# Patient Record
Sex: Female | Born: 1965 | Race: White | Hispanic: No | Marital: Married | Smoking: Never smoker
Health system: Southern US, Community
[De-identification: ages and names within clinical notes are randomized; demographics above are authoritative.]

## PROBLEM LIST (undated history)

## (undated) DIAGNOSIS — E669 Obesity, unspecified: Secondary | ICD-10-CM

## (undated) DIAGNOSIS — F419 Anxiety disorder, unspecified: Secondary | ICD-10-CM

## (undated) DIAGNOSIS — E538 Deficiency of other specified B group vitamins: Secondary | ICD-10-CM

## (undated) DIAGNOSIS — R112 Nausea with vomiting, unspecified: Secondary | ICD-10-CM

## (undated) DIAGNOSIS — K589 Irritable bowel syndrome without diarrhea: Secondary | ICD-10-CM

## (undated) DIAGNOSIS — F32A Depression, unspecified: Secondary | ICD-10-CM

## (undated) DIAGNOSIS — E119 Type 2 diabetes mellitus without complications: Secondary | ICD-10-CM

## (undated) DIAGNOSIS — F329 Major depressive disorder, single episode, unspecified: Secondary | ICD-10-CM

## (undated) DIAGNOSIS — E785 Hyperlipidemia, unspecified: Secondary | ICD-10-CM

## (undated) DIAGNOSIS — G43909 Migraine, unspecified, not intractable, without status migrainosus: Secondary | ICD-10-CM

## (undated) DIAGNOSIS — C52 Malignant neoplasm of vagina: Secondary | ICD-10-CM

## (undated) DIAGNOSIS — I10 Essential (primary) hypertension: Secondary | ICD-10-CM

## (undated) DIAGNOSIS — T7840XA Allergy, unspecified, initial encounter: Secondary | ICD-10-CM

## (undated) DIAGNOSIS — Z923 Personal history of irradiation: Secondary | ICD-10-CM

## (undated) DIAGNOSIS — E559 Vitamin D deficiency, unspecified: Secondary | ICD-10-CM

## (undated) DIAGNOSIS — E739 Lactose intolerance, unspecified: Secondary | ICD-10-CM

## (undated) DIAGNOSIS — R0602 Shortness of breath: Secondary | ICD-10-CM

## (undated) DIAGNOSIS — Z9889 Other specified postprocedural states: Secondary | ICD-10-CM

## (undated) HISTORY — DX: Hyperlipidemia, unspecified: E78.5

## (undated) HISTORY — DX: Type 2 diabetes mellitus without complications: E11.9

## (undated) HISTORY — DX: Irritable bowel syndrome, unspecified: K58.9

## (undated) HISTORY — DX: Depression, unspecified: F32.A

## (undated) HISTORY — DX: Essential (primary) hypertension: I10

## (undated) HISTORY — DX: Deficiency of other specified B group vitamins: E53.8

## (undated) HISTORY — DX: Allergy, unspecified, initial encounter: T78.40XA

## (undated) HISTORY — PX: ABDOMINAL HYSTERECTOMY: SHX81

## (undated) HISTORY — DX: Lactose intolerance, unspecified: E73.9

## (undated) HISTORY — DX: Major depressive disorder, single episode, unspecified: F32.9

## (undated) HISTORY — DX: Vitamin D deficiency, unspecified: E55.9

## (undated) HISTORY — DX: Obesity, unspecified: E66.9

## (undated) HISTORY — DX: Anxiety disorder, unspecified: F41.9

## (undated) HISTORY — PX: KNEE SURGERY: SHX244

## (undated) HISTORY — PX: ESOPHAGOGASTRODUODENOSCOPY: SHX1529

---

## 1997-10-14 ENCOUNTER — Emergency Department (HOSPITAL_COMMUNITY): Admission: EM | Admit: 1997-10-14 | Discharge: 1997-10-14 | Payer: Self-pay | Admitting: Emergency Medicine

## 1997-12-18 ENCOUNTER — Ambulatory Visit (HOSPITAL_COMMUNITY): Admission: RE | Admit: 1997-12-18 | Discharge: 1997-12-18 | Payer: Self-pay | Admitting: Obstetrics & Gynecology

## 2000-02-10 ENCOUNTER — Encounter: Admission: RE | Admit: 2000-02-10 | Discharge: 2000-02-10 | Payer: Self-pay | Admitting: Obstetrics & Gynecology

## 2000-02-10 ENCOUNTER — Encounter: Payer: Self-pay | Admitting: Obstetrics & Gynecology

## 2000-03-02 ENCOUNTER — Observation Stay (HOSPITAL_COMMUNITY): Admission: AD | Admit: 2000-03-02 | Discharge: 2000-03-03 | Payer: Self-pay | Admitting: Obstetrics & Gynecology

## 2000-03-02 ENCOUNTER — Encounter (INDEPENDENT_AMBULATORY_CARE_PROVIDER_SITE_OTHER): Payer: Self-pay

## 2000-06-11 ENCOUNTER — Other Ambulatory Visit: Admission: RE | Admit: 2000-06-11 | Discharge: 2000-06-11 | Payer: Self-pay | Admitting: Family Medicine

## 2001-06-25 ENCOUNTER — Encounter (INDEPENDENT_AMBULATORY_CARE_PROVIDER_SITE_OTHER): Payer: Self-pay | Admitting: Specialist

## 2001-06-25 ENCOUNTER — Observation Stay (HOSPITAL_COMMUNITY): Admission: RE | Admit: 2001-06-25 | Discharge: 2001-06-26 | Payer: Self-pay | Admitting: Obstetrics & Gynecology

## 2001-12-25 ENCOUNTER — Encounter: Admission: RE | Admit: 2001-12-25 | Discharge: 2001-12-25 | Payer: Self-pay | Admitting: *Deleted

## 2001-12-25 ENCOUNTER — Encounter: Payer: Self-pay | Admitting: *Deleted

## 2002-08-12 ENCOUNTER — Encounter: Admission: RE | Admit: 2002-08-12 | Discharge: 2002-08-12 | Payer: Self-pay

## 2002-09-15 ENCOUNTER — Other Ambulatory Visit: Admission: RE | Admit: 2002-09-15 | Discharge: 2002-09-15 | Payer: Self-pay | Admitting: Obstetrics & Gynecology

## 2002-12-08 ENCOUNTER — Encounter: Admission: RE | Admit: 2002-12-08 | Discharge: 2002-12-08 | Payer: Self-pay | Admitting: Family Medicine

## 2002-12-08 ENCOUNTER — Encounter: Payer: Self-pay | Admitting: Family Medicine

## 2003-10-19 ENCOUNTER — Other Ambulatory Visit: Admission: RE | Admit: 2003-10-19 | Discharge: 2003-10-19 | Payer: Self-pay | Admitting: Obstetrics & Gynecology

## 2004-07-18 ENCOUNTER — Ambulatory Visit: Payer: Self-pay | Admitting: Internal Medicine

## 2004-07-25 ENCOUNTER — Ambulatory Visit: Payer: Self-pay | Admitting: Internal Medicine

## 2004-08-08 ENCOUNTER — Ambulatory Visit: Payer: Self-pay

## 2004-10-17 ENCOUNTER — Ambulatory Visit: Payer: Self-pay | Admitting: Internal Medicine

## 2004-11-02 ENCOUNTER — Ambulatory Visit: Payer: Self-pay | Admitting: Cardiology

## 2004-11-02 ENCOUNTER — Ambulatory Visit: Payer: Self-pay | Admitting: Internal Medicine

## 2004-11-14 ENCOUNTER — Other Ambulatory Visit: Admission: RE | Admit: 2004-11-14 | Discharge: 2004-11-14 | Payer: Self-pay | Admitting: Obstetrics & Gynecology

## 2004-11-21 ENCOUNTER — Encounter: Admission: RE | Admit: 2004-11-21 | Discharge: 2004-11-21 | Payer: Self-pay | Admitting: Obstetrics & Gynecology

## 2005-03-13 ENCOUNTER — Ambulatory Visit: Payer: Self-pay | Admitting: Internal Medicine

## 2005-04-06 ENCOUNTER — Ambulatory Visit: Payer: Self-pay | Admitting: Internal Medicine

## 2005-04-17 ENCOUNTER — Ambulatory Visit: Payer: Self-pay | Admitting: Cardiology

## 2005-08-14 ENCOUNTER — Emergency Department (HOSPITAL_COMMUNITY): Admission: EM | Admit: 2005-08-14 | Discharge: 2005-08-14 | Payer: Self-pay | Admitting: Emergency Medicine

## 2005-08-22 ENCOUNTER — Ambulatory Visit: Payer: Self-pay | Admitting: Internal Medicine

## 2005-12-18 ENCOUNTER — Ambulatory Visit: Payer: Self-pay | Admitting: Internal Medicine

## 2006-03-19 ENCOUNTER — Ambulatory Visit: Payer: Self-pay | Admitting: Internal Medicine

## 2006-08-29 ENCOUNTER — Encounter: Admission: RE | Admit: 2006-08-29 | Discharge: 2006-08-29 | Payer: Self-pay | Admitting: Gastroenterology

## 2006-10-09 ENCOUNTER — Ambulatory Visit (HOSPITAL_COMMUNITY): Admission: RE | Admit: 2006-10-09 | Discharge: 2006-10-09 | Payer: Self-pay | Admitting: Obstetrics & Gynecology

## 2007-08-16 ENCOUNTER — Encounter: Payer: Self-pay | Admitting: Internal Medicine

## 2007-11-11 ENCOUNTER — Encounter: Payer: Self-pay | Admitting: Internal Medicine

## 2008-11-02 ENCOUNTER — Encounter: Admission: RE | Admit: 2008-11-02 | Discharge: 2008-11-02 | Payer: Self-pay | Admitting: Obstetrics & Gynecology

## 2008-11-04 ENCOUNTER — Encounter: Payer: Self-pay | Admitting: Internal Medicine

## 2009-03-19 ENCOUNTER — Telehealth (INDEPENDENT_AMBULATORY_CARE_PROVIDER_SITE_OTHER): Payer: Self-pay | Admitting: *Deleted

## 2009-06-11 ENCOUNTER — Telehealth: Payer: Self-pay | Admitting: Internal Medicine

## 2009-06-29 ENCOUNTER — Encounter: Payer: Self-pay | Admitting: Internal Medicine

## 2009-06-29 ENCOUNTER — Ambulatory Visit: Payer: Self-pay | Admitting: Internal Medicine

## 2009-06-29 DIAGNOSIS — E669 Obesity, unspecified: Secondary | ICD-10-CM

## 2009-06-29 DIAGNOSIS — R0602 Shortness of breath: Secondary | ICD-10-CM

## 2009-06-29 DIAGNOSIS — D179 Benign lipomatous neoplasm, unspecified: Secondary | ICD-10-CM | POA: Insufficient documentation

## 2009-06-29 DIAGNOSIS — F4321 Adjustment disorder with depressed mood: Secondary | ICD-10-CM

## 2009-06-29 DIAGNOSIS — G43909 Migraine, unspecified, not intractable, without status migrainosus: Secondary | ICD-10-CM

## 2009-06-30 DIAGNOSIS — F411 Generalized anxiety disorder: Secondary | ICD-10-CM

## 2009-06-30 DIAGNOSIS — F419 Anxiety disorder, unspecified: Secondary | ICD-10-CM | POA: Insufficient documentation

## 2009-06-30 DIAGNOSIS — F329 Major depressive disorder, single episode, unspecified: Secondary | ICD-10-CM

## 2009-07-09 ENCOUNTER — Ambulatory Visit: Payer: Self-pay | Admitting: Internal Medicine

## 2009-07-09 LAB — CONVERTED CEMR LAB
ALT: 33 units/L (ref 0–35)
Alkaline Phosphatase: 58 units/L (ref 39–117)
Basophils Relative: 0.3 % (ref 0.0–3.0)
Bilirubin, Direct: 0.1 mg/dL (ref 0.0–0.3)
Chloride: 107 meq/L (ref 96–112)
Eosinophils Absolute: 0 10*3/uL (ref 0.0–0.7)
GFR calc non Af Amer: 95.87 mL/min (ref >60)
HCT: 40.6 % (ref 36.0–46.0)
Hemoglobin: 13.4 g/dL (ref 12.0–15.0)
Leukocytes, UA: NEGATIVE
Lymphocytes Relative: 26.8 % (ref 12.0–46.0)
MCHC: 33 g/dL (ref 30.0–36.0)
MCV: 93 fL (ref 78.0–100.0)
Monocytes Absolute: 0.4 10*3/uL (ref 0.1–1.0)
Neutrophils Relative %: 65.8 % (ref 43.0–77.0)
Nitrite: NEGATIVE
Platelets: 197 10*3/uL (ref 150.0–400.0)
RBC: 4.36 M/uL (ref 3.87–5.11)
RDW: 12 % (ref 11.5–14.6)
Sodium: 137 meq/L (ref 135–145)
TSH: 1.18 microintl units/mL (ref 0.35–5.50)
Total Bilirubin: 0.5 mg/dL (ref 0.3–1.2)
Total CHOL/HDL Ratio: 4
Total Protein: 7.3 g/dL (ref 6.0–8.3)
Urine Glucose: NEGATIVE mg/dL
WBC: 5.6 10*3/uL (ref 4.5–10.5)

## 2009-08-12 ENCOUNTER — Telehealth: Payer: Self-pay | Admitting: Internal Medicine

## 2009-08-25 ENCOUNTER — Ambulatory Visit: Payer: Self-pay | Admitting: Internal Medicine

## 2009-09-06 ENCOUNTER — Encounter: Payer: Self-pay | Admitting: Internal Medicine

## 2009-09-06 HISTORY — PX: NM MYOCAR PERF WALL MOTION: HXRAD629

## 2009-12-02 ENCOUNTER — Ambulatory Visit: Payer: Self-pay | Admitting: Internal Medicine

## 2009-12-02 DIAGNOSIS — J45909 Unspecified asthma, uncomplicated: Secondary | ICD-10-CM

## 2009-12-02 DIAGNOSIS — J309 Allergic rhinitis, unspecified: Secondary | ICD-10-CM | POA: Insufficient documentation

## 2010-01-27 ENCOUNTER — Telehealth: Payer: Self-pay | Admitting: Internal Medicine

## 2010-02-11 ENCOUNTER — Encounter: Admission: RE | Admit: 2010-02-11 | Discharge: 2010-02-11 | Payer: Self-pay | Admitting: Obstetrics & Gynecology

## 2010-03-25 ENCOUNTER — Telehealth: Payer: Self-pay | Admitting: Internal Medicine

## 2010-03-29 ENCOUNTER — Telehealth: Payer: Self-pay | Admitting: Internal Medicine

## 2010-04-18 ENCOUNTER — Encounter: Payer: Self-pay | Admitting: Internal Medicine

## 2010-05-16 ENCOUNTER — Ambulatory Visit: Payer: Self-pay | Admitting: Internal Medicine

## 2010-05-20 ENCOUNTER — Telehealth: Payer: Self-pay | Admitting: Internal Medicine

## 2010-05-31 ENCOUNTER — Telehealth: Payer: Self-pay | Admitting: Internal Medicine

## 2010-06-19 ENCOUNTER — Encounter: Payer: Self-pay | Admitting: Orthopedic Surgery

## 2010-06-30 NOTE — Progress Notes (Signed)
Summary: REQ FOR ABX  Phone Note Call from Patient Call back at Home Phone [phone]   Summary of Call: Pt "always gets a sinus infection" this time of year. She has no tranportation to the office. Patient is requesting rx for levaquin.  Initial call taken by: Lamar Sprinkles, CMA,  August 12, 2009 9:14 AM  Follow-up for Phone Call        ok OV if too sick Follow-up by: Tresa Garter MD,  August 12, 2009 1:09 PM  Additional Follow-up for Phone Call Additional follow up Details #1::        pt callled and wants to know status - if prescription was sent to her pharmacy - pls let her know. Additional Follow-up by: Verdell Face,  August 12, 2009 3:45 PM    Additional Follow-up for Phone Call Additional follow up Details #2::    Pt informed  Follow-up by: Lamar Sprinkles, CMA,  August 12, 2009 4:48 PM  New/Updated Medications: LEVAQUIN 500 MG TABS (LEVOFLOXACIN) 1 by mouth qd Prescriptions: LEVAQUIN 500 MG TABS (LEVOFLOXACIN) 1 by mouth qd  #10 x 0   Entered and Authorized by:   Tresa Garter MD   Signed by:   Lamar Sprinkles, CMA on 08/12/2009   Method used:   [facility]        [facility] [address] [phone]* (retail)       [address]       [place], [place]  [address]       Ph: [phone] or [phone]       Fax: [phone]   RxID:   [id number]

## 2010-06-30 NOTE — Assessment & Plan Note (Signed)
Summary: sinus inf?/plot pt/cd   Vital Signs:  Patient profile:   45 year old female Height:      60.5 inches Weight:      185.38 pounds BMI:     35.74 O2 Sat:      97 % on Room air Temp:     98.4 degrees F oral Pulse rate:   72 / minute BP sitting:   120 / 70  (left arm) Cuff size:   regular  Vitals Entered By: Zella Ball Ewing CMA Duncan Dull) (May 16, 2010 4:07 PM)  O2 Flow:  Room air CC: Congestion, ear and throat pain/RE   CC:  Congestion and ear and throat pain/RE.  History of Present Illness: here with actue onset 3 days mild to mod facial pain, pressure, fever and greenish d/c ; with mild ST and non prod cough and right earache without hearing loss, vertigo;  Pt denies CP, worsening sob, doe, wheezing, orthopnea, pnd, worsening LE edema, palps, dizziness or syncope  Pt denies new neuro symptoms such as headache, facial or extremity weakness  Pt denies polydipsia, polyuria   Overall good compliance with meds, trying to follow low chol diet, wt stable, little excercise however    Problems Prior to Update: 1)  Sinusitis- Acute-nos  (ICD-461.9) 2)  Wheezing  (ICD-786.07) 3)  Asthma  (ICD-493.90) 4)  Allergic Rhinitis  (ICD-477.9) 5)  Sinusitis- Acute-nos  (ICD-461.9) 6)  Depression  (ICD-311) 7)  Anxiety  (ICD-300.00) 8)  Lipomas, 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 1 Three Times A Day As Needed 2)  Wellbutrin 100 Mg Tabs (Bupropion Hcl) .... Once Daily 3)  Effexor Xr 150 Mg Xr24h-Cap (Venlafaxine Hcl) .... Two Times A Day 4)  Topamax 200 Mg Tabs (Topiramate) .... Take 1 Tab By Mouth At Bedtime 5)  Avelox 400 Mg Tabs (Moxifloxacin Hcl) .[name].. 1 By Mouth Once Daily 6)  Hydrocodone-Homatropine 5-1.5 Mg/7ml Syrp (Hydrocodone-Homatropine) .[name].. 1 Tsp By Mouth Q 6 Hrs As Needed 7)  Prednisone 10 Mg  Tabs (Prednisone) .... 3po Qd For 3days, Then 2po Qd For 3days, Then 1po Qd For 3days, Then Stop 8)  Proair Hfa 108 (90 Base) Mcg/act Aers (Albuterol Sulfate) .... 2 Puffs Four Times Per Day As Needed  Current Medications (verified): 1)  Alprazolam 0.5 Mg Tabs (Alprazolam) .[name].. 1 Three Times A Day As Needed 2)  Wellbutrin 100 Mg Tabs (Bupropion Hcl) .... Once Daily 3)  Effexor Xr 150 Mg Xr24h-Cap (Venlafaxine Hcl) .... Two Times A Day 4)  Topamax 200 Mg Tabs (Topiramate) .... Take 1 Tab By Mouth At Bedtime 5)  Hydrocodone-Homatropine 5-1.5 Mg/28ml Syrp (Hydrocodone-Homatropine) .[name].. 1 Tsp By Mouth Q 6 Hrs As Needed 6)  Proair Hfa 108 (90 Base) Mcg/act Aers (Albuterol Sulfate) .... 2 Puffs Four Times Per Day As Needed 7)  Levofloxacin 500 Mg Tabs (Levofloxacin) .[name].. 1po Once Daily  Allergies (verified): 1)  ! Penicillin V Potassium (Penicillin V Potassium) 2)  ! Sulfadiazine (Sulfadiazine) 3)  ! Codeine Sulfate (Codeine Sulfate)  Past History:  Past Medical History: Last updated: 12/02/2009 Migraines Obesity Anxiety Depression Allergic rhinitis Asthma  Social History: Last updated: 12/02/2009 Occupation:hair dresser Married Never Smoked Regular exercise-no Drug use-no  Risk Factors: Exercise: no (06/29/2009)  Risk Factors: Smoking Status: never (06/29/2009)  Review of Systems  all otherwise negative per pt -    Physical Exam  General:  alert and overweight-appearing., mild ill    Head:  normocephalic and atraumatic.   Eyes:  vision grossly intact, pupils equal, and pupils round.   Ears:  left tm mild erythema, but right tm normal TM appearance, sinus tender bilat Nose:  nasal dischargemucosal pallor and mucosal edema.   Mouth:  pharyngeal erythema and fair dentition.   Neck:  no masses.  supple.   Lungs:  normal respiratory effort and normal breath sounds.   Heart:  normal rate and regular rhythm.   Extremities:  no edema, no erythema    Impression &  Recommendations:  Problem # 1:  SINUSITIS- ACUTE-NOS (ICD-461.9)  The following medications were removed from the medication list:    Avelox 400 Mg Tabs (Moxifloxacin hcl) .[name]... 1 by mouth once daily Her updated medication list for this problem includes:    Hydrocodone-homatropine 5-1.5 Mg/44ml Syrp (Hydrocodone-homatropine) .[name]... 1 tsp by mouth q 6 hrs as needed    Levofloxacin 500 Mg Tabs (Levofloxacin) .[name]... 1po once daily no wheezing today; treat as above, f/u any worsening signs or symptoms   Complete Medication List: 1)  Alprazolam 0.5 Mg Tabs (Alprazolam) .[name].. 1 three times a day as needed 2)  Wellbutrin 100 Mg Tabs (Bupropion hcl) .... Once daily 3)  Effexor Xr 150 Mg Xr24h-cap (Venlafaxine hcl) .... Two times a day 4)  Topamax 200 Mg Tabs (Topiramate) .... Take 1 tab by mouth at bedtime 5)  Hydrocodone-homatropine 5-1.5 Mg/44ml Syrp (Hydrocodone-homatropine) .[name].. 1 tsp by mouth q 6 hrs as needed 6)  Proair Hfa 108 (90 Base) Mcg/act Aers (Albuterol sulfate) .... 2 puffs four times per day as needed 7)  Levofloxacin 500 Mg Tabs (Levofloxacin) .[name].. 1po once daily  Patient Instructions: 1)  Please take all new medications as prescribed  2)  Continue all previous medications as before this visit  3)  Please schedule an appointment with your primary doctor as needed Prescriptions: LEVOFLOXACIN 500 MG TABS (LEVOFLOXACIN) 1po once daily  #10 x 0   Entered and Authorized by:   Corwin Levins MD   Signed by:   Corwin Levins MD on 05/16/2010   Method used:   Print then Give to Patient   RxID:   [id number] HYDROCODONE-HOMATROPINE 5-1.5 MG/5ML SYRP (HYDROCODONE-HOMATROPINE) 1 tsp by mouth q 6 hrs as needed  #6oz x 1   Entered and Authorized by:   Corwin Levins MD   Signed by:   Corwin Levins MD on 05/16/2010   Method used:   Print then Give to Patient   RxID:   [id number]    Orders Added: 1)  Est. Patient Level III [[address]073]

## 2010-06-30 NOTE — Progress Notes (Signed)
Summary: REQ FOR RX  Phone Note Call from Patient Call back at Work Phone [phone]   Summary of Call: Pt continues to have nasal drainage, sinus congestion, right ear pain and feels she is not over what she was treated for by Dr Jonny Ruiz. Pt was given levaquin previously.  Initial call taken by: Lamar Sprinkles, CMA,  May 31, 2010 3:20 PM  Follow-up for Phone Call        OK to refill Levaquin. OV if not better Follow-up by: Tresa Garter MD,  May 31, 2010 9:24 PM  Additional Follow-up for Phone Call Additional follow up Details #1::        Returned call to pt/ lmovm to check pharmacy and call for office visit if not better.Alvy Beal Archie CMA  June 01, 2010 9:02 AM     Prescriptions: LEVOFLOXACIN 500 MG TABS (LEVOFLOXACIN) 1po once daily  #10 x 0   Entered by:   Rock Nephew CMA   Authorized by:   Tresa Garter MD   Signed by:   Rock Nephew CMA on 06/01/2010   Method used:   Electronically to        [facility] [address] [phone]* (retail)       [address]       [place], [place]  [address]       Ph: [phone] or [phone]       Fax: [phone]   RxID:   [id number]

## 2010-06-30 NOTE — Progress Notes (Signed)
Summary: REQ FOR RX   Phone Note Call from Patient Call back at Work Phone [phone]   Summary of Call: Pt is going out of town tomorrow. She is c/o runny nose, sinus congestion and ear ache. Patient is requesting rx for what she thinks is a sinus infection. Patient is requesting antibiotic.  Initial call taken by: Lamar Sprinkles, CMA,  March 25, 2010 9:51 AM  Follow-up for Phone Call        ok zpac Follow-up by: Tresa Garter MD,  March 25, 2010 1:04 PM  Additional Follow-up for Phone Call Additional follow up Details #1::        Left detailed vm on pt's home # Additional Follow-up by: Lamar Sprinkles, CMA,  March 25, 2010 6:13 PM    New/Updated Medications: ZITHROMAX Z-PAK 250 MG TABS (AZITHROMYCIN) as dirrected Prescriptions: ZITHROMAX Z-PAK 250 MG TABS (AZITHROMYCIN) as dirrected  #1 x 0   Entered and Authorized by:   Tresa Garter MD   Signed by:   Lamar Sprinkles, CMA on 03/25/2010   Method used:   Electronically to        [facility] [address] [phone]* (retail)       [address]       [place], [place]  [address]       Ph: [phone] or [phone]       Fax: [phone]   RxID:   [id number]

## 2010-06-30 NOTE — Progress Notes (Signed)
Summary: sinus infection  Phone Note Call from Patient   Caller: Patient Summary of Call: Pt was given Rx for Zpak for her sinus infection. Sinus infection has not gotten any better, has gotten worse. Wants something stronger. Stated she has been on Levoquin before for sinus infection. Please advise. Callback on [phone]. Initial call taken by: Alysia Penna,  March 29, 2010 3:04 PM  Follow-up for Phone Call        needs ov with any MD Follow-up by: Tresa Garter MD,  March 29, 2010 5:13 PM  Additional Follow-up for Phone Call Additional follow up Details #1::        Pt advised via VM that re-eval is needed if sxs have gotten worse and to call back and sch appt with AVP or any available MD if okay. Additional Follow-up by: Margaret Pyle, CMA,  March 30, 2010 8:27 AM

## 2010-06-30 NOTE — Letter (Signed)
Summary: [facility]   Imported By: Sherian Rein 05/27/2010 08:15:16  _____________________________________________________________________  External Attachment:    Type:   Image     Comment:   External Document

## 2010-06-30 NOTE — Progress Notes (Signed)
Summary: Rx req  Phone Note Call from Patient Call back at [facility] Phone [phone] Call back at Work Phone [phone]   Caller: Patient Summary of Call: Pt called stating that she does not feel completely better, still with sinus pain and cough. Pt is requesting Rx for Pred Pak MD suggested at OV. [facility] Initial call taken by: Margaret Pyle, CMA,  May 20, 2010 10:21 AM  Follow-up for Phone Call        ok - done per emr Follow-up by: Corwin Levins MD,  May 20, 2010 10:50 AM  Additional Follow-up for Phone Call Additional follow up Details #1::        Pt advised Additional Follow-up by: Margaret Pyle, CMA,  May 20, 2010 10:56 AM    New/Updated Medications: PREDNISONE 10 MG TABS (PREDNISONE) 3po qd for 3days, then 2po qd for 3days, then 1po qd for 3days, then stop Prescriptions: PREDNISONE 10 MG TABS (PREDNISONE) 3po qd for 3days, then 2po qd for 3days, then 1po qd for 3days, then stop  #18 x 0   Entered and Authorized by:   Corwin Levins MD   Signed by:   Corwin Levins MD on 05/20/2010   Method used:   Electronically to        [facility] [address] [phone]* (retail)       [address]       [place], [place]  [address]       Ph: [phone] or [phone]       Fax: [phone]   RxID:   [id number]

## 2010-06-30 NOTE — Assessment & Plan Note (Signed)
Summary: congestion,head pressure,sore throat/plot/cd   Vital Signs:  Patient profile:   45 year old female Height:      61 inches Weight:      178 pounds BMI:     33.75 O2 Sat:      98 % on Room air Temp:     97.8 degrees F oral Pulse rate:   72 / minute BP sitting:   110 / 66  (left arm) Cuff size:   regular  Vitals Entered ByZella Ball Ewing (August 25, 2009 3:55 PM)  O2 Flow:  Room air CC: sore throat, ear pain, congestion, cough/RE   CC:  sore throat, ear pain, congestion, and cough/RE.  History of Present Illness: here with acute onset x 2 -3 days, with fever, facial pain, pressure and greenish d/c expecially left maxi area, with headache, myalgias, ear pressure bilat and mild ST but Pt denies CP, sob, doe, wheezing, orthopnea, pnd, worsening LE edema, palps, dizziness or syncope   All despite 10 days levaquin recently - seemed better for a while, then quite worse again in the past few days.  Has exposure to 2 relatives sick with strep recently as well.    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 1 Three Times A Day As Needed 2)  Wellbutrin 100 Mg Tabs (Bupropion Hcl) .... Once Daily 3)  Effexor Xr 150 Mg Xr24h-Cap (Venlafaxine Hcl) .... Two Times A Day 4)  Topamax 200 Mg Tabs (Topiramate) 5)  Levaquin 500 Mg Tabs (Levofloxacin) .[name].. 1 By Mouth Qd  Current Medications (verified): 1)  Alprazolam 0.5 Mg Tabs (Alprazolam) .[name].. 1 Three Times A Day As Needed 2)  Wellbutrin 100 Mg Tabs (Bupropion Hcl) .... Once Daily 3)  Effexor Xr 150 Mg Xr24h-Cap (Venlafaxine Hcl) .... Two Times A Day 4)  Topamax 200 Mg Tabs (Topiramate) 5)  Doxycycline Hyclate 100 Mg Caps (Doxycycline Hyclate) .[name].. 1po Two Times A  Day  Allergies (verified): 1)  ! Penicillin V Potassium (Penicillin V Potassium) 2)  ! Sulfadiazine (Sulfadiazine) 3)  ! 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  , mild ill  Head:  normocephalic and atraumatic.   Eyes:  vision grossly intact, pupils equal, and pupils round.   Ears:  right tm mild red, left tm mod erythema, sinus tender bilat with severe tedner left max area Nose:  nasal dischargemucosal pallor and mucosal edema.   Mouth:  pharyngeal erythema and fair dentition.   Neck:  supple and cervical lymphadenopathy.   Lungs:  normal respiratory effort and normal breath sounds.   Heart:  normal rate and regular rhythm.   Extremities:  no edema, no erythema    Impression & Recommendations:  Problem # 1:  SINUSITIS- ACUTE-NOS (ICD-461.9)  Her updated medication list for this problem includes:    Doxycycline Hyclate 100 Mg Caps (Doxycycline hyclate) .[name]... 1po  two times a day treat as above, f/u any worsening signs or symptoms   Complete Medication List: 1)  Alprazolam 0.5 Mg Tabs (Alprazolam) .[name].. 1 three times a day as needed 2)  Wellbutrin 100 Mg Tabs (Bupropion hcl) .... Once daily 3)  Effexor Xr 150 Mg Xr24h-cap (Venlafaxine hcl) .... Two times a day 4)  Topamax 200 Mg Tabs (Topiramate) 5)  Doxycycline Hyclate 100 Mg Caps (Doxycycline hyclate) .[name].. 1po two times a day  Patient Instructions: 1)  Please take all new medications as prescribed 2)  Continue all previous medications as before this visit  3)  You can also use Mucinex OTC or it's  generic for congestion  4)  Please schedule an appointment with your primary doctor as needed Prescriptions: DOXYCYCLINE HYCLATE 100 MG CAPS (DOXYCYCLINE HYCLATE) 1po two times a day  #20 x 0   Entered and Authorized by:   Corwin Levins MD   Signed by:   Corwin Levins MD on 08/25/2009   Method used:   Print then Give to Patient   RxID:   [id number]

## 2010-06-30 NOTE — Progress Notes (Signed)
Summary: SOMETHING FOR ANXIETY  Phone Note Call from Patient Call back at [facility] Phone [phone]   Caller: Patient Summary of Call: Tashema IS JACKIE BREWER'S DAUGHTER.  LOV 02-2006.  SHE IS VERY UPSET ABOUT HER MOTHERS PASSING AND IS REQUESTING SOMETHING FOR ANXIETY.  CALL HER ON HER HUSBAND'S PHONE: [phone] Initial call taken by: Hilarie Fredrickson,  June 11, 2009 3:09 PM  Follow-up for Phone Call        ok xanax per dr, Pt informed  Follow-up by: Lamar Sprinkles, CMA,  June 11, 2009 5:18 PM  Additional Follow-up for Phone Call Additional follow up Details #1::        I called her last Fri Additional Follow-up by: Tresa Garter MD,  June 14, 2009 5:31 PM    New/Updated Medications: ALPRAZOLAM 0.5 MG TABS (ALPRAZOLAM) 1 three times a day as needed Prescriptions: ALPRAZOLAM 0.5 MG TABS (ALPRAZOLAM) 1 three times a day as needed  #60 x 1   Entered by:   Lamar Sprinkles, CMA   Authorized by:   Tresa Garter MD   Signed by:   Lamar Sprinkles, CMA on 06/11/2009   Method used:   Telephoned to ...       [facility] [address] [phone]* (retail)       [address]       [place], [place]  [address]       Ph: [phone] or [phone]       Fax: [phone]   RxID:   [id number]

## 2010-06-30 NOTE — Progress Notes (Signed)
Summary: [facility]  Phone Note Call from Patient Call back at Home Phone [phone]   Summary of Call: Patient is requesting rx's for topamax 200mg  1 at bedtime and effexor. [facility]. Would Dr Posey Rea manage this and fill rx's for pt?  Initial call taken by: Lamar Sprinkles, CMA,  January 27, 2010 11:51 AM  Follow-up for Phone Call        ok x 6 on both Follow-up by: Tresa Garter MD,  January 27, 2010 12:53 PM  Additional Follow-up for Phone Call Additional follow up Details #1::        LMVM to notify pt to check pharmacy.Marland KitchenMarland KitchenAlvy Beal Archie CMA  January 27, 2010 2:15 PM     New/Updated Medications: TOPAMAX 200 MG TABS (TOPIRAMATE) Take 1 tab by mouth at bedtime Prescriptions: TOPAMAX 200 MG TABS (TOPIRAMATE) Take 1 tab by mouth at bedtime  #30 x 5   Entered by:   Rock Nephew CMA   Authorized by:   Tresa Garter MD   Signed by:   Rock Nephew CMA on 01/27/2010   Method used:   Electronically to        [facility] [address] [phone]* (retail)       [address]       [place], [place]  [address]       Ph: [phone] or [phone]       Fax: [phone]   RxID:   [id number] EFFEXOR XR 150 MG XR24H-CAP (VENLAFAXINE HCL) two times a day  #60 x 5   Entered by:   Rock Nephew CMA   Authorized by:   Tresa Garter MD   Signed by:   Rock Nephew CMA on 01/27/2010   Method used:   Electronically to        [facility] [address] [phone]* (retail)       [address]       [place], [place]  [address]       Ph: [phone] or [phone]       Fax: [phone]   RxID:   [id number]

## 2010-06-30 NOTE — Assessment & Plan Note (Signed)
Summary: SINUS/NWS   Vital Signs:  Patient profile:   45 year old female Height:      61 inches Weight:      182.50 pounds BMI:     34.61 O2 Sat:      98 % on Room air Temp:     98.4 degrees F oral Pulse rate:   75 / minute BP sitting:   110 / 78  (left arm) Cuff size:   regular  Vitals Entered By: Zella Ball Ewing CMA (AAMA) (December 02, 2009 12:01 PM)  O2 Flow:  Room air CC: Chest and head congestion, ear pain, chills, fever/RE   CC:  Chest and head congestion, ear pain, chills, and fever/RE.  History of Present Illness: here with acute onset  x 3 days facial pain, pressure, fever and greenish d/c;  and this am with onset mild non prod cough with wheezing and mild sob;  Pt denies CP, doe, orthopnea, pnd, worsening LE edema, palps, dizziness or syncope  Pt denies new neuro symptoms such as headache, facial or extremity weakness No  wt loss, night sweats, loss of appetite or other constitutional symptoms . Has also had several weeeks of nasal mild allergy symtpoms without fever, pain, with mostly clearish d/c.  Not improving with OTC mucinx and advil.  Preventive Screening-Counseling & Management      Drug Use:  no.    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 1 Three Times A Day As Needed 2)  Wellbutrin 100 Mg Tabs (Bupropion Hcl) .... Once Daily 3)  Effexor Xr 150 Mg Xr24h-Cap (Venlafaxine Hcl) .... Two Times A Day 4)  Topamax 200 Mg Tabs (Topiramate) 5)  Doxycycline Hyclate 100 Mg Caps (Doxycycline Hyclate) .[name].. 1po Two Times A Day  Current Medications (verified): 1)  Alprazolam 0.5 Mg  Tabs (Alprazolam) .[name].. 1 Three Times A Day As Needed 2)  Wellbutrin 100 Mg Tabs (Bupropion Hcl) .... Once Daily 3)  Effexor Xr 150 Mg Xr24h-Cap (Venlafaxine Hcl) .... Two Times A Day 4)  Topamax 200 Mg Tabs (Topiramate) 5)  Avelox 400 Mg Tabs (Moxifloxacin Hcl) .[name].. 1 By Mouth Once Daily 6)  Hydrocodone-Homatropine 5-1.5 Mg/49ml Syrp (Hydrocodone-Homatropine) .[name].. 1 Tsp By Mouth Q 6 Hrs As Needed 7)  Prednisone 10 Mg Tabs (Prednisone) .... 3po Qd For 3days, Then 2po Qd For 3days, Then 1po Qd For 3days, Then Stop 8)  Proair Hfa 108 (90 Base) Mcg/act Aers (Albuterol Sulfate) .... 2 Puffs Four Times Per Day As Needed  Allergies (verified): 1)  ! Penicillin V Potassium (Penicillin V Potassium) 2)  ! Sulfadiazine (Sulfadiazine) 3)  ! Codeine Sulfate (Codeine Sulfate)  Past History:  Social History: Last updated: 12/02/2009 Occupation:hair dresser Married Never Smoked Regular exercise-no Drug use-no  Risk Factors: Exercise: no (06/29/2009)  Risk Factors: Smoking Status: never (06/29/2009)  Past Medical History: Migraines Obesity Anxiety Depression Allergic rhinitis Asthma  Social History: Reviewed history from 06/29/2009 and no changes required. Occupation:hair dresser Married Never Smoked Regular exercise-no Drug use-no Drug Use:  no  Review of Systems       all otherwise  negative per pt -    Physical Exam  General:  alert and overweight-appearing.  ,mild ill  Head:  normocephalic and atraumatic.   Eyes:  vision grossly intact, pupils equal, and pupils round.   Ears:  bilat tm's midl red, sinus tender bilat Nose:  nasal dischargemucosal pallor and mucosal edema.   Mouth:  pharyngeal erythema and fair dentition.   Neck:  no masses.   Lungs:  normal respiratory effort, R decreased breath sounds, R wheezes, L decreased breath sounds, and L wheezes.   Heart:  normal rate and regular rhythm.   Extremities:  no edema, no erythema    Impression &  Recommendations:  Problem # 1:  SINUSITIS- ACUTE-NOS (ICD-461.9)  Her updated medication list for this problem includes:    Avelox 400 Mg Tabs (Moxifloxacin hcl) .[name]... 1 by mouth once daily    Hydrocodone-homatropine 5-1.5 Mg/8ml Syrp (Hydrocodone-homatropine) .[name]... 1 tsp by mouth q 6 hrs as needed treat as above, f/u any worsening signs or symptoms   Orders: Depo- Medrol 40mg  (J1030) Depo- Medrol 80mg  (J1040) Admin of Therapeutic Inj  intramuscular or subcutaneous (16109)  Problem # 2:  WHEEZING (ICD-786.07) for depo IM today, pred pack, and inhaler, cough med  Problem # 3:  ALLERGIC RHINITIS (ICD-477.9) for otc allegra as needed   Complete Medication List: 1)  Alprazolam 0.5 Mg Tabs (Alprazolam) .[name].. 1 three times a day as needed 2)  Wellbutrin 100 Mg Tabs (Bupropion hcl) .... Once daily 3)  Effexor Xr 150 Mg Xr24h-cap (Venlafaxine hcl) .... Two times a day 4)  Topamax 200 Mg Tabs (Topiramate) 5)  Avelox 400 Mg Tabs (Moxifloxacin hcl) .[name].. 1 by mouth once daily 6)  Hydrocodone-homatropine 5-1.5 Mg/24ml Syrp (Hydrocodone-homatropine) .[name].. 1 tsp by mouth q 6 hrs as needed 7)  Prednisone 10 Mg Tabs (Prednisone) .... 3po qd for 3days, then 2po qd for 3days, then 1po qd for 3days, then stop 8)  Proair Hfa 108 (90 Base) Mcg/act Aers (Albuterol sulfate) .... 2 puffs four times per day as needed   Patient Instructions: 1)  you had the steroid shot today 2)  Please take all new medications as prescribed - the antibiotic, cough med, inhaler and prednisone 3)  Continue all previous medications as before this visit  4)  You can also use OTC allegra as needed for the allergies 5)  Please schedule an appointment with your primary doctor as needed Prescriptions: PROAIR HFA 108 (90 BASE) MCG/ACT AERS (ALBUTEROL SULFATE) 2 puffs four times per day as needed  #1 x 1   Entered and Authorized by:   Corwin Levins MD   Signed by:   Corwin Levins MD on 12/02/2009   Method used:   Print then Give to  Patient   RxID:   [id number] PREDNISONE 10 MG TABS (PREDNISONE) 3po qd for 3days, then 2po qd for 3days, then 1po qd for 3days, then stop  #18 x 0   Entered and Authorized by:   Corwin Levins MD   Signed by:   Corwin Levins MD on 12/02/2009   Method used:   Print then Give to Patient   RxID:   [id number] HYDROCODONE-HOMATROPINE 5-1.5 MG/5ML SYRP (HYDROCODONE-HOMATROPINE) 1 tsp by mouth q 6 hrs as needed  #6 oz x 1   Entered and Authorized by:   Corwin Levins MD   Signed by:   Corwin Levins MD on 12/02/2009   Method used:   Print then Give to Patient  RxID:   [id number] AVELOX 400 MG TABS (MOXIFLOXACIN HCL) 1 by mouth once daily  #10 x 0   Entered and Authorized by:   Corwin Levins MD   Signed by:   Corwin Levins MD on 12/02/2009   Method used:   Print then Give to Patient   RxID:   [id number]    Medication Administration  Injection # 1:    Medication: Depo- Medrol 40mg     Diagnosis: SINUSITIS- ACUTE-NOS (ICD-461.9)    Route: IM    Site: LUOQ gluteus    Exp Date: 08/27/2012    Lot #: [id number]    Mfr: [employer]    Comments: pt rec 120mg      Patient tolerated injection without complications    Given by: Lanier Prude, CMA(AAMA) (December 02, 2009 12:53 PM)  Injection # 2:    Medication: Depo- Medrol 80mg     Diagnosis: SINUSITIS- ACUTE-NOS (ICD-461.9)    Route: IM    Site: LUOQ gluteus    Exp Date: 08/27/2012    Comments: same as above    Patient tolerated injection without complications    Given by: Lanier Prude, [facility]) (December 02, 2009 12:54 PM)  Orders Added: 1)  Depo- Medrol 40mg  [J1030] 2)  Depo- Medrol 80mg  [J1040] 3)  Admin of Therapeutic Inj  intramuscular or subcutaneous [96372] 4)  Est. Patient Level IV [32440]

## 2010-06-30 NOTE — Assessment & Plan Note (Signed)
Summary: KNOTS IN STOMACH/ [employer] /CD   Vital Signs:  Patient profile:   45 year old female Height:      61 inches Weight:      179 pounds BMI:     33.94 Temp:     98.5 degrees F oral Pulse rate:   84 / minute BP sitting:   130 / 74  (left arm)  Vitals Entered By: Tora Perches (June 29, 2009 5:03 PM) CC: knots in stomach Is Patient Diabetic? No   CC:  knots in stomach.  History of Present Illness: C/o lumps on B sides of her chest. F/u on grief - mother just died. She is worried abot the possibility of heart disease. SOB w/exertion.  Preventive Screening-Counseling & Management  Alcohol-Tobacco     Smoking Status: never  Caffeine-Diet-Exercise     Does Patient Exercise: no  Current Medications (verified): 1)  Alprazolam 0.5 Mg Tabs (Alprazolam) .[name].. 1 Three Times A Day As Needed 2)  Wellbutrin 100 Mg Tabs (Bupropion Hcl) .... Once Daily 3)  Effexor Xr 150 Mg Xr24h-Cap (Venlafaxine Hcl) .... Two Times A Day 4)  Topamax 200 Mg Tabs (Topiramate)  Allergies (verified): 1)  ! Penicillin V Potassium (Penicillin V Potassium) 2)  ! Sulfadiazine (Sulfadiazine) 3)  ! 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  The patient denies fever, chest pain, peripheral edema, abdominal pain, and melena.    Physical Exam  General:  overweight-appearing.   Head:  normocephalic.   Eyes:  No corneal or conjunctival inflammation noted. EOMI. Perrla. Ears:  External ear exam shows no significant lesions or deformities.  Otoscopic examination reveals clear canals, tympanic membranes are intact bilaterally without  bulging, retraction, inflammation or discharge. Hearing is grossly normal bilaterally. Nose:  External nasal examination shows no deformity or inflammation. Nasal mucosa are pink and moist without lesions or exudates. Mouth:  Oral mucosa and oropharynx without lesions or exudates.  Teeth in good repair. Neck:  No deformities, masses, or tenderness noted. Lungs:  Normal respiratory effort, chest expands symmetrically. Lungs are clear to auscultation, no crackles or wheezes. Heart:  Normal rate and regular rhythm. S1 and S2 normal without gallop, murmur, click, rub or other extra sounds. Abdomen:  Bowel sounds positive,abdomen soft and non-tender without masses, organomegaly or hernias noted. Msk:  No deformity or scoliosis noted of thoracic or lumbar spine.   Neurologic:  No cranial nerve deficits noted. Station and gait are normal. Plantar reflexes are down-going bilaterally. DTRs are symmetrical throughout. Sensory, motor and coordinative functions appear intact. Skin:  cutaneous lipomas 3 on L and 2-3 on R chest, the largest 1.5x1.0 cm, NT Cervical Nodes:  No lymphadenopathy noted Inguinal Nodes:  No significant adenopathy Psych:  Oriented X3, good eye contact, not agitated, and not suicidal.  Sad   Impression & Recommendations:  Problem # 1:  LIPOMAS, MULTIPLE (ICD-214.9) on chest Assessment New Reassured  Problem # 2:  DYSPNEA (ICD-786.05) Assessment: New  In the view of her fam. h/o CAD will obtain a card consult. Get labs.  Orders: Cardiology Referral (Cardiology)  Problem # 3:  OBESITY (ICD-278.00) Assessment: Comment Only  Problem #  4:  MIGRAINE HEADACHE (ICD-346.90) Assessment: Unchanged  Problem # 5:  GRIEF REACTION (ICD-309.0) Assessment: Unchanged Discussed. Coping OK  Problem # 6:  ANXIETY (ICD-300.00) due to #5 Assessment: Comment Only  Her updated medication list for this problem includes:    Alprazolam 0.5 Mg Tabs (Alprazolam) .[name]... 1 three times a day as  needed    Wellbutrin 100 Mg Tabs (Bupropion hcl) ..... Once daily    Effexor Xr 150 Mg Xr24h-cap (Venlafaxine hcl) .[name]..[name] Two times a day  Complete Medication List: 1)  Alprazolam 0.5 Mg Tabs (Alprazolam) .[name].. 1 three times a day as needed 2)  Wellbutrin 100 Mg Tabs (Bupropion hcl) .... Once daily 3)  Effexor Xr 150 Mg Xr24h-cap (Venlafaxine hcl) .... Two times a day 4)  Topamax 200 Mg Tabs (Topiramate)  Patient Instructions: 1)  Please schedule a follow-up appointment in 4 months. 2)  Will order CBC, TSH, BMET, Hepatic panel, UA, Lipids, 3)  Dx: V70.0 for this or next wk ;

## 2010-08-09 ENCOUNTER — Ambulatory Visit (INDEPENDENT_AMBULATORY_CARE_PROVIDER_SITE_OTHER): Payer: BC Managed Care – PPO | Admitting: Internal Medicine

## 2010-08-09 ENCOUNTER — Encounter: Payer: Self-pay | Admitting: Internal Medicine

## 2010-08-09 DIAGNOSIS — F3289 Other specified depressive episodes: Secondary | ICD-10-CM

## 2010-08-09 DIAGNOSIS — J45909 Unspecified asthma, uncomplicated: Secondary | ICD-10-CM

## 2010-08-09 DIAGNOSIS — F411 Generalized anxiety disorder: Secondary | ICD-10-CM

## 2010-08-09 DIAGNOSIS — J019 Acute sinusitis, unspecified: Secondary | ICD-10-CM

## 2010-08-09 DIAGNOSIS — J309 Allergic rhinitis, unspecified: Secondary | ICD-10-CM

## 2010-08-09 DIAGNOSIS — F329 Major depressive disorder, single episode, unspecified: Secondary | ICD-10-CM

## 2010-08-15 ENCOUNTER — Telehealth: Payer: Self-pay | Admitting: Internal Medicine

## 2010-08-16 NOTE — Assessment & Plan Note (Signed)
Summary: ?sinus inf/cd   Vital Signs:  Patient profile:   45 year old female Height:      60.5 inches Weight:      179 pounds BMI:     34.51 Temp:     99.5 degrees F oral Pulse rate:   84 / minute Pulse rhythm:   regular Resp:     16 per minute BP sitting:   108 / 70  (left arm) Cuff size:   regular  Vitals Entered By: Lanier Prude, Beverly Gust) (August 09, 2010 3:09 PM) CC: sinus, ear, teeth pain X 3 days Is Patient Diabetic? No   CC:  sinus, ear, and teeth pain X 3 days.  History of Present Illness: C/o sinus pain and B upper teeth pain. C/o yellow drainage x 1 wk C/o allerrgies The patient presents for a follow up of back pain, anxiety, depression and headaches.   Current Medications (verified): 1)  Alprazolam 0.5 Mg Tabs (Alprazolam) .[name].. 1 Three Times A Day As Needed 2)  Wellbutrin 100 Mg Tabs (Bupropion Hcl) .... Once Daily 3)  Effexor Xr 150 Mg Xr24h-Cap (Venlafaxine Hcl) .... Two Times A Day 4)  Topamax 200 Mg Tabs (Topiramate) .... Take 1 Tab By Mouth At Bedtime 5)  Hydrocodone-Homatropine 5-1.5 Mg/65ml Syrp (Hydrocodone-Homatropine) .[name].. 1 Tsp By Mouth Q 6 Hrs As Needed 6)  Proair Hfa 108 (90 Base) Mcg/act Aers (Albuterol Sulfate) .... 2 Puffs Four Times Per Day As Needed  Allergies (verified): 1)  ! Penicillin V Potassium (Penicillin V Potassium) 2)  ! Sulfadiazine (Sulfadiazine) 3)  ! Codeine Sulfate (Codeine Sulfate) 4)  ! 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  The patient denies fever, chest pain, prolonged cough, abdominal pain, muscle weakness, and difficulty walking.    Physical Exam  General:  alert and  overweight-appearing., mild ill    Head:  B max sinuses are tender Mouth:  Erythematous throat and intranasal mucosa c/w URI  Neck:  no masses.  supple.   Lungs:  normal respiratory effort and normal breath sounds.   Heart:  normal rate and regular rhythm.   Abdomen:  Bowel sounds positive,abdomen soft and non-tender without masses, organomegaly or hernias noted. Msk:  No deformity or scoliosis noted of thoracic or lumbar spine.   Extremities:  no edema, no erythema  Neurologic:  No cranial nerve deficits noted. Station and gait are normal. Plantar reflexes are down-going bilaterally. DTRs are symmetrical throughout. Sensory, motor and coordinative functions appear intact. Skin:  cutaneous lipomas 3 on L and 2-3 on R chest, the largest 1.5x1.0 cm, NT Psych:  Oriented X3, good eye contact, not agitated, and not suicidal.  Sad   Impression & Recommendations:  Problem # 1:  SINUSITIS- ACUTE-NOS (ICD-461.9) Assessment New  The following medications were removed from the medication list:    Hydrocodone-homatropine 5-1.5 Mg/15ml Syrp (Hydrocodone-homatropine) .[name]... 1 tsp by mouth q 6 hrs as needed    Levofloxacin 500 Mg Tabs (Levofloxacin) .[name]... 1po once daily Her updated medication list for this problem includes:    Flonase 50 Mcg/act Susp (Fluticasone propionate) .[name]... 1 spr each nostr qd as needed    Sudafed 12 Hour 120 Mg Xr12h-tab (Pseudoephedrine hcl) .[name]... 1 by  mouth two times a day as needed allergies    Levaquin 500 Mg Tabs (Levofloxacin) .[name]... 1 by mouth qd  Problem # 2:  ALLERGIC RHINITIS (ICD-477.9) Assessment: Deteriorated  Her updated medication list for this problem includes:    Zyrtec Allergy 10 Mg Tabs (Cetirizine hcl) .[name]... 1 by mouth qd    Flonase 50 Mcg/act Susp (Fluticasone propionate) .[name]... 1 spr each nostr qd as needed    Sudafed 12 Hour 120 Mg Xr12h-tab (Pseudoephedrine hcl) .[name]... 1 by mouth two times a day as needed allergies  Problem # 3:  ASTHMA  (ICD-493.90) Assessment: Deteriorated  The following medications were removed from the medication list:    Prednisone 10 Mg Tabs (Prednisone) .[name]... 3po qd for 3days, then 2po qd for 3days, then 1po qd for 3days, then stop Her updated medication list for this problem includes:    Proair Hfa 108 (90 Base) Mcg/act Aers (Albuterol sulfate) .[name]... 2 puffs four times per day as needed  Problem # 4:  DEPRESSION (ICD-311) Assessment: Improved  Her updated medication list for this problem includes:    Alprazolam 0.5 Mg Tabs (Alprazolam) .[name]... 1 three times a day as needed    Wellbutrin 100 Mg Tabs (Bupropion hcl) ..... Once daily    Effexor Xr 150 Mg Xr24h-cap (Venlafaxine hcl) .[name]..[name] Two times a day  Problem # 5:  ANXIETY (ICD-300.00) Assessment: Unchanged  Her updated medication list for this problem includes:    Alprazolam 0.5 Mg Tabs (Alprazolam) .[name]... 1 three times a day as needed    Wellbutrin 100 Mg Tabs (Bupropion hcl) ..... Once daily    Effexor Xr 150 Mg Xr24h-cap (Venlafaxine hcl) .[name]..[name] Two times a day  Problem # 6:  MIGRAINE HEADACHE (ICD-346.90) Assessment: Unchanged  Her updated medication list for this problem includes:    Relpax 40 Mg Tabs (Eletriptan hydrobromide) .[name]... 1 by mouth once daily as needed migraine  Complete Medication List: 1)  Alprazolam 0.5 Mg Tabs (Alprazolam) .[name].. 1 three times a day as needed 2)  Wellbutrin 100 Mg Tabs (Bupropion hcl) .... Once daily 3)  Effexor Xr 150 Mg Xr24h-cap (Venlafaxine hcl) .... Two times a day 4)  Topamax 200 Mg Tabs (Topiramate) .... Take 1 tab by mouth at bedtime 5)  Proair Hfa 108 (90 Base) Mcg/act Aers (Albuterol sulfate) .... 2 puffs four times per day as needed 6)  Zyrtec Allergy 10 Mg Tabs (Cetirizine hcl) .[name].. 1 by mouth qd 7)  Vitamin D 1000 Unit Tabs (Cholecalciferol) .[name].. 1 by mouth qd 8)  Flonase 50 Mcg/act Susp (Fluticasone propionate) .[name].. 1 spr each nostr qd as needed 9)  Relpax 40 Mg Tabs (Eletriptan  hydrobromide) .[name].. 1 by mouth once daily as needed migraine 10)  Sudafed 12 Hour 120 Mg Xr12h-tab (Pseudoephedrine hcl) .[name].. 1 by mouth two times a day as needed allergies 11)  Levaquin 500 Mg Tabs (Levofloxacin) .[name].. 1 by mouth qd  Patient Instructions: 1)  Use the Sinus rinse as needed 2)  Please schedule a follow-up appointment in 6 months well w/labs. 3)  Call if you are not better in a reasonable amount of time or if worse.  Prescriptions: LEVAQUIN 500 MG TABS (LEVOFLOXACIN) 1 by mouth qd  #10 x 0   Entered and Authorized by:   Tresa Garter MD   Signed by:   Tresa Garter MD on 08/09/2010   Method used:   Print then Give to Patient   RxID:   [id number] PROAIR HFA 108 (90  BASE) MCG/ACT AERS (ALBUTEROL SULFATE) 2 puffs four times per day as needed  #1 x 3   Entered and Authorized by:   Tresa Garter MD   Signed by:   Tresa Garter MD on 08/09/2010   Method used:   Print then Give to Patient   RxID:   [id number] TOPAMAX 200 MG TABS (TOPIRAMATE) Take 1 tab by mouth at bedtime  #30 x 5   Entered and Authorized by:   Tresa Garter MD   Signed by:   Tresa Garter MD on 08/09/2010   Method used:   Print then Give to Patient   RxID:   [id number] EFFEXOR XR 150 MG XR24H-CAP (VENLAFAXINE HCL) two times a day  #60 x 5   Entered and Authorized by:   Tresa Garter MD   Signed by:   Tresa Garter MD on 08/09/2010   Method used:   Print then Give to Patient   RxID:   [id number] WELLBUTRIN 100 MG TABS (BUPROPION HCL) once daily  #30 x 5   Entered and Authorized by:   Tresa Garter MD   Signed by:   Tresa Garter MD on 08/09/2010   Method used:   Print then Give to Patient   RxID:   [id number] SUDAFED 12 HOUR 120 MG XR12H-TAB (PSEUDOEPHEDRINE HCL) 1 by mouth two times a day as needed allergies  #60 x 1   Entered and Authorized by:   Tresa Garter MD   Signed by:   Tresa Garter MD on  08/09/2010   Method used:   Print then Give to Patient   RxID:   [id number] RELPAX 40 MG TABS (ELETRIPTAN HYDROBROMIDE) 1 by mouth once daily as needed migraine  #12 x 11   Entered and Authorized by:   Tresa Garter MD   Signed by:   Tresa Garter MD on 08/09/2010   Method used:   Print then Give to Patient   RxID:   [id number] FLONASE 50 MCG/ACT SUSP (FLUTICASONE PROPIONATE) 1 spr each nostr qd as needed  #1 x 3   Entered and Authorized by:   Tresa Garter MD   Signed by:   Tresa Garter MD on 08/09/2010   Method used:   Print then Give to Patient   RxID:   [id number]    Orders Added: 1)  Est. Patient Level IV [70623]

## 2010-08-25 NOTE — Progress Notes (Signed)
Summary: NEW RX?   Phone Note Call from Patient Call back at Work Phone [phone] [facility] back at [facility] till 6:30   Summary of Call: Pt continues to have ear pain, especially at night. ALso has low grade fever.  She has only 2 days left of antibiotic. Please adivse.  Initial call taken by: Lamar Sprinkles, CMA,  August 15, 2010 2:13 PM  Follow-up for Phone Call        ok to ref Levaquin Follow-up by: Tresa Garter MD,  August 15, 2010 6:46 PM  Additional Follow-up for Phone Call Additional follow up Details #1::        informed pt Additional Follow-up by: Ami Bullins CMA,  August 15, 2010 6:51 PM    Prescriptions: LEVAQUIN 500 MG TABS (LEVOFLOXACIN) 1 by mouth qd  #10 x 0   Entered by:   Ami Bullins CMA   Authorized by:   Tresa Garter MD   Signed by:   Bill Salinas CMA on 08/15/2010   Method used:   Electronically to        [facility] [address] [phone]* (retail)       [address]       [place], [place]  [address]       Ph: [phone] or [phone]       Fax: [phone]   RxID:   [id number]

## 2010-10-14 NOTE — Discharge Summary (Signed)
[facility]  Patient:    Amy Lang, Amy Lang                    MRN: [id number] Adm. Date:  [id number] Disc. Date: [id number] Attending:  Minette Headland                           Discharge Summary  NO DICTATION DD:  03/02/00 TD:  03/02/00 Job: [phone] [id number]

## 2010-10-14 NOTE — Discharge Summary (Signed)
[facility]  Patient:    Amy Lang, Amy Lang Visit Number: [id number] MRN: [id number]          Service Type: DSU Location: [id number] Attending Physician:  Minette Headland Dictated by:   Freddy Finner, M.D. Admit Date:  06/25/2001 Discharge Date: 06/26/2001                             Discharge Summary  DISCHARGE DIAGNOSES:          1. Pelvic endometriosis.                               2. Uterine adenomyosis.                               3. Acrochordon of left thigh (skin tag).  OPERATIVE PROCEDURES:         1. Laparoscopic-assisted vaginal hysterectomy.                               2. Fulguration of pelvic endometriosis.                               3. Excision of skin tag from left thigh.  POSTOPERATIVE COMPLICATIONS:  None.  DISPOSITION:                  The patient is in satisfactory improved condition at the time of her discharge.  DISCHARGE INSTRUCTIONS:       She was told to progressively increase physical activity.  She is to avoid vaginal entry.  She is to avoid heavy lifting.  She is to call for fever or heavy bleeding.  She is to return to the office in approximately two weeks for postoperative followup.  DISCHARGE MEDICATIONS:        Motrin and Percocet as needed for postoperative pain.  HISTORY:                      For details of History of Present Illness, Past Medical History, Family History, Review of Systems, and Physical Examination are recorded in the admission note.  Briefly, physical findings were remarkable for previous laparoscopy findings with pelvic endometriosis and for suspicion of uterine adenomyosis by clinical presentation.  LABORATORY DATA:              During this admission includes histologic exam of the surgically removed tissue, all of which was benign but which did confirm uterine adenomyosis and acrochordon of skin of left thigh.  Other laboratory data included a CBC on admission with  hemoglobin 13.8. Postoperative hemoglobin was 11.7.  Normal urinalysis on admission.  Normal prothrombin time of 52.  HOSPITAL COURSE:              The patient was admitted on the morning of surgery.  She was treated perioperatively with IV Cefotan and with PAD hose. The above-described surgical procedure was accomplished.  There were no intraoperative or postoperative complications.  Approximately 36 hours after the surgery, she was ready for discharge and was discharged home with Disposition as noted above. Dictated by:   Freddy Finner, M.D. Attending Physician:  Minette Headland DD:  07/05/01 TD:  07/05/01 Job: [id number]

## 2010-10-14 NOTE — Discharge Summary (Signed)
[facility]  Patient:    Amy Lang, Amy Lang                    MRN: [id number] Adm. Date:  [id number] Disc. Date: [id number] Attending:  Minette Headland                           Discharge Summary  DISCHARGE DIAGNOSES:          Probable early spontaneous abortion.  OPERATIVE PROCEDURE:          Laparoscopy, D&C.  POSTOPERATIVE COMPLICATIONS:  None.  DISPOSITION:                  The patient was in satisfactory improved condition at the time of her discharge. She is to have progressively increasing physical activity. She is to return to the office in 7-10 days for postoperative follow-up. She is to take a regular diet. She is given Mepergan Fortis to be taken 1 or 2 every 4-6 hours as needed for postoperative pain.  Details of the present illness and physical exam are recorded in the admission note and are in the operative summary.  HOSPITAL COURSE:              The patient was admitted for a surgical procedure which was accomplished as noted above. The surgically removed tissue included endometrium which was benign secretory phase and ______ from the pelvic cavity, abdominal cavity which was fibrin and benign columnar cells with no evidence of villi. No villi were seen with either tissue specimen.  Other laboratory data during this admission includes admission hemoglobin of 13, blood type is A+ with a negative antibody screen. Postoperative hemoglobin was 12.  Following the surgical procedure noted above, the patient was admitted as an observation status patient. She remained afebrile and her vital signs were stable through the night. She was adequately medicated for pain and had adequate control of nausea. The morning of the first postoperative day, she was considered to be in satisfactory condition for discharge and was discharged with disposition as noted above. DD:  04/17/00 TD:  04/18/00 Job: [id number]

## 2010-10-14 NOTE — Op Note (Signed)
[facility]  Patient:    Amy Lang, Amy Lang Visit Number: [id number] MRN: [id number]          Service Type: DSU Location: [phone] Attending Physician:  Minette Headland Dictated by:   Freddy Finner, M.D. Proc. Date: 06/25/01 Admit Date:  06/25/2001                             Operative Report  PREOPERATIVE DIAGNOSIS:       Probable uterine adenomyosis, previous history of pelvic endometriosis.  POSTOPERATIVE DIAGNOSIS:      Probable uterine adenomyosis, previous history of pelvic endometriosis, recurrence of endometriosis along left uterosacral ligament.  OPERATION:                    Laparoscopically assisted vaginal hysterectomy, fulguration of pelvic endometriosis.  SECONDARY DIAGNOSES:          Skin tag of left thigh with excisional biopsy of left skin tag.  SURGEON:                      Freddy Finner, M.D.  ASSISTANT:                    Trevor Iha, M.D.  ANESTHESIA:                   General endotracheal.  ESTIMATED BLOOD LOSS:         100 cc.  COMPLICATIONS:                None.  HISTORY:                      Patient is a 45 year old with a long history of pelvic problems including dysmenorrhea and menorrhagia.  She has requested definitive surgical intervention.  She is admitted now for laparoscopically assisted vaginal hysterectomy.  Further details of the present illness recorded in admission note.  Patient was admitted on the morning of surgery.  She was given Cipro 500 mg IV as antibiotic prophylaxis.  This was chosen due to an allergy to penicillin and to cephalosporins.  She was placed on PAS hose.  She was brought to the operating room, placed under adequate general endotracheal anesthesia, placed in the dorsal lithotomy position using the Craig stirrups system.  Betadine prep of abdomen, perineum, upper thighs, and vagina was carried out with Betadine scrub followed by Betadine solution.  Bladder was  evacuated with a Robinson catheter.  Hulka tenaculum was attached to the cervix under direct visualization.  Sterile drapes were applied.  Two incisions were initially made in the abdomen, one at the umbilicus and one just above the symphysis. An 11 mm disposable trocar was introduced at the umbilicus and direct inspection revealed adequate placement with no evidence of injury on entry. Pneumoperitoneum was allowed to accumulate with carbon dioxide gas.  A 5 mm trocar was placed through the lower incision and through it ______ placed for manipulation of pelvic structures during procedure.  Careful systematic examination of the pelvic and abdominal contents was carried out.  The appendix was normal.  There were no apparent abnormalities in the upper abdomen including the liver.  The uterus itself was slightly enlarged.  The tubes and ovaries were normal.  There were flame like hemorrhages along the left uterosacral consistent with recurrent pelvic endometriosis.  The bipolar coagulation forceps were used  through the operating channel of the laparoscope and the endometriotic lesions fulgurated.  A third incision was then made in the right lower quadrant.  Through it a 5 mm trocar was placed under direct visualization and transillumination of the abdominal wall.  A 5 mm laparoscope was placed through this port and the camera attached.  The LigaSure system was then passed through the 11 mm trocar and with progressive sealing and division of pedicles the broad ligaments on each side were developed.  This was carried down to the level approximately at the uterine arteries.  The bladder peritoneum was also sealed and divided with the system.  Complete hemostasis was noted at this point.  Attention was then turned vaginally.  Posterior weighted vaginal retractor is placed.  Hulka tenaculum was removed.  Cervix was grasped with a Christella Hartigan tenaculum.  Colpotomy incision was made while tenting the  mucosa posterior to the cervix.  Cervix was circumscribed with the scalpel.  Using the Heaney style LigaSure clamp the uterosacral pedicles were developed, bladder pillars were developed and divided.  Bladder was advanced off the cervix.  Progressive bites were continued to control the cardinal ligaments and probably a segment of the cervical branch of the uterine artery. The uterus was then delivered through the introitus and a small remaining pedicle on the right was coagulated with the LigaSure system and divided.  The angles of the vagina were anchored to the uterosacral pedicles.  Uterosacrals were plicated in the midline with an interrupted 0 Monocryl suture.  Cuff was closed vertically with figure-of-eight of 0 Monocryl.  Foley catheter was placed.  Reinspection laparoscopically reveals a small amount of bleeding along the posterior cuff near the uterosacrals.  These were easily controlled with the bipolar coagulation forceps for complete hemostasis.  This was confirmed under reduced intra-abdominal pressure.  At this point the procedure was terminated.  Gas was allowed to escape from the abdomen.  All instruments removed.  Skin incisions were closed with interrupted subcuticular sutures of 3-0 Dexon.  Plain Marcaine 0.25% was injected into the incision sites for postoperative analgesia.  Hemostasis was complete.  No Steri-Strips were applied due to a history of tape allergy.  The patient was awakened and taken to recovery in good condition after a small skin tag on the medial left thigh was sharply excised.  It was submitted for histologic examination.  No suturing was required. Dictated by:   Freddy Finner, M.D. Attending Physician:  Minette Headland DD:  06/25/01 TD:  06/25/01 Job: [id number]

## 2010-10-14 NOTE — Op Note (Signed)
[facility]  Patient:    Amy Lang, Amy Lang                    MRN: [id number] Proc. Date: 04/17/00 Adm. Date:  [id number] Disc. Date: [id number] Attending:  Minette Headland                           Operative Report  This is a redictation.  PREOPERATIVE DIAGNOSES:       Suspected ectopic pregnancy.  POSTOPERATIVE DIAGNOSES:      Possible ectopic pregnancy with spontaneous abortion of ectopic pregnancy out the fallopian tube.  OPERATION PERFORMED:          D&C, laparoscopy, retrieval of fibrous material possibly consistent with an early gestational sac from the abdominal cavity. No other significant abnormality noted including no abnormality of tubes, ovaries or uterus.  DESCRIPTION OF PROCEDURE:  The patient is a 45 year old who was found to have a slowly rising quantitative HCG and was having some left sided pain. She had an empty uterus by ultrasound in the office and it was suspected that she had an ectopic pregnancy particularly given her previous history of endometriosis. She was admitted at this time for [facility] laparoscopy. She was admitted on the morning of surgery. She was given 1 gm bolus of Cefotan and she was taken to the operating room. The abdomen, perineum and vagina were prepped in the usual fashion. The bladder was evacuated with a Robinson catheter. Search was visualized using a bivalve speculum. The cervix was grasped with a single tooth tenaculum and progressively dilated to 23 with Pratts. Gentle curettage was performed and exploration with Randall stone forceps with no obvious tissue consistent with parts of conception. A Hulka tenaculum was then attached to the cervix. Sterile drapes were applied to the abdomen. Two small incisions were made, one at the umbilicus and one just below the symphysis. An 11 mm trocar was introduced to the umbilicus. Inspection in the peritoneal cavity revealed adequate placement with no evidence  of injury on entry. There was no evidence of bloody staining of the omentum or peritoneal cavity. Pneumoperitoneum was allowed to accumulate with carbon dioxide gas. A second trocar was placed through the lower incision. Systematic ______ of pelvic and abdominal contents was carried out and there was no obvious pelvic pathology. There was a small amount of serosanguineous fluid in the cul-de-sac measuring less than 5 mm. There was filmy fibrinous or adhesion-like free floating body in the fluid which was retrieved and some ______ examination. At this point, the procedure was terminated, the gas was allowed to escape. The incisions were closed after removal of the instruments with interrupted subcuticular sutures of 3-0 Dexon. Dermabond was applied.  The patient tolerated the operative procedure well and she was admitted overnight as an observation patient for analgesia and further observation. In the absence of complications, she will be discharged in the morning with routine outpatient surgical instructions. DD:  04/17/00 TD:  04/18/00 Job: [id number]

## 2010-10-14 NOTE — Op Note (Signed)
NAMEKENDA, Amy Lang                ACCOUNT NO.:  [id number]   MEDICAL RECORD NO.:  [id number]          PATIENT TYPE:  AMB   LOCATION:  SDC                           FACILITY:  [facility]   PHYSICIAN:  Freddy Finner, M.D.   DATE OF BIRTH:  January 03, 1966   DATE OF PROCEDURE:  10/09/2006  DATE OF DISCHARGE:                               OPERATIVE REPORT   PREOPERATIVE DIAGNOSES:  1. Chronic pelvic pain, particularly left pelvic pain.  2. Known pelvic endometriosis.  3. Extensive workup to date including a GI workup which has not shown      an obvious other etiology for symptoms.   POSTOPERATIVE DIAGNOSES:  1. Extensive pelvic adhesions.  2. Probable recurrent minimal endometriosis.   OPERATIVE PROCEDURES:  1. Laparoscopy.  2. Extensive adhesiolysis.   ANESTHESIA:  General endotracheal.   ESTIMATED INTRAOPERATIVE BLOOD LOSS:  10 cc.   INTRAOPERATIVE COMPLICATIONS:  None.   HISTORY OF PRESENT ILLNESS:  The patient is a 45 year old who has  previously had hysterectomy for adenomyosis and has had previous  surgical procedures, particularly laparoscopy for pelvic endometriosis.  In the recent past, she has had progression of pelvic pain with rather  persistent left pelvic and left lower abdominal pain.  She has been  evaluated carefully with ultrasound and with GI evaluation including  colonoscopy with no abnormal findings.  She is now admitted for  laparoscopy.  She was admitted on the morning of surgery.  She was  brought to the operating room, placed under adequate general anesthesia,  placed in the dorsal lithotomy position using the [place] stirrup system.  Betadine prep of the abdomen was carried out in the usual fashion.  Bladder was evacuated with sterile catheter and sterile technique.  Sponge forceps were placed in the vagina with sponge at the tip for use  for identifying the vagina during the operative procedure.  Two small  incisions were made through old scars after  application of sterile  drapes.  One was at the umbilicus, one just above the symphysis at the  midline.  An 11 mm blade and disposable trocar was introduced in the  umbilicus.  Direct inspection revealed adequate placement with no  evidence of injury on entry.  Pneumoperitoneum was allowed to accumulate  with CO2 gas.  A second 5 mm trocar was placed through the lower  incision under direct visualization.  Systematic examination of pelvic  and abdominal contents was carried out.  The liver and gallbladder were  visible and appeared to be normal.  The appendix was visible and normal.  There were no adhesions or other apparent abnormalities in the upper  abdominal.  The right tube and ovary was completely free of disease and  free of adhesions. There were minimal adhesions of the left ovary to the  left pelvic sidewall which actually were lysed by simply elevating the  ovary with a blunt probe which was used through the 5 mm trocar slope.  There were extensive adhesions of bowel and filmy broad adhesions in the  cul-de-sac and along the vaginal cuff.  Using the Unipolar device,  with  reticulated endoshears, these adhesions were carefully freed and  excised.  Areas where the adhesions were attached raised the question of  endometriosis, and these were fulgurated with the UNICORE cautery.  Given the good condition of the ovaries, it was elected not to remove  either.  The rational is that if one was removed, both should be if we  are trying to adequately treat endometriosis.  Preoperatively, it was  suspected that she might have left ovarian entrapment, and this, in  fact, was not so.  Please note, the photographs were made of the  abdominal and pelvic findings, and they are retained in the office  record.  After completing the dissection and freeing all of the  adhesions, the procedure was ended.  Gas was allowed to escape from the  abdomen.  The skin incisions were anesthetized with 0.25%  plain  Marcaine.  The skin incisions were closed with interrupted subcuticular  sutures of 3-0 Dexon.  Steri-Strips were applied to the lower incision  and a small compression bandage in the umbilicus.  The patient was awake  and taken to the recovery room in good condition.  She will be  discharged in the immediate postop period for follow up in the office in  approximately two weeks.  She is to call for fever, severe pain or heavy  bleeding.  She has Mepergan Fortis to be taken as needed for  postoperative pain.  She will be given routine outpatient surgical  instructions.      Freddy Finner, M.D.  Electronically Signed     WRN/MEDQ  D:  10/09/2006  T:  10/09/2006  Job:  [id number]

## 2010-10-14 NOTE — H&P (Signed)
[facility]  Patient:    Amy Lang, DANDY Visit Number: [id number] MRN: [id number]          Service Type: Attending:  Freddy Finner, M.D. Dictated by:   Freddy Finner, M.D. Adm. Date:  06/25/01                           History and Physical  ADMITTING DIAGNOSES:          1. Uterine enlargement, probable adenomyosis.                               2. Clinical symptoms of chronic severe                                  dysmenorrhea, menorrhagia, unresponsive                                  to more conservative surgical procedures.                               3. Intolerance of oral contraceptives due                                  to chronic migraine.  HISTORY OF PRESENT ILLNESS:   The patient is a 45 year old gravida 1, para 0, who had a laparoscopy in 2001, for what was thought to be a probable spontaneous abortion of an ectopic pregnancy.  She has continued to have severe monthly menstrual pain and menorrhagia.  On bimanual examination the uterus is very tender and slightly enlarged.  The patient is unable to tolerate oral contraceptives, and for that reason does not take them, because of migraine headaches.  She is absolutely certain that she does not wish to conceive.  She has requested definitive surgical intervention.  She is now admitted for a laparoscopically-assisted vaginal hysterectomy and possible bilateral salpingo-oophorectomy, only if the ovaries are abnormal.  She has reviewed a video in the office, describing the operative procedure, including the potential complications and risks of the procedure.  REVIEW OF SYSTEMS:            Otherwise negative.  There are no cardiopulmonary, GI, or GU complaints.  She does complain in addition to pain of severe rectal pressure, particularly with menses.  PAST MEDICAL HISTORY:         1. The patient does have chronic migraine                                  headaches.           2. She has had a history of cervical dysplasia                                  many years ago, but had cryo following  that diagnosis, and has had no abnormal Pap                                  smears since that time.  She has no other                                  known significant medical illnesses. PAST SURGICAL HISTORY:        1. A laparoscopy in 2001, as noted above.                               2. She had knee surgery in 1999.  ALLERGIES:                    PENICILLIN, CODEINE, DARVOCET, STADOL, KEFLEX, AND CECLOR.  CURRENT MEDICATIONS:          1. Effexor 150 mg q.d.                               2. Zyrtec 10 mg q.d.                               3. Topamax 100 mg q.d.  SOCIAL HISTORY:               She has never had a blood transfusion.   She is not a cigarette smoker.  She does not drink alcohol.  FAMILY HISTORY:               Noncontributory.  Is significant for breast cancer and cervical cancer in grandmothers, and for heart disease in both the maternal and paternal grandmothers.  PHYSICAL EXAMINATION  HEENT:                        Grossly within normal limits.  NECK:                         Thyroid gland is not palpably enlarged.  BREASTS:                      Normal.  VITAL SIGNS:                  Blood pressure in the office 104/76.  CHEST:                        Clear to auscultation.  HEART:                        Normal sinus rhythm without murmurs, rubs, or gallops.  ABDOMEN:                      Soft, nontender, without appreciable organomegaly or palpable masses.  PELVIC:                       Clinically normal except for tenderness of the uterus.  The uterus is not palpably enlarged.  There are no palpable adnexal masses.  The cervix, vagina, and external genitalia are considered to be normal.  BIMANUAL:                     The rectum was palpably normal on rectal vaginal exam, and confirms the above  findings.  ASSESSMENT:                   Uterine adenomyosis with clinical symptoms of                               chronic pelvic pain, rectal pressure,                               dysmenorrhea, and menorrhagia.  PLAN:                         A laparoscopically-assisted vaginal hysterectomy. Dictated by:   Freddy Finner, M.D. Attending:  Freddy Finner, M.D. DD:  06/24/01 TD:  06/24/01 Job: [id number]

## 2010-10-28 ENCOUNTER — Other Ambulatory Visit: Payer: Self-pay | Admitting: Dermatology

## 2010-11-02 ENCOUNTER — Telehealth: Payer: Self-pay | Admitting: *Deleted

## 2010-11-02 MED ORDER — PROMETHAZINE HCL 25 MG PO TABS: ORAL_TABLET | ORAL | Status: DC

## 2010-11-02 MED ORDER — PROMETHAZINE HCL 50 MG RE SUPP: 50.0000 mg | Freq: Four times a day (QID) | RECTAL | Status: DC | PRN

## 2010-11-02 NOTE — Telephone Encounter (Signed)
ok 

## 2010-11-02 NOTE — Telephone Encounter (Signed)
Pt called and wants a prescription for Phenergan 50 mg suppository and pill. She states you have never filled this medication. [facility]. Please Advise on refills

## 2010-11-03 NOTE — Telephone Encounter (Signed)
Left mess for patient to call back with pharmacy info

## 2010-11-04 NOTE — Telephone Encounter (Signed)
done

## 2010-11-16 ENCOUNTER — Ambulatory Visit: Payer: BC Managed Care – PPO | Attending: Gynecologic Oncology | Admitting: Gynecologic Oncology

## 2010-11-16 DIAGNOSIS — Z87411 Personal history of vaginal dysplasia: Secondary | ICD-10-CM | POA: Insufficient documentation

## 2010-11-16 DIAGNOSIS — Z79899 Other long term (current) drug therapy: Secondary | ICD-10-CM | POA: Insufficient documentation

## 2010-11-16 DIAGNOSIS — N949 Unspecified condition associated with female genital organs and menstrual cycle: Secondary | ICD-10-CM | POA: Insufficient documentation

## 2010-11-16 DIAGNOSIS — Z90710 Acquired absence of both cervix and uterus: Secondary | ICD-10-CM | POA: Insufficient documentation

## 2010-11-16 DIAGNOSIS — Z801 Family history of malignant neoplasm of trachea, bronchus and lung: Secondary | ICD-10-CM | POA: Insufficient documentation

## 2010-11-17 ENCOUNTER — Other Ambulatory Visit: Payer: Self-pay | Admitting: Internal Medicine

## 2010-11-17 NOTE — Consult Note (Signed)
NAMESALLEY, Amy Lang                ACCOUNT NO.:  [id number]  MEDICAL RECORD NO.:  [id number]  LOCATION:  GYN                          FACILITY:  [facility]  PHYSICIAN:  Ellinore Merced A. Duard Brady, MD    DATE OF BIRTH:  [birth year]  DATE OF CONSULTATION:  11/16/2010 DATE OF DISCHARGE:                                CONSULTATION   REFERRING PHYSICIAN:  Freddy Finner, MD  HISTORY OF PRESENT ILLNESS:  The patient is seen today in consultation requested by Dr. Jennette Kettle.  Amy Lang is a very pleasant 45 year old gravida 1, para 0 who has had long history of abnormal Pap smears dating back into her 48s.  At that time, she had several episodes with mild dysplasia and she has undergone cryotherapy both in her 70s as well as her early 30s.  She underwent a hysterectomy at the age of 41 secondary to endometriosis.  Pathology at that time revealed slight cervicitis with no dysplasia identified in the cervix.  She had a secretory endometrium, adenomyosis and benign uterine serosa.  She continues to have intermittently abnormal Pap smears and in 2010 had an abnormal Pap smear that prompted a colposcopy.  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  A separate central apex biopsy was negative.  She was treated with Efudex x3 and her Pap smears normalized with the most recent one being in September of 2011 which was negative.  Repeat Pap smear in April of 2012 came back showing low-grade dysplasia and she was referred to [name] for evaluation.  She is otherwise doing quite well.  She does complaint of vaginal dryness and some irritation.  She does have vasomotor symptoms.  She states she has had gonadotropins drawn several times and they showed that she is almost menopausal but not quite there. She was going to have been started on Estrace cream by Dr. Jennette Kettle for vaginal dryness type symptoms; however, when the Pap smear returned as abnormal that was not done and she was referred to [name].  She otherwise denies any  complaints.  She denies any vaginal bleeding, change in bowel or bladder habits.  MEDICATIONS: 1. Effexor 300 mg daily. 2. Topamax 200 mg daily. 3. Wellbutrin 100 mg daily. 4. Baby aspirin daily. 5. Multivitamin. 6. Vitamin C. 7. Vitamin D.  ALLERGIES:  Include SULFA, PENICILLIN, CODEINE, DARVOCET and STADOL.  PAST SURGICAL HISTORY:  Four laparoscopies for endometriosis, 2cryosurgeries, hysterectomy, left knee arthroscopy.  SOCIAL HISTORY:  She denies use of tobacco or alcohol.  She is married. She works as a Interior and spatial designer.  Her husband works as a Astronomer.  FAMILY HISTORY:  Her father died of lung cancer 5 years ago.  Her mother died 4 years ago of coronary artery disease, diabetes and complications of renal insufficiency.  Her brother died at age of 80 four years ago.  HEALTH MAINTENANCE:  She is up-to-date on her mammograms with her last one being in August.  PHYSICAL EXAMINATION:  VITAL SIGNS:  Weight 181 pounds, height 5 feet 4, BMI of 34.  Temperature 98.2, respirations 16, pulse 72, blood pressure 126/80. GENERAL:  Well-nourished, well-developed female, in no acute distress. NECK:  Supple.  There is no  lymphadenopathy, no thyromegaly. LUNGS:  Clear to auscultation bilaterally. CARDIOVASCULAR EXAM:  Regular rate and rhythm. ABDOMEN:  Obese.  She has a well-healed laparoscopic incision.  Abdomen is soft, nontender, nondistended.  No palpable masses or hepatosplenomegaly.  Groins are negative for adenopathy. EXTREMITIES:  There is no edema. PELVIC:  External genitalia is within normal limits.  The vagina is somewhat atrophic with loss of rugation.  The vaginal cuff is visualized.  She no gross visible lesions.  Colposcopic evaluation performed after the application of acetic acid.  There is no acetowhite epithelial changes or lesions noted.  Bimanual examination reveals no masses or nodularity.  ASSESSMENT:  The patient is a 45 year old status post  hysterectomy for endometriosis who has a long 20 plus year history of abnormal Pap smears who did have a biopsy showing VAIN2 in 2010.  She was successfully treated with Efudex, most recently had a low-grade Pap smear.  PLAN:  I believe that we could try and improve her vaginal health. After talking about Estrace versus Vagifem tablets, she would like to try the Vagifem tablets.  She was given a prescription for that.  At this point, I would recommend repeating a Pap smear in 6 months.  She is not sure if she would like to do that with Dr. Jennette Kettle or with [name].  It will be left to her to make the appointment.  She was very pleased that we did not recommend any additional treatment today.     Delaynie Stetzer A. Duard Brady, MD     PAG/MEDQ  D:  11/16/2010  T:  11/16/2010  Job:  [id number]  cc:   Telford Nab, R.N. [address] [place], [place] [address]  W. Varney Baas, M.D. Fax: [phone]  Georgina Quint. Plotnikov, MD [address] [place] [address]  Electronically Signed by Cleda Mccreedy MD on 11/17/2010 02:47:20 PM

## 2011-02-18 ENCOUNTER — Other Ambulatory Visit: Payer: Self-pay | Admitting: Internal Medicine

## 2011-04-03 ENCOUNTER — Observation Stay (HOSPITAL_COMMUNITY)
Admission: EM | Admit: 2011-04-03 | Discharge: 2011-04-05 | DRG: 494 | Disposition: A | Discharge status: Home / Self Care | Payer: BC Managed Care – PPO | Attending: Surgery | Admitting: Surgery

## 2011-04-03 ENCOUNTER — Encounter: Payer: Self-pay | Admitting: *Deleted

## 2011-04-03 ENCOUNTER — Emergency Department (HOSPITAL_COMMUNITY): Payer: BC Managed Care – PPO

## 2011-04-03 ENCOUNTER — Other Ambulatory Visit (INDEPENDENT_AMBULATORY_CARE_PROVIDER_SITE_OTHER): Payer: Self-pay | Admitting: Surgery

## 2011-04-03 DIAGNOSIS — K8066 Calculus of gallbladder and bile duct with acute and chronic cholecystitis without obstruction: Principal | ICD-10-CM | POA: Insufficient documentation

## 2011-04-03 DIAGNOSIS — J45909 Unspecified asthma, uncomplicated: Secondary | ICD-10-CM | POA: Insufficient documentation

## 2011-04-03 DIAGNOSIS — R1011 Right upper quadrant pain: Secondary | ICD-10-CM

## 2011-04-03 DIAGNOSIS — F329 Major depressive disorder, single episode, unspecified: Secondary | ICD-10-CM

## 2011-04-03 DIAGNOSIS — Z8544 Personal history of malignant neoplasm of other female genital organs: Secondary | ICD-10-CM | POA: Insufficient documentation

## 2011-04-03 DIAGNOSIS — K8000 Calculus of gallbladder with acute cholecystitis without obstruction: Secondary | ICD-10-CM | POA: Diagnosis present

## 2011-04-03 DIAGNOSIS — G43909 Migraine, unspecified, not intractable, without status migrainosus: Secondary | ICD-10-CM | POA: Insufficient documentation

## 2011-04-03 DIAGNOSIS — R112 Nausea with vomiting, unspecified: Secondary | ICD-10-CM

## 2011-04-03 HISTORY — DX: Other specified postprocedural states: Z98.890

## 2011-04-03 HISTORY — DX: Other specified postprocedural states: R11.2

## 2011-04-03 HISTORY — DX: Malignant neoplasm of vagina: C52

## 2011-04-03 HISTORY — DX: Migraine, unspecified, not intractable, without status migrainosus: G43.909

## 2011-04-03 HISTORY — DX: Shortness of breath: R06.02

## 2011-04-03 LAB — POCT I-STAT, CHEM 8
BUN: 7 mg/dL (ref 6–23)
Calcium, Ion: 1.16 mmol/L (ref 1.12–1.32)
Chloride: 107 mEq/L (ref 96–112)
Glucose, Bld: 158 mg/dL — ABNORMAL HIGH (ref 70–99)
Potassium: 4.3 mEq/L (ref 3.5–5.1)

## 2011-04-03 LAB — SURGICAL PCR SCREEN: MRSA, PCR: NEGATIVE

## 2011-04-03 LAB — CBC
Hemoglobin: 13.7 g/dL (ref 12.0–15.0)
MCHC: 34.2 g/dL (ref 30.0–36.0)
RDW: 12.6 % (ref 11.5–15.5)
WBC: 12.2 10*3/uL — ABNORMAL HIGH (ref 4.0–10.5)

## 2011-04-03 LAB — DIFFERENTIAL
Eosinophils Absolute: 0.1 10*3/uL (ref 0.0–0.7)
Lymphocytes Relative: 14 % (ref 12–46)
Lymphs Abs: 1.7 10*3/uL (ref 0.7–4.0)
Monocytes Absolute: 0.6 10*3/uL (ref 0.1–1.0)
Neutro Abs: 9.9 10*3/uL — ABNORMAL HIGH (ref 1.7–7.7)
Neutrophils Relative %: 81 % — ABNORMAL HIGH (ref 43–77)

## 2011-04-03 LAB — HEPATIC FUNCTION PANEL
ALT: 25 U/L (ref 0–35)
AST: 18 U/L (ref 0–37)
Alkaline Phosphatase: 72 U/L (ref 39–117)
Bilirubin, Direct: <0.1 mg/dL (ref 0.0–0.3)

## 2011-04-03 LAB — URINALYSIS, ROUTINE W REFLEX MICROSCOPIC
Bilirubin Urine: NEGATIVE
Glucose, UA: NEGATIVE mg/dL
Hgb urine dipstick: NEGATIVE
Protein, ur: NEGATIVE mg/dL
Specific Gravity, Urine: 1.015 (ref 1.005–1.030)
pH: 8.5 — ABNORMAL HIGH (ref 5.0–8.0)

## 2011-04-03 MED ORDER — MORPHINE SULFATE 4 MG/ML IJ SOLN
1.0000 mg | INTRAMUSCULAR | Status: DC | PRN
  Administered 2011-04-05: 2 mg via INTRAVENOUS
  Administered 2011-04-05: 4 mg via INTRAVENOUS
  Filled 2011-04-03 (×2): qty 1

## 2011-04-03 MED ORDER — CHLORHEXIDINE GLUCONATE CLOTH 2 % EX PADS: 6.0000 | MEDICATED_PAD | Freq: Every day | CUTANEOUS | Status: DC

## 2011-04-03 MED ORDER — CIPROFLOXACIN IN D5W 400 MG/200ML IV SOLN
400.0000 mg | Freq: Two times a day (BID) | INTRAVENOUS | Status: DC
  Administered 2011-04-03 – 2011-04-05 (×4): 400 mg via INTRAVENOUS
  Filled 2011-04-03 (×5): qty 200

## 2011-04-03 MED ORDER — MORPHINE SULFATE 4 MG/ML IJ SOLN
INTRAMUSCULAR | Status: AC
  Filled 2011-04-03: qty 1

## 2011-04-03 MED ORDER — ONDANSETRON HCL 4 MG/2ML IJ SOLN
4.0000 mg | INTRAMUSCULAR | Status: DC | PRN
  Administered 2011-04-03: 4 mg via INTRAVENOUS
  Filled 2011-04-03: qty 2

## 2011-04-03 MED ORDER — TOPIRAMATE 100 MG PO TABS
200.0000 mg | ORAL_TABLET | Freq: Every day | ORAL | Status: DC
  Administered 2011-04-03 – 2011-04-05 (×2): 200 mg via ORAL
  Filled 2011-04-03 (×5): qty 2

## 2011-04-03 MED ORDER — PROMETHAZINE HCL 25 MG/ML IJ SOLN
12.5000 mg | Freq: Four times a day (QID) | INTRAMUSCULAR | Status: DC | PRN
  Administered 2011-04-03: 12.5 mg via INTRAVENOUS
  Filled 2011-04-03: qty 1

## 2011-04-03 MED ORDER — MUPIROCIN 2 % EX OINT
1.0000 "application " | TOPICAL_OINTMENT | Freq: Two times a day (BID) | CUTANEOUS | Status: DC
  Administered 2011-04-03 – 2011-04-04 (×3): 1 via NASAL
  Filled 2011-04-03 (×2): qty 22

## 2011-04-03 MED ORDER — ACETAMINOPHEN 325 MG PO TABS: 650.0000 mg | ORAL_TABLET | Freq: Four times a day (QID) | ORAL | Status: DC | PRN

## 2011-04-03 MED ORDER — TOPIRAMATE 100 MG PO TABS: 200.0000 mg | ORAL_TABLET | Freq: Every day | ORAL | Status: DC

## 2011-04-03 MED ORDER — HYDROMORPHONE HCL PF 1 MG/ML IJ SOLN
0.5000 mg | INTRAMUSCULAR | Status: DC | PRN
  Administered 2011-04-03 (×2): 0.5 mg via INTRAVENOUS
  Filled 2011-04-03 (×2): qty 1

## 2011-04-03 MED ORDER — MORPHINE SULFATE 4 MG/ML IJ SOLN
4.0000 mg | Freq: Once | INTRAMUSCULAR | Status: AC
  Administered 2011-04-03: 4 mg via INTRAVENOUS

## 2011-04-03 MED ORDER — ACETAMINOPHEN 650 MG RE SUPP: 650.0000 mg | Freq: Four times a day (QID) | RECTAL | Status: DC | PRN

## 2011-04-03 MED ORDER — SUMATRIPTAN SUCCINATE 25 MG PO TABS
25.0000 mg | ORAL_TABLET | ORAL | Status: DC | PRN
  Administered 2011-04-04: 25 mg via ORAL
  Filled 2011-04-03: qty 1

## 2011-04-03 MED ORDER — ONDANSETRON HCL 4 MG/2ML IJ SOLN
4.0000 mg | Freq: Once | INTRAMUSCULAR | Status: AC
  Administered 2011-04-03 – 2011-04-04 (×2): 4 mg via INTRAVENOUS

## 2011-04-03 MED ORDER — KCL IN DEXTROSE-NACL 20-5-0.45 MEQ/L-%-% IV SOLN
INTRAVENOUS | Status: DC
  Administered 2011-04-03 – 2011-04-04 (×3): via INTRAVENOUS
  Filled 2011-04-03 (×9): qty 1000

## 2011-04-03 MED ORDER — ELETRIPTAN HYDROBROMIDE 40 MG PO TABS
40.0000 mg | ORAL_TABLET | Freq: Every day | ORAL | Status: DC | PRN
  Administered 2011-04-04: 40 mg via ORAL
  Filled 2011-04-03 (×2): qty 1

## 2011-04-03 MED ORDER — DIPHENHYDRAMINE HCL 12.5 MG/5ML PO ELIX
12.5000 mg | ORAL_SOLUTION | Freq: Four times a day (QID) | ORAL | Status: DC | PRN
  Filled 2011-04-03: qty 10

## 2011-04-03 MED ORDER — DIPHENHYDRAMINE HCL 50 MG/ML IJ SOLN: 12.5000 mg | Freq: Four times a day (QID) | INTRAMUSCULAR | Status: DC | PRN

## 2011-04-03 MED ORDER — ONDANSETRON HCL 4 MG/2ML IJ SOLN
INTRAMUSCULAR | Status: AC
  Filled 2011-04-03: qty 2

## 2011-04-03 MED ORDER — PANTOPRAZOLE SODIUM 40 MG IV SOLR
40.0000 mg | Freq: Every day | INTRAVENOUS | Status: DC
  Administered 2011-04-03 – 2011-04-04 (×2): 40 mg via INTRAVENOUS
  Filled 2011-04-03 (×3): qty 40

## 2011-04-03 MED ORDER — VENLAFAXINE HCL ER 150 MG PO CP24
150.0000 mg | ORAL_CAPSULE | Freq: Every day | ORAL | Status: DC
  Filled 2011-04-03 (×2): qty 1

## 2011-04-03 NOTE — H&P (Signed)
Amy Lang 25-May-1966  [id number].   Primary Care MD: Amy Lang Requesting MD: Dr. Effie Lang Admitting MD: Dr. Michaell Lang Chief Complaint/Reason for Consult: abdominal pain HPI: This is a 45 year old white female who has a history of exercise-induced asthma, vaginal cancer, and migraines.   She presented to the emergency department today after 4 days of intermittent right upper quadrant, epigastric, back pain. She admits to some nausea but no emesis. Her pain waxed and waned in the last 4 days until yesterday around 10:30 AM. At this time her pain began to worsen. As it progressed throughout the day she finally awoke her husband last night at 1:00 a.m.  Her pain at this time, was constant in the epigastrium and right upper quadrant.   Due to continued pain the patient presented to [facility] emergency department for further evaluation. Here she was evaluated with an abdominal ultrasound which revealed cholelithiasis and some mild gallbladder wall thickening consistent with possible early acute cholecystitis despite a negative Murphy sign on ultrasound. Her white blood cell count was noted to be 12,000. Her LFTs were otherwise normal. We have been asked to evaluate the patient at this time for surgical admission.  Review of systems: General: The patient denies any fevers chills or night sweats. HEENT: The patient denies any hearing loss, rhinorrhea, sore throat, or trauma to her head. Heart: The patient denies any heart murmurs chest pain, or hypertension. Lungs: The patient does admit to some shortness of breath with breathing secondary to abdominal pain otherwise she denies wheezes or coughs. GI: Please see history of present illness GU: The patient denies any urinary symptoms consistent with dysuria, hematuria. Musculoskeletal: The patient denies any muscle weakness pain or stiffness Neurologic: The patient denies any numbness tingling or tremors Psychiatric: The patient denies any mood or  anxiety disorders. She does admit to some depression. Please see history of present illness for further review of systems otherwise all others are negative.  Family History  Problem Relation Age of Onset  . Heart disease Mother   . COPD Mother   . Cancer Father   . Heart disease Maternal Grandmother   . Heart disease Maternal Grandfather   . Heart disease Paternal Grandmother   . Heart disease Paternal Grandfather     Past Medical History  Diagnosis Date  . Vaginal cancer   . Asthma   . Migraines     Past Surgical History  Procedure Date  . Knee surgery   . Abdominal hysterectomy     still has bilateral ovaries    Social History:  reports that she has never smoked. She does not have any smokeless tobacco history on file. She reports that she does not drink alcohol or use illicit drugs. She works as a Producer, television/film/video.  Allergies:  Allergies  Allergen Reactions  . Butorphanol Tartrate     REACTION: hallucinations  . Codeine Sulfate   . Darvocet (Propoxyphene N-Acetaminophen)   . Penicillins   . Sulfadiazine     Medications Prior to Admission  Medication Dose Route Frequency Provider Last Rate Last Dose  . HYDROmorphone (DILAUDID) injection 0.5 mg  0.5 mg Intravenous Q2H PRN Amy Mackie, MD   0.5 mg at 04/03/11 1003  . morphine 4 MG/ML injection 4 mg  4 mg Intravenous Once Amy Mackie, MD   4 mg at 04/03/11 0615  . morphine 4 MG/ML injection           . ondansetron (ZOFRAN) 4 MG/2ML injection           .  ondansetron (ZOFRAN) injection 4 mg  4 mg Intravenous Once Amy Mackie, MD   4 mg at 04/03/11 0615  . ondansetron (ZOFRAN) injection 4 mg  4 mg Intravenous Q4H PRN Amy Mackie, MD   4 mg at 04/03/11 0741   Medications Prior to Admission  Medication Sig Dispense Refill  . topiramate (TOPAMAX) 200 MG tablet TAKE 1 TABLET BY MOUTH AT BEDTIME  30 tablet  3  . venlafaxine (EFFEXOR-XR) 150 MG 24 hr capsule        . DISCONTD: venlafaxine (EFFEXOR-XR) 150 MG 24 hr capsule  TAKE ONE CAPSULE TWICE A DAY  60 capsule  5  . promethazine (PHENERGAN) 25 MG tablet Take 25-50 mg by mouth every 6 (six) hours as needed. 1-2 po qid prn vomiting from migraine         Blood pressure 124/76, pulse 85, temperature 98.3 F (36.8 C), temperature source Oral, resp. rate 20, SpO2 99.00%. Physical Exam: Gen.: Amy Lang is a well-developed well-nourished 45 year old white female who is currently sitting up in bed in no acute distress. HEENT: Head is normocephalic atraumatic sclera nonicteric pupils are equal round and reactive to light. Ears and nose without any obvious masses or lesions. No rhinorrhea. Mouth is pink throat shows no exudate. Heart: regular rate and rhythm normal S1-S2 no murmurs gallops or rubs are noted. She does have palpable carotid radial and pedal pulses bilaterally. Lungs: Are clear to auscultation bilaterally with no wheezes, rhonchi, or rales noted. Respiratory effort is non-labored. Abdomen: Is soft nondistended with active bowel sounds. She does have tenderness in the right upper quadrant as well as the epigastric region. She does have a positive Murphy sign. Otherwise no masses hernias or organomegaly are noted. Musculoskeletal: All 4 extremities are symmetrical with no cyanosis, clubbing, edema. Skin: Is warm and dry with no masses lesions or rashes Psychiatric: The patient is alert and oriented x3 with an appropriate affect.  Results for orders placed during the hospital encounter of 04/03/11 (from the past 48 hour(s))  URINALYSIS, ROUTINE W REFLEX MICROSCOPIC     Status: Abnormal   Collection Time   04/03/11  3:55 AM      Component Value Range Comment   Color, Urine YELLOW  YELLOW     Appearance TURBID (*) CLEAR     Specific Gravity, Urine 1.015  1.005 - 1.030     pH 8.5 (*) 5.0 - 8.0     Glucose, UA NEGATIVE  NEGATIVE (mg/dL)    Hgb urine dipstick NEGATIVE  NEGATIVE     Bilirubin Urine NEGATIVE  NEGATIVE     Ketones, ur NEGATIVE  NEGATIVE (mg/dL)     Protein, ur NEGATIVE  NEGATIVE (mg/dL)    Urobilinogen, UA 0.2  0.0 - 1.0 (mg/dL)    Nitrite NEGATIVE  NEGATIVE     Leukocytes, UA NEGATIVE  NEGATIVE    URINE MICROSCOPIC-ADD ON     Status: Normal   Collection Time   04/03/11  3:55 AM      Component Value Range Comment   WBC, UA 0-2  <3 (WBC/hpf)    RBC / HPF 0-2  <3 (RBC/hpf)    Urine-Other AMORPHOUS URATES/PHOSPHATES     CBC     Status: Abnormal   Collection Time   04/03/11  4:20 AM      Component Value Range Comment   WBC 12.2 (*) 4.0 - 10.5 (K/uL)    RBC 4.51  3.87 - 5.11 (MIL/uL)    Hemoglobin 13.7  12.0 - 15.0 (g/dL)    HCT 16.1  09.6 - 04.5 (%)    MCV 88.9  78.0 - 100.0 (fL)    MCH 30.4  26.0 - 34.0 (pg)    MCHC 34.2  30.0 - 36.0 (g/dL)    RDW 40.9  81.1 - 91.4 (%)    Platelets 227  150 - 400 (K/uL)   DIFFERENTIAL     Status: Abnormal   Collection Time   04/03/11  4:20 AM      Component Value Range Comment   Neutrophils Relative 81 (*) 43 - 77 (%)    Neutro Abs 9.9 (*) 1.7 - 7.7 (K/uL)    Lymphocytes Relative 14  12 - 46 (%)    Lymphs Abs 1.7  0.7 - 4.0 (K/uL)    Monocytes Relative 5  3 - 12 (%)    Monocytes Absolute 0.6  0.1 - 1.0 (K/uL)    Eosinophils Relative 0  0 - 5 (%)    Eosinophils Absolute 0.1  0.0 - 0.7 (K/uL)    Basophils Relative 0  0 - 1 (%)    Basophils Absolute 0.0  0.0 - 0.1 (K/uL)   POCT I-STAT, CHEM 8     Status: Abnormal   Collection Time   04/03/11  4:27 AM      Component Value Range Comment   Sodium 140  135 - 145 (mEq/L)    Potassium 4.3  3.5 - 5.1 (mEq/L)    Chloride 107  96 - 112 (mEq/L)    BUN 7  6 - 23 (mg/dL)    Creatinine, Ser 7.82  0.50 - 1.10 (mg/dL)    Glucose, Bld 956 (*) 70 - 99 (mg/dL)    Calcium, Ion 2.13  1.12 - 1.32 (mmol/L)    TCO2 22  0 - 100 (mmol/L)    Hemoglobin 14.3  12.0 - 15.0 (g/dL)    HCT 08.6  57.8 - 46.9 (%)   HEPATIC FUNCTION PANEL     Status: Abnormal   Collection Time   04/03/11  5:58 AM      Component Value Range Comment   Total Protein 7.4  6.0 - 8.3  (g/dL)    Albumin 3.8  3.5 - 5.2 (g/dL)    AST 18  0 - 37 (U/L)    ALT 25  0 - 35 (U/L)    Alkaline Phosphatase 72  39 - 117 (U/L)    Total Bilirubin 0.2 (*) 0.3 - 1.2 (mg/dL)    Bilirubin, Direct <6.2  0.0 - 0.3 (mg/dL) REPEATED TO VERIFY   Indirect Bilirubin NOT CALCULATED  0.3 - 0.9 (mg/dL)    US Abdomen Complete  04/03/2011  *RADIOLOGY REPORT*  Clinical Data:  Right upper quadrant abdominal pain, evaluate for gallstones  COMPLETE ABDOMINAL ULTRASOUND  Comparison:  None.  Findings:  Gallbladder:  1.7 cm stone in the gallbladder neck.  Mild gallbladder wall thickening/edema, measuring up to 6 mm.  Negative sonographic Murphy's sign.  Common bile duct:  Measures 4 mm.  Liver:  Coarse, hyperechoic hepatic parenchyma, possibly reflecting hepatic steatosis.  No focal hepatic lesions are seen.  IVC:  Appears normal.  Pancreas:  Incompletely visualized but grossly unremarkable.  Spleen:  Measures 10.1 cm.  Right Kidney:  Measures 11.9 cm.  No mass or hydronephrosis.  Left Kidney:  Measures 12.7 cm.  No mass or hydronephrosis.  Abdominal aorta:  No aneurysm identified.  IMPRESSION: Cholelithiasis with mild gallbladder wall thickening/edema.  The appearance is worrisome for  early acute cholecystitis; however, there is a negative sonographic Murphy's sign.  Consider hepatobiliary nuclear medicine scintigraphy for further evaluation as clinically warranted.  Original Report Authenticated By: Charline Bills, M.D.       Assessment/Plan 1. Early acute cholecystitis with cholelithiasis 2. History of migraines 3. History of vaginal cancer, patient has undergone chemotherapy treatments. 4. Seasonal allergies 5. Exercise-induced asthma  Plan:  We'll get this patient admitted to a regular surgical floor.  We will allow the patient to have clear liquids tonight.  We will make her n.p.o. after midnight in preparation for surgical intervention in the morning.  We will place her on IV fluids as well as IV  antibiotics and when necessary medications as needed for pain and nausea.   I have discussed with the patient as well as her husband that she will need surgical intervention including a laparoscopic cholecystectomy to resolve her problem. I have discussed the possibility of a single site versus multisite laparoscopic cholecystectomy. The patient and her husband understand and agree with this plan.  OSBORNE,KELLY E 04/03/2011, 1:07 PM

## 2011-04-03 NOTE — ED Provider Notes (Signed)
History     CSN: [id number] Arrival date & time: 04/03/2011  3:37 AM   First MD Initiated Contact with Patient 04/03/11 [phone]      Chief Complaint  Patient presents with  . Abdominal Pain     HPI 45 year old female presents to the emergency department with complaint of upper abdominal pain intermittently over the last 3 or 4 nights. Over the last several hours pain has been worse and unrelenting. Pain mainly in the right upper quadrant. Patient is unsure if it's worse after eating. She has not had fever. She has had nausea without vomiting. Patient reports some sores to her mouth, and took milk of magnesia thinking that she was constipated no prior history of gallstones. Patient reports pain in right upper quadrant radiates back toward her back.  Past Medical History  Diagnosis Date  . Vaginal cancer     Past Surgical History  Procedure Date  . Abdominal hysterectomy   . Knee surgery     History reviewed. No pertinent family history.  History  Substance Use Topics  . Smoking status: Never Smoker   . Smokeless tobacco: Not on file  . Alcohol Use: No    OB History    Grav Para Term Preterm Abortions TAB SAB Ect Mult Living                  Review of Systems  Constitutional: Negative.   HENT: Negative.   Eyes: Negative.   Respiratory: Negative.   Cardiovascular: Negative.   Genitourinary: Negative.   Musculoskeletal: Negative.   Skin: Negative.   Neurological: Negative.   Hematological: Negative.   Psychiatric/Behavioral: Negative.   All other systems reviewed and are negative.    Allergies  Butorphanol tartrate; Codeine sulfate; Penicillins; and Sulfadiazine  Home Medications   Current Outpatient Rx  Name Route Sig Dispense Refill  . ASPIRIN EC 81 MG PO TBEC Oral Take 81 mg by mouth daily.      [name] CETIRIZINE HCL 10 MG PO TABS Oral Take 10 mg by mouth daily.      [name] VITAMIN D 1000 UNITS PO TABS Oral Take 1,000 Units by mouth daily.      [name] ELETRIPTAN  HYDROBROMIDE 40 MG PO TABS Oral One tablet by mouth as needed for migraine headache.  If the headache improves and then returns, dose may be repeated after 2 hours have elapsed since first dose (do not exceed 80 mg per day). For migraines...may repeat in 2 hours if necessary     . THERA M PLUS PO TABS Oral Take 1 tablet by mouth daily.      [name] VITAMIN B-1 100 MG PO TABS Oral Take 100 mg by mouth daily.      . TOPIRAMATE 200 MG PO TABS  TAKE 1 TABLET BY MOUTH AT BEDTIME 30 tablet 3  . VENLAFAXINE HCL 150 MG PO CP24       . VITAMIN C 500 MG PO TABS Oral Take 500 mg by mouth daily.      [name] PROMETHAZINE HCL 25 MG PO TABS Oral Take 25-50 mg by mouth every 6 (six) hours as needed. 1-2 po qid prn vomiting from migraine     . SUMATRIPTAN SUCCINATE 25 MG PO TABS Oral Take 25 mg by mouth every 2 (two) hours as needed. For migraine       BP 163/79  Pulse 83  Temp(Src) 98.3 F (36.8 C) (Oral)  Resp 22  SpO2 100%  Physical Exam  Constitutional:  She is oriented to person, place, and time. She appears well-developed and well-nourished.  HENT:  Head: Normocephalic and atraumatic.  Right Ear: External ear normal.  Left Ear: External ear normal.  Nose: Nose normal.  Mouth/Throat: Oropharynx is clear and moist.  Eyes: Conjunctivae and EOM are normal. Pupils are equal, round, and reactive to light.  Neck: Normal range of motion. Neck supple. No JVD present. No tracheal deviation present. No thyromegaly present.  Cardiovascular: Normal rate, regular rhythm, normal heart sounds and intact distal pulses.  Exam reveals no gallop and no friction rub.   No murmur heard. Pulmonary/Chest: Effort normal and breath sounds normal. No stridor. No respiratory distress. She has no wheezes. She has no rales. She exhibits no tenderness.  Abdominal: Soft. Bowel sounds are normal. She exhibits no distension and no mass. There is tenderness. There is no rebound and no guarding.       Significant abdominal pain throughout  slightly worse in the right upper quadrant with some voluntary guarding  Musculoskeletal: Normal range of motion. She exhibits no edema and no tenderness.  Lymphadenopathy:    She has no cervical adenopathy.  Neurological: She is oriented to person, place, and time.  Skin: Skin is dry. No rash noted. No erythema. No pallor.  Psychiatric: She has a normal mood and affect. Her behavior is normal. Judgment and thought content normal.    ED Course  Procedures (including critical care time)  Labs Reviewed  CBC - Abnormal; Notable for the following:    WBC 12.2 (*)    All other components within normal limits  DIFFERENTIAL - Abnormal; Notable for the following:    Neutrophils Relative 81 (*)    Neutro Abs 9.9 (*)    All other components within normal limits  URINALYSIS, ROUTINE W REFLEX MICROSCOPIC - Abnormal; Notable for the following:    Appearance TURBID (*)    pH 8.5 (*)    All other components within normal limits  POCT I-STAT, CHEM 8 - Abnormal; Notable for the following:    Glucose, Bld 158 (*)    All other components within normal limits  HEPATIC FUNCTION PANEL - Abnormal; Notable for the following:    Total Bilirubin 0.2 (*)    All other components within normal limits  URINE MICROSCOPIC-ADD ON  I-STAT, CHEM 8      MDM  45 year old female with abdominal pain concerning for cholecystitis versus cholelithiasis with elevated white count. She has normal LFTs. She is pending ultrasound. Patient has been given pain and nausea medicines.        Olivia Mackie, MD 04/03/11 [phone]

## 2011-04-03 NOTE — ED Notes (Signed)
Pt reports having abdominal pain x 3 days with increased pain at night-waking her up at 1am-ish.  Pt reports that the pain starts in her epigastric area and radiates around her (R) flank and into her back.  Pt extremely tender on palpation, guarding.  Pt reports nausea but denies vomiting.  Bowel movements have been hard, pt reports taking MOM today with no relief from pain but does report a normal BM.  Skin warm, dry and intact.  Neuro intact.

## 2011-04-03 NOTE — ED Notes (Signed)
C/o RUQ pain, onset Friday night, woke pt up, comes and goes over last 3d, now constant and has not gone away, also nausea and sob, (denies: vomiting, bleeding or fever),  Mentions diarrhea r/t med (MOM), last BM 0045 (watery).

## 2011-04-03 NOTE — ED Notes (Signed)
5100 rn given report.

## 2011-04-03 NOTE — ED Notes (Signed)
Pt watching tv and has no needs at this time; family at bedside

## 2011-04-03 NOTE — ED Provider Notes (Signed)
  Physical Exam  BP 134/78  Pulse 79  Temp(Src) 98.3 F (36.8 C) (Oral)  Resp 18  SpO2 98%  Physical Exam Patient is alert, states that the most recent pain medicine has almost eliminated her pain. She would like to have the GB removed today.   ED Course  Procedures  MDM Symptomatic GB disease.     Flint Melter, MD 04/04/11 2035

## 2011-04-03 NOTE — ED Notes (Signed)
Patient transported to Ultrasound via wheelchair; ultrasound tech will transport pt back to room when procedure is completed

## 2011-04-04 ENCOUNTER — Encounter (HOSPITAL_COMMUNITY)
Admission: EM | Disposition: A | Discharge status: Home / Self Care | Payer: Self-pay | Source: Home / Self Care | Attending: Emergency Medicine

## 2011-04-04 ENCOUNTER — Encounter (HOSPITAL_COMMUNITY): Payer: Self-pay | Admitting: Certified Registered"

## 2011-04-04 ENCOUNTER — Inpatient Hospital Stay (HOSPITAL_COMMUNITY): Payer: BC Managed Care – PPO

## 2011-04-04 ENCOUNTER — Other Ambulatory Visit (INDEPENDENT_AMBULATORY_CARE_PROVIDER_SITE_OTHER): Payer: Self-pay | Admitting: Surgery

## 2011-04-04 ENCOUNTER — Other Ambulatory Visit: Payer: Self-pay | Admitting: Surgery

## 2011-04-04 ENCOUNTER — Inpatient Hospital Stay (HOSPITAL_COMMUNITY): Payer: BC Managed Care – PPO | Admitting: Certified Registered"

## 2011-04-04 DIAGNOSIS — K8010 Calculus of gallbladder with chronic cholecystitis without obstruction: Secondary | ICD-10-CM

## 2011-04-04 HISTORY — PX: CHOLECYSTECTOMY: SHX55

## 2011-04-04 LAB — COMPREHENSIVE METABOLIC PANEL
Albumin: 3.2 g/dL — ABNORMAL LOW (ref 3.5–5.2)
Alkaline Phosphatase: 57 U/L (ref 39–117)
BUN: 5 mg/dL — ABNORMAL LOW (ref 6–23)
Calcium: 8.5 mg/dL (ref 8.4–10.5)
GFR calc Af Amer: >90 mL/min (ref >90)
Glucose, Bld: 119 mg/dL — ABNORMAL HIGH (ref 70–99)
Potassium: 4 mEq/L (ref 3.5–5.1)
Sodium: 139 mEq/L (ref 135–145)
Total Protein: 6.4 g/dL (ref 6.0–8.3)

## 2011-04-04 LAB — CBC
HCT: 39.4 % (ref 36.0–46.0)
MCH: 30.1 pg (ref 26.0–34.0)
MCHC: 33.5 g/dL (ref 30.0–36.0)
RDW: 12.8 % (ref 11.5–15.5)

## 2011-04-04 SURGERY — LAPAROSCOPIC CHOLECYSTECTOMY SINGLE SITE
Anesthesia: General | Site: Abdomen | Wound class: Clean Contaminated

## 2011-04-04 MED ORDER — ASPIRIN-ACETAMINOPHEN-CAFFEINE 250-250-65 MG PO TABS
1.0000 | ORAL_TABLET | Freq: Three times a day (TID) | ORAL | Status: DC | PRN
  Filled 2011-04-04: qty 1

## 2011-04-04 MED ORDER — PROPOFOL 10 MG/ML IV EMUL
INTRAVENOUS | Status: DC | PRN
  Administered 2011-04-04: 180 mg via INTRAVENOUS

## 2011-04-04 MED ORDER — FENTANYL CITRATE 0.05 MG/ML IJ SOLN
INTRAMUSCULAR | Status: DC | PRN
  Administered 2011-04-04: 150 ug via INTRAVENOUS
  Administered 2011-04-04: 50 ug via INTRAVENOUS

## 2011-04-04 MED ORDER — GLYCOPYRROLATE 0.2 MG/ML IJ SOLN
INTRAMUSCULAR | Status: DC | PRN
  Administered 2011-04-04: .4 mg via INTRAVENOUS

## 2011-04-04 MED ORDER — KETOROLAC TROMETHAMINE 60 MG/2ML IM SOLN
INTRAMUSCULAR | Status: DC | PRN
  Administered 2011-04-04: 30 mg via INTRAMUSCULAR

## 2011-04-04 MED ORDER — ONDANSETRON HCL 4 MG/2ML IJ SOLN
4.0000 mg | INTRAMUSCULAR | Status: DC | PRN
  Administered 2011-04-04: 4 mg via INTRAVENOUS
  Filled 2011-04-04 (×2): qty 2

## 2011-04-04 MED ORDER — BUPIVACAINE-EPINEPHRINE 0.25% -1:200000 IJ SOLN
INTRAMUSCULAR | Status: DC | PRN
  Administered 2011-04-04: 30 mL

## 2011-04-04 MED ORDER — MIDAZOLAM HCL 5 MG/5ML IJ SOLN
INTRAMUSCULAR | Status: DC | PRN
  Administered 2011-04-04: 2 mg via INTRAVENOUS

## 2011-04-04 MED ORDER — OXYCODONE HCL 5 MG PO TABS
5.0000 mg | ORAL_TABLET | ORAL | Status: DC | PRN
  Administered 2011-04-05: 10 mg via ORAL
  Filled 2011-04-04: qty 2

## 2011-04-04 MED ORDER — ONDANSETRON HCL 4 MG/2ML IJ SOLN
INTRAMUSCULAR | Status: AC
  Administered 2011-04-04: 4 mg via INTRAVENOUS
  Filled 2011-04-04: qty 2

## 2011-04-04 MED ORDER — BISACODYL 10 MG RE SUPP: 10.0000 mg | Freq: Two times a day (BID) | RECTAL | Status: DC | PRN

## 2011-04-04 MED ORDER — LACTATED RINGERS IV SOLN
INTRAVENOUS | Status: DC | PRN
  Administered 2011-04-04 (×2): via INTRAVENOUS

## 2011-04-04 MED ORDER — ALUM & MAG HYDROXIDE-SIMETH 400-400-40 MG/5ML PO SUSP
30.0000 mL | Freq: Four times a day (QID) | ORAL | Status: DC | PRN
  Filled 2011-04-04: qty 30

## 2011-04-04 MED ORDER — HYDROMORPHONE HCL PF 1 MG/ML IJ SOLN
0.2500 mg | INTRAMUSCULAR | Status: DC | PRN
  Administered 2011-04-04 (×2): 0.5 mg via INTRAVENOUS

## 2011-04-04 MED ORDER — ALBUTEROL SULFATE (2.5 MG/3ML) 0.083% IN NEBU
INHALATION_SOLUTION | RESPIRATORY_TRACT | Status: DC | PRN
  Administered 2011-04-04: 3 mL via RESPIRATORY_TRACT

## 2011-04-04 MED ORDER — HYDROMORPHONE HCL PF 1 MG/ML IJ SOLN: 0.5000 mg | INTRAMUSCULAR | Status: DC | PRN

## 2011-04-04 MED ORDER — SODIUM CHLORIDE 0.9 % IR SOLN
Status: DC | PRN
  Administered 2011-04-04 (×2): 1000 mL

## 2011-04-04 MED ORDER — CIPROFLOXACIN IN D5W 400 MG/200ML IV SOLN
INTRAVENOUS | Status: AC
  Filled 2011-04-04: qty 200

## 2011-04-04 MED ORDER — SUCCINYLCHOLINE CHLORIDE 20 MG/ML IJ SOLN
INTRAMUSCULAR | Status: DC | PRN
  Administered 2011-04-04: 100 mg via INTRAVENOUS

## 2011-04-04 MED ORDER — DEXAMETHASONE SODIUM PHOSPHATE 4 MG/ML IJ SOLN
INTRAMUSCULAR | Status: DC | PRN
  Administered 2011-04-04: 8 mg via INTRAVENOUS

## 2011-04-04 MED ORDER — NEOSTIGMINE METHYLSULFATE 1 MG/ML IJ SOLN
INTRAMUSCULAR | Status: DC | PRN
  Administered 2011-04-04: 3 mg via INTRAVENOUS

## 2011-04-04 MED ORDER — ROCURONIUM BROMIDE 100 MG/10ML IV SOLN
INTRAVENOUS | Status: DC | PRN
  Administered 2011-04-04: 30 mg via INTRAVENOUS

## 2011-04-04 MED ORDER — PSYLLIUM 95 % PO PACK
1.0000 | PACK | Freq: Two times a day (BID) | ORAL | Status: DC
  Filled 2011-04-04 (×4): qty 1

## 2011-04-04 MED ORDER — IOHEXOL 300 MG/ML  SOLN
INTRAMUSCULAR | Status: DC | PRN
  Administered 2011-04-04: 50 mL

## 2011-04-04 MED ORDER — VENLAFAXINE HCL ER 150 MG PO CP24
300.0000 mg | ORAL_CAPSULE | Freq: Every day | ORAL | Status: DC
  Administered 2011-04-04 – 2011-04-05 (×2): 300 mg via ORAL
  Filled 2011-04-04 (×3): qty 2

## 2011-04-04 MED ORDER — KCL IN DEXTROSE-NACL 20-5-0.45 MEQ/L-%-% IV SOLN
INTRAVENOUS | Status: AC
  Filled 2011-04-04: qty 1000

## 2011-04-04 MED ORDER — HYDROMORPHONE BOLUS VIA INFUSION: 0.5000 mg | INTRAVENOUS | Status: DC | PRN

## 2011-04-04 MED ORDER — ONDANSETRON HCL 4 MG/2ML IJ SOLN
INTRAMUSCULAR | Status: DC | PRN
  Administered 2011-04-04: 4 mg via INTRAVENOUS

## 2011-04-04 MED ORDER — CIPROFLOXACIN IN D5W 400 MG/200ML IV SOLN
INTRAVENOUS | Status: DC | PRN
  Administered 2011-04-04: 400 mg via INTRAVENOUS

## 2011-04-04 MED ORDER — NAPROXEN 500 MG PO TABS
500.0000 mg | ORAL_TABLET | Freq: Two times a day (BID) | ORAL | Status: DC
  Administered 2011-04-05: 500 mg via ORAL
  Filled 2011-04-04 (×4): qty 1

## 2011-04-04 SURGICAL SUPPLY — 49 items
APPLIER CLIP 5 13 M/L LIGAMAX5 (MISCELLANEOUS) ×2
APR CLP MED LRG 5 ANG JAW (MISCELLANEOUS) ×1
BAG SPEC RTRVL LRG 6X4 10 (ENDOMECHANICALS) ×1
BLADE SURG ROTATE 9660 (MISCELLANEOUS) IMPLANT
CANISTER SUCTION 2500CC (MISCELLANEOUS) ×2 IMPLANT
CHLORAPREP W/TINT 26ML (MISCELLANEOUS) ×2 IMPLANT
CLIP APPLIE 5 13 M/L LIGAMAX5 (MISCELLANEOUS) ×1 IMPLANT
CLOTH BEACON ORANGE TIMEOUT ST (SAFETY) ×2 IMPLANT
COVER MAYO STAND STRL (DRAPES) ×1 IMPLANT
COVER SURGICAL LIGHT HANDLE (MISCELLANEOUS) ×2 IMPLANT
DECANTER SPIKE VIAL GLASS SM (MISCELLANEOUS) ×2 IMPLANT
DRAPE C-ARM 42X72 X-RAY (DRAPES) IMPLANT
DRAPE WARM FLUID 44X44 (DRAPE) ×1 IMPLANT
DRSG TEGADERM 4X4.75 (GAUZE/BANDAGES/DRESSINGS) ×2 IMPLANT
ELECT REM PT RETURN 9FT ADLT (ELECTROSURGICAL) ×2
ELECTRODE REM PT RTRN 9FT ADLT (ELECTROSURGICAL) ×1 IMPLANT
ENDOLOOP SUT PDS II  0 18 (SUTURE)
ENDOLOOP SUT PDS II 0 18 (SUTURE) IMPLANT
GAUZE SPONGE 2X2 8PLY STRL LF (GAUZE/BANDAGES/DRESSINGS) ×1 IMPLANT
GLOVE BIOGEL PI IND STRL 7.5 (GLOVE) IMPLANT
GLOVE BIOGEL PI IND STRL 8 (GLOVE) ×1 IMPLANT
GLOVE BIOGEL PI INDICATOR 7.5 (GLOVE) ×1
GLOVE BIOGEL PI INDICATOR 8 (GLOVE) ×1
GLOVE ECLIPSE 8.0 STRL XLNG CF (GLOVE) ×1 IMPLANT
GLOVE SURG SS PI 7.0 STRL IVOR (GLOVE) ×1 IMPLANT
GLOVE SURG SS PI 7.5 STRL IVOR (GLOVE) ×1 IMPLANT
GLOVE SURG SS PI 8.0 STRL IVOR (GLOVE) ×1 IMPLANT
GOWN PREVENTION PLUS XLARGE (GOWN DISPOSABLE) ×2 IMPLANT
GOWN STRL NON-REIN LRG LVL3 (GOWN DISPOSABLE) ×4 IMPLANT
KIT BASIN OR (CUSTOM PROCEDURE TRAY) ×2 IMPLANT
KIT ROOM TURNOVER OR (KITS) ×2 IMPLANT
NEEDLE 22X1 1/2 (OR ONLY) (NEEDLE) ×2 IMPLANT
NS IRRIG 1000ML POUR BTL (IV SOLUTION) ×2 IMPLANT
PAD ARMBOARD 7.5X6 YLW CONV (MISCELLANEOUS) ×2 IMPLANT
POUCH SPECIMEN RETRIEVAL 10MM (ENDOMECHANICALS) ×1 IMPLANT
SCALPEL HARMONIC ACE (MISCELLANEOUS) ×2 IMPLANT
SCISSORS LAP 5X35 DISP (ENDOMECHANICALS) IMPLANT
SET CHOLANGIOGRAPH 5 50 .035 (SET/KITS/TRAYS/PACK) IMPLANT
SET IRRIG TUBING LAPAROSCOPIC (IRRIGATION / IRRIGATOR) ×2 IMPLANT
SPECIMEN JAR SMALL (MISCELLANEOUS) ×2 IMPLANT
SPONGE GAUZE 2X2 STER 10/PKG (GAUZE/BANDAGES/DRESSINGS) ×1
SUT MNCRL AB 4-0 PS2 18 (SUTURE) ×2 IMPLANT
SUT VICRYL 0 TIES 12 18 (SUTURE) IMPLANT
TOWEL OR 17X26 10 PK STRL BLUE (TOWEL DISPOSABLE) ×2 IMPLANT
TRAY LAPAROSCOPIC (CUSTOM PROCEDURE TRAY) ×2 IMPLANT
TROCAR 5M 150ML BLDLS (TROCAR) IMPLANT
TROCAR XCEL NON-BLD 5MMX100MML (ENDOMECHANICALS) IMPLANT
TROCAR Z-THREAD FIOS 5X100MM (TROCAR) ×2 IMPLANT
WATER STERILE IRR 1000ML POUR (IV SOLUTION) IMPLANT

## 2011-04-04 NOTE — Anesthesia Postprocedure Evaluation (Signed)
  Anesthesia Post-op Note  Patient: Amy Lang  Procedure(s) Performed:  LAPAROSCOPIC CHOLECYSTECTOMY SINGLE PORT - IOC   Patient Location: PACU  Anesthesia Type: General  Level of Consciousness: alert   Airway and Oxygen Therapy: Patient Spontanous Breathing and Patient connected to nasal cannula oxygen  Post-op Pain: mild  Post-op Assessment: Post-op Vital signs reviewed and Pain level controlled  Post-op Vital Signs: stable  Complications: No apparent anesthesia complications

## 2011-04-04 NOTE — Progress Notes (Signed)
Day of Surgery  Subjective: Pt c/o HA and nausea, otherwise feels ok.  Objective: Vital signs in last 24 hours: Temp:  [97.9 F (36.6 C)-98.7 F (37.1 C)] 97.9 F (36.6 C) (11/05 2145) Pulse Rate:  [72-85] 79  (11/05 2145) Resp:  [16-20] 16  (11/05 2145) BP: (119-156)/(57-81) 128/57 mmHg (11/05 2145) SpO2:  [98 %-100 %] 99 % (11/05 2145) Weight:  [179 lb (81.194 kg)] 179 lb (81.194 kg) (11/06 0511) Last BM Date: 04/03/11  Intake/Output from previous day: 11/05 0701 - 11/06 0700 In: 1463 [P.O.:120; I.V.:1343] Out: 1000 [Urine:1000] Intake/Output this shift:    PE: General: Pt awake/alert/oriented x4 in no major acute distress Eyes: PERRL, normal EOM. Neuro: CN II-XII intact w/o focal sensory/motor deficits. Lymph: No head/neck/groin lymphadenopathy Psych:  No delerium/psychosis/paranoia HEENT: Normocephalic, Mucus membranes moist.  No thrush Neck: Supple, No tracheal deviation Chest: No pain w good excursion CV:  Pulses intact.  Regular rhythm Abdomen: soft, nontender x at RUQ mild TTP.  nondistended.  No incarcerated hernias. Ext:  SCDs BLE.  No mjr edema.  No cyanosis Skin: No petechiae / purpurae   Lab Results:   Basename 04/04/11 0640 04/03/11 0427 04/03/11 0420  WBC 6.9 -- 12.2*  HGB 13.2 14.3 --  HCT 39.4 42.0 --  PLT 197 -- 227   BMET  Basename 04/04/11 0640 04/03/11 0427  NA 139 140  K 4.0 4.3  CL 109 107  CO2 22 --  GLUCOSE 119* 158*  BUN 5* 7  CREATININE 0.66 0.80  CALCIUM 8.5 --   PT/INR No results found for this basename: LABPROT:2,INR:2 in the last 72 hours   Studies/Results: @RISRSLT24 @  Anti-infectives: Anti-infectives    None       Assessment/Plan  1.early acute cholecystitis with cholelithiasis 2. Migraines  Plan: To OR for lap cholecystectomy w IOC  The anatomy & physiology of hepatobiliary & pancreatic function was discussed.  The pathophysiology of gallbladder dysfunction was discussed.  Natural history risks without  surgery was discussed.   I feel the risks of no intervention will lead to serious problems that outweigh the operative risks; therefore, I recommended cholecystectomy to remove the pathology.  I explained laparoscopic techniques with possible need for an open approach.  Probable cholangiogram to evaluate the bilary tract was explained as well.  I will start w a single site approach.  Risks such as bleeding, infection, abscess, leak, injury to other organs, need for further treatment, heart attack, death, and other risks were discussed.  I feel that there is a good likelihood that this intervention will address the problem.   Possibility that this will not correct all abdominal symptoms was explained.  Goals of post-operative recovery were discussed as well.  We will work to minimize complications.  An educational handout further explaining the pathology and treatment options was given as well.  Questions were answered.  The patient expresses understanding & wishes to proceed with surgery.      LOS: 1 day    OSBORNE,KELLY E 04/04/2011  Ardeth Sportsman, M.D., F.A.C.S. Gastrointestinal and Minimally Invasive Surgery [facility] [address] [place], [place] [address] [phone] Main / Paging [phone] Voice Mail

## 2011-04-04 NOTE — Op Note (Signed)
Laparoscopic Cholecystectomy with IOC Procedure Note  Indications: This patient presents with symptomatic gallbladder disease & probable cholecystitis and will undergo laparoscopic cholecystectomy.  Pre-operative Diagnosis: Calculus of gallbladder and bile duct with acute and chronic cholecystitis, without mention of obstruction  Post-operative Diagnosis: Calculus of gallbladder and bile duct with acute and chronic cholecystitis, without mention of obstruction  Surgeon: Marvon Shillingburg C.   Assistants: RN  Anesthesia: General endotracheal anesthesia  ASA Class:   Procedure Details   Indications:  The patient is a pleasant 45 year old female who developed right upper quadrant abdominal pain. Workup was concerning for cholecystitis. The rest of the differential diagnosis did not seem is suspicious.  The anatomy & physiology of hepatobiliary & pancreatic function was discussed.  The pathophysiology of gallbladder dysfunction was discussed.  Natural history risks without surgery was discussed.   I feel the risks of no intervention will lead to serious problems that outweigh the operative risks; therefore, I recommended cholecystectomy to remove the pathology.  I explained laparoscopic techniques with possible need for an open approach.  Probable cholangiogram to evaluate the bilary tract was explained as well.    Risks such as bleeding, infection, abscess, leak, injury to other organs, need for further treatment, heart attack, death, and other risks were discussed.  Possibility that this will not correct all abdominal symptoms was explained.  Goals of post-operative recovery were discussed as well.  We will work to minimize complications. I met her in the holding area and discussed this again with her. Questions were answered.  The patient expresses understanding & wishes to proceed with surgery.  Findings: The patient had a dilated gallbladder with some chronic gallbladder wall thickening.  Consistent with cholecystitis. Probable acute on chronic setting.  Cholangiogram looked otherwise normal. Some leaking out of the cystic ductotomy site only.  Procedure: Patient was brought in the operating room. She was positioned supine with arms tucked. She underwent general anesthesia without any difficulty. Her abdomen was prepped and draped in a sterile fashion. A surgical timeout confirmed our plan.  I made a incision through the superior umbilical in a curvilinear fashion. I was able to place a 5mm long port through the superior umbilical fascia using a modified Hassan cutdown technique. I began carbon dioxide insufflation. Camera inspection revealed no injury. There were no adhesions to the anterior abdominal wall.   I did see a dilated, mildly inflamed gallbladder in the right upper quadrant. I proceeded to continue with single site technique. I placed a #5 port in left upper aspect of the wound. I placed a 5 mm grasper in the right inferior aspect of the wound.  I turned attention to the right upper quadrant.  The gallbladder fundus was elevated cephalad. I freed the peritoneal coverings between the gallbladder and the liver on the posteriolateral and anteriomedial walls. I alternated between Harmonic & blunt Maryland dissection to help get a good critical view of the cystic artery and cystic duct. I mobilized the cystic artery; and, after getting a good 360 view, ligated the cystic artery using the Harmonic. I did further dissection to free the proximal half of the gallbladder off the liver bed to get a good critical view of the infundibulum and cystic duct. I skeletonized the cystic duct.  I placed a clip on the infundibulum. I did a partial partial cystic ductotomy. I placed a 5 [place] cholangiocatheter through a puncture site in the right subcostal ridge into the cystic duct. I did a cholangiogram with dilute radio-opaque contrast and  continuous fluoroscopy. Contrast flowed from a side  branch consistent with cystic duct cannulization. Contrast flowed up the common hepatic duct into the right and left intrahepatic chains out to secondary radicals. Contrast flowed down the common bile duct easily across the normal ampulla into the duodenum. There was some blush at the cystic duct consistent with a mild leak at the cystic duct cannulization site. I reviewed that numerous times and saw was coming from that and not from anywhere else.  I removed the cholangiocatheter. I placed clips on the cystic duct x4.  I completed cystic duct transection. I freed the gallbladder from its remaining attachments of liver bed. I ensured hemostasis on the gallbladder fossa of the liver and elsewhere. I inspected the rest of the abdomen & detected no injury nor bleeding elsewhere.  I removed the gallbladder out the supraumbilical fascia. I closed the fascia transversely using 0 Vicryl interrupted stitches. A closed the skin using 4-0 monocryl stitch.  Sterile dressing was applied. The patient was extubated & arrived in the PACU in stable condition..  I have discussed postoperative care with the patient in the holding area. I am about to discuss it with family now.   Findings: Cholecystitis with Cholelithiasis  Estimated Blood Loss: less than 50 mL         Drains: None         Specimens: Gallbladder           Complications: None; patient tolerated the procedure well.         Disposition: PACU - hemodynamically stable.         Condition: stable

## 2011-04-04 NOTE — Anesthesia Preprocedure Evaluation (Addendum)
Anesthesia Evaluation  Patient identified by MRN, date of birth, ID band Patient awake    Reviewed: Allergy & Precautions, H&P , NPO status , Patient's Chart, lab work & pertinent test results  Airway Mallampati: II TM Distance: >3 FB Neck ROM: Full    Dental  (+) Teeth Intact   Pulmonary shortness of breath and with exertion, asthma ,          Cardiovascular     Neuro/Psych    GI/Hepatic   Endo/Other    Renal/GU      Musculoskeletal   Abdominal   Peds  Hematology   Anesthesia Other Findings   Reproductive/Obstetrics                          Anesthesia Physical Anesthesia Plan  ASA: III  Anesthesia Plan: General   Post-op Pain Management:    Induction: Intravenous  Airway Management Planned: Oral ETT  Additional Equipment:   Intra-op Plan:   Post-operative Plan: Extubation in OR  Informed Consent: I have reviewed the patients History and Physical, chart, labs and discussed the procedure including the risks, benefits and alternatives for the proposed anesthesia with the patient or authorized representative who has indicated his/her understanding and acceptance.   Dental advisory given  Plan Discussed with: CRNA  Anesthesia Plan Comments:        Anesthesia Quick Evaluation

## 2011-04-04 NOTE — Transfer of Care (Signed)
Immediate Anesthesia Transfer of Care Note  Patient: Amy Lang  Procedure(s) Performed:  LAPAROSCOPIC CHOLECYSTECTOMY SINGLE PORT - IOC   Patient Location: PACU  Anesthesia Type: General  Level of Consciousness: awake, alert , oriented and patient cooperative  Airway & Oxygen Therapy: Patient Spontanous Breathing and Patient connected to nasal cannula oxygen  Post-op Assessment: Report given to PACU RN, Post -op Vital signs reviewed and stable and Patient moving all extremities  Post vital signs: Reviewed and stable  Complications: No apparent anesthesia complications

## 2011-04-04 NOTE — Preoperative (Addendum)
Beta Blockers   Reason not to administer Beta Blockers:Not Applicable 

## 2011-04-05 MED ORDER — ONDANSETRON HCL 4 MG PO TABS: 4.0000 mg | ORAL_TABLET | ORAL | Status: AC | PRN

## 2011-04-05 MED ORDER — OXYCODONE HCL 5 MG PO TABS: 5.0000 mg | ORAL_TABLET | ORAL | Status: AC | PRN

## 2011-04-05 NOTE — Progress Notes (Signed)
Patient discharged to home in care of spouse. Medications and instructions reviewed with patient with no questions. No change in assessment from this am. IV d/c'd with cath intact. Patient is to follow up with [facility] on 11.20.12.

## 2011-04-05 NOTE — Discharge Summary (Signed)
Patient ID: MARIANNE GOLIGHTLY MRN: [id number] DOB/AGE: 08-28-1965 45 y.o.  Admit date: 04/03/2011 Discharge date: 04/05/2011  Procedures: Single site laparoscopic cholecystectomy with IOC  Consults: none  Reason for Admission: This is a 45 year old female who presented to the emergency department with a complaint of epigastric and right upper quadrant abdominal pain. She had had nausea but no emesis. Upon arrival she had a ultrasound of the abdomen which revealed cholelithiasis with mild gallbladder wall thickening. This was consistent with possible early cholecystitis. Therefore we were asked to evaluate the patient for surgical admission. Please see admitting history and physical for further details.  Admission Diagnoses: 1. Early cholecystitis with cholelithiasis 2. Migraines  Hospital Course: The patient was admitted. She was placed on IV Cipro. She was allowed clear liquids. She was made n.p.o. after midnight. The following day she was taken to the operating room where she underwent a laparoscopic cholecystectomy. This was single site approach. There were no complications and the patient had a negative IOC.   The following day which was postoperative day 1, the patient was tolerating a regular diet as well as PO pain medicine. Her abdomen was soft and appropriately mildly tender. At this time the patient was felt stable for discharge home.  Discharge Diagnoses:  Principal Problem:  *Cholelithiasis with acute cholecystitis  2. Migraines  Discharge Medications: Current Discharge Medication List    START taking these medications   Details  ondansetron (ZOFRAN) 4 MG tablet Take 1 tablet (4 mg total) by mouth every 4 (four) hours as needed for nausea. Qty: 20 tablet, Refills: 0    oxyCODONE (OXY IR/ROXICODONE) 5 MG immediate release tablet Take 1-2 tablets (5-10 mg total) by mouth every 4 (four) hours as needed. Qty: 30 tablet, Refills: 0      CONTINUE these medications which have NOT  CHANGED   Details  aspirin EC 81 MG tablet Take 81 mg by mouth daily.      cetirizine (ZYRTEC) 10 MG tablet Take 10 mg by mouth daily.      cholecalciferol (VITAMIN D) 1000 UNITS tablet Take 1,000 Units by mouth daily.      eletriptan (RELPAX) 40 MG tablet One tablet by mouth as needed for migraine headache.  If the headache improves and then returns, dose may be repeated after 2 hours have elapsed since first dose (do not exceed 80 mg per day). For migraines...may repeat in 2 hours if necessary     Multiple Vitamins-Minerals (MULTIVITAMINS THER. W/MINERALS) TABS Take 1 tablet by mouth daily.      Thiamine HCl (VITAMIN B-1) 100 MG tablet Take 100 mg by mouth daily.      topiramate (TOPAMAX) 200 MG tablet TAKE 1 TABLET BY MOUTH AT BEDTIME Qty: 30 tablet, Refills: 3    venlafaxine (EFFEXOR-XR) 150 MG 24 hr capsule      vitamin C (ASCORBIC ACID) 500 MG tablet Take 500 mg by mouth daily.      promethazine (PHENERGAN) 25 MG tablet Take 25-50 mg by mouth every 6 (six) hours as needed. 1-2 po qid prn vomiting from migraine     SUMAtriptan (IMITREX) 25 MG tablet Take 25 mg by mouth every 2 (two) hours as needed. For migraine         Discharge Instructions: @DISCHARGEINSTRUCTIONS @ Follow-up Information    Follow up with [facility].   Contact information:   [phone]       1. Increase activity slowly, may walk up steps 2. Return to  work in 1 week as able 3. Resume regular diet, hold off on high fat or greasy foods 4. May shower, no bathing for 2 weeks 5. Call our office for fever greater than 101.5 or worsening pain 6. FOLLOW-UP APPOINTMENT: 04/18/11 at 2:10pm  Signed: OSBORNE,KELLY E 04/05/2011, 8:48 AM  Ardeth Sportsman, M.D., F.A.C.S. Gastrointestinal and Minimally Invasive Surgery [facility] [address] [place], [place] [address] [phone] Main / Paging [phone] Voice Mail

## 2011-04-05 NOTE — Progress Notes (Signed)
1 Day Post-Op  Subjective: Pt c/o soreness.  Tolerating a regular diet.  Tolerating PO pain meds.  No N/V  Objective: Vital signs in last 24 hours: Temp:  [97.9 F (36.6 C)-99.2 F (37.3 C)] 99.2 F (37.3 C) (11/07 0516) Pulse Rate:  [75-87] 87  (11/07 0516) Resp:  [10-27] 18  (11/07 0516) BP: (133-158)/(56-81) 148/63 mmHg (11/07 0516) SpO2:  [96 %-100 %] 100 % (11/07 0516) Last BM Date: 04/02/11  Intake/Output from previous day: 11/06 0701 - 11/07 0700 In: 3266 [I.V.:3266] Out: 1900 [Urine:1850; Blood:50] Intake/Output this shift:    PE: Abd: soft, appropriately tender in RUQ and periumbilical, ND, incision with some drainage. General: Pt awake/alert/oriented x4 in no major acute distress Eyes: PERRL, normal EOM. Neuro: CN II-XII intact w/o focal sensory/motor deficits. Lymph: No head/neck/groin lymphadenopathy Psych:  No delerium/psychosis/paranoia HEENT: Normocephalic, Mucus membranes moist.  No thrush Neck: Supple, No tracheal deviation Chest: No pain w good excursion CV:  Pulses intact.  Regular rhythm Ext:  SCDs BLE.  No mjr edema.  No cyanosis Skin: No petechiae / purpurae   Lab Results:   Basename 04/04/11 0640 04/03/11 0427 04/03/11 0420  WBC 6.9 -- 12.2*  HGB 13.2 14.3 --  HCT 39.4 42.0 --  PLT 197 -- 227   BMET  Basename 04/04/11 0640 04/03/11 0427  NA 139 140  K 4.0 4.3  CL 109 107  CO2 22 --  GLUCOSE 119* 158*  BUN 5* 7  CREATININE 0.66 0.80  CALCIUM 8.5 --   PT/INR No results found for this basename: LABPROT:2,INR:2 in the last 72 hours   Studies/Results: @RISRSLT2 @  Anti-infectives: Anti-infectives    None       Assessment/Plan  1.s/p lap chole, early acute cholecystitis with cholelithiasis  Plan: ok to d/c home  RTC in 2-3 weeks for f/u   LOS: 2 days    OSBORNE,KELLY E 04/05/2011  Ardeth Sportsman, M.D., F.A.C.S. Gastrointestinal and Minimally Invasive Surgery [facility] [address] [place], [place] [address] [phone] Main / Paging [phone] Voice Mail

## 2011-04-05 NOTE — Progress Notes (Signed)
Utilization review completed. Suits, Teri Diane11/11/2010  

## 2011-04-07 ENCOUNTER — Ambulatory Visit: Payer: BC Managed Care – PPO | Admitting: Internal Medicine

## 2011-04-18 ENCOUNTER — Encounter (INDEPENDENT_AMBULATORY_CARE_PROVIDER_SITE_OTHER): Payer: BC Managed Care – PPO

## 2011-04-25 ENCOUNTER — Encounter (INDEPENDENT_AMBULATORY_CARE_PROVIDER_SITE_OTHER): Payer: Self-pay | Admitting: Radiology

## 2011-04-25 ENCOUNTER — Ambulatory Visit (INDEPENDENT_AMBULATORY_CARE_PROVIDER_SITE_OTHER): Payer: BC Managed Care – PPO | Admitting: Radiology

## 2011-04-25 VITALS — BP 120/76 | HR 76 | Resp 16 | Ht 65.0 in | Wt 183.8 lb

## 2011-04-25 DIAGNOSIS — K819 Cholecystitis, unspecified: Secondary | ICD-10-CM

## 2011-04-25 NOTE — Progress Notes (Signed)
Amy Lang 1966/05/15 [id number] 04/25/2011   Amy Lang is a 45 y.o. female who had a laparoscopic cholecystectomy with intraoperative cholangiogram.  The pathology report confirmed cholecystitis.  The patient reports that they are feeling well with normal bowel movements and good appetite.  The pre-operative symptoms of abdominal pain, nausea, and vomiting have resolved.    Physical examination - Incisions appear well-healed with no sign of infection or bleeding.   Abdomen - soft, non-tender  Impression:  s/p laparoscopic cholecystectomy  Plan:  She may resume a regular diet and full activity.  She may follow-up on a PRN basis.

## 2011-05-11 ENCOUNTER — Ambulatory Visit (INDEPENDENT_AMBULATORY_CARE_PROVIDER_SITE_OTHER): Payer: BC Managed Care – PPO | Admitting: Surgery

## 2011-05-11 ENCOUNTER — Encounter (INDEPENDENT_AMBULATORY_CARE_PROVIDER_SITE_OTHER): Payer: Self-pay | Admitting: Surgery

## 2011-05-11 VITALS — BP 128/78 | HR 70 | Temp 99.2°F | Resp 16 | Ht 60.0 in | Wt 181.0 lb

## 2011-05-11 DIAGNOSIS — K8000 Calculus of gallbladder with acute cholecystitis without obstruction: Secondary | ICD-10-CM

## 2011-05-11 DIAGNOSIS — R1033 Periumbilical pain: Secondary | ICD-10-CM | POA: Insufficient documentation

## 2011-05-11 MED ORDER — OXYCODONE HCL 5 MG PO TABS: 5.0000 mg | ORAL_TABLET | Freq: Four times a day (QID) | ORAL | Status: AC | PRN

## 2011-05-11 NOTE — Progress Notes (Signed)
Subjective:     Patient ID: Amy Lang, female   DOB: 1965/08/26, 45 y.o.   MRN: [id number]  HPI  Amy Lang  March 26, 1966 [id number]  Patient Care Team: Sonda Primes, MD as PCP - General  This patient is a 45 y.o.female who presents today for surgical evaluation.   Reason for visit: Supraumbilical pain.  Patient is obese female that I did a laparoscopic cholecystectomy on 5 weeks ago. She seemed to recover from that well. 3 days ago, she noted episodes of severe pain just above her bellybutton. Worse with coughing and twisting. No nausea or vomiting. Regular bowel movements. No fevers or chills or sweats.  Patient Active Problem List  Diagnoses  . LIPOMAS, MULTIPLE  . OBESITY  . ANXIETY  . GRIEF REACTION  . DEPRESSION  . MIGRAINE HEADACHE  . SINUSITIS- ACUTE-NOS  . ALLERGIC RHINITIS  . ASTHMA  . DYSPNEA  . WHEEZING  . Abdominal pain, acute, supraumbilical    Past Medical History  Diagnosis Date  . Vaginal cancer   . Asthma   . Migraines   . PONV (postoperative nausea and vomiting)   . Heart murmur   . Shortness of breath   . Obesity   . Anxiety   . Depression   . Allergy     Past Surgical History  Procedure Date  . Knee surgery   . Abdominal hysterectomy     still has bilateral ovaries  . Cholecystectomy 04-04-11    single site    History   Social History  . Marital Status: Married    Spouse Name: N/A    Number of Children: N/A  . Years of Education: N/A   Occupational History  . Not on file.   Social History Main Topics  . Smoking status: Never Smoker   . Smokeless tobacco: Not on file   Comment: Regular Exercise - No  . Alcohol Use: No  . Drug Use: No  . Sexually Active:      hysterectomy   Other Topics Concern  . Not on file   Social History Narrative  . No narrative on file    Family History  Problem Relation Age of Onset  . Heart disease Mother   . COPD Mother   . Cancer Father     Lung  . Heart disease Maternal  Grandmother   . Heart disease Maternal Grandfather   . Heart disease Paternal Grandmother   . Heart disease Paternal Grandfather     Current outpatient prescriptions:aspirin EC 81 MG tablet, Take 81 mg by mouth daily.  , Disp: , Rfl: ;  cetirizine (ZYRTEC) 10 MG tablet, Take 10 mg by mouth daily.  , Disp: , Rfl: ;  cholecalciferol (VITAMIN D) 1000 UNITS tablet, Take 1,000 Units by mouth daily.  , Disp: , Rfl:  eletriptan (RELPAX) 40 MG tablet, One tablet by mouth as needed for migraine headache.  If the headache improves and then returns, dose may be repeated after 2 hours have elapsed since first dose (do not exceed 80 mg per day). For migraines...may repeat in 2 hours if necessary , Disp: , Rfl: ;  Multiple Vitamins-Minerals (MULTIVITAMINS THER. W/MINERALS) TABS, Take 1 tablet by mouth daily.  , Disp: , Rfl:  promethazine (PHENERGAN) 25 MG tablet, Take 25-50 mg by mouth every 6 (six) hours as needed. 1-2 po qid prn vomiting from migraine , Disp: , Rfl: ;  SUMAtriptan (IMITREX) 25 MG tablet, Take 25 mg by mouth every 2 (two)  hours as needed. For migraine , Disp: , Rfl: ;  Thiamine HCl (VITAMIN B-1) 100 MG tablet, Take 100 mg by mouth daily.  , Disp: , Rfl: ;  topiramate (TOPAMAX) 200 MG tablet, TAKE 1 TABLET BY MOUTH AT BEDTIME, Disp: 30 tablet, Rfl: 3 venlafaxine (EFFEXOR-XR) 150 MG 24 hr capsule,  , Disp: , Rfl: ;  vitamin C (ASCORBIC ACID) 500 MG tablet, Take 500 mg by mouth daily.  , Disp: , Rfl: ;  oxyCODONE (OXY IR/ROXICODONE) 5 MG immediate release tablet, Take 1-2 tablets (5-10 mg total) by mouth every 6 (six) hours as needed for pain., Disp: 30 tablet, Rfl: 0  Allergies  Allergen Reactions  . Butorphanol Tartrate     REACTION: hallucinations  . Codeine Sulfate   . Darvocet (Propoxyphene N-Acetaminophen)   . Penicillins   . Sulfadiazine     BP 128/78  Pulse 70  Temp(Src) 99.2 F (37.3 C) (Temporal)  Resp 16  Ht 5' (1.524 m)  Wt 181 lb (82.101 kg)  BMI 35.35 kg/m2     Review  of Systems  Constitutional: Negative for fever, chills and diaphoresis.  HENT: Negative for ear pain, sore throat and trouble swallowing.   Eyes: Negative for photophobia and visual disturbance.  Respiratory: Negative for cough and choking.   Cardiovascular: Negative for chest pain, palpitations and leg swelling.  Gastrointestinal: Positive for abdominal pain. Negative for nausea, vomiting, diarrhea, constipation, blood in stool, abdominal distention, anal bleeding and rectal pain.  Genitourinary: Negative for dysuria, frequency and difficulty urinating.  Musculoskeletal: Negative for myalgias, back pain, arthralgias and gait problem.  Skin: Negative for color change, pallor and rash.  Neurological: Negative for dizziness, speech difficulty, weakness and numbness.  Hematological: Negative for adenopathy.  Psychiatric/Behavioral: Negative for confusion and agitation. The patient is not nervous/anxious.        Objective:   Physical Exam  Constitutional: She is oriented to person, place, and time. She appears well-developed and well-nourished. No distress.  HENT:  Head: Normocephalic.  Mouth/Throat: Oropharynx is clear and moist. No oropharyngeal exudate.  Eyes: Conjunctivae and EOM are normal. Pupils are equal, round, and reactive to light. No scleral icterus.  Neck: Normal range of motion. No tracheal deviation present.  Cardiovascular: Normal rate and intact distal pulses.   Pulmonary/Chest: Effort normal. No respiratory distress. She exhibits no tenderness.  Abdominal: Soft. She exhibits no distension and no mass. There is no rebound and no guarding. Hernia confirmed negative in the right inguinal area and confirmed negative in the left inguinal area.       Incisions clean with normal healing ridges.  No hernias.  Sore 10x10cm region above umbilicus  Genitourinary: No vaginal discharge found.  Musculoskeletal: Normal range of motion. She exhibits no tenderness.  Lymphadenopathy:        Right: No inguinal adenopathy present.       Left: No inguinal adenopathy present.  Neurological: She is alert and oriented to person, place, and time. No cranial nerve deficit. She exhibits normal muscle tone. Coordination normal.  Skin: Skin is warm and dry. No rash noted. She is not diaphoretic.  Psychiatric: She has a normal mood and affect. Her behavior is normal.       Assessment:     Supraumb soreness, no hernia.  Prob muscle strain    Plan:     Low Impact activity as tolerated.  Do not push through pain.  NSAIDS, Heat/Ice.  Oxycodone for breakthrough pain.  Advanced on diet as tolerated. Bowel  regimen to avoid problems.  Return to clinic p.r.n or if worse or definite mass felt. CT if not better or worsens to r/o subtle hernia.  The patient expressed understanding and appreciation

## 2011-05-11 NOTE — Patient Instructions (Addendum)
Muscle Strain A muscle strain, or pulled muscle, occurs when a muscle is over-stretched. A small number of muscle fibers may also be torn. This is especially common in athletes. This happens when a sudden violent force placed on a muscle pushes it past its capacity. Usually, recovery from a pulled muscle takes 1 to 2 weeks. But complete healing will take 5 to 6 weeks. There are millions of muscle fibers. Following injury, your body will usually return to normal quickly. HOME CARE INSTRUCTIONS   While awake, apply ice to the sore muscle for 15 to 20 minutes each hour for the first 2 days. Put ice in a plastic bag and place a towel between the bag of ice and your skin.   Do not use the pulled muscle for several days. Do not use the muscle if you have pain.   You may wrap the injured area with an elastic bandage for comfort. Be careful not to bind it too tightly. This may interfere with blood circulation.   Only take over-the-counter or prescription medicines for pain, discomfort, or fever as directed by your caregiver. Do not use aspirin as this will increase bleeding (bruising) at injury site.   Warming up before exercise helps prevent muscle strains.  SEEK MEDICAL CARE IF:  There is increased pain or swelling in the affected area. MAKE SURE YOU:   Understand these instructions.   Will watch your condition.   Will get help right away if you are not doing well or get worse.  Document Released: 05/15/2005 Document Revised: 01/25/2011 Document Reviewed: 12/12/2006 [employer] Patient Information 2012 [employer]   Managing Pain  Pain after surgery or related to activity is often due to strain/injury to muscle, tendon, nerves and/or incisions.  This pain is usually short-term and will improve in a few months.   Many people find it helpful to do the following things TOGETHER to help speed the process of healing and to get back to regular activity more quickly:  1. Avoid heavy physical  activity a.  no lifting greater than 20 pounds b. Do not "push through" the pain.  Listen to your body and avoid positions and maneuvers than reproduce the pain c. Walking is okay as tolerated, but go slowly and stop when getting sore.  d. Remember: If it hurts to do it, then don't do it! 2. Take Anti-inflammatory medication  a. Take with food/snack around the clock for 1-2 weeks i. This helps the muscle and nerve tissues become less irritable and calm down faster b. Choose ONE of the following over-the-counter medications: i. Naproxen 220mg tabs (ex. Aleve) 1-2 pills twice a day  ii. Ibuprofen 200mg tabs (ex. Advil, Motrin) 3-4 pills with every meal and just before bedtime iii. Acetaminophen 500mg tabs (Tylenol) 1-2 pills with every meal and just before bedtime 3. Use a Heating pad or Ice/Cold Pack a. 4-6 times a day b. May use warm bath/hottub  or showers 4. Try Gentle Massage and/or Stretching  a. at the area of pain many times a day b. stop if you feel pain - do not overdo it  Try these steps together to help you body heal faster and avoid making things get worse.  Doing just one of these things may not be enough.    If you are not getting better after two weeks or are noticing you are getting worse, contact our office for further advice; we may need to re-evaluate you & see what other things we can do   to help.   

## 2011-05-18 ENCOUNTER — Telehealth: Payer: Self-pay | Admitting: *Deleted

## 2011-06-07 ENCOUNTER — Other Ambulatory Visit: Payer: Self-pay | Admitting: Dermatology

## 2011-06-28 ENCOUNTER — Other Ambulatory Visit: Payer: Self-pay | Admitting: *Deleted

## 2011-06-28 MED ORDER — TOPIRAMATE 200 MG PO TABS: 200.0000 mg | ORAL_TABLET | Freq: Every day | ORAL | Status: DC

## 2011-06-28 MED ORDER — VENLAFAXINE HCL ER 150 MG PO CP24: 150.0000 mg | ORAL_CAPSULE | Freq: Two times a day (BID) | ORAL | Status: DC

## 2011-07-06 ENCOUNTER — Telehealth: Payer: Self-pay

## 2011-07-06 MED ORDER — DOXYCYCLINE HYCLATE 100 MG PO TABS: 100.0000 mg | ORAL_TABLET | Freq: Two times a day (BID) | ORAL | Status: AC

## 2011-07-06 NOTE — Telephone Encounter (Signed)
D/c levaquin Doxy x 10 d emailed OV if sick

## 2011-07-06 NOTE — Telephone Encounter (Signed)
Pt advised via work VM of Rx/pharmacy per her request

## 2011-07-06 NOTE — Telephone Encounter (Signed)
Pt called stating she was seen at [facility] for bilateral ear infection and sinus infection and Rx'd Levaquin. Pt says that ABX is causing GI upset including upper GI pain, worse at night. Pt is requesting an alternative ABX, please advise.

## 2011-07-10 ENCOUNTER — Ambulatory Visit: Payer: BC Managed Care – PPO | Admitting: Internal Medicine

## 2011-07-10 DIAGNOSIS — Z0289 Encounter for other administrative examinations: Secondary | ICD-10-CM

## 2011-07-11 NOTE — Telephone Encounter (Signed)
x

## 2011-09-06 ENCOUNTER — Other Ambulatory Visit: Payer: Self-pay | Admitting: *Deleted

## 2011-09-06 MED ORDER — VENLAFAXINE HCL ER 150 MG PO CP24: 150.0000 mg | ORAL_CAPSULE | Freq: Two times a day (BID) | ORAL | Status: DC

## 2011-09-06 MED ORDER — TOPIRAMATE 200 MG PO TABS: 200.0000 mg | ORAL_TABLET | Freq: Every day | ORAL | Status: DC

## 2011-09-18 ENCOUNTER — Other Ambulatory Visit: Payer: Self-pay | Admitting: Internal Medicine

## 2011-09-27 ENCOUNTER — Other Ambulatory Visit: Payer: Self-pay | Admitting: Internal Medicine

## 2011-10-10 ENCOUNTER — Other Ambulatory Visit: Payer: Self-pay | Admitting: Obstetrics & Gynecology

## 2011-10-10 DIAGNOSIS — N63 Unspecified lump in unspecified breast: Secondary | ICD-10-CM

## 2011-10-13 ENCOUNTER — Other Ambulatory Visit: Payer: BC Managed Care – PPO

## 2011-10-20 ENCOUNTER — Other Ambulatory Visit: Payer: Self-pay | Admitting: Obstetrics & Gynecology

## 2011-10-20 ENCOUNTER — Ambulatory Visit
Admission: RE | Admit: 2011-10-20 | Discharge: 2011-10-20 | Disposition: A | Discharge status: Home / Self Care | Payer: BC Managed Care – PPO | Source: Ambulatory Visit | Attending: Obstetrics & Gynecology | Admitting: Obstetrics & Gynecology

## 2011-10-20 DIAGNOSIS — N63 Unspecified lump in unspecified breast: Secondary | ICD-10-CM

## 2011-10-25 ENCOUNTER — Other Ambulatory Visit: Payer: Self-pay | Admitting: Internal Medicine

## 2011-10-30 ENCOUNTER — Ambulatory Visit
Admission: RE | Admit: 2011-10-30 | Discharge: 2011-10-30 | Disposition: A | Discharge status: Home / Self Care | Payer: BC Managed Care – PPO | Source: Ambulatory Visit | Attending: Obstetrics & Gynecology | Admitting: Obstetrics & Gynecology

## 2011-10-30 ENCOUNTER — Other Ambulatory Visit: Payer: Self-pay | Admitting: Obstetrics & Gynecology

## 2011-10-30 DIAGNOSIS — N63 Unspecified lump in unspecified breast: Secondary | ICD-10-CM

## 2011-12-08 ENCOUNTER — Other Ambulatory Visit: Payer: Self-pay | Admitting: Dermatology

## 2012-01-31 ENCOUNTER — Other Ambulatory Visit: Payer: Self-pay | Admitting: Obstetrics & Gynecology

## 2012-01-31 DIAGNOSIS — N63 Unspecified lump in unspecified breast: Secondary | ICD-10-CM

## 2012-02-01 ENCOUNTER — Ambulatory Visit
Admission: RE | Admit: 2012-02-01 | Discharge: 2012-02-01 | Disposition: A | Discharge status: Home / Self Care | Payer: BC Managed Care – PPO | Source: Ambulatory Visit | Attending: Obstetrics & Gynecology | Admitting: Obstetrics & Gynecology

## 2012-02-01 DIAGNOSIS — N63 Unspecified lump in unspecified breast: Secondary | ICD-10-CM

## 2012-02-05 ENCOUNTER — Other Ambulatory Visit: Payer: Self-pay | Admitting: Internal Medicine

## 2012-02-06 ENCOUNTER — Telehealth: Payer: Self-pay | Admitting: *Deleted

## 2012-02-06 MED ORDER — ELETRIPTAN HYDROBROMIDE 40 MG PO TABS: 40.0000 mg | ORAL_TABLET | ORAL | Status: DC | PRN

## 2012-02-06 NOTE — Telephone Encounter (Signed)
Left msg on triage stating she has made appt to see md on 04/15/12 @ 9:00. Requesting nurse to call in refill since appt has been made...Raechel Chute

## 2012-04-07 ENCOUNTER — Other Ambulatory Visit: Payer: Self-pay | Admitting: Internal Medicine

## 2012-04-09 ENCOUNTER — Telehealth: Payer: Self-pay | Admitting: Internal Medicine

## 2012-04-09 MED ORDER — TOPIRAMATE 200 MG PO TABS: 200.0000 mg | ORAL_TABLET | Freq: Every day | ORAL | Status: DC

## 2012-04-09 NOTE — Telephone Encounter (Signed)
Pt called because the pharmacy told her she needs an appt.  She has one on Monday Nov. 18.  She has been out of medicine for 2 days.

## 2012-04-09 NOTE — Telephone Encounter (Signed)
10 day supply sent  

## 2012-04-11 ENCOUNTER — Ambulatory Visit: Payer: BC Managed Care – PPO | Admitting: Internal Medicine

## 2012-04-15 ENCOUNTER — Telehealth: Payer: Self-pay | Admitting: Internal Medicine

## 2012-04-15 ENCOUNTER — Ambulatory Visit (INDEPENDENT_AMBULATORY_CARE_PROVIDER_SITE_OTHER): Payer: BC Managed Care – PPO | Admitting: Internal Medicine

## 2012-04-15 ENCOUNTER — Encounter: Payer: Self-pay | Admitting: Internal Medicine

## 2012-04-15 ENCOUNTER — Other Ambulatory Visit (INDEPENDENT_AMBULATORY_CARE_PROVIDER_SITE_OTHER): Payer: BC Managed Care – PPO

## 2012-04-15 VITALS — BP 140/88 | HR 80 | Temp 98.1°F | Resp 16 | Wt 188.0 lb

## 2012-04-15 DIAGNOSIS — E538 Deficiency of other specified B group vitamins: Secondary | ICD-10-CM

## 2012-04-15 DIAGNOSIS — F329 Major depressive disorder, single episode, unspecified: Secondary | ICD-10-CM

## 2012-04-15 DIAGNOSIS — Z0000 Encounter for general adult medical examination without abnormal findings: Secondary | ICD-10-CM

## 2012-04-15 DIAGNOSIS — J309 Allergic rhinitis, unspecified: Secondary | ICD-10-CM

## 2012-04-15 DIAGNOSIS — G43909 Migraine, unspecified, not intractable, without status migrainosus: Secondary | ICD-10-CM

## 2012-04-15 DIAGNOSIS — Z23 Encounter for immunization: Secondary | ICD-10-CM

## 2012-04-15 DIAGNOSIS — E669 Obesity, unspecified: Secondary | ICD-10-CM

## 2012-04-15 DIAGNOSIS — F411 Generalized anxiety disorder: Secondary | ICD-10-CM

## 2012-04-15 DIAGNOSIS — N951 Menopausal and female climacteric states: Secondary | ICD-10-CM

## 2012-04-15 DIAGNOSIS — R232 Flushing: Secondary | ICD-10-CM

## 2012-04-15 LAB — CBC WITH DIFFERENTIAL/PLATELET
Basophils Absolute: 0 10*3/uL (ref 0.0–0.1)
Eosinophils Relative: 1.4 % (ref 0.0–5.0)
HCT: 41.9 % (ref 36.0–46.0)
Lymphs Abs: 1.9 10*3/uL (ref 0.7–4.0)
MCV: 90.8 fl (ref 78.0–100.0)
Monocytes Absolute: 0.4 10*3/uL (ref 0.1–1.0)
Platelets: 225 10*3/uL (ref 150.0–400.0)
RDW: 12.8 % (ref 11.5–14.6)

## 2012-04-15 LAB — BASIC METABOLIC PANEL
BUN: 16 mg/dL (ref 6–23)
Chloride: 110 mEq/L (ref 96–112)
Glucose, Bld: 107 mg/dL — ABNORMAL HIGH (ref 70–99)
Potassium: 4.3 mEq/L (ref 3.5–5.1)

## 2012-04-15 LAB — LIPID PANEL
Cholesterol: 224 mg/dL — ABNORMAL HIGH (ref 0–200)
Total CHOL/HDL Ratio: 5
Triglycerides: 394 mg/dL — ABNORMAL HIGH (ref 0.0–149.0)
VLDL: 78.8 mg/dL — ABNORMAL HIGH (ref 0.0–40.0)

## 2012-04-15 LAB — URINALYSIS
Leukocytes, UA: NEGATIVE
Specific Gravity, Urine: >=1.03 (ref 1.000–1.030)
Urobilinogen, UA: 0.2 (ref 0.0–1.0)

## 2012-04-15 LAB — LDL CHOLESTEROL, DIRECT: Direct LDL: 112.9 mg/dL

## 2012-04-15 LAB — VITAMIN B12: Vitamin B-12: 182 pg/mL — ABNORMAL LOW (ref 211–911)

## 2012-04-15 LAB — HEPATIC FUNCTION PANEL: Total Bilirubin: 0.5 mg/dL (ref 0.3–1.2)

## 2012-04-15 LAB — TSH: TSH: 1.02 u[IU]/mL (ref 0.35–5.50)

## 2012-04-15 MED ORDER — ELETRIPTAN HYDROBROMIDE 40 MG PO TABS: 40.0000 mg | ORAL_TABLET | ORAL | Status: DC | PRN

## 2012-04-15 MED ORDER — VENLAFAXINE HCL ER 150 MG PO CP24: 150.0000 mg | ORAL_CAPSULE | Freq: Two times a day (BID) | ORAL | Status: DC

## 2012-04-15 MED ORDER — TOPIRAMATE 200 MG PO TABS: 200.0000 mg | ORAL_TABLET | Freq: Every day | ORAL | Status: DC

## 2012-04-15 MED ORDER — CILIDINIUM-CHLORDIAZEPOXIDE 2.5-5 MG PO CAPS: 1.0000 | ORAL_CAPSULE | Freq: Three times a day (TID) | ORAL | Status: DC | PRN

## 2012-04-15 MED ORDER — PROMETHAZINE HCL 25 MG PO TABS: 25.0000 mg | ORAL_TABLET | Freq: Four times a day (QID) | ORAL | Status: DC | PRN

## 2012-04-15 MED ORDER — VITAMIN B-12 1000 MCG SL SUBL: 1.0000 | SUBLINGUAL_TABLET | Freq: Every day | SUBLINGUAL | Status: DC

## 2012-04-15 NOTE — Progress Notes (Signed)
  Subjective:    Patient ID: Amy Lang, female    DOB: 11-Nov-1965, 46 y.o.   MRN: [id number]  HPI  The patient is here for a wellness exam. F/u dyslipidemia (off statins per Dr Alanda Amass), migraines The patient presents for a follow-up of  Chronic migraines, allergies, depression with medicines. C/o hot flashes, wt gain, fatigue.  Wt Readings from Last 3 Encounters:  04/15/12 188 lb (85.276 kg)  05/11/11 181 lb (82.101 kg)  04/25/11 183 lb 12.8 oz (83.371 kg)   BP Readings from Last 3 Encounters:  04/15/12 140/88  05/11/11 128/78  04/25/11 120/76       Review of Systems  Constitutional: Negative for chills, activity change, appetite change, fatigue and unexpected weight change.  HENT: Negative for congestion, mouth sores and sinus pressure.   Eyes: Negative for visual disturbance.  Respiratory: Negative for cough and chest tightness.   Gastrointestinal: Negative for nausea and abdominal pain.  Genitourinary: Negative for frequency, difficulty urinating and vaginal pain.  Musculoskeletal: Negative for back pain and gait problem.  Skin: Negative for pallor and rash.  Neurological: Negative for dizziness, tremors, weakness, numbness and headaches.  Psychiatric/Behavioral: Negative for confusion and sleep disturbance.       Objective:   Physical Exam  Constitutional: She appears well-developed. No distress.  HENT:  Head: Normocephalic.  Right Ear: External ear normal.  Left Ear: External ear normal.  Nose: Nose normal.  Mouth/Throat: Oropharynx is clear and moist.  Eyes: Conjunctivae normal are normal. Pupils are equal, round, and reactive to light. Right eye exhibits no discharge. Left eye exhibits no discharge.  Neck: Normal range of motion. Neck supple. No JVD present. No tracheal deviation present. No thyromegaly present.  Cardiovascular: Normal rate, regular rhythm and normal heart sounds.   Pulmonary/Chest: No stridor. No respiratory distress. She has no  wheezes.  Abdominal: Soft. Bowel sounds are normal. She exhibits no distension and no mass. There is no tenderness. There is no rebound and no guarding.  Musculoskeletal: She exhibits no edema and no tenderness.  Lymphadenopathy:    She has no cervical adenopathy.  Neurological: She displays normal reflexes. No cranial nerve deficit. She exhibits normal muscle tone. Coordination normal.  Skin: No rash noted. No erythema.  Psychiatric: She has a normal mood and affect. Her behavior is normal. Judgment and thought content normal.     Lab Results  Component Value Date   WBC 6.9 04/04/2011   HGB 13.2 04/04/2011   HCT 39.4 04/04/2011   PLT 197 04/04/2011   GLUCOSE 119* 04/04/2011   CHOL 184 07/09/2009   TRIG 168.0* 07/09/2009   HDL 43.20 07/09/2009   LDLCALC 107* 07/09/2009   ALT 21 04/04/2011   AST 16 04/04/2011   NA 139 04/04/2011   K 4.0 04/04/2011   CL 109 04/04/2011   CREATININE 0.66 04/04/2011   BUN 5* 04/04/2011   CO2 22 04/04/2011   TSH 1.18 07/09/2009        Assessment & Plan:

## 2012-04-15 NOTE — Assessment & Plan Note (Signed)
Start Rx 

## 2012-04-15 NOTE — Assessment & Plan Note (Signed)
Diet discussed 

## 2012-04-15 NOTE — Assessment & Plan Note (Signed)
We discussed age appropriate health related issues, including available/recomended screening tests and vaccinations. We discussed a need for adhering to healthy diet and exercise. Labs/EKG were reviewed/ordered. All questions were answered.   

## 2012-04-15 NOTE — Assessment & Plan Note (Signed)
Continue with current prescription therapy as reflected on the Med list.  

## 2012-04-15 NOTE — Assessment & Plan Note (Signed)
Chronic  Continue with current prescription therapy as reflected on the Med list.  

## 2012-04-15 NOTE — Assessment & Plan Note (Signed)
Will start Rx  B12 in 3 mo

## 2012-04-15 NOTE — Telephone Encounter (Signed)
Amy Lang, please, inform patient that all labs are normal except for a low B12 and high TG, elevated LFTs. Come for a B12 shot 1000 mcg. Pls start Vit B12 sl daily, eat well. Labs in 3 mo Thx

## 2012-04-15 NOTE — Assessment & Plan Note (Signed)
Will try librax

## 2012-04-16 LAB — VITAMIN D 25 HYDROXY (VIT D DEFICIENCY, FRACTURES): Vit D, 25-Hydroxy: 40 ng/mL (ref 30–89)

## 2012-04-16 NOTE — Telephone Encounter (Signed)
Left mess for patient to call back.  

## 2012-04-17 NOTE — Telephone Encounter (Signed)
Pt advised and scheduled for B12 inj

## 2012-04-19 ENCOUNTER — Telehealth: Payer: Self-pay | Admitting: Internal Medicine

## 2012-04-19 ENCOUNTER — Ambulatory Visit (INDEPENDENT_AMBULATORY_CARE_PROVIDER_SITE_OTHER): Payer: BC Managed Care – PPO | Admitting: Internal Medicine

## 2012-04-19 DIAGNOSIS — E538 Deficiency of other specified B group vitamins: Secondary | ICD-10-CM

## 2012-04-19 MED ORDER — CYANOCOBALAMIN 1000 MCG/ML IJ SOLN
1000.0000 ug | Freq: Once | INTRAMUSCULAR | Status: AC
  Administered 2012-04-19: 1000 ug via INTRAMUSCULAR

## 2012-04-19 NOTE — Telephone Encounter (Signed)
Pt came today for a  B-12 inj.  It was her first one.  She needs to know how often to schedule these.  Will she need them weekly for a while or in a month?   OK to lmom at home number.

## 2012-04-19 NOTE — Telephone Encounter (Signed)
Was done on 04/15/12. See med list.

## 2012-04-19 NOTE — Telephone Encounter (Signed)
Amy Lang needs to take sublingual Vit B12 qd and have her vit B12 rechecked in 2 mo Thx

## 2012-04-19 NOTE — Telephone Encounter (Signed)
Pt needs to have the B-12 pill called in.

## 2012-04-19 NOTE — Telephone Encounter (Signed)
LMOM with the instructions.

## 2012-05-12 ENCOUNTER — Inpatient Hospital Stay (HOSPITAL_COMMUNITY): Payer: BC Managed Care – PPO

## 2012-05-12 ENCOUNTER — Ambulatory Visit: Admit: 2012-05-12 | Payer: Self-pay | Admitting: Obstetrics and Gynecology

## 2012-05-12 ENCOUNTER — Emergency Department (HOSPITAL_COMMUNITY): Payer: BC Managed Care – PPO

## 2012-05-12 ENCOUNTER — Encounter (HOSPITAL_COMMUNITY): Payer: Self-pay | Admitting: *Deleted

## 2012-05-12 ENCOUNTER — Encounter (HOSPITAL_COMMUNITY): Payer: Self-pay

## 2012-05-12 ENCOUNTER — Encounter (HOSPITAL_COMMUNITY)
Admission: AD | Disposition: A | Discharge status: Home / Self Care | Payer: Self-pay | Source: Ambulatory Visit | Attending: Obstetrics and Gynecology

## 2012-05-12 ENCOUNTER — Emergency Department (HOSPITAL_COMMUNITY)
Admission: EM | Admit: 2012-05-12 | Discharge: 2012-05-12 | Disposition: A | Discharge status: Home / Self Care | Payer: BC Managed Care – PPO | Source: Home / Self Care | Attending: Emergency Medicine | Admitting: Emergency Medicine

## 2012-05-12 ENCOUNTER — Inpatient Hospital Stay (HOSPITAL_COMMUNITY)
Admission: AD | Admit: 2012-05-12 | Discharge: 2012-05-13 | DRG: 359 | Disposition: A | Discharge status: Home / Self Care | Payer: BC Managed Care – PPO | Source: Ambulatory Visit | Attending: Obstetrics and Gynecology | Admitting: Obstetrics and Gynecology

## 2012-05-12 DIAGNOSIS — F329 Major depressive disorder, single episode, unspecified: Secondary | ICD-10-CM | POA: Insufficient documentation

## 2012-05-12 DIAGNOSIS — R011 Cardiac murmur, unspecified: Secondary | ICD-10-CM | POA: Insufficient documentation

## 2012-05-12 DIAGNOSIS — Z79899 Other long term (current) drug therapy: Secondary | ICD-10-CM | POA: Insufficient documentation

## 2012-05-12 DIAGNOSIS — E669 Obesity, unspecified: Secondary | ICD-10-CM | POA: Insufficient documentation

## 2012-05-12 DIAGNOSIS — F3289 Other specified depressive episodes: Secondary | ICD-10-CM | POA: Insufficient documentation

## 2012-05-12 DIAGNOSIS — C52 Malignant neoplasm of vagina: Secondary | ICD-10-CM | POA: Insufficient documentation

## 2012-05-12 DIAGNOSIS — Z8679 Personal history of other diseases of the circulatory system: Secondary | ICD-10-CM | POA: Insufficient documentation

## 2012-05-12 DIAGNOSIS — N838 Other noninflammatory disorders of ovary, fallopian tube and broad ligament: Secondary | ICD-10-CM

## 2012-05-12 DIAGNOSIS — R110 Nausea: Secondary | ICD-10-CM | POA: Insufficient documentation

## 2012-05-12 DIAGNOSIS — M545 Low back pain, unspecified: Secondary | ICD-10-CM | POA: Insufficient documentation

## 2012-05-12 DIAGNOSIS — Z7982 Long term (current) use of aspirin: Secondary | ICD-10-CM | POA: Insufficient documentation

## 2012-05-12 DIAGNOSIS — R1032 Left lower quadrant pain: Secondary | ICD-10-CM | POA: Diagnosis present

## 2012-05-12 DIAGNOSIS — Z9889 Other specified postprocedural states: Secondary | ICD-10-CM | POA: Insufficient documentation

## 2012-05-12 DIAGNOSIS — N8353 Torsion of ovary, ovarian pedicle and fallopian tube: Principal | ICD-10-CM | POA: Diagnosis present

## 2012-05-12 DIAGNOSIS — F411 Generalized anxiety disorder: Secondary | ICD-10-CM | POA: Insufficient documentation

## 2012-05-12 DIAGNOSIS — R197 Diarrhea, unspecified: Secondary | ICD-10-CM | POA: Insufficient documentation

## 2012-05-12 DIAGNOSIS — J45909 Unspecified asthma, uncomplicated: Secondary | ICD-10-CM | POA: Insufficient documentation

## 2012-05-12 DIAGNOSIS — N83519 Torsion of ovary and ovarian pedicle, unspecified side: Secondary | ICD-10-CM

## 2012-05-12 HISTORY — PX: LAPAROTOMY: SHX154

## 2012-05-12 LAB — CBC WITH DIFFERENTIAL/PLATELET
Basophils Relative: 0 % (ref 0–1)
HCT: 39 % (ref 36.0–46.0)
Hemoglobin: 13.3 g/dL (ref 12.0–15.0)
Lymphocytes Relative: 23 % (ref 12–46)
Lymphs Abs: 1.8 10*3/uL (ref 0.7–4.0)
MCHC: 34.1 g/dL (ref 30.0–36.0)
Monocytes Absolute: 0.5 10*3/uL (ref 0.1–1.0)
Monocytes Relative: 6 % (ref 3–12)
Neutro Abs: 5.3 10*3/uL (ref 1.7–7.7)
Neutrophils Relative %: 68 % (ref 43–77)
RBC: 4.42 MIL/uL (ref 3.87–5.11)
WBC: 7.8 10*3/uL (ref 4.0–10.5)

## 2012-05-12 LAB — URINALYSIS, ROUTINE W REFLEX MICROSCOPIC
Bilirubin Urine: NEGATIVE
Hgb urine dipstick: NEGATIVE
Ketones, ur: NEGATIVE mg/dL
Nitrite: NEGATIVE
pH: 7 (ref 5.0–8.0)

## 2012-05-12 LAB — BASIC METABOLIC PANEL
BUN: 10 mg/dL (ref 6–23)
Chloride: 101 mEq/L (ref 96–112)
GFR calc Af Amer: >90 mL/min (ref >90)
GFR calc non Af Amer: >90 mL/min (ref >90)
Potassium: 3.3 mEq/L — ABNORMAL LOW (ref 3.5–5.1)
Sodium: 134 mEq/L — ABNORMAL LOW (ref 135–145)

## 2012-05-12 SURGERY — LAPAROTOMY
Anesthesia: General | Site: Abdomen | Wound class: Clean Contaminated

## 2012-05-12 MED ORDER — FENTANYL CITRATE 0.05 MG/ML IJ SOLN
INTRAMUSCULAR | Status: DC | PRN
  Administered 2012-05-12: 50 ug via INTRAVENOUS
  Administered 2012-05-12: 150 ug via INTRAVENOUS
  Administered 2012-05-12: 50 ug via INTRAVENOUS
  Administered 2012-05-12 (×2): 25 ug via INTRAVENOUS
  Administered 2012-05-12: 50 ug via INTRAVENOUS

## 2012-05-12 MED ORDER — DEXAMETHASONE SODIUM PHOSPHATE 4 MG/ML IJ SOLN
INTRAMUSCULAR | Status: DC | PRN
  Administered 2012-05-12: 10 mg via INTRAVENOUS

## 2012-05-12 MED ORDER — OXYCODONE-ACETAMINOPHEN 5-325 MG PO TABS: 1.0000 | ORAL_TABLET | Freq: Four times a day (QID) | ORAL | Status: DC | PRN

## 2012-05-12 MED ORDER — KETOROLAC TROMETHAMINE 60 MG/2ML IM SOLN
60.0000 mg | Freq: Once | INTRAMUSCULAR | Status: AC
  Administered 2012-05-12: 60 mg via INTRAMUSCULAR
  Filled 2012-05-12: qty 2

## 2012-05-12 MED ORDER — FENTANYL CITRATE 0.05 MG/ML IJ SOLN
100.0000 ug | Freq: Once | INTRAMUSCULAR | Status: AC
  Administered 2012-05-12: 100 ug via INTRAVENOUS
  Filled 2012-05-12: qty 2

## 2012-05-12 MED ORDER — SODIUM CHLORIDE 0.9 % IR SOLN
Status: DC | PRN
  Administered 2012-05-12: 1000 mL

## 2012-05-12 MED ORDER — KETOROLAC TROMETHAMINE 30 MG/ML IJ SOLN
INTRAMUSCULAR | Status: DC | PRN
  Administered 2012-05-12: 30 mg via INTRAVENOUS

## 2012-05-12 MED ORDER — NALOXONE HCL 0.4 MG/ML IJ SOLN: 0.4000 mg | INTRAMUSCULAR | Status: DC | PRN

## 2012-05-12 MED ORDER — MENTHOL 3 MG MT LOZG: 1.0000 | LOZENGE | OROMUCOSAL | Status: DC | PRN

## 2012-05-12 MED ORDER — GENTAMICIN SULFATE 40 MG/ML IJ SOLN
INTRAMUSCULAR | Status: AC
  Administered 2012-05-12: 100 mL via INTRAVENOUS
  Filled 2012-05-12: qty 7.69

## 2012-05-12 MED ORDER — ONDANSETRON HCL 4 MG/2ML IJ SOLN
INTRAMUSCULAR | Status: DC | PRN
  Administered 2012-05-12: 4 mg via INTRAVENOUS

## 2012-05-12 MED ORDER — FENTANYL CITRATE 0.05 MG/ML IJ SOLN
25.0000 ug | INTRAMUSCULAR | Status: DC | PRN
  Administered 2012-05-12 (×2): 50 ug via INTRAVENOUS

## 2012-05-12 MED ORDER — SCOPOLAMINE 1 MG/3DAYS TD PT72
MEDICATED_PATCH | TRANSDERMAL | Status: DC | PRN
  Administered 2012-05-12: 1 via TRANSDERMAL

## 2012-05-12 MED ORDER — PROMETHAZINE HCL 25 MG/ML IJ SOLN: 6.2500 mg | INTRAMUSCULAR | Status: DC | PRN

## 2012-05-12 MED ORDER — PROPOFOL 10 MG/ML IV EMUL
INTRAVENOUS | Status: DC | PRN
  Administered 2012-05-12: 200 mg via INTRAVENOUS

## 2012-05-12 MED ORDER — DEXTROSE IN LACTATED RINGERS 5 % IV SOLN
INTRAVENOUS | Status: DC
  Administered 2012-05-12 – 2012-05-13 (×2): via INTRAVENOUS

## 2012-05-12 MED ORDER — DIPHENHYDRAMINE HCL 50 MG/ML IJ SOLN: 12.5000 mg | Freq: Four times a day (QID) | INTRAMUSCULAR | Status: DC | PRN

## 2012-05-12 MED ORDER — NEOSTIGMINE METHYLSULFATE 1 MG/ML IJ SOLN
INTRAMUSCULAR | Status: DC | PRN
  Administered 2012-05-12: 5 mg via INTRAVENOUS

## 2012-05-12 MED ORDER — IBUPROFEN 600 MG PO TABS
600.0000 mg | ORAL_TABLET | Freq: Four times a day (QID) | ORAL | Status: DC | PRN
  Administered 2012-05-13: 600 mg via ORAL
  Filled 2012-05-12: qty 1

## 2012-05-12 MED ORDER — FAMOTIDINE IN NACL 20-0.9 MG/50ML-% IV SOLN
20.0000 mg | Freq: Once | INTRAVENOUS | Status: AC
  Administered 2012-05-12: 20 mg via INTRAVENOUS

## 2012-05-12 MED ORDER — GLYCOPYRROLATE 0.2 MG/ML IJ SOLN
INTRAMUSCULAR | Status: DC | PRN
  Administered 2012-05-12: 1 mg via INTRAVENOUS

## 2012-05-12 MED ORDER — HYDROMORPHONE 0.3 MG/ML IV SOLN
INTRAVENOUS | Status: DC
  Administered 2012-05-12: 1.19 mg via INTRAVENOUS
  Administered 2012-05-12: 18:00:00 via INTRAVENOUS
  Administered 2012-05-13: 1.19 mg via INTRAVENOUS
  Administered 2012-05-13: 1.59 mg via INTRAVENOUS
  Administered 2012-05-13: 0.4 mg via INTRAVENOUS
  Filled 2012-05-12: qty 25

## 2012-05-12 MED ORDER — VENLAFAXINE HCL ER 150 MG PO CP24
150.0000 mg | ORAL_CAPSULE | Freq: Two times a day (BID) | ORAL | Status: DC
  Administered 2012-05-12 – 2012-05-13 (×2): 150 mg via ORAL
  Filled 2012-05-12 (×2): qty 1

## 2012-05-12 MED ORDER — DIPHENHYDRAMINE HCL 12.5 MG/5ML PO ELIX: 12.5000 mg | ORAL_SOLUTION | Freq: Four times a day (QID) | ORAL | Status: DC | PRN

## 2012-05-12 MED ORDER — TRAMADOL HCL 50 MG PO TABS
50.0000 mg | ORAL_TABLET | Freq: Four times a day (QID) | ORAL | Status: DC | PRN
  Administered 2012-05-13: 50 mg via ORAL
  Filled 2012-05-12: qty 1

## 2012-05-12 MED ORDER — GENTAMICIN SULFATE 40 MG/ML IJ SOLN
INTRAVENOUS | Status: DC
  Filled 2012-05-12: qty 10.68

## 2012-05-12 MED ORDER — ONDANSETRON HCL 4 MG/2ML IJ SOLN: 4.0000 mg | Freq: Four times a day (QID) | INTRAMUSCULAR | Status: DC | PRN

## 2012-05-12 MED ORDER — TOPIRAMATE 100 MG PO TABS
200.0000 mg | ORAL_TABLET | Freq: Every day | ORAL | Status: DC
  Administered 2012-05-12: 200 mg via ORAL
  Filled 2012-05-12: qty 2

## 2012-05-12 MED ORDER — IOHEXOL 300 MG/ML  SOLN
100.0000 mL | Freq: Once | INTRAMUSCULAR | Status: AC | PRN
  Administered 2012-05-12: 100 mL via INTRAVENOUS

## 2012-05-12 MED ORDER — BUPIVACAINE HCL (PF) 0.25 % IJ SOLN
INTRAMUSCULAR | Status: DC | PRN
  Administered 2012-05-12: 10 mL

## 2012-05-12 MED ORDER — MIDAZOLAM HCL 5 MG/5ML IJ SOLN
INTRAMUSCULAR | Status: DC | PRN
  Administered 2012-05-12: 2 mg via INTRAVENOUS

## 2012-05-12 MED ORDER — TEMAZEPAM 15 MG PO CAPS: 15.0000 mg | ORAL_CAPSULE | Freq: Every evening | ORAL | Status: DC | PRN

## 2012-05-12 MED ORDER — ONDANSETRON 8 MG PO TBDP: 8.0000 mg | ORAL_TABLET | Freq: Three times a day (TID) | ORAL | Status: DC | PRN

## 2012-05-12 MED ORDER — LACTATED RINGERS IV SOLN
INTRAVENOUS | Status: DC | PRN
  Administered 2012-05-12 (×3): via INTRAVENOUS

## 2012-05-12 MED ORDER — SODIUM CHLORIDE 0.9 % IV SOLN
INTRAVENOUS | Status: DC
  Administered 2012-05-12: 04:00:00 via INTRAVENOUS

## 2012-05-12 MED ORDER — LIDOCAINE HCL (CARDIAC) 20 MG/ML IV SOLN
INTRAVENOUS | Status: DC | PRN
  Administered 2012-05-12: 20 mg via INTRAVENOUS

## 2012-05-12 MED ORDER — ONDANSETRON HCL 4 MG/2ML IJ SOLN
4.0000 mg | Freq: Once | INTRAMUSCULAR | Status: AC
  Administered 2012-05-12: 4 mg via INTRAVENOUS
  Filled 2012-05-12: qty 2

## 2012-05-12 MED ORDER — KETOROLAC TROMETHAMINE 30 MG/ML IJ SOLN: 30.0000 mg | Freq: Once | INTRAMUSCULAR | Status: DC

## 2012-05-12 MED ORDER — ROCURONIUM BROMIDE 100 MG/10ML IV SOLN
INTRAVENOUS | Status: DC | PRN
  Administered 2012-05-12: 50 mg via INTRAVENOUS

## 2012-05-12 MED ORDER — FAMOTIDINE IN NACL 20-0.9 MG/50ML-% IV SOLN
INTRAVENOUS | Status: AC
  Administered 2012-05-12: 20 mg via INTRAVENOUS
  Filled 2012-05-12: qty 50

## 2012-05-12 MED ORDER — LACTATED RINGERS IV SOLN: INTRAVENOUS | Status: DC

## 2012-05-12 MED ORDER — SODIUM CHLORIDE 0.9 % IJ SOLN: 9.0000 mL | INTRAMUSCULAR | Status: DC | PRN

## 2012-05-12 MED ORDER — MEPERIDINE HCL 25 MG/ML IJ SOLN: 6.2500 mg | INTRAMUSCULAR | Status: DC | PRN

## 2012-05-12 MED ORDER — KETOROLAC TROMETHAMINE 30 MG/ML IJ SOLN: 15.0000 mg | Freq: Once | INTRAMUSCULAR | Status: DC | PRN

## 2012-05-12 SURGICAL SUPPLY — 40 items
ADH SKN CLS LQ APL DERMABOND (GAUZE/BANDAGES/DRESSINGS) ×2
BAG SPEC RTRVL LRG 6X4 10 (ENDOMECHANICALS)
CABLE HIGH FREQUENCY MONO STRZ (ELECTRODE) IMPLANT
CATH ROBINSON RED A/P 16FR (CATHETERS) ×1 IMPLANT
CHLORAPREP W/TINT 26ML (MISCELLANEOUS) ×3 IMPLANT
CLOTH BEACON ORANGE TIMEOUT ST (SAFETY) ×3 IMPLANT
DERMABOND ADHESIVE PROPEN (GAUZE/BANDAGES/DRESSINGS) ×1
DERMABOND ADVANCED .7 DNX6 (GAUZE/BANDAGES/DRESSINGS) ×1 IMPLANT
DRSG COVADERM 4X10 (GAUZE/BANDAGES/DRESSINGS) ×2 IMPLANT
EVACUATOR SMOKE 8.L (FILTER) IMPLANT
GLOVE BIO SURGEON STRL SZ 6.5 (GLOVE) ×2 IMPLANT
GOWN PREVENTION PLUS LG XLONG (DISPOSABLE) ×6 IMPLANT
NDL INSUFFLATION 14GA 120MM (NEEDLE) ×1 IMPLANT
NEEDLE HYPO 22GX1.5 SAFETY (NEEDLE) ×2 IMPLANT
NEEDLE INSUFFLATION 14GA 120MM (NEEDLE) IMPLANT
NS IRRIG 1000ML POUR BTL (IV SOLUTION) ×3 IMPLANT
PACK ABDOMINAL GYN (CUSTOM PROCEDURE TRAY) ×2 IMPLANT
PACK LAPAROSCOPY BASIN (CUSTOM PROCEDURE TRAY) ×3 IMPLANT
POUCH SPECIMEN RETRIEVAL 10MM (ENDOMECHANICALS) IMPLANT
PROTECTOR NERVE ULNAR (MISCELLANEOUS) ×3 IMPLANT
SEALER TISSUE G2 CVD JAW 35 (ENDOMECHANICALS) IMPLANT
SEALER TISSUE G2 CVD JAW 45CM (ENDOMECHANICALS) IMPLANT
SET IRRIG TUBING LAPAROSCOPIC (IRRIGATION / IRRIGATOR) IMPLANT
STAPLER VISISTAT 35W (STAPLE) ×2 IMPLANT
SUT PLAIN 2 0 XLH (SUTURE) ×2 IMPLANT
SUT VIC AB 0 CT1 18XCR BRD8 (SUTURE) ×1 IMPLANT
SUT VIC AB 0 CT1 27 (SUTURE) ×6
SUT VIC AB 0 CT1 27XBRD ANBCTR (SUTURE) ×2 IMPLANT
SUT VIC AB 0 CT1 8-18 (SUTURE) ×3
SUT VIC AB 3-0 PS2 18 (SUTURE)
SUT VIC AB 3-0 PS2 18XBRD (SUTURE) ×1 IMPLANT
SUT VIC AB 4-0 KS 27 (SUTURE) ×2 IMPLANT
SUT VICRYL 0 TIES 12 18 (SUTURE) ×2 IMPLANT
SUT VICRYL 0 UR6 27IN ABS (SUTURE) ×1 IMPLANT
SYR CONTROL 10ML LL (SYRINGE) ×2 IMPLANT
TOWEL OR 17X24 6PK STRL BLUE (TOWEL DISPOSABLE) ×6 IMPLANT
TROCAR Z-THREAD BLADED 11X100M (TROCAR) ×2 IMPLANT
TROCAR Z-THREAD BLADED 5X100MM (TROCAR) IMPLANT
WARMER LAPAROSCOPE (MISCELLANEOUS) ×1 IMPLANT
WATER STERILE IRR 1000ML POUR (IV SOLUTION) ×3 IMPLANT

## 2012-05-12 NOTE — Brief Op Note (Signed)
05/12/2012  4:03 PM  PATIENT:  Amy Lang  46 y.o. female  PRE-OPERATIVE DIAGNOSIS:  Left ovarian torsion  POST-OPERATIVE DIAGNOSIS:  Same  PROCEDURE:  Procedure(s) (LRB) with comments: LAPAROTOMY () - laparotomy /Bilateral salpingectomy-oopharectomy  SURGEON:  Surgeon(s) and Role:    * Jeani Hawking, MD - Primary  PHYSICIAN ASSISTANT:   ASSISTANTS: none   ANESTHESIA:   general  EBL:  Total I/O In: 1000 [I.V.:1000] Out: 425 [Urine:300; Blood:125]  BLOOD ADMINISTERED:none  DRAINS: Urinary Catheter (Foley)   LOCAL MEDICATIONS USED:  NONE  SPECIMEN:  Source of Specimen:  bilateral tubes and ovaries  DISPOSITION OF SPECIMEN:  PATHOLOGY  COUNTS:  YES  TOURNIQUET:  * No tourniquets in log *  DICTATION: .Other Dictation: Dictation Number [id number]  PLAN OF CARE: Admit to inpatient   PATIENT DISPOSITION:  PACU - hemodynamically stable.   Delay start of Pharmacological VTE agent (>24hrs) due to surgical blood loss or risk of bleeding: not applicable

## 2012-05-12 NOTE — MAU Note (Signed)
Pt reports having LL back pain for the past 3 weeks. Pain has come around the front LL abd.  Went to [facility]. Had CT scan showed she had a mass on her left ovary. Told to follow up in office on Monday. Pt still having  A lot of pain. Took 2 percocet at 8 and did not get any releif from pain. Pain is making her nauseated as well.

## 2012-05-12 NOTE — ED Notes (Addendum)
Pt states that she has hx of ovarian cysts and endometriosis. Also stated that she has had lower back pain for about 3 weeks with left lower abdominal pain at night but tonight the pain has been more intense. C/o nausea but denies vomiting/diarrhea, constipation, blood in urine or stool.

## 2012-05-12 NOTE — Plan of Care (Signed)
Problem: Phase I Progression Outcomes Goal: I & O every 4 hrs or as ordered Outcome: Completed/Met Date Met:  05/12/12 Very good  Urine output via foley clear yellow urine.

## 2012-05-12 NOTE — Plan of Care (Signed)
Problem: Phase II Progression Outcomes Goal: Remove staples if indicated/incision care Outcome: Not Applicable Date Met:  05/12/12 Incision is Dermabonded

## 2012-05-12 NOTE — ED Notes (Signed)
Pain in back 3 weeks,  And left lower quadrant pain, nausea no vomiting,

## 2012-05-12 NOTE — Anesthesia Preprocedure Evaluation (Signed)
Anesthesia Evaluation  Patient identified by MRN, date of birth, ID band Patient awake    Reviewed: Allergy & Precautions, H&P , NPO status , Patient's Chart, lab work & pertinent test results  Airway Mallampati: III TM Distance: >3 FB Neck ROM: full    Dental No notable dental hx. (+) Teeth Intact   Pulmonary    Pulmonary exam normal       Cardiovascular negative cardio ROS      Neuro/Psych    GI/Hepatic negative GI ROS, Neg liver ROS,   Endo/Other  negative endocrine ROS  Renal/GU negative Renal ROS  negative genitourinary   Musculoskeletal negative musculoskeletal ROS (+)   Abdominal (+) + obese,   Peds  Hematology  (+) REFUSES BLOOD PRODUCTS,   Anesthesia Other Findings   Reproductive/Obstetrics negative OB ROS                           Anesthesia Physical Anesthesia Plan  ASA: III and emergent  Anesthesia Plan: General   Post-op Pain Management:    Induction: Intravenous  Airway Management Planned: Oral ETT  Additional Equipment:   Intra-op Plan:   Post-operative Plan:   Informed Consent: I have reviewed the patients History and Physical, chart, labs and discussed the procedure including the risks, benefits and alternatives for the proposed anesthesia with the patient or authorized representative who has indicated his/her understanding and acceptance.   Dental Advisory Given and History available from chart only  Plan Discussed with: CRNA and Surgeon  Anesthesia Plan Comments:         Anesthesia Quick Evaluation

## 2012-05-12 NOTE — MAU Note (Signed)
After call from lab, Dr. Adalberto Ill notified to see if any lab orders needed. Dr. Adalberto Ill said no lab orders needed.

## 2012-05-12 NOTE — MAU Provider Note (Signed)
History     CSN: [id number]  Arrival date and time: 05/12/12 1149   First Provider Initiated Contact with Patient 05/12/12 1229      Chief Complaint  Patient presents with  . Back Pain   HPI JILLENE WEHRENBERG is a 46 y.o. female who presents to MAU with abdominal pain. The pain started 3 weeks ago but started as a dull pain in the lower back. Over the past few days got worse and yesterday was severe. Went to [facility] ED this morning approximately 3 am and had work up including CT of abdomen that showed a mass on the left ovary. The patient was treated with pain medication and planned to follow up with Dr. Jennette Kettle for ultrasound. However the pain increased and she came to MAU. The history was provided by the patient and her medical record.  OB History    Grav Para Term Preterm Abortions TAB SAB Ect Mult Living                  Past Medical History  Diagnosis Date  . Vaginal cancer   . Asthma   . Migraines   . PONV (postoperative nausea and vomiting)   . Heart murmur   . Shortness of breath   . Obesity   . Anxiety   . Depression   . Allergy     Past Surgical History  Procedure Date  . Knee surgery   . Abdominal hysterectomy     still has bilateral ovaries  . Cholecystectomy 04-04-11    single site    Family History  Problem Relation Age of Onset  . Heart disease Mother   . COPD Mother   . Cancer Father     Lung  . Heart disease Maternal Grandmother   . Heart disease Maternal Grandfather   . Heart disease Paternal Grandmother   . Heart disease Paternal Grandfather     History  Substance Use Topics  . Smoking status: Never Smoker   . Smokeless tobacco: Not on file     Comment: Regular Exercise - No  . Alcohol Use: No    Allergies:  Allergies  Allergen Reactions  . Bee Pollen Anaphylaxis  . Butorphanol Tartrate Other (See Comments)    REACTION: hallucinations  . Codeine Sulfate Hives and Nausea And Vomiting  . Crestor (Rosuvastatin) Other (See Comments)    Body  aches  . Darvocet (Propoxyphene-Acetaminophen) Hives and Nausea And Vomiting  . Levofloxacin Other (See Comments)    abdominal pain  . Penicillins Other (See Comments)    Seizure   . Sulfa Antibiotics Hives and Nausea And Vomiting  . Latex Rash    Blisters and redness with bandaids-also sensitivity to latex gloves    Prescriptions prior to admission  Medication Sig Dispense Refill  . aspirin EC 81 MG tablet Take 81 mg by mouth every evening.       . cetirizine (ZYRTEC) 10 MG tablet Take 10 mg by mouth daily.        . Cyanocobalamin (VITAMIN B-12) 1000 MCG SUBL Place 1 tablet (1,000 mcg total) under the tongue daily.  100 tablet  3  . eletriptan (RELPAX) 40 MG tablet One tablet by mouth at onset of headache. May repeat in 2 hours if headache persists or recurs. For migraine. Take 1 tablet, may repeat in 2 hours if necessary      . Multiple Vitamin (MULTIVITAMIN WITH MINERALS) TABS Take 1 tablet by mouth daily.      [name]  ondansetron (ZOFRAN ODT) 8 MG disintegrating tablet Take 1 tablet (8 mg total) by mouth every 8 (eight) hours as needed for nausea.  20 tablet  0  . oxyCODONE-acetaminophen (PERCOCET/ROXICET) 5-325 MG per tablet Take 1-2 tablets by mouth every 6 (six) hours as needed for pain.  30 tablet  0  . promethazine (PHENERGAN) 25 MG tablet Take 25-50 mg by mouth every 6 (six) hours as needed. For vomiting from migraine      . promethazine (PHENERGAN) 50 MG suppository Place 50 mg rectally every 6 (six) hours as needed.      . topiramate (TOPAMAX) 200 MG tablet Take 1 tablet (200 mg total) by mouth at bedtime.  90 tablet  3  . venlafaxine XR (EFFEXOR-XR) 150 MG 24 hr capsule Take 1 capsule (150 mg total) by mouth 2 (two) times daily.  180 capsule  1  . vitamin C (ASCORBIC ACID) 500 MG tablet Take 500 mg by mouth daily.         Review of Systems  Constitutional: Negative for fever, chills and weight loss.  HENT: Negative for ear pain, nosebleeds, congestion, sore throat and neck pain.    Eyes: Negative for blurred vision, double vision, photophobia and pain.  Respiratory: Negative for cough, shortness of breath and wheezing.   Cardiovascular: Negative for chest pain, palpitations and leg swelling.  Gastrointestinal: Positive for nausea and abdominal pain. Negative for heartburn, diarrhea and constipation.  Genitourinary: Positive for frequency. Negative for dysuria and urgency.  Musculoskeletal: Positive for back pain. Negative for myalgias.  Skin: Negative for itching and rash.  Neurological: Negative for dizziness, sensory change, speech change, seizures, weakness and headaches.  Endo/Heme/Allergies: Does not bruise/bleed easily.  Psychiatric/Behavioral: Negative for depression. The patient is not nervous/anxious and does not have insomnia.    Physical Exam   Blood pressure 153/75, pulse 82, temperature 97.8 F (36.6 C), temperature source Oral, resp. rate 18, height 5' (1.524 m), weight 188 lb 3.2 oz (85.367 kg).  Physical Exam  Nursing note and vitals reviewed. Constitutional: She is oriented to person, place, and time. She appears well-developed and well-nourished. No distress.  HENT:  Head: Normocephalic and atraumatic.  Eyes: EOM are normal.  Neck: Neck supple.  Cardiovascular: Normal rate.   Respiratory: Effort normal.  GI: Soft. Bowel sounds are normal. There is tenderness in the left lower quadrant. There is guarding. There is no rigidity, no rebound and no CVA tenderness.  Musculoskeletal: Normal range of motion.  Neurological: She is alert and oriented to person, place, and time.  Skin: Skin is warm and dry.  Psychiatric: She has a normal mood and affect. Her behavior is normal. Judgment and thought content normal.   Results for orders placed during the hospital encounter of 05/12/12 (from the past 24 hour(s))  URINALYSIS, ROUTINE W REFLEX MICROSCOPIC     Status: Abnormal   Collection Time   05/12/12  3:14 AM      Component Value Range   Color, Urine  YELLOW  YELLOW   APPearance CLOUDY (*) CLEAR   Specific Gravity, Urine 1.016  1.005 - 1.030   pH 7.0  5.0 - 8.0   Glucose, UA NEGATIVE  NEGATIVE mg/dL   Hgb urine dipstick NEGATIVE  NEGATIVE   Bilirubin Urine NEGATIVE  NEGATIVE   Ketones, ur NEGATIVE  NEGATIVE mg/dL   Protein, ur NEGATIVE  NEGATIVE mg/dL   Urobilinogen, UA 1.0  0.0 - 1.0 mg/dL   Nitrite NEGATIVE  NEGATIVE   Leukocytes, UA NEGATIVE  NEGATIVE  BASIC METABOLIC PANEL     Status: Abnormal   Collection Time   05/12/12  3:58 AM      Component Value Range   Sodium 134 (*) 135 - 145 mEq/L   Potassium 3.3 (*) 3.5 - 5.1 mEq/L   Chloride 101  96 - 112 mEq/L   CO2 19  19 - 32 mEq/L   Glucose, Bld 200 (*) 70 - 99 mg/dL   BUN 10  6 - 23 mg/dL   Creatinine, Ser 4.09  0.50 - 1.10 mg/dL   Calcium 9.0  8.4 - 81.1 mg/dL   GFR calc non Af Amer >90  >90 mL/min   GFR calc Af Amer >90  >90 mL/min  CBC WITH DIFFERENTIAL     Status: Normal   Collection Time   05/12/12  3:58 AM      Component Value Range   WBC 7.8  4.0 - 10.5 K/uL   RBC 4.42  3.87 - 5.11 MIL/uL   Hemoglobin 13.3  12.0 - 15.0 g/dL   HCT 91.4  78.2 - 95.6 %   MCV 88.2  78.0 - 100.0 fL   MCH 30.1  26.0 - 34.0 pg   MCHC 34.1  30.0 - 36.0 g/dL   RDW 21.3  08.6 - 57.8 %   Platelets 217  150 - 400 K/uL   Neutrophils Relative 68  43 - 77 %   Neutro Abs 5.3  1.7 - 7.7 K/uL   Lymphocytes Relative 23  12 - 46 %   Lymphs Abs 1.8  0.7 - 4.0 K/uL   Monocytes Relative 6  3 - 12 %   Monocytes Absolute 0.5  0.1 - 1.0 K/uL   Eosinophils Relative 2  0 - 5 %   Eosinophils Absolute 0.2  0.0 - 0.7 K/uL   Basophils Relative 0  0 - 1 %   Basophils Absolute 0.0  0.0 - 0.1 K/uL   US Pelvis Complete  05/12/2012  *RADIOLOGY REPORT*  Clinical Data:  Severe left pelvic pain and tenderness.  Left adnexal mass seen on CT.  Prior hysterectomy.  TRANSABDOMINAL AND TRANSVAGINAL ULTRASOUND OF PELVIS DOPPLER ULTRASOUND OF OVARIES  Technique:  Both transabdominal and transvaginal ultrasound  examinations of the pelvis were performed. Transabdominal technique was performed for global imaging of the pelvis including uterus, ovaries, adnexal regions, and pelvic cul-de-sac.  It was necessary to proceed with endovaginal exam following the transabdominal exam to visualize the vaginal cuff and ovaries.  Color and duplex Doppler ultrasound was utilized to evaluate blood flow to the ovaries.  Comparison:  None.  Findings:  Uterus:  Surgically absent.  Vaginal cuff is unremarkable in appearance.  Right ovary: not directly visualized by transabdominal or transvaginal sonography, however no adnexal mass identified.  Left ovary:   Enlarged, measuring 7.7 x 4.8 x 5.5 cm.  A simple cyst is seen within the ovary measuring 3.9 cm. Too small or corpus luteumis also seen in the ovary measuring 2.5 cm.  A few other tiny follicles are noted.  No internal blood flow is seen within the left ovarian tissue on color Doppler ultrasound.  Pulsed Doppler and power Doppler evaluation of the left ovary also shows no evidence of internal blood flow.  Small amount of free fluid is seen in the left adnexa.  IMPRESSION:  1.  Enlarged left ovary with no internal blood flow identified, consistent with left ovarian torsion. 2.  Small amount of free fluid in left adnexa. 3.  Previous hysterectomy.  Nonvisualization of right ovary, however no right adnexal mass identified.  Critical Value/emergent results were called by telephone at the time of interpretation on 05/12/2012 at 1350 hours to the patient's MAU nurse Lorene Dy, who verbally acknowledged these results.   Original Report Authenticated By: Myles Rosenthal, M.D.    Ct Abdomen Pelvis W Contrast  05/12/2012  *RADIOLOGY REPORT*  Clinical Data: Left lower quadrant and low back pain for 3 weeks.  CT ABDOMEN AND PELVIS WITH CONTRAST  Technique:  Multidetector CT imaging of the abdomen and pelvis was performed following the standard protocol during bolus administration of intravenous  contrast.  Contrast: OMNIPAQUE IOHEXOL 300 MG/ML  SOLN  Comparison: None.  Findings: The lung bases are clear.  Surgical absence of the gallbladder.  The liver, spleen, pancreas, adrenal glands, kidneys, abdominal aorta, and retroperitoneal lymph nodes are unremarkable.  The stomach, small bowel, and colon are not abnormally distended.  Diffusely stool filled colon.  No free air or free fluid in the abdomen.  Pelvis:  The bladder wall is not thickened.  Uterus is surgically absent.  There is a small amount of free fluid in the pelvis which may be physiologic.  The left ovary is enlarged and complex appearing, measuring up to 3.9 x 6.4 cm.  There is a complex peripherally enhancing portion measuring about 2.5 cm diameter. These may represent ovarian cysts with involuting cyst.  However, endometriosis can also have this appearance.  Consider ultrasound for better characterization.  The right ovary is not enlarged.  The appendix is normal.  Degenerative changes in the lumbar spine.  IMPRESSION: Complex along the appearance of the left ovary with areas of enhancement and cystic change.  Consider endometriosis versus a complex cystic lesion.  Ultrasound recommended for further evaluation.  Small amount of free fluid in the pelvis.   Original Report Authenticated By: Burman Nieves, M.D.    Ultrasound report called from Radiologist, critical report, left ovarian torsion.  Assessment: 46 y.o. female with abdominal pain   Left ovarian torsion  Plan:  NPO   Prep for OR   Dr. Vincente Poli in route to MAU  MAU Course  Procedures  NEESE,HOPE, RN, FNP, [facility] 05/12/2012, 12:31 PM

## 2012-05-12 NOTE — Transfer of Care (Signed)
Immediate Anesthesia Transfer of Care Note  Patient: Amy Lang  Procedure(s) Performed: Procedure(s) (LRB) with comments: LAPAROTOMY () - laparotomy /Bilateral salpingectomy-oopharectomy  Patient Location: PACU  Anesthesia Type:General  Level of Consciousness: awake, alert  and oriented  Airway & Oxygen Therapy: Patient Spontanous Breathing and Patient connected to nasal cannula oxygen  Post-op Assessment: Report given to PACU RN and Post -op Vital signs reviewed and stable  Post vital signs: Reviewed and stable  Complications: No apparent anesthesia complications

## 2012-05-12 NOTE — ED Provider Notes (Signed)
History     CSN: [id number]  Arrival date & time 05/12/12  0302   First MD Initiated Contact with Patient 05/12/12 [phone]      Chief Complaint  Patient presents with  . Abdominal Pain    (Consider location/radiation/quality/duration/timing/severity/associated sxs/prior treatment) HPI This is a 46 year old female who's been having pain in her right lower back for about 3 weeks. This pain has been mild to moderate and she has not sought treatment for her. Also over the past 3 weeks she has had left lower quadrant pain when lying in bed at night. It also had been mild to moderate and she had not sought treatment. This morning the pain in her left lower quadrant but became severe. She is having difficulty characterizing it but states it may be similar to previous ovarian cyst. The pain is worse with palpation or movement. It is been associated with nausea but no vomiting. She denies constipation but states she has had chronic diarrhea since her cholecystectomy. She denies fever, chills, dysuria or hematuria. She has had a hysterectomy but without oophorectomy.  Past Medical History  Diagnosis Date  . Vaginal cancer   . Asthma   . Migraines   . PONV (postoperative nausea and vomiting)   . Heart murmur   . Shortness of breath   . Obesity   . Anxiety   . Depression   . Allergy     Past Surgical History  Procedure Date  . Knee surgery   . Abdominal hysterectomy     still has bilateral ovaries  . Cholecystectomy 04-04-11    single site    Family History  Problem Relation Age of Onset  . Heart disease Mother   . COPD Mother   . Cancer Father     Lung  . Heart disease Maternal Grandmother   . Heart disease Maternal Grandfather   . Heart disease Paternal Grandmother   . Heart disease Paternal Grandfather     History  Substance Use Topics  . Smoking status: Never Smoker   . Smokeless tobacco: Not on file     Comment: Regular Exercise - No  . Alcohol Use: No    OB History     Grav Para Term Preterm Abortions TAB SAB Ect Mult Living                  Review of Systems  All other systems reviewed and are negative.    Allergies  Bee pollen; Butorphanol tartrate; Codeine sulfate; Crestor; Darvocet; Levofloxacin; Penicillins; Sulfa antibiotics; and Latex  Home Medications   Current Outpatient Rx  Name  Route  Sig  Dispense  Refill  . ASPIRIN EC 81 MG PO TBEC   Oral   Take 81 mg by mouth every evening.          [name] CETIRIZINE HCL 10 MG PO TABS   Oral   Take 10 mg by mouth daily.           [name] ELETRIPTAN HYDROBROMIDE 40 MG PO TABS   Oral   One tablet by mouth at onset of headache. May repeat in 2 hours if headache persists or recurs. For migraine. Take 1 tablet, may repeat in 2 hours if necessary         . ADULT MULTIVITAMIN W/MINERALS CH   Oral   Take 1 tablet by mouth daily.         [name] PROMETHAZINE HCL 25 MG PO TABS   Oral   Take 25-50  mg by mouth every 6 (six) hours as needed. For vomiting from migraine         . TOPIRAMATE 200 MG PO TABS   Oral   Take 1 tablet (200 mg total) by mouth at bedtime.   90 tablet   3   . VENLAFAXINE HCL ER 150 MG PO CP24   Oral   Take 1 capsule (150 mg total) by mouth 2 (two) times daily.   180 capsule   1   . VITAMIN C 500 MG PO TABS   Oral   Take 500 mg by mouth daily.          [name] VITAMIN B-12 1000 MCG SL SUBL   Sublingual   Place 1 tablet (1,000 mcg total) under the tongue daily.   100 tablet   3   . PROMETHAZINE HCL 50 MG RE SUPP   Rectal   Place 50 mg rectally every 6 (six) hours as needed.           BP 155/68  Pulse 71  Temp 98 F (36.7 C) (Oral)  Resp 16  SpO2 100%  Physical Exam General: Well-developed, well-nourished female in no acute distress; appearance consistent with age of record HENT: normocephalic, atraumatic Eyes: pupils equal round and reactive to light; extraocular muscles intact Neck: supple Heart: regular rate and rhythm Lungs: clear to auscultation  bilaterally Abdomen: soft; nondistended; significant left lower quadrant left upper quadrant tenderness; bowel sounds present GU: No CVA tenderness; normal external genitalia; vaginal bleeding; no cervix palpated; no adnexal tenderness Extremities: No deformity; full range of motion; pulses normal Neurologic: Awake, alert and oriented; motor function intact in all extremities and symmetric; no facial droop Skin: Warm and dry Psychiatric: Normal mood and affect    ED Course  Procedures (including critical care time)     MDM   Nursing notes and vitals signs, including pulse oximetry, reviewed.  Summary of this visit's results, reviewed by myself:  Labs:  Results for orders placed during the hospital encounter of 05/12/12 (from the past 24 hour(s))  URINALYSIS, ROUTINE W REFLEX MICROSCOPIC     Status: Abnormal   Collection Time   05/12/12  3:14 AM      Component Value Range   Color, Urine YELLOW  YELLOW   APPearance CLOUDY (*) CLEAR   Specific Gravity, Urine 1.016  1.005 - 1.030   pH 7.0  5.0 - 8.0   Glucose, UA NEGATIVE  NEGATIVE mg/dL   Hgb urine dipstick NEGATIVE  NEGATIVE   Bilirubin Urine NEGATIVE  NEGATIVE   Ketones, ur NEGATIVE  NEGATIVE mg/dL   Protein, ur NEGATIVE  NEGATIVE mg/dL   Urobilinogen, UA 1.0  0.0 - 1.0 mg/dL   Nitrite NEGATIVE  NEGATIVE   Leukocytes, UA NEGATIVE  NEGATIVE  BASIC METABOLIC PANEL     Status: Abnormal   Collection Time   05/12/12  3:58 AM      Component Value Range   Sodium 134 (*) 135 - 145 mEq/L   Potassium 3.3 (*) 3.5 - 5.1 mEq/L   Chloride 101  96 - 112 mEq/L   CO2 19  19 - 32 mEq/L   Glucose, Bld 200 (*) 70 - 99 mg/dL   BUN 10  6 - 23 mg/dL   Creatinine, Ser 1.61  0.50 - 1.10 mg/dL   Calcium 9.0  8.4 - 09.6 mg/dL   GFR calc non Af Amer >90  >90 mL/min   GFR calc Af Amer >90  >90 mL/min  CBC WITH DIFFERENTIAL     Status: Normal   Collection Time   05/12/12  3:58 AM      Component Value Range   WBC 7.8  4.0 - 10.5 K/uL   RBC  4.42  3.87 - 5.11 MIL/uL   Hemoglobin 13.3  12.0 - 15.0 g/dL   HCT 19.1  47.8 - 29.5 %   MCV 88.2  78.0 - 100.0 fL   MCH 30.1  26.0 - 34.0 pg   MCHC 34.1  30.0 - 36.0 g/dL   RDW 62.1  30.8 - 65.7 %   Platelets 217  150 - 400 K/uL   Neutrophils Relative 68  43 - 77 %   Neutro Abs 5.3  1.7 - 7.7 K/uL   Lymphocytes Relative 23  12 - 46 %   Lymphs Abs 1.8  0.7 - 4.0 K/uL   Monocytes Relative 6  3 - 12 %   Monocytes Absolute 0.5  0.1 - 1.0 K/uL   Eosinophils Relative 2  0 - 5 %   Eosinophils Absolute 0.2  0.0 - 0.7 K/uL   Basophils Relative 0  0 - 1 %   Basophils Absolute 0.0  0.0 - 0.1 K/uL    Imaging Studies: Ct Abdomen Pelvis W Contrast  05/12/2012  *RADIOLOGY REPORT*  Clinical Data: Left lower quadrant and low back pain for 3 weeks.  CT ABDOMEN AND PELVIS WITH CONTRAST  Technique:  Multidetector CT imaging of the abdomen and pelvis was performed following the standard protocol during bolus administration of intravenous contrast.  Contrast: OMNIPAQUE IOHEXOL 300 MG/ML  SOLN  Comparison: None.  Findings: The lung bases are clear.  Surgical absence of the gallbladder.  The liver, spleen, pancreas, adrenal glands, kidneys, abdominal aorta, and retroperitoneal lymph nodes are unremarkable.  The stomach, small bowel, and colon are not abnormally distended.  Diffusely stool filled colon.  No free air or free fluid in the abdomen.  Pelvis:  The bladder wall is not thickened.  Uterus is surgically absent.  There is a small amount of free fluid in the pelvis which may be physiologic.  The left ovary is enlarged and complex appearing, measuring up to 3.9 x 6.4 cm.  There is a complex peripherally enhancing portion measuring about 2.5 cm diameter. These may represent ovarian cysts with involuting cyst.  However, endometriosis can also have this appearance.  Consider ultrasound for better characterization.  The right ovary is not enlarged.  The appendix is normal.  Degenerative changes in the lumbar  spine.  IMPRESSION: Complex along the appearance of the left ovary with areas of enhancement and cystic change.  Consider endometriosis versus a complex cystic lesion.  Ultrasound recommended for further evaluation.  Small amount of free fluid in the pelvis.   Original Report Authenticated By: Burman Nieves, M.D.     6:31 AM Patient was advised of her CT findings. She has an OB/GYN with whom she will follow up. She prefers this over waiting for an ultrasound in the ED.          Hanley Seamen, MD 05/12/12 [phone]

## 2012-05-12 NOTE — Anesthesia Postprocedure Evaluation (Signed)
Anesthesia Post Note  Patient: Amy Lang  Procedure(s) Performed: Procedure(s) (LRB): LAPAROTOMY ()  Anesthesia type: General  Patient location: PACU  Post pain: Pain level controlled  Post assessment: Post-op Vital signs reviewed  Last Vitals:  Filed Vitals:   05/12/12 1700  BP: 130/70  Pulse: 99  Temp:   Resp: 16    Post vital signs: Reviewed  Level of consciousness: sedated  Complications: No apparent anesthesia complications

## 2012-05-12 NOTE — Progress Notes (Signed)
46 year old female developed acute onset of LLQ pain last night - went to [facility] and had and evaluation. CT Showed left ovarian mass. She was sent home. I was never called about this patient when she was at [facility]. She then called my nursing service when her pain did not improve and we advised [facility] to come [facility] and she did. An ultrasound consistent with left ovarian torsion.  History significant for multiple laparoscopies for endometriosis and ovarian cysts and lap Chole. She is telling me she wants BOTH ovaries removed.  Afebrile  Abdomen appears very distended to me I discussed her need for surgery now. I think she may be at some risk with laparoscopy because of multiple surgeries in the past and possible adhesions. I think minilaparotomy is safest route. She agrees. Consented about risks of surgery.  Will proceed with Laparotomy, BSO possible LOA  OR is getting ready now

## 2012-05-12 NOTE — Anesthesia Procedure Notes (Signed)
Procedure Name: Intubation Date/Time: 05/12/2012 3:19 PM Performed by: Lincoln Brigham Pre-anesthesia Checklist: Patient identified, Patient being monitored, Emergency Drugs available, Timeout performed and Suction available Patient Re-evaluated:Patient Re-evaluated prior to inductionOxygen Delivery Method: Circle system utilized Preoxygenation: Pre-oxygenation with 100% oxygen Intubation Type: IV induction Ventilation: Mask ventilation without difficulty Laryngoscope Size: Miller and 2 Grade View: Grade II Tube type: Oral Tube size: 7.0 mm Number of attempts: 1 Placement Confirmation: ETT inserted through vocal cords under direct vision,  breath sounds checked- equal and bilateral and positive ETCO2 Secured at: 22 cm Tube secured with: Tape Dental Injury: Teeth and Oropharynx as per pre-operative assessment

## 2012-05-12 NOTE — MAU Note (Signed)
Patient reports right lower back pain x 3 weeks; late yesterday evening pain began radiating to L lower abdominal pain. Reports 10/10 pain. Denies vaginal bleeding. Was seen at [facility] ED today; was told she had a mass on L side and they wanted her to follow up with Dr. Jennette Kettle tomorrow. Given Percocet and Zofran; within two hours of being home with meds pain became intolerable and came here.

## 2012-05-12 NOTE — ED Notes (Signed)
CT notified that pt is done with contrast.

## 2012-05-13 ENCOUNTER — Encounter (HOSPITAL_COMMUNITY): Payer: Self-pay | Admitting: *Deleted

## 2012-05-13 LAB — BASIC METABOLIC PANEL
CO2: 21 mEq/L (ref 19–32)
Calcium: 9 mg/dL (ref 8.4–10.5)
Creatinine, Ser: 0.61 mg/dL (ref 0.50–1.10)
GFR calc non Af Amer: >90 mL/min (ref >90)
Glucose, Bld: 177 mg/dL — ABNORMAL HIGH (ref 70–99)

## 2012-05-13 LAB — CBC
MCH: 30.5 pg (ref 26.0–34.0)
MCV: 89.3 fL (ref 78.0–100.0)
Platelets: 205 10*3/uL (ref 150–400)
RDW: 13.1 % (ref 11.5–15.5)

## 2012-05-13 MED ORDER — IBUPROFEN 600 MG PO TABS: 600.0000 mg | ORAL_TABLET | Freq: Four times a day (QID) | ORAL | Status: DC | PRN

## 2012-05-13 NOTE — Progress Notes (Signed)
Pt discharged to home with husband.  Condition stable.  Pt ambulated to car with E. Pinion, NT.  No equipment for home ordered at discharge.

## 2012-05-13 NOTE — Anesthesia Postprocedure Evaluation (Signed)
  Anesthesia Post-op Note  Patient: Amy Lang  Procedure(s) Performed: Procedure(s) (LRB) with comments: LAPAROTOMY () - laparotomy /Bilateral salpingectomy-oopharectomy  Patient Location: [facility]  Anesthesia Type:General  Level of Consciousness: awake, alert  and oriented  Airway and Oxygen Therapy: Patient Spontanous Breathing  Post-op Pain: none  Post-op Assessment: Post-op Vital signs reviewed and Patient's Cardiovascular Status Stable  Post-op Vital Signs: Reviewed and stable  Complications: No apparent anesthesia complications

## 2012-05-13 NOTE — Plan of Care (Signed)
Problem: Phase I Progression Outcomes Goal: Dangle/OOB as tolerated per MD order Outcome: Completed/Met Date Met:  05/13/12 Ambulated to nurses station and to the doors of AICU and back and tolerated very well

## 2012-05-13 NOTE — Progress Notes (Signed)
Pt feeling good.  Less pain than prior to surgery.  Tolerating regular diet.  No n/v.  Ambulating.  Af, VSS Gen - NAD Abd - soft, NT/ND Inc - c/d/i  A/P:  POD1 s/p laparotomy w/ BSO Ambulate D/C PCA & IVF D/c home after lunch if tol PO pain meds

## 2012-05-13 NOTE — Op Note (Signed)
Amy Lang, Amy Lang                ACCOUNT NO.:  [id number]  MEDICAL RECORD NO.:  [id number]  LOCATION:  9318                          FACILITY:  [facility]  PHYSICIAN:  Tylar Merendino L. Albina Gosney, M.D.DATE OF BIRTH:  1965-06-12  DATE OF PROCEDURE:  05/12/2012 DATE OF DISCHARGE:                              OPERATIVE REPORT   PREOPERATIVE DIAGNOSIS:  Left ovarian torsion.  POSTOPERATIVE DIAGNOSIS:  Left ovarian torsion.  PROCEDURE:  Mini laparotomy and bilateral salpingo-oophorectomy.  SURGEON:  Jahn Franchini L. Vincente Poli, MD  ANESTHESIA:  General.  EBL:  Minimal.  COMPLICATIONS:  None.  DRAINS:  Foley catheter.  PATHOLOGY:  Bilateral tubes and ovaries.  PROCEDURE:  The patient was taken to the operating room.  She was intubated.  She was prepped and draped.  A Foley catheter was inserted. A small mini laparotomy incision was made, carried down to the fascia. Fascia scored in the midline and extended laterally.  Rectus muscles were separated in the midline.  The peritoneum was entered bluntly. Upon entry into the peritoneal cavity, we noticed a copious amount of serosanguineous drainage.  We placed a self-retaining retractor into the peritoneal cavity, placed laparotomy pads, packed small and large bowel in the upper abdomen.  We could see that she had a hysterectomy.  Her right tube and ovary appeared normal.  Left, she had a very necrotic black and blue left tube and ovary that was markedly swollen with a cyst that had drained.  The tube was completely necrotic.  We could see how it had twisted on its infundibulopelvic ligament.  We elevated the necrotic tissue with the Babcock, placed a curved Heaney clamp at the base and the IP ligament with careful attention to avoid the ureter. The specimen was removed and the pedicle was secured with a suture ligature of 0 Vicryl suture in a free tie of 0 Vicryl suture.  Likewise on the right side, we elevated the right tube and ovary, placed a  curved Heaney clamp at the base, careful attention to avoid the ureter and remove the right tube and ovary and the pedicle was secured using a free tie of 0 Vicryl suture and a suture ligature of 0 Vicryl suture. Hemostasis was excellent.  Irrigation was performed.  All sponges and instruments removed from the peritoneal cavity.  The peritoneum was closed using 0 Vicryl in a running stitch.  The rectus muscles were reapproximated using 0 Vicryl. The fascia was closed using 0 Vicryl in a running stitch.  A plain gut was used to place interrupted in the subcu for closure and the skin was closed with a 4-0 Vicryl on a Keith needle.  Dermabond was applied.  All sponge, lap, and instrument counts were correct x2.  The patient went to recovery room in stable condition.     Melana Hingle L. Vincente Poli, M.D.     Florestine Avers  D:  05/12/2012  T:  05/13/2012  Job:  [id number]

## 2012-05-13 NOTE — Addendum Note (Signed)
Addendum  created 05/13/12 1610 by Shanon Payor, CRNA   Modules edited:Notes Section

## 2012-05-13 NOTE — Discharge Summary (Signed)
Physician Discharge Summary  Patient ID: Amy Lang MRN: [id number] DOB/AGE: 07/14/1965 46 y.o.  Admit date: 05/12/2012 Discharge date: 05/13/2012  Admission Diagnoses:  Ovarian torsion, pelvic pain  Discharge Diagnoses: same   Discharged Condition: improved  Hospital Course: Pt was admitted to the hospital for surgical mngt of ovarian torsion, please see op note.  She was admitted postoperatively where she remained afebrile and stable.  Pain was initially controlled with IV pain meds but changed to PO once she was tolerating a regular diet.  POD 1 she was ambulating and voiding without difficulty.  Pt felt to be stable for discgharge.    Consults: None  Significant Diagnostic Studies: labs: CBC  Treatments: IV hydration, surgery: laparotomy, BSO   Discharge Exam: Blood pressure 119/76, pulse 73, temperature 97.6 F (36.4 C), temperature source Oral, resp. rate 17, height 5' (1.524 m), weight 85.367 kg (188 lb 3.2 oz), SpO2 100.00%. General appearance: alert and cooperative GI: soft, non-tender; bowel sounds normal; no masses,  no organomegaly Incision/Wound: clean and intact  Disposition: 01-Home or Self Care     Medication List     As of 05/13/2012  8:23 AM    STOP taking these medications         promethazine 25 MG tablet   Commonly known as: PHENERGAN      promethazine 50 MG suppository   Commonly known as: PHENERGAN      TAKE these medications         aspirin EC 81 MG tablet   Take 81 mg by mouth every evening.      cetirizine 10 MG tablet   Commonly known as: ZYRTEC   Take 10 mg by mouth daily.      eletriptan 40 MG tablet   Commonly known as: RELPAX   One tablet by mouth at onset of headache. May repeat in 2 hours if headache persists or recurs. For migraine. Take 1 tablet, may repeat in 2 hours if necessary      ibuprofen 600 MG tablet   Commonly known as: ADVIL,MOTRIN   Take 1 tablet (600 mg total) by mouth every 6 (six) hours as needed (mild  pain).      multivitamin with minerals Tabs   Take 1 tablet by mouth daily.      ondansetron 8 MG disintegrating tablet   Commonly known as: ZOFRAN-ODT   Take 1 tablet (8 mg total) by mouth every 8 (eight) hours as needed for nausea.      oxyCODONE-acetaminophen 5-325 MG per tablet   Commonly known as: PERCOCET/ROXICET   Take 1-2 tablets by mouth every 6 (six) hours as needed for pain.      topiramate 200 MG tablet   Commonly known as: TOPAMAX   Take 1 tablet (200 mg total) by mouth at bedtime.      venlafaxine XR 150 MG 24 hr capsule   Commonly known as: EFFEXOR-XR   Take 1 capsule (150 mg total) by mouth 2 (two) times daily.      Vitamin B-12 1000 MCG Subl   Place 1 tablet (1,000 mcg total) under the tongue daily.      vitamin C 500 MG tablet   Commonly known as: ASCORBIC ACID   Take 500 mg by mouth daily.           Follow-up Information    Schedule an appointment as soon as possible for a visit in 2 weeks to follow up. (with dr neal or grewal)  Signed: Lewie Deman 05/13/2012, 8:23 AM

## 2012-07-26 ENCOUNTER — Other Ambulatory Visit: Payer: Self-pay | Admitting: Dermatology

## 2012-07-27 HISTORY — PX: TRANSTHORACIC ECHOCARDIOGRAM: SHX275

## 2012-08-14 ENCOUNTER — Other Ambulatory Visit: Payer: Self-pay | Admitting: *Deleted

## 2012-08-14 MED ORDER — VENLAFAXINE HCL ER 150 MG PO CP24: 150.0000 mg | ORAL_CAPSULE | Freq: Two times a day (BID) | ORAL | Status: DC

## 2012-08-14 MED ORDER — TOPIRAMATE 200 MG PO TABS: 200.0000 mg | ORAL_TABLET | Freq: Every day | ORAL | Status: DC

## 2012-08-20 ENCOUNTER — Other Ambulatory Visit (HOSPITAL_COMMUNITY): Payer: Self-pay | Admitting: Cardiovascular Disease

## 2012-08-20 DIAGNOSIS — R002 Palpitations: Secondary | ICD-10-CM

## 2012-08-20 DIAGNOSIS — R42 Dizziness and giddiness: Secondary | ICD-10-CM

## 2012-08-23 ENCOUNTER — Ambulatory Visit (HOSPITAL_COMMUNITY)
Admission: RE | Admit: 2012-08-23 | Discharge: 2012-08-23 | Disposition: A | Discharge status: Home / Self Care | Payer: BC Managed Care – PPO | Source: Ambulatory Visit | Attending: Cardiovascular Disease | Admitting: Cardiovascular Disease

## 2012-08-23 DIAGNOSIS — R42 Dizziness and giddiness: Secondary | ICD-10-CM

## 2012-08-23 DIAGNOSIS — R002 Palpitations: Secondary | ICD-10-CM

## 2012-08-23 NOTE — Progress Notes (Signed)
[facility]   2D echo completed 08/23/2012.   Veda Canning, RDCS

## 2012-09-06 ENCOUNTER — Encounter: Payer: Self-pay | Admitting: Internal Medicine

## 2012-09-06 ENCOUNTER — Ambulatory Visit (INDEPENDENT_AMBULATORY_CARE_PROVIDER_SITE_OTHER): Payer: BC Managed Care – PPO | Admitting: Internal Medicine

## 2012-09-06 VITALS — BP 140/80 | HR 88 | Temp 98.8°F | Resp 16 | Wt 183.0 lb

## 2012-09-06 DIAGNOSIS — E538 Deficiency of other specified B group vitamins: Secondary | ICD-10-CM

## 2012-09-06 DIAGNOSIS — J019 Acute sinusitis, unspecified: Secondary | ICD-10-CM

## 2012-09-06 MED ORDER — DOXYCYCLINE HYCLATE 100 MG PO TABS: 100.0000 mg | ORAL_TABLET | Freq: Two times a day (BID) | ORAL | Status: DC

## 2012-09-06 MED ORDER — FLUCONAZOLE 150 MG PO TABS: 150.0000 mg | ORAL_TABLET | Freq: Once | ORAL | Status: DC

## 2012-09-06 MED ORDER — FLUTICASONE PROPIONATE 50 MCG/ACT NA SUSP: 2.0000 | Freq: Every day | NASAL | Status: DC

## 2012-09-06 NOTE — Progress Notes (Signed)
Subjective:     Sinusitis This is a recurrent problem. The current episode started more than 1 month ago (since Jan 2014 x4 episodes). The problem has been waxing and waning since onset. The maximum temperature recorded prior to her arrival was 100 - 100.9 F. Her pain is at a severity of 6/10. The pain is moderate. Associated symptoms include chills, congestion, ear pain, headaches, sinus pressure, a sore throat and swollen glands. Pertinent negatives include no coughing or sneezing. Past treatments include antibiotics, oral decongestants and spray decongestants. The treatment provided mild relief.  Cough This is a recurrent problem. The cough is productive of purulent sputum. Associated symptoms include chills, ear pain, headaches and a sore throat. Pertinent negatives include no rash.    The patient is here for a wellness exam. F/u dyslipidemia (off statins per Dr Alanda Amass), migraines The patient presents for a follow-up of  Chronic migraines, allergies, depression with medicines. C/o hot flashes, wt gain, fatigue.  Wt Readings from Last 3 Encounters:  09/06/12 183 lb (83.008 kg)  05/12/12 188 lb 3.2 oz (85.367 kg)  05/12/12 188 lb 3.2 oz (85.367 kg)   BP Readings from Last 3 Encounters:  09/06/12 140/80  05/13/12 132/70  05/12/12 154/87       Review of Systems  Constitutional: Positive for chills. Negative for activity change, appetite change, fatigue and unexpected weight change.  HENT: Positive for ear pain, congestion, sore throat and sinus pressure. Negative for sneezing and mouth sores.   Eyes: Negative for visual disturbance.  Respiratory: Negative for cough and chest tightness.   Gastrointestinal: Negative for nausea and abdominal pain.  Genitourinary: Negative for frequency, difficulty urinating and vaginal pain.  Musculoskeletal: Negative for back pain and gait problem.  Skin: Negative for pallor and rash.  Neurological: Positive for headaches. Negative for  dizziness, tremors, weakness and numbness.  Psychiatric/Behavioral: Negative for confusion and sleep disturbance.       Objective:   Physical Exam  Constitutional: She appears well-developed. No distress.  HENT:  Head: Normocephalic.  Right Ear: External ear normal.  Left Ear: External ear normal.  Nose: Nose normal.  Mouth/Throat: Oropharynx is clear and moist.  Eyes: Conjunctivae are normal. Pupils are equal, round, and reactive to light. Right eye exhibits no discharge. Left eye exhibits no discharge.  Neck: Normal range of motion. Neck supple. No JVD present. No tracheal deviation present. No thyromegaly present.  Cardiovascular: Normal rate, regular rhythm and normal heart sounds.   Pulmonary/Chest: No stridor. No respiratory distress. She has no wheezes.  Abdominal: Soft. Bowel sounds are normal. She exhibits no distension and no mass. There is no tenderness. There is no rebound and no guarding.  Musculoskeletal: She exhibits no edema and no tenderness.  Lymphadenopathy:    She has no cervical adenopathy.  Neurological: She displays normal reflexes. No cranial nerve deficit. She exhibits normal muscle tone. Coordination normal.  Skin: No rash noted. No erythema.  Psychiatric: She has a normal mood and affect. Her behavior is normal. Judgment and thought content normal.     Lab Results  Component Value Date   WBC 12.5* 05/13/2012   HGB 12.3 05/13/2012   HCT 36.0 05/13/2012   PLT 205 05/13/2012   GLUCOSE 177* 05/13/2012   CHOL 224* 04/15/2012   TRIG 394.0* 04/15/2012   HDL 49.20 04/15/2012   LDLDIRECT 112.9 04/15/2012   LDLCALC 107* 07/09/2009   ALT 48* 04/15/2012   AST 38* 04/15/2012   NA 136 05/13/2012   K 3.8  05/13/2012   CL 106 05/13/2012   CREATININE 0.61 05/13/2012   BUN 4* 05/13/2012   CO2 21 05/13/2012   TSH 1.02 04/15/2012        Assessment & Plan:

## 2012-09-12 NOTE — Assessment & Plan Note (Signed)
Continue with current prescription therapy as reflected on the Med list.  

## 2012-09-12 NOTE — Assessment & Plan Note (Signed)
See meds 

## 2012-11-12 ENCOUNTER — Other Ambulatory Visit: Payer: Self-pay | Admitting: Internal Medicine

## 2012-11-14 ENCOUNTER — Telehealth: Payer: Self-pay | Admitting: *Deleted

## 2012-11-14 NOTE — Telephone Encounter (Signed)
[facility]ed [facility] to start PA for venlafaxine 150 mg 1cap 2 x daily. PA form is being faxed over. Case # [id number]

## 2012-11-14 NOTE — Telephone Encounter (Signed)
Received PA form

## 2012-12-20 ENCOUNTER — Other Ambulatory Visit: Payer: Self-pay

## 2012-12-20 NOTE — Telephone Encounter (Signed)
Received pharmacy rejection stating that insurance will not cover relpax without a prior authorization. Must call [facility] at [phone], ID [id number].

## 2012-12-23 ENCOUNTER — Ambulatory Visit (INDEPENDENT_AMBULATORY_CARE_PROVIDER_SITE_OTHER): Payer: BC Managed Care – PPO | Admitting: Internal Medicine

## 2012-12-23 ENCOUNTER — Encounter: Payer: Self-pay | Admitting: Internal Medicine

## 2012-12-23 ENCOUNTER — Other Ambulatory Visit (INDEPENDENT_AMBULATORY_CARE_PROVIDER_SITE_OTHER): Payer: BC Managed Care – PPO

## 2012-12-23 VITALS — BP 140/88 | HR 82 | Temp 98.7°F | Resp 16 | Wt 185.0 lb

## 2012-12-23 DIAGNOSIS — K921 Melena: Secondary | ICD-10-CM

## 2012-12-23 DIAGNOSIS — R197 Diarrhea, unspecified: Secondary | ICD-10-CM

## 2012-12-23 DIAGNOSIS — E669 Obesity, unspecified: Secondary | ICD-10-CM

## 2012-12-23 DIAGNOSIS — G43909 Migraine, unspecified, not intractable, without status migrainosus: Secondary | ICD-10-CM

## 2012-12-23 LAB — CBC WITH DIFFERENTIAL/PLATELET
Basophils Absolute: 0 10*3/uL (ref 0.0–0.1)
Basophils Relative: 0.4 % (ref 0.0–3.0)
Eosinophils Absolute: 0.1 10*3/uL (ref 0.0–0.7)
Eosinophils Relative: 1.8 % (ref 0.0–5.0)
HCT: 38.9 % (ref 36.0–46.0)
Hemoglobin: 13.3 g/dL (ref 12.0–15.0)
Lymphocytes Relative: 31.9 % (ref 12.0–46.0)
Lymphs Abs: 1.9 10*3/uL (ref 0.7–4.0)
MCHC: 34.1 g/dL (ref 30.0–36.0)
MCV: 88.4 fl (ref 78.0–100.0)
Monocytes Absolute: 0.4 10*3/uL (ref 0.1–1.0)
Monocytes Relative: 6.3 % (ref 3.0–12.0)
Neutro Abs: 3.5 10*3/uL (ref 1.4–7.7)
Neutrophils Relative %: 59.6 % (ref 43.0–77.0)
Platelets: 200 10*3/uL (ref 150.0–400.0)
RBC: 4.4 Mil/uL (ref 3.87–5.11)
RDW: 12.9 % (ref 11.5–14.6)
WBC: 5.8 10*3/uL (ref 4.5–10.5)

## 2012-12-23 LAB — BASIC METABOLIC PANEL
BUN: 12 mg/dL (ref 6–23)
CO2: 23 mEq/L (ref 19–32)
Calcium: 9 mg/dL (ref 8.4–10.5)
Chloride: 107 mEq/L (ref 96–112)
Creatinine, Ser: 0.7 mg/dL (ref 0.4–1.2)
GFR: 96.04 mL/min (ref >60.00)
Glucose, Bld: 102 mg/dL — ABNORMAL HIGH (ref 70–99)
Potassium: 4 mEq/L (ref 3.5–5.1)
Sodium: 138 mEq/L (ref 135–145)

## 2012-12-23 LAB — HEPATIC FUNCTION PANEL
ALT: 37 U/L — ABNORMAL HIGH (ref 0–35)
AST: 29 U/L (ref 0–37)
Albumin: 4.1 g/dL (ref 3.5–5.2)
Alkaline Phosphatase: 74 U/L (ref 39–117)
Bilirubin, Direct: 0.1 mg/dL (ref 0.0–0.3)
Total Bilirubin: 0.6 mg/dL (ref 0.3–1.2)
Total Protein: 7.4 g/dL (ref 6.0–8.3)

## 2012-12-23 LAB — SEDIMENTATION RATE: Sed Rate: 13 mm/hr (ref 0–22)

## 2012-12-23 MED ORDER — COLESEVELAM HCL 625 MG PO TABS: 1875.0000 mg | ORAL_TABLET | Freq: Two times a day (BID) | ORAL | Status: DC

## 2012-12-23 MED ORDER — ELETRIPTAN HYDROBROMIDE 40 MG PO TABS: 40.0000 mg | ORAL_TABLET | Freq: Two times a day (BID) | ORAL | Status: DC | PRN

## 2012-12-23 MED ORDER — ALIGN 4 MG PO CAPS: 1.0000 | ORAL_CAPSULE | Freq: Every day | ORAL | Status: DC

## 2012-12-23 MED ORDER — METRONIDAZOLE 500 MG PO TABS: 500.0000 mg | ORAL_TABLET | Freq: Three times a day (TID) | ORAL | Status: DC

## 2012-12-23 MED ORDER — DIPHENOXYLATE-ATROPINE 2.5-0.025 MG PO TABS: 1.0000 | ORAL_TABLET | Freq: Four times a day (QID) | ORAL | Status: DC | PRN

## 2012-12-23 NOTE — Progress Notes (Signed)
Subjective:    Diarrhea  This is a new problem. The current episode started 1 to 4 weeks ago (4 wks). The problem occurs 5 to 10 times per day. The problem has been unchanged. The stool consistency is described as bloody. The patient states that diarrhea awakens her from sleep. Associated symptoms include arthralgias, bloating and myalgias. Pertinent negatives include no abdominal pain, chills, coughing, headaches, vomiting or weight loss. Exacerbated by: eating. She has tried nothing for the symptoms. Her past medical history is significant for a recent abdominal surgery (cholecystectomy a couple  weeks ago). There is no history of inflammatory bowel disease.  Rectal Bleeding  The current episode started more than 2 weeks ago. The problem occurs occasionally. The problem has been unchanged. The patient is experiencing no pain. The stool is described as streaked with blood and soft. Associated symptoms include diarrhea. Pertinent negatives include no abdominal pain, no nausea, no vomiting, no headaches, no coughing and no rash. Her past medical history does not include inflammatory bowel disease.    She took 3 abx in may for sinusitis    Wt Readings from Last 3 Encounters:  12/23/12 185 lb (83.915 kg)  09/06/12 183 lb (83.008 kg)  05/12/12 188 lb 3.2 oz (85.367 kg)   BP Readings from Last 3 Encounters:  12/23/12 140/88  09/06/12 140/80  05/13/12 132/70       Review of Systems  Constitutional: Negative for chills, weight loss, activity change, appetite change, fatigue and unexpected weight change.  HENT: Negative for congestion, mouth sores and sinus pressure.   Eyes: Negative for visual disturbance.  Respiratory: Negative for cough and chest tightness.   Gastrointestinal: Positive for diarrhea, hematochezia and bloating. Negative for nausea, vomiting and abdominal pain.  Genitourinary: Negative for frequency, difficulty urinating and vaginal pain.  Musculoskeletal: Positive for  myalgias and arthralgias. Negative for back pain and gait problem.  Skin: Negative for pallor and rash.  Neurological: Negative for dizziness, tremors, weakness, numbness and headaches.  Psychiatric/Behavioral: Negative for confusion and sleep disturbance.       Objective:   Physical Exam  Constitutional: She appears well-developed. No distress.  HENT:  Head: Normocephalic.  Right Ear: External ear normal.  Left Ear: External ear normal.  Nose: Nose normal.  Mouth/Throat: Oropharynx is clear and moist.  Eyes: Conjunctivae are normal. Pupils are equal, round, and reactive to light. Right eye exhibits no discharge. Left eye exhibits no discharge.  Neck: Normal range of motion. Neck supple. No JVD present. No tracheal deviation present. No thyromegaly present.  Cardiovascular: Normal rate, regular rhythm and normal heart sounds.   Pulmonary/Chest: No stridor. No respiratory distress. She has no wheezes.  Abdominal: Soft. Bowel sounds are normal. She exhibits no distension and no mass. There is no tenderness. There is no rebound and no guarding.  Musculoskeletal: She exhibits no edema and no tenderness.  Lymphadenopathy:    She has no cervical adenopathy.  Neurological: She displays normal reflexes. No cranial nerve deficit. She exhibits normal muscle tone. Coordination normal.  Skin: No rash noted. No erythema.  Psychiatric: She has a normal mood and affect. Her behavior is normal. Judgment and thought content normal.     Lab Results  Component Value Date   WBC 12.5* 05/13/2012   HGB 12.3 05/13/2012   HCT 36.0 05/13/2012   PLT 205 05/13/2012   GLUCOSE 177* 05/13/2012   CHOL 224* 04/15/2012   TRIG 394.0* 04/15/2012   HDL 49.20 04/15/2012   LDLDIRECT 112.9 04/15/2012  LDLCALC 107* 07/09/2009   ALT 48* 04/15/2012   AST 38* 04/15/2012   NA 136 05/13/2012   K 3.8 05/13/2012   CL 106 05/13/2012   CREATININE 0.61 05/13/2012   BUN 4* 05/13/2012   CO2 21 05/13/2012   TSH 1.02  04/15/2012        Assessment & Plan:

## 2012-12-23 NOTE — Assessment & Plan Note (Addendum)
7/14 x4wks GI ref Dr Marina Goodell See Meds: Lomotil, Welchol, Align; empiric Flagyl Labs incl C diff stool Gluten free trial (no wheat products) x4-6 weeks. OK to use Gluten-free bread and pasta. Milk free trial (no milk, ice cream and yogurt) x4 weeks. OK to use almond or soy milk.

## 2012-12-23 NOTE — Patient Instructions (Signed)
Gluten free trial (no wheat products) x4-6 weeks. OK to use Gluten-free bread and pasta. Milk free trial (no milk, ice cream and yogurt) x4 weeks. OK to use almond or soy milk. 

## 2012-12-23 NOTE — Assessment & Plan Note (Signed)
Continue with current prescription therapy as reflected on the Med list. Imitrex, Midrin doesn't work

## 2012-12-23 NOTE — Assessment & Plan Note (Signed)
Wt Readings from Last 3 Encounters:  12/23/12 185 lb (83.915 kg)  09/06/12 183 lb (83.008 kg)  05/12/12 188 lb 3.2 oz (85.367 kg)

## 2012-12-23 NOTE — Assessment & Plan Note (Addendum)
7/14 GI ref Dr Marina Goodell

## 2012-12-24 ENCOUNTER — Encounter: Payer: Self-pay | Admitting: Internal Medicine

## 2012-12-24 ENCOUNTER — Other Ambulatory Visit: Payer: BC Managed Care – PPO

## 2012-12-24 ENCOUNTER — Encounter: Payer: Self-pay | Admitting: *Deleted

## 2012-12-24 DIAGNOSIS — K921 Melena: Secondary | ICD-10-CM

## 2012-12-24 DIAGNOSIS — R197 Diarrhea, unspecified: Secondary | ICD-10-CM

## 2012-12-25 ENCOUNTER — Telehealth: Payer: Self-pay | Admitting: *Deleted

## 2012-12-25 LAB — GIARDIA/CRYPTOSPORIDIUM (EIA): Cryptosporidium Screen (EIA): NEGATIVE

## 2012-12-25 NOTE — Telephone Encounter (Signed)
Relpax PA is approved. [facility] informed.

## 2012-12-26 ENCOUNTER — Encounter: Payer: Self-pay | Admitting: *Deleted

## 2012-12-27 ENCOUNTER — Other Ambulatory Visit: Payer: Self-pay | Admitting: Dermatology

## 2012-12-27 ENCOUNTER — Telehealth: Payer: Self-pay | Admitting: *Deleted

## 2012-12-27 MED ORDER — ELETRIPTAN HYDROBROMIDE 40 MG PO TABS: 40.0000 mg | ORAL_TABLET | Freq: Two times a day (BID) | ORAL | Status: DC | PRN

## 2012-12-27 NOTE — Telephone Encounter (Signed)
Prior Auth for Relpax 40mg  initiated. Insurance will cover 9 tablets/30 days.  rx sent to pharmacy.

## 2013-01-06 ENCOUNTER — Other Ambulatory Visit: Payer: Self-pay | Admitting: Obstetrics and Gynecology

## 2013-01-22 ENCOUNTER — Encounter: Payer: Self-pay | Admitting: Internal Medicine

## 2013-01-22 ENCOUNTER — Ambulatory Visit (INDEPENDENT_AMBULATORY_CARE_PROVIDER_SITE_OTHER): Payer: BC Managed Care – PPO | Admitting: Internal Medicine

## 2013-01-22 VITALS — BP 110/80 | HR 72 | Ht 61.0 in | Wt 183.5 lb

## 2013-01-22 DIAGNOSIS — K625 Hemorrhage of anus and rectum: Secondary | ICD-10-CM

## 2013-01-22 DIAGNOSIS — E538 Deficiency of other specified B group vitamins: Secondary | ICD-10-CM

## 2013-01-22 DIAGNOSIS — R197 Diarrhea, unspecified: Secondary | ICD-10-CM

## 2013-01-22 DIAGNOSIS — K529 Noninfective gastroenteritis and colitis, unspecified: Secondary | ICD-10-CM

## 2013-01-22 MED ORDER — CYANOCOBALAMIN 1000 MCG/ML IJ SOLN
1000.0000 ug | Freq: Once | INTRAMUSCULAR | Status: AC
  Administered 2013-01-22: 1000 ug via INTRAMUSCULAR

## 2013-01-22 MED ORDER — NA SULFATE-K SULFATE-MG SULF 17.5-3.13-1.6 GM/177ML PO SOLN: ORAL | Status: DC

## 2013-01-22 NOTE — Patient Instructions (Addendum)
You have been scheduled for a colonoscopy with propofol. Please follow written instructions given to you at your visit today.  Please use the suprep sample kit we have given you today. If you use inhalers (even only as needed), please bring them with you on the day of your procedure. Your physician has requested that you go to www.startemmi.com and enter the access code given to you at your visit today. This web site gives a general overview about your procedure. However, you should still follow specific instructions given to you by our office regarding your preparation for the procedure.  Today we are giving you a B-12 injection.  I appreciate the opportunity to care for you.

## 2013-01-22 NOTE — Progress Notes (Signed)
  Subjective:  Referred by: Sonda Primes, MD   Patient ID: Amy Lang, female    DOB: 1965-09-26, 47 y.o.   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 She has noticed urgent post-prandial defecation and some abdominal cramps. Over the past 2-3 months this has intensified. She has had urge fecal incontinence. No urinary incontinence. Also now seeing blood and mucous in the stools. Stool studies including parasite screen, C diff toxin have been negative. She did have multiple rounds of antibiotics for sinus infections early in 2014. She also describes nausea, anorexia and gaseousnous. She has to keep changes of clothes avaialble and is concerned that when she passes flatus there may be loose stool with it.  Medications, allergies, past medical history, past surgical history, family history and social history are reviewed and updated in the EMR.  Review of Systems As per HPI + night sweats, no fevers. All other ROS negative.    Objective:   Physical Exam General:  Well-developed, well-nourished and in no acute distress Eyes:  anicteric. ENT:   Mouth and posterior pharynx free of lesions.  Neck:   supple w/o thyromegaly or mass.  Lungs: Clear to auscultation bilaterally. Heart:  S1S2, no rubs, murmurs, gallops. Abdomen:  0bese, softsoft, non-tender, no hepatosplenomegaly, hernia, or mass and BS+.  Rectal: Inspection - female staff present - no dermatitis - anterior anal tag small Lymph:  no cervical or supraclavicular adenopathy. Extremities:   no edema Skin   no rash. Neuro:  A&O x 3.  Psych:  appropriate mood and  Affect.   Data Reviewed: Stool studies Lab Results  Component Value Date   WBC 5.8 12/23/2012   HGB 13.3 12/23/2012   HCT 38.9 12/23/2012   MCV 88.4 12/23/2012   PLT 200.0 12/23/2012   Lab Results  Component Value Date   TSH 1.02 04/15/2012     Chemistry       Component Value Date/Time   NA 138 12/23/2012 1130   K 4.0 12/23/2012 1130   CL 107 12/23/2012 1130   CO2 23 12/23/2012 1130   BUN 12 12/23/2012 1130   CREATININE 0.7 12/23/2012 1130      Component Value Date/Time   CALCIUM 9.0 12/23/2012 1130   ALKPHOS 74 12/23/2012 1130   AST 29 12/23/2012 1130   ALT 37* 12/23/2012 1130   BILITOT 0.6 12/23/2012 1130          Assessment & Plan:  Chronic diarrhea with urge incontinence  Rectal bleeding  B12 deficiency  1. Colonoscopy to investigate ? IBD, microscopic colitis, bleeding hemorrhoids, less likely neoplasia The risks and benefits as well as alternatives of endoscopic procedure(s) have been discussed and reviewed. All questions answered. The patient agrees to proceed. Calmoseptine sample if available Possible eventual hemorrhoid ligation Could possibly have celiac dz w/low B12 though also terminal ileal IBD can do that - B12 injection x 1 today  I appreciate the opportunity to care for this patient.  WU:JWJX Plotnikov, MD

## 2013-01-24 ENCOUNTER — Ambulatory Visit (AMBULATORY_SURGERY_CENTER): Payer: BC Managed Care – PPO | Admitting: Internal Medicine

## 2013-01-24 ENCOUNTER — Encounter: Payer: Self-pay | Admitting: Internal Medicine

## 2013-01-24 VITALS — BP 129/69 | HR 67 | Temp 97.9°F | Resp 16 | Ht 61.0 in | Wt 183.0 lb

## 2013-01-24 DIAGNOSIS — D126 Benign neoplasm of colon, unspecified: Secondary | ICD-10-CM

## 2013-01-24 DIAGNOSIS — R197 Diarrhea, unspecified: Secondary | ICD-10-CM

## 2013-01-24 HISTORY — PX: COLONOSCOPY W/ BIOPSIES: SHX1374

## 2013-01-24 MED ORDER — SODIUM CHLORIDE 0.9 % IV SOLN: 500.0000 mL | INTRAVENOUS | Status: DC

## 2013-01-24 NOTE — Op Note (Signed)
[facility] [address] [place], [address]   COLONOSCOPY PROCEDURE REPORT  PATIENT: Amy Lang, Amy Lang  MR#: [id number] BIRTHDATE: 07/15/1965 , 47  yrs. old GENDER: Female ENDOSCOPIST: Iva Boop, MD, [facility] PROCEDURE DATE:  01/24/2013 PROCEDURE:   Colonoscopy with biopsy First Screening Colonoscopy - Avg.  risk and is 50 yrs.  old or older - No.  Prior Negative Screening - Now for repeat screening. N/A ASA CLASS:   Class II INDICATIONS:chronic diarrhea. MEDICATIONS: propofol (Diprivan) 300mg  IV, MAC sedation, administered by CRNA, and These medications were titrated to patient response per physician's verbal order  DESCRIPTION OF PROCEDURE:   After the risks benefits and alternatives of the procedure were thoroughly explained, informed consent was obtained.  A digital rectal exam revealed no abnormalities of the rectum.   The LB [id number]-[id number]  endoscope was introduced through the anus and advanced to the terminal ileum which was intubated for a short distance. No adverse events experienced.   The quality of the prep was excellent using Suprep The instrument was then slowly withdrawn as the colon was fully examined.      COLON FINDINGS: The mucosa appeared normal in the terminal ileum. The colonic mucosa appeared normal throughout the entire examined colon.  Multiple random biopsies of the area were performed.   A right colon retroflexion was performed.  Retroflexed views revealed no abnormalities. The time to cecum=1 minutes 09 seconds. Withdrawal time=7 minutes 33 seconds.  The scope was withdrawn and the procedure completed. COMPLICATIONS: There were no complications.  ENDOSCOPIC IMPRESSION: 1.   Normal mucosa in the terminal ileum 2.   The colonic mucosa appeared normal throughout the entire examined colon; multiple random biopsies of the area were performed   RECOMMENDATIONS: 1.  Office will call with the results. 2.   I suspect she has  IBS and would benefit from Lotronex but await biopsies to see if she has microscopic colitis. 3.   Repeat colonoscopy in 10 years (routine)   eSigned:  Iva Boop, MD, [facility] 01/24/2013 2:36 PM   cc: Linda Hedges. Plotnikov, MD and The Patient

## 2013-01-24 NOTE — Progress Notes (Signed)
Patient did not have preoperative order for IV antibiotic SSI prophylaxis. (G8918)  Patient did not experience any of the following events: a burn prior to discharge; a fall within the facility; wrong site/side/patient/procedure/implant event; or a hospital transfer or hospital admission upon discharge from the facility. (G8907)  

## 2013-01-24 NOTE — Patient Instructions (Addendum)
YOU HAD AN ENDOSCOPIC PROCEDURE TODAY AT THE [facility]: Refer to the procedure report that was given to you for any specific questions about what was found during the examination.  If the procedure report does not answer your questions, please call your gastroenterologist to clarify.  If you requested that your care partner not be given the details of your procedure findings, then the procedure report has been included in a sealed envelope for you to review at your convenience later.  YOU SHOULD EXPECT: Some feelings of bloating in the abdomen. Passage of more gas than usual.  Walking can help get rid of the air that was put into your GI tract during the procedure and reduce the bloating. If you had a lower endoscopy (such as a colonoscopy or flexible sigmoidoscopy) you may notice spotting of blood in your stool or on the toilet paper. If you underwent a bowel prep for your procedure, then you may not have a normal bowel movement for a few days.  DIET: Your first meal following the procedure should be a light meal and then it is ok to progress to your normal diet.  A half-sandwich or bowl of soup is an example of a good first meal.  Heavy or fried foods are harder to digest and may make you feel nauseous or bloated.  Likewise meals heavy in dairy and vegetables can cause extra gas to form and this can also increase the bloating.  Drink plenty of fluids but you should avoid alcoholic beverages for 24 hours.  ACTIVITY: Your care partner should take you home directly after the procedure.  You should plan to take it easy, moving slowly for the rest of the day.  You can resume normal activity the day after the procedure however you should NOT DRIVE or use heavy machinery for 24 hours (because of the sedation medicines used during the test).    SYMPTOMS TO REPORT IMMEDIATELY: A gastroenterologist can be reached at any hour.  During normal business hours, 8:30 AM to 5:00 PM Monday through Friday,  call [phone].  After hours and on weekends, please call the GI answering service at [phone] who will take a message and have the physician on call contact you.   Following lower endoscopy (colonoscopy or flexible sigmoidoscopy):  Excessive amounts of blood in the stool  Significant tenderness or worsening of abdominal pains  Swelling of the abdomen that is new, acute  Fever of 100F or higher  FOLLOW UP: If any biopsies were taken you will be contacted by phone or by letter within the next 1-3 weeks.  Call your gastroenterologist if you have not heard about the biopsies in 3 weeks.  Our staff will call the home number listed on your records the next business day following your procedure to check on you and address any questions or concerns that you may have at that time regarding the information given to you following your procedure. This is a courtesy call and so if there is no answer at the home number and we have not heard from you through the emergency physician on call, we will assume that you have returned to your regular daily activities without incident.  SIGNATURES/CONFIDENTIALITY: You and/or your care partner have signed paperwork which will be entered into your electronic medical record.  These signatures attest to the fact that that the information above on your After Visit Summary has been reviewed and is understood.  Full responsibility of the confidentiality of this   discharge information lies with you and/or your care-partner.   The colonoscopy was normal. I was able to see the ileum (end of small intestine) and that looks ok, too. I did take biopsies to see if there is microscopic inflammation present. Once I know those results (next week) will call you about that. I also saw some anal skin tag changes - up inside the anal canal.   I think you have Irritable Bowel Syndrome and diarrhea from that. You are probably a good candidate to be treated with a drug called  Lotronex. You can look that up on line - and read about it in anticipation of me prescribing that.   I appreciate the opportunity to care for you. Iva Boop, MD, [facility]

## 2013-01-24 NOTE — Progress Notes (Signed)
Called to room to assist during endoscopic procedure.  Patient ID and intended procedure confirmed with present staff. Received instructions for my participation in the procedure from the performing physician.  

## 2013-01-24 NOTE — Progress Notes (Addendum)
No egg or soy allergy.ewm Pt states she always has post op N/V. j mullins CRNA notified of this. ewm

## 2013-01-28 ENCOUNTER — Telehealth: Payer: Self-pay | Admitting: *Deleted

## 2013-01-28 NOTE — Telephone Encounter (Signed)
  Follow up Call-  Call back number 01/24/2013  Post procedure Call Back phone  # [phone]  Permission to leave phone message Yes     Patient questions:  Do you have a fever, pain , or abdominal swelling? no Pain Score  0 *  Have you tolerated food without any problems? yes  Have you been able to return to your normal activities? yes  Do you have any questions about your discharge instructions: Diet   no Medications  no Follow up visit  no  Do you have questions or concerns about your Care? no  Actions: * If pain score is 4 or above: No action needed, pain <4.

## 2013-02-04 ENCOUNTER — Encounter: Payer: Self-pay | Admitting: Internal Medicine

## 2013-02-04 NOTE — Progress Notes (Signed)
Quick Note:  Let her know the biopsies show normal colon My diagnosis is IBS  I am recommending she consider Lotronex - could we have her read the info and get an appointment also to discuss?  [facility] - no letter, 10 yr recall colonoscopy ______

## 2013-02-13 ENCOUNTER — Telehealth: Payer: Self-pay | Admitting: Cardiovascular Disease

## 2013-02-13 NOTE — Telephone Encounter (Signed)
Bp has dropped , feeling like she is about out pass out and really dizzy. BP was 121/51 and heart rate is 72. Would like to know if she should stop her bp meds. This has been going for the past 3dys .[name] Please call  Thanks

## 2013-02-13 NOTE — Telephone Encounter (Signed)
Has experienced dizziness @ 3 days.  Had BP checked today 121/51.  She feels this is too low for her but states she also has had vertigo in the past.  Will hold the Losartan 1-2 days and keep an eye on her BP to see if the dizziness resolves.  [place].

## 2013-02-17 ENCOUNTER — Encounter: Payer: Self-pay | Admitting: *Deleted

## 2013-02-17 ENCOUNTER — Encounter: Payer: Self-pay | Admitting: Cardiology

## 2013-02-17 ENCOUNTER — Ambulatory Visit (INDEPENDENT_AMBULATORY_CARE_PROVIDER_SITE_OTHER): Payer: BC Managed Care – PPO | Admitting: Cardiology

## 2013-02-17 VITALS — BP 138/75 | HR 83 | Ht 61.0 in | Wt 185.9 lb

## 2013-02-17 DIAGNOSIS — Z8249 Family history of ischemic heart disease and other diseases of the circulatory system: Secondary | ICD-10-CM

## 2013-02-17 DIAGNOSIS — R42 Dizziness and giddiness: Secondary | ICD-10-CM | POA: Insufficient documentation

## 2013-02-17 DIAGNOSIS — E669 Obesity, unspecified: Secondary | ICD-10-CM | POA: Insufficient documentation

## 2013-02-17 DIAGNOSIS — E785 Hyperlipidemia, unspecified: Secondary | ICD-10-CM

## 2013-02-17 DIAGNOSIS — I10 Essential (primary) hypertension: Secondary | ICD-10-CM

## 2013-02-17 DIAGNOSIS — K589 Irritable bowel syndrome without diarrhea: Secondary | ICD-10-CM | POA: Insufficient documentation

## 2013-02-17 DIAGNOSIS — J309 Allergic rhinitis, unspecified: Secondary | ICD-10-CM

## 2013-02-17 NOTE — Assessment & Plan Note (Signed)
Better once she resumed Zyrtec

## 2013-02-17 NOTE — Progress Notes (Signed)
02/17/2013 Amy Lang   May 02, 1966  [id number]  Primary Physicia Amy Primes, MD Primary Cardiologist: Dr Amy Lang  HPI:  Pleasant 47 y/o whose mother, Amy Lang was a pt of ours. Amy Lang had a Myoview that was low risk in 2011. An echo was recently done May 2014 and was normal. She had seen Dr Amy Lang 08/20/12. Her B/P had been reported to be slightly elevated and he added Cozaar 25 mg, (but for some reason she had been taking 50 mg). She recently had some dizziness and she thought it may be that her B/P was too low. Several B/P readings were normal with systolic pressures in the 120s. She then resumed her Zyrtec and her symptoms resolved. In retrospect she thinks her dizziness was from her chronic seasonal allergies. She denies palpitations or tachycardia.   Current Outpatient Prescriptions  Medication Sig Dispense Refill  . aspirin EC 81 MG tablet Take 81 mg by mouth every evening.       . cetirizine (ZYRTEC) 10 MG tablet Take 10 mg by mouth daily.       . Cyanocobalamin (VITAMIN B-12) 1000 MCG SUBL Place 1 tablet (1,000 mcg total) under the tongue daily.  100 tablet  3  . diphenoxylate-atropine (LOMOTIL) 2.5-0.025 MG per tablet Take 1 tablet by mouth 4 (four) times daily as needed for diarrhea or loose stools.  60 tablet  1  . eletriptan (RELPAX) 40 MG tablet Take 1 tablet (40 mg total) by mouth 2 (two) times daily as needed. For migraine. Take 1 tablet, may repeat in 2 hours if necessary  9 tablet  3  . fluticasone (FLONASE) 50 MCG/ACT nasal spray Place 2 sprays into the nose daily.  16 g  3  . ibuprofen (ADVIL,MOTRIN) 600 MG tablet Take 1 tablet (600 mg total) by mouth every 6 (six) hours as needed (mild pain).  30 tablet  2  . Multiple Vitamin (MULTIVITAMIN WITH MINERALS) TABS Take 1 tablet by mouth daily.      . ondansetron (ZOFRAN ODT) 8 MG disintegrating tablet Take 1 tablet (8 mg total) by mouth every 8 (eight) hours as needed for nausea.  20 tablet  0  . Probiotic  Product (ALIGN) 4 MG CAPS Take 1 capsule by mouth daily.  30 capsule  1  . topiramate (TOPAMAX) 200 MG tablet Take 1 tablet (200 mg total) by mouth at bedtime.  90 tablet  1  . venlafaxine XR (EFFEXOR-XR) 150 MG 24 hr capsule Take 1 capsule (150 mg total) by mouth 2 (two) times daily.  180 capsule  1  . vitamin C (ASCORBIC ACID) 500 MG tablet Take 500 mg by mouth daily.       . colesevelam (WELCHOL) 625 MG tablet Take 3 tablets (1,875 mg total) by mouth 2 (two) times daily with a meal.  120 tablet  3  . LOSARTAN POTASSIUM PO Take 50 mg by mouth daily.        No current facility-administered medications for this visit.    Allergies  Allergen Reactions  . Bee Pollen Anaphylaxis  . Butorphanol Tartrate Other (See Comments)    REACTION: hallucinations ( stadol)  . Codeine Sulfate Hives and Nausea And Vomiting  . Crestor [Rosuvastatin] Other (See Comments)    Body aches  . Darvocet [Propoxyphene-Acetaminophen] Hives and Nausea And Vomiting  . Levofloxacin Other (See Comments)    abdominal pain  . Penicillins Other (See Comments)    Seizure   . Sulfa Antibiotics Hives and Nausea And  Vomiting  . Latex Rash    Blisters and redness with bandaids-also sensitivity to latex gloves    History   Social History  . Marital Status: Married    Spouse Name: N/A    Number of Children: N/A  . Years of Education: N/A   Occupational History  . Not on file.   Social History Main Topics  . Smoking status: Never Smoker   . Smokeless tobacco: Never Used     Comment: Regular Exercise - No  . Alcohol Use: No  . Drug Use: No  . Sexual Activity: Yes     Comment: hysterectomy   Other Topics Concern  . Not on file   Social History Narrative  . No narrative on file     Review of Systems: General: negative for chills, fever, night sweats or weight changes.  Cardiovascular: negative for chest pain, dyspnea on exertion, edema, orthopnea, palpitations, paroxysmal nocturnal dyspnea or shortness of  breath Dermatological: negative for rash Respiratory: negative for cough or wheezing Urologic: negative for hematuria Abdominal: negative for nausea, vomiting, diarrhea, bright red blood per rectum, melena, or hematemesis Neurologic: negative for visual changes, syncope All other systems reviewed and are otherwise negative except as noted above.    Blood pressure 138/75, pulse 83, height 5\' 1"  (1.549 m), weight 185 lb 14.4 oz (84.324 kg).  General appearance: alert, cooperative, no distress and mildly obese Lungs: clear to auscultation bilaterally Heart: regular rate and rhythm  EKG NSR  ASSESSMENT AND PLAN:   Dizziness She noted dizziness a few days ago anf thought it may be her B/P medication  Dyslipidemia Statin intol  Hypertension Borderline, normal today after no B/P meds for several days  IBS (irritable bowel syndrome) Dr Leone Payor follows, doing well  ALLERGIC RHINITIS Better once she resumed Zyrtec  Obesity (BMI 30-39.9) .  Family history of coronary artery disease .    PLAN  Repeat B/P by me was 130/72. We discussed the importance of wgt loss and a low salt diet. We also discussed exercise, she has "bad knees" and walks a couple times a week. 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 Amy Lang see her back in a month and see how her B/P is doing on a better diet and hopefully more regular exercise.  Amy Lang KPA-C 02/17/2013 5:08 PM

## 2013-02-17 NOTE — Assessment & Plan Note (Signed)
Dr Leone Payor follows, doing well

## 2013-02-17 NOTE — Assessment & Plan Note (Signed)
She noted dizziness a few days ago anf thought it may be her B/P medication

## 2013-02-17 NOTE — Patient Instructions (Signed)
Your physician recommends that you schedule a follow-up appointment in: one month with Amy Lang Low salt, low fat diet

## 2013-02-17 NOTE — Assessment & Plan Note (Signed)
Borderline, normal today after no B/P meds for several days

## 2013-02-17 NOTE — Assessment & Plan Note (Signed)
Statin intol 

## 2013-02-20 ENCOUNTER — Telehealth: Payer: Self-pay | Admitting: Cardiology

## 2013-02-20 NOTE — Telephone Encounter (Signed)
LM for pt to call and schedule 4 week follow up appt with [facility].

## 2013-03-07 ENCOUNTER — Ambulatory Visit: Payer: BC Managed Care – PPO | Admitting: Internal Medicine

## 2013-03-18 ENCOUNTER — Other Ambulatory Visit: Payer: Self-pay | Admitting: Internal Medicine

## 2013-04-04 ENCOUNTER — Ambulatory Visit (INDEPENDENT_AMBULATORY_CARE_PROVIDER_SITE_OTHER): Payer: BC Managed Care – PPO | Admitting: Cardiology

## 2013-04-04 VITALS — BP 146/84 | HR 84 | Ht 61.0 in | Wt 181.4 lb

## 2013-04-04 DIAGNOSIS — E785 Hyperlipidemia, unspecified: Secondary | ICD-10-CM

## 2013-04-04 DIAGNOSIS — I10 Essential (primary) hypertension: Secondary | ICD-10-CM

## 2013-04-04 DIAGNOSIS — Z79899 Other long term (current) drug therapy: Secondary | ICD-10-CM

## 2013-04-04 DIAGNOSIS — E782 Mixed hyperlipidemia: Secondary | ICD-10-CM

## 2013-04-04 DIAGNOSIS — R42 Dizziness and giddiness: Secondary | ICD-10-CM

## 2013-04-04 DIAGNOSIS — E669 Obesity, unspecified: Secondary | ICD-10-CM

## 2013-04-04 DIAGNOSIS — Z8249 Family history of ischemic heart disease and other diseases of the circulatory system: Secondary | ICD-10-CM

## 2013-04-04 NOTE — Patient Instructions (Signed)
Labs-CMP LIPID  Your physician wants you to follow-up in 6 MONTH Dr Herbie Baltimore.  You will receive a reminder letter in the mail two months in advance. If you don't receive a letter, please call our office to schedule the follow-up appointment.

## 2013-04-06 ENCOUNTER — Encounter: Payer: Self-pay | Admitting: Cardiology

## 2013-04-06 NOTE — Assessment & Plan Note (Signed)
Statin intolerant. Due for labs to be rechecked.  Plan: Lipid Panel with CMP

## 2013-04-06 NOTE — Assessment & Plan Note (Signed)
Despite her walking, she has been relatively stable with her weight for several years now.  She really needs dietary modification and probably some more vigorous exercise. I think her triglycerides are clearly related to obesity. We talked about reducing her carbohydrate as well as food intake.

## 2013-04-06 NOTE — Assessment & Plan Note (Signed)
Resolved after stopping losartan. Her blood pressure is mildly elevated today. But is still within acceptable range.

## 2013-04-06 NOTE — Assessment & Plan Note (Signed)
Upper limit of normal to mildly abnormal. The given her recent dizziness that was thought to be orthostatic in nature, I think we'll allow her to have mild permissive hypertension.

## 2013-04-06 NOTE — Assessment & Plan Note (Signed)
Negative cardiac evaluation today. Asymptomatic. Continue to monitor and treat risk factors as needed. She is taking aspirin.

## 2013-04-06 NOTE — Progress Notes (Signed)
PATIENT: Amy Lang MRN: [id number]  DOB: 03/19/1966   DOV:04/06/2013 PCP: Amy Primes, MD  Clinic Note: Chief Complaint  Patient presents with  . Follow-up    RAW pt. Denies c/p, SOB, swelling.   HPI: Amy Lang is a 47 y.o. moderately obese Caucasian female with a PMH below who presents today for seven-month followup. She is a former patient of Dr. Alanda Amass who is basically seeing cardiologist due to her strong family history of coronary disease. She has statin intolerance for her mild hyperlipidemia. Did not tolerate Crestor as well as to other statins. He cites having elevated triglycerides, cholesterol is relatively well-controlled. Her total cholesterol 224 with HDL 49. LDL is not in any Clinic due to elevated triglycerides. This is probably due to her weight. Cardiac Evaluation to date includes:  Treadmill Myoview 2011: Negative for ischemia.  Echocardiogram March 2014: For dizziness and palpitations; normal LV size and function. EF 55-60%. Normal regional wall motion and normal relaxation. Mild MR  She saw Corine Shelter, [place] on September 22 to evaluate for dizziness. She was told to hold her losartan dose. Since then she's not had any further dizziness.  Interval History: She returns today without any real true symptoms. She exercises about 3 times a week either walking on the treadmill or bicycling. She does note that she Limited by her knee pain. Her major complaints now her symptoms of menopause with hot flashes and sweating. She denies any significant palpitations or rapid heart beats. No chest pain or shortness of breath with rest or exertion. No PND, orthopnea or edema. Since stopping losartan, she denies any lightheadedness, dizziness, wooziness, vertigo or simply/near-syncope. No TIA or amaurosis fugax symptoms. No claudication symptoms.  Past Medical History  Diagnosis Date  . Vaginal cancer   . Asthma   . Migraines   . Dyslipidemia   . Shortness of breath   .  Obesity   . Anxiety   . Depression   . Allergy   . Hypertension   . IBS (irritable bowel syndrome)     Allergies  Allergen Reactions  . Bee Pollen Anaphylaxis  . Butorphanol Tartrate Other (See Comments)    REACTION: hallucinations ( stadol)  . Codeine Sulfate Hives and Nausea And Vomiting  . Crestor [Rosuvastatin] Other (See Comments)    Body aches  . Darvocet [Propoxyphene-Acetaminophen] Hives and Nausea And Vomiting  . Levofloxacin Other (See Comments)    abdominal pain  . Penicillins Other (See Comments)    Seizure   . Sulfa Antibiotics Hives and Nausea And Vomiting  . Latex Rash    Blisters and redness with bandaids-also sensitivity to latex gloves    Current Outpatient Prescriptions  Medication Sig Dispense Refill  . amphetamine-dextroamphetamine (ADDERALL) 10 MG tablet Take 10 mg by mouth daily.       [name] aspirin EC 81 MG tablet Take 81 mg by mouth every evening.       . cetirizine (ZYRTEC) 10 MG tablet Take 10 mg by mouth daily.       . Cyanocobalamin (VITAMIN B-12) 1000 MCG SUBL Place 1 tablet (1,000 mcg total) under the tongue daily.  100 tablet  3  . diphenoxylate-atropine (LOMOTIL) 2.5-0.025 MG per tablet Take 1 tablet by mouth 4 (four) times daily as needed for diarrhea or loose stools.  60 tablet  1  . eletriptan (RELPAX) 40 MG tablet Take 1 tablet (40 mg total) by mouth 2 (two) times daily as needed. For migraine. Take 1 tablet, may  repeat in 2 hours if necessary  9 tablet  3  . fluticasone (FLONASE) 50 MCG/ACT nasal spray Place 2 sprays into the nose daily as needed.      [name] ibuprofen (ADVIL,MOTRIN) 600 MG tablet Take 1 tablet (600 mg total) by mouth every 6 (six) hours as needed (mild pain).  30 tablet  2  . Multiple Vitamin (MULTIVITAMIN WITH MINERALS) TABS Take 1 tablet by mouth daily.      . Multiple Vitamins-Minerals (PRESERVISION AREDS 2) CAPS Take 1 capsule by mouth at bedtime.      . ondansetron (ZOFRAN ODT) 8 MG disintegrating tablet Take 1 tablet (8 mg  total) by mouth every 8 (eight) hours as needed for nausea.  20 tablet  0  . Probiotic Product (ALIGN) 4 MG CAPS Take 1 capsule by mouth daily.  30 capsule  1  . topiramate (TOPAMAX) 200 MG tablet Take 1 tablet (200 mg total) by mouth at bedtime.  90 tablet  1  . venlafaxine XR (EFFEXOR-XR) 150 MG 24 hr capsule TAKE 1 CAPSULE (150 MG TOTAL) BY MOUTH 2 (TWO) TIMES DAILY.  180 capsule  1  . vitamin C (ASCORBIC ACID) 500 MG tablet Take 500 mg by mouth daily.        No current facility-administered medications for this visit.    History   Social History Narrative   [name] with no children. Never smoked. Occasional alcohol.   Works as a Scientist, research (medical) at [employer]. Is on her feet but 6 days per week.   She does a treadmill at home and either walks on [place] walking outside of these 3-4 times a week.   Family History: family history includes COPD in her mother; Heart disease in her maternal grandfather, maternal grandmother, mother, paternal grandfather, and paternal grandmother; Lung cancer in her father. There is no history of Colon cancer, Esophageal cancer, Rectal cancer, or Stomach cancer.  ROS: A comprehensive Review of Systems - Negative except Bilateral knee pain from osteoarthritis menopause symptoms of hot flashes and sweating.  PHYSICAL EXAM BP 146/84  Pulse 84  Ht 5\' 1"  (1.549 m)  Wt 181 lb 6.4 oz (82.283 kg)  BMI 34.29 kg/m2 General appearance: alert, cooperative, appears stated age, no distress and moderately obese Neck: no adenopathy, no carotid bruit, no JVD and supple, symmetrical, trachea midline Lungs: clear to auscultation bilaterally, normal percussion bilaterally and Nonlabored, and air movement Heart: normal apical impulse, regular rate and rhythm, S1, S2 normal, no S3 or S4 and Very faint 1/6 HSM at the left sternal border radiating to apex. No other R./G. Abdomen: soft, non-tender; bowel sounds normal; no masses,  no organomegaly and Mesomorphic  habitus with exogenous obesity Extremities: extremities normal, atraumatic, no cyanosis or edema Pulses: 2+ and symmetric Neurologic: Alert and oriented X 3, normal strength and tone. Normal symmetric reflexes. Normal coordination and gait  AVW:[id number] today: No  Recent Labs: None since 2011  ASSESSMENT / PLAN: Dizziness Resolved after stopping losartan. Her blood pressure is mildly elevated today. But is still within acceptable range.  Hypertension Upper limit of normal to mildly abnormal. The given her recent dizziness that was thought to be orthostatic in nature, I think we'll allow her to have mild permissive hypertension.  Dyslipidemia Statin intolerant. Due for labs to be rechecked.  Plan: Lipid Panel with CMP  Obesity (BMI 30-39.9) Despite her walking, she has been relatively stable with her weight for several years now.  She really needs dietary modification and probably  some more vigorous exercise. I think her triglycerides are clearly related to obesity. We talked about reducing her carbohydrate as well as food intake.  Family history of coronary artery disease Negative cardiac evaluation today. Asymptomatic. Continue to monitor and treat risk factors as needed. She is taking aspirin.    Orders Placed This Encounter  Procedures  . Lipid panel    Order Specific Question:  Has the patient fasted?    Answer:  Yes  . Comprehensive metabolic panel    Order Specific Question:  Has the patient fasted?    Answer:  Yes    Followup: One year  DAVID W. Herbie Baltimore, M.D., M.S. [facility] [address] [place], [place]  [address]  [phone] Pager # [phone]

## 2013-04-13 ENCOUNTER — Other Ambulatory Visit: Payer: Self-pay | Admitting: Internal Medicine

## 2013-04-14 ENCOUNTER — Other Ambulatory Visit: Payer: Self-pay | Admitting: Internal Medicine

## 2013-04-21 ENCOUNTER — Ambulatory Visit: Payer: BC Managed Care – PPO | Admitting: Internal Medicine

## 2013-07-17 ENCOUNTER — Other Ambulatory Visit: Payer: Self-pay | Admitting: *Deleted

## 2013-07-17 NOTE — Telephone Encounter (Signed)
Rec fax stating Venlafaxine 150 mg max daily dose is 1. Please advise last Rx was 1 po bid. Which is correct?

## 2013-07-18 MED ORDER — VENLAFAXINE HCL ER 150 MG PO CP24: 150.0000 mg | ORAL_CAPSULE | Freq: Every day | ORAL | Status: DC

## 2013-07-18 NOTE — Telephone Encounter (Signed)
1 po qd. Pls confirm w/pt Thx

## 2013-07-25 NOTE — Telephone Encounter (Signed)
Left mess for patient to call back.  

## 2013-07-30 NOTE — Telephone Encounter (Signed)
Left mess for patient to call back.  

## 2013-08-12 ENCOUNTER — Telehealth: Payer: Self-pay | Admitting: Internal Medicine

## 2013-08-12 NOTE — Telephone Encounter (Signed)
Patient is returning missed calls about her Effexor medication. She states that she always has taken 2 a day and needs our office to call her pharmacy to let them know in order for them to be able to change it. Says that we can reach her back at her work number listed if needed and if she does not answer to let whoever answers knows that we are returning her call.

## 2013-08-12 NOTE — Telephone Encounter (Signed)
Is it Effexor XR 300 mg a day total? Thx!

## 2013-08-15 MED ORDER — VENLAFAXINE HCL ER 150 MG PO CP24: 300.0000 mg | ORAL_CAPSULE | Freq: Every day | ORAL | Status: DC

## 2013-08-15 NOTE — Telephone Encounter (Signed)
Yes, that is correct 

## 2013-08-15 NOTE — Telephone Encounter (Signed)
Ok Thx 

## 2013-08-19 ENCOUNTER — Encounter: Payer: Self-pay | Admitting: Cardiology

## 2013-08-19 ENCOUNTER — Ambulatory Visit (INDEPENDENT_AMBULATORY_CARE_PROVIDER_SITE_OTHER): Payer: BC Managed Care – PPO | Admitting: Cardiology

## 2013-08-19 VITALS — BP 140/80 | HR 80 | Ht 61.0 in | Wt 179.0 lb

## 2013-08-19 DIAGNOSIS — R42 Dizziness and giddiness: Secondary | ICD-10-CM

## 2013-08-19 DIAGNOSIS — R9389 Abnormal findings on diagnostic imaging of other specified body structures: Secondary | ICD-10-CM

## 2013-08-19 DIAGNOSIS — H579 Unspecified disorder of eye and adnexa: Secondary | ICD-10-CM

## 2013-08-19 DIAGNOSIS — I10 Essential (primary) hypertension: Secondary | ICD-10-CM

## 2013-08-19 MED ORDER — LOSARTAN POTASSIUM 50 MG PO TABS: 50.0000 mg | ORAL_TABLET | Freq: Every day | ORAL | Status: DC

## 2013-08-19 NOTE — Progress Notes (Signed)
08/22/2013   PCP: Walker Kehr, MD   Chief Complaint  Patient presents with  . Follow-up    here for BP check and per Dr. Sabra Heck pt bled behind rt eye    Primary Cardiologist:  Dr. Ellyn Hack  HPI:   48 y.o. moderately obese Caucasian female with a PMH below who presents today concern with bleed behind rt eye seen by Dr. Sabra Heck optometrist.  She is a former patient of Dr. Rollene Fare who is basically seeing cardiologist due to her strong family history of coronary disease. She has statin intolerance for her mild hyperlipidemia. Did not tolerate Crestor as well as to other statins. He cites having elevated triglycerides, cholesterol is relatively well-controlled. Her total cholesterol 224 with HDL 49. LDL is not in any Clinic due to elevated triglycerides. This is probably due to her weight. Her BP med was stopped due to dizziness and lower BP.  Now she is planning to resume adderall and is concerned her BP is elevated at times.  She has no chest pain and no SOB.   Cardiac Evaluation to date includes:  Treadmill Myoview 2011: Negative for ischemia.  Echocardiogram March 2014: For dizziness and palpitations; normal LV size and function. EF 55-60%. Normal regional wall motion and normal relaxation. Mild MR     Allergies  Allergen Reactions  . Bee Pollen Anaphylaxis  . Butorphanol Tartrate Other (See Comments)    REACTION: hallucinations ( stadol)  . Codeine Sulfate Hives and Nausea And Vomiting  . Crestor [Rosuvastatin] Other (See Comments)    Body aches  . Darvocet [Propoxyphene N-Acetaminophen] Hives and Nausea And Vomiting  . Levofloxacin Other (See Comments)    abdominal pain  . Penicillins Other (See Comments)    Seizure   . Sulfa Antibiotics Hives and Nausea And Vomiting  . Latex Rash    Blisters and redness with bandaids-also sensitivity to latex gloves    Current Outpatient Prescriptions  Medication Sig Dispense Refill  . aspirin EC 81 MG tablet Take 81  mg by mouth every evening.       . Cyanocobalamin (VITAMIN B-12) 1000 MCG SUBL Place 1 tablet (1,000 mcg total) under the tongue daily.  100 tablet  3  . diphenoxylate-atropine (LOMOTIL) 2.5-0.025 MG per tablet Take 1 tablet by mouth 4 (four) times daily as needed for diarrhea or loose stools.  60 tablet  1  . eletriptan (RELPAX) 40 MG tablet Take 1 tablet (40 mg total) by mouth 2 (two) times daily as needed. For migraine. Take 1 tablet, may repeat in 2 hours if necessary  9 tablet  3  . fluticasone (FLONASE) 50 MCG/ACT nasal spray Place 2 sprays into the nose daily as needed.      [name] ibuprofen (ADVIL,MOTRIN) 600 MG tablet Take 1 tablet (600 mg total) by mouth every 6 (six) hours as needed (mild pain).  30 tablet  2  . Multiple Vitamin (MULTIVITAMIN WITH MINERALS) TABS Take 1 tablet by mouth daily.      . Multiple Vitamins-Minerals (PRESERVISION AREDS 2) CAPS Take 1 capsule by mouth at bedtime.      . ondansetron (ZOFRAN ODT) 8 MG disintegrating tablet Take 1 tablet (8 mg total) by mouth every 8 (eight) hours as needed for nausea.  20 tablet  0  . Probiotic Product (ALIGN) 4 MG CAPS Take 1 capsule by mouth daily.  30 capsule  1  . topiramate (TOPAMAX) 200 MG tablet Take 1 tablet (200 mg  total) by mouth at bedtime.  90 tablet  1  . venlafaxine XR (EFFEXOR-XR) 150 MG 24 hr capsule Take 2 capsules (300 mg total) by mouth daily with breakfast.  180 capsule  1  . vitamin C (ASCORBIC ACID) 500 MG tablet Take 500 mg by mouth daily.       [name] amphetamine-dextroamphetamine (ADDERALL) 10 MG tablet Take 10 mg by mouth daily.       . cetirizine (ZYRTEC) 10 MG tablet Take 10 mg by mouth daily.       [name] losartan (COZAAR) 50 MG tablet Take 1 tablet (50 mg total) by mouth daily.  30 tablet  6   No current facility-administered medications for this visit.    Past Medical History  Diagnosis Date  . Vaginal cancer   . Asthma   . Migraines   . Dyslipidemia   . Shortness of breath   . Obesity   . Anxiety   .  Depression   . Allergy   . Hypertension   . IBS (irritable bowel syndrome)     Past Surgical History  Procedure Laterality Date  . Knee surgery    . Abdominal hysterectomy      still has bilateral ovaries  . Cholecystectomy  04-04-11    single site  . Laparotomy  05/12/2012    Procedure: LAPAROTOMY;  Surgeon: Cyril Mourning, MD;  Location: [facility];  Service: Gynecology;;  laparotomy [name] salpingectomy-oopharectomy  . Nm myocar perf wall motion  09/06/2009    normal  . Colonoscopy w/ biopsies  01/24/2013  . Transthoracic echocardiogram  March 2014    Normal regional wall motion. EF 55%. Mild MR    JIR:CVELFYB:OF colds or fevers, no weight changes Skin:no rashes or ulcers HEENT:no blurred vision, no congestion CV:see HPI PUL:see HPI GI:no diarrhea constipation or melena, no indigestion GU:no hematuria, no dysuria MS:no joint pain, no claudication Neuro:no syncope, no lightheadedness Endo:no diabetes, no thyroid disease Psych:  Has anxiety depression and on multiple meds.  PHYSICAL EXAM BP 140/80  Pulse 80  Ht 5\' 1"  (1.549 m)  Wt 179 lb (81.194 kg)  BMI 33.84 kg/m2 General:Pleasant affect, NAD Skin:Warm and dry, brisk capillary refill HEENT:normocephalic, sclera clear, mucus membranes moist Neck:supple, no JVD, no bruits  Heart:S1S2 RRR without murmur, gallup, rub or click Lungs:clear without rales, rhonchi, or wheezes BPZ:WCHE, non tender, + BS, do not palpate liver spleen or masses Ext:no lower ext edema, 2+ pedal pulses, 2+ radial pulses Neuro:alert and oriented, MAE, follows commands, + facial symmetry   ASSESSMENT AND PLAN Eye exam abnormal, was told she had bled behind eye Called to be seen because Dr. Sabra Heck on eye exam told her she had bled behind rt eye.  No change in vision.  I have asked her to hold her asprin for 2 weeks as well.  Follow up with Dr. Ellyn Hack in 2 weeks.  Hypertension BP borderline but she is to resume adderall next week and is  concerned that BP may be increasing.  She is also on Effexor.  I have resumed her losartan at 50 mg.  She preferred to go back to previous medication.  Dizziness No further episodes.

## 2013-08-19 NOTE — Patient Instructions (Signed)
Hold Asprin for 2 weeks then resume.  Begin losartan 50 mg daily.  Begin adderall as instructed.    Follow up with Dr. Ellyn Hack in 4 weeks.

## 2013-08-22 DIAGNOSIS — H579 Unspecified disorder of eye and adnexa: Secondary | ICD-10-CM | POA: Insufficient documentation

## 2013-08-22 NOTE — Assessment & Plan Note (Signed)
BP borderline but she is to resume adderall next week and is concerned that BP may be increasing.  She is also on Effexor.  I have resumed her losartan at 50 mg.  She preferred to go back to previous medication.

## 2013-08-22 NOTE — Assessment & Plan Note (Signed)
No further episodes

## 2013-08-22 NOTE — Assessment & Plan Note (Addendum)
Called to be seen because Dr. Sabra Heck on eye exam told her she had bled behind rt eye.  No change in vision.  I have asked her to hold her asprin for 2 weeks as well.  Follow up with Dr. Ellyn Hack in 2 weeks.

## 2013-08-24 IMAGING — MG MM DIGITAL DIAGNOSTIC BILAT
6 series · 6 of 6 positions shown · non-contrast
Comparison: 02/11/2010

CLINICAL DATA: Palpable abnormalities in the left breast.  The
patient reports lumpiness bilaterally.

DIGITAL DIAGNOSTIC BILATERAL MAMMOGRAM CAD AND BILATERAL BREAST
ULTRASOUND:

[R CC]
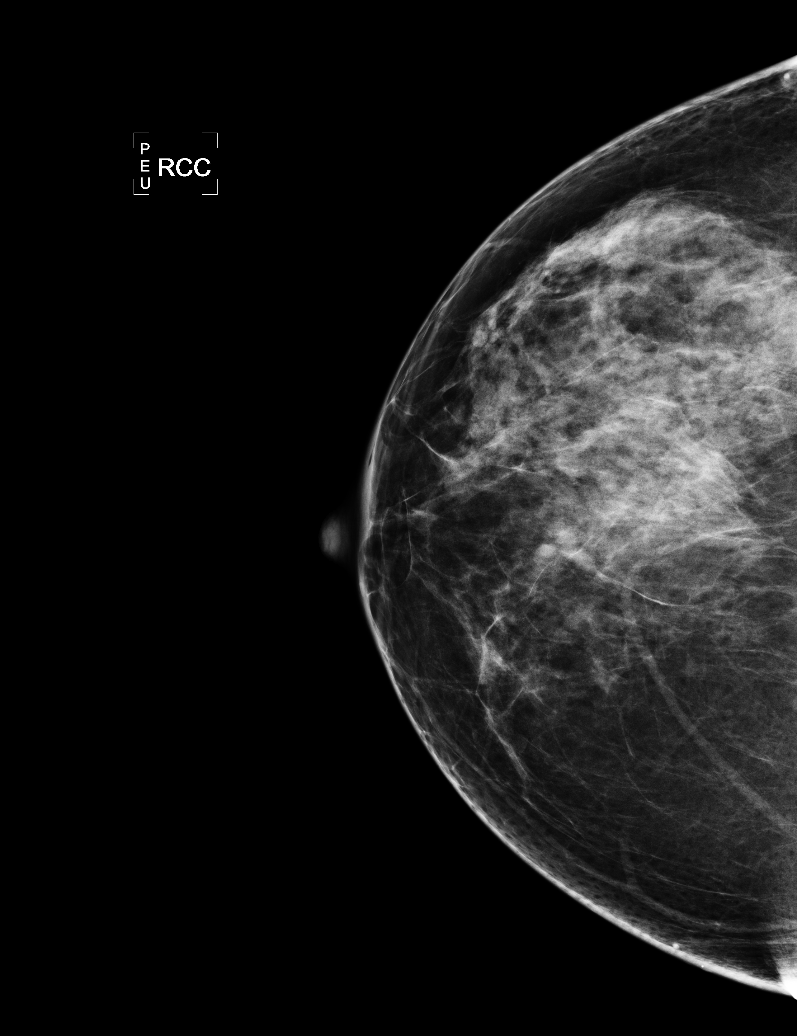

[L CC]
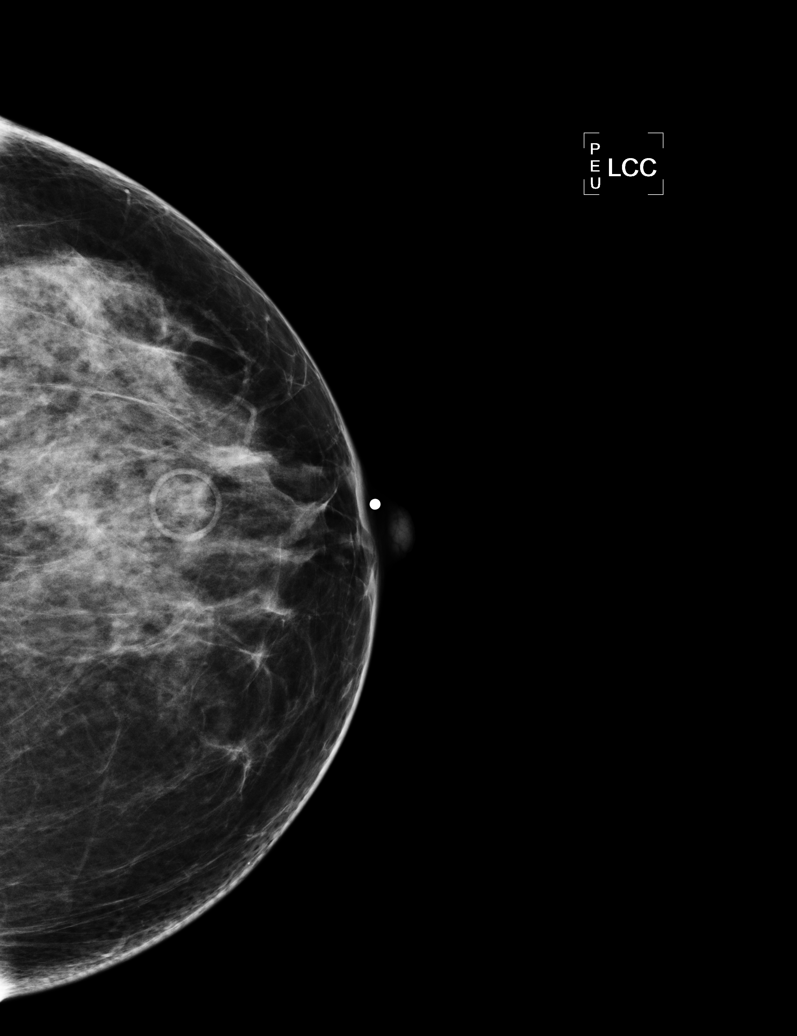

[L MLO (1 of 2)]
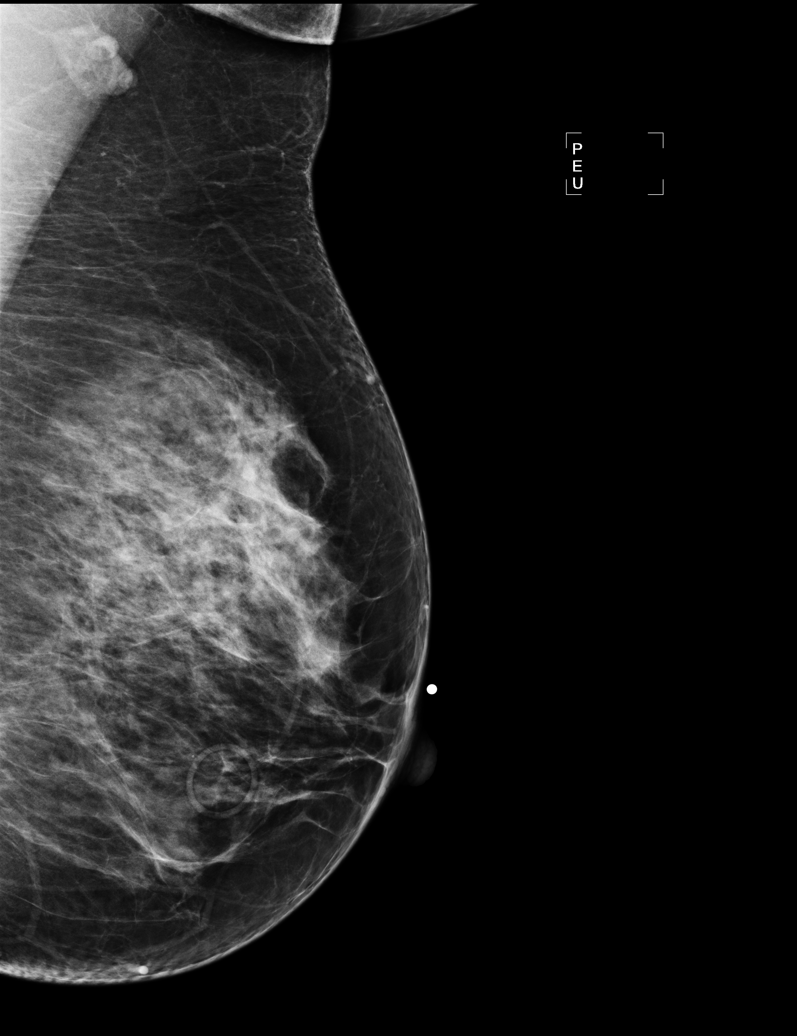

[R MLO]
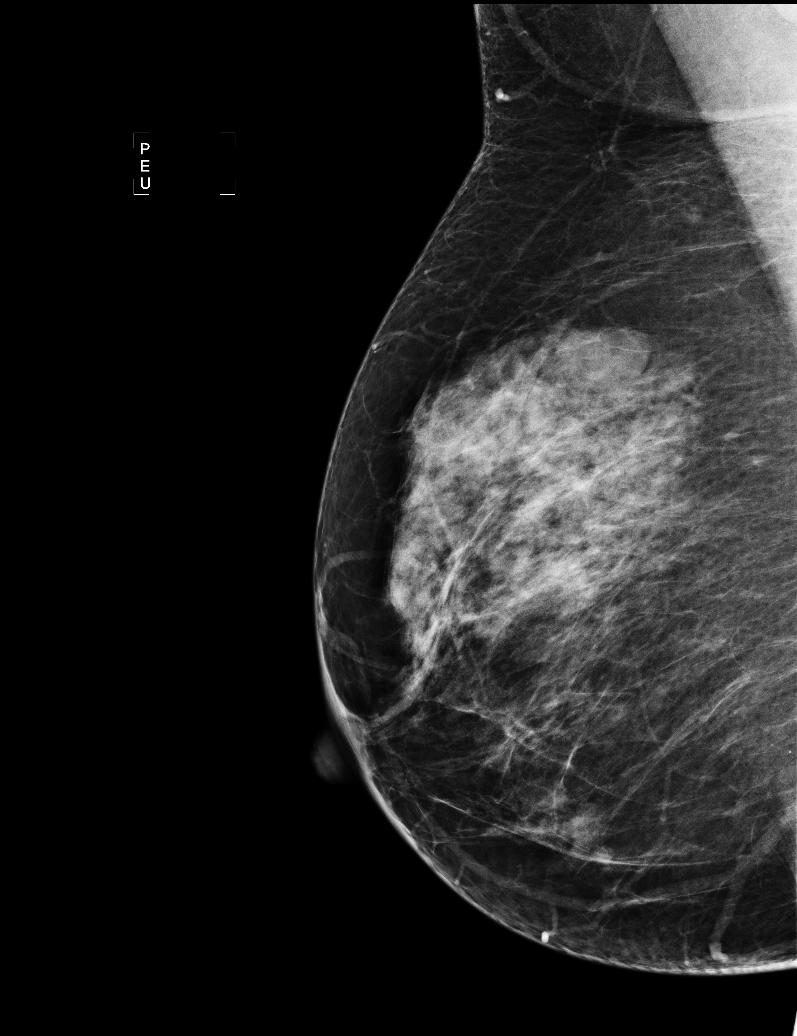

[L TAN]
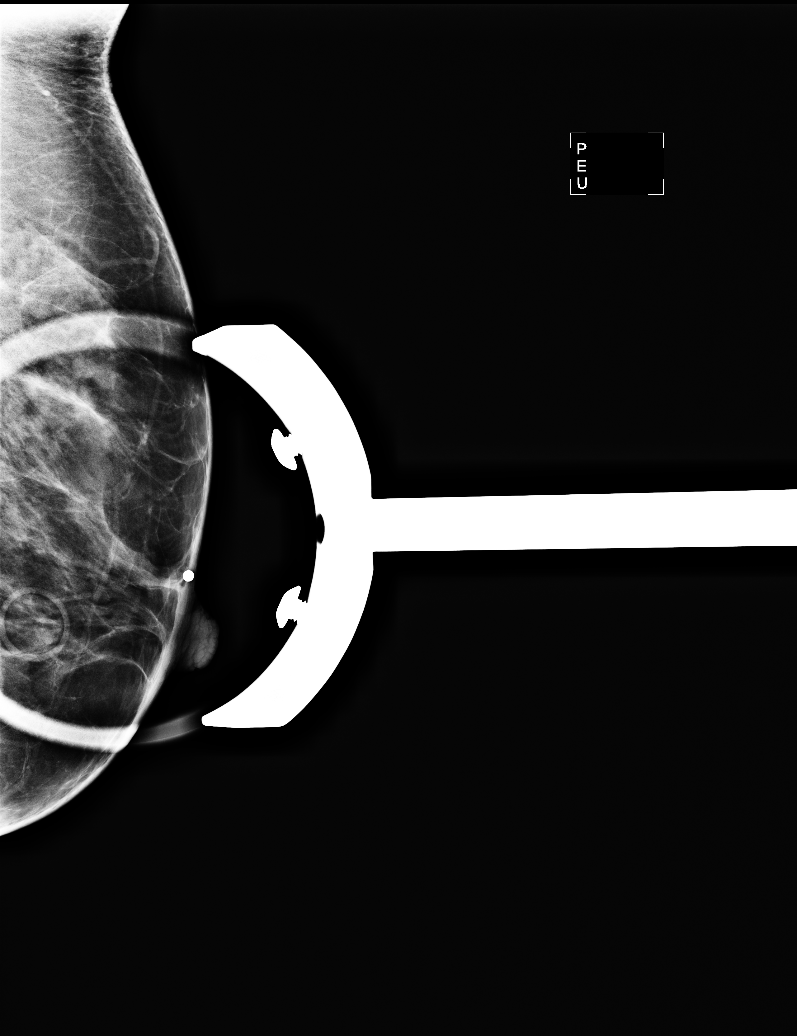

[L MLO (2 of 2)]
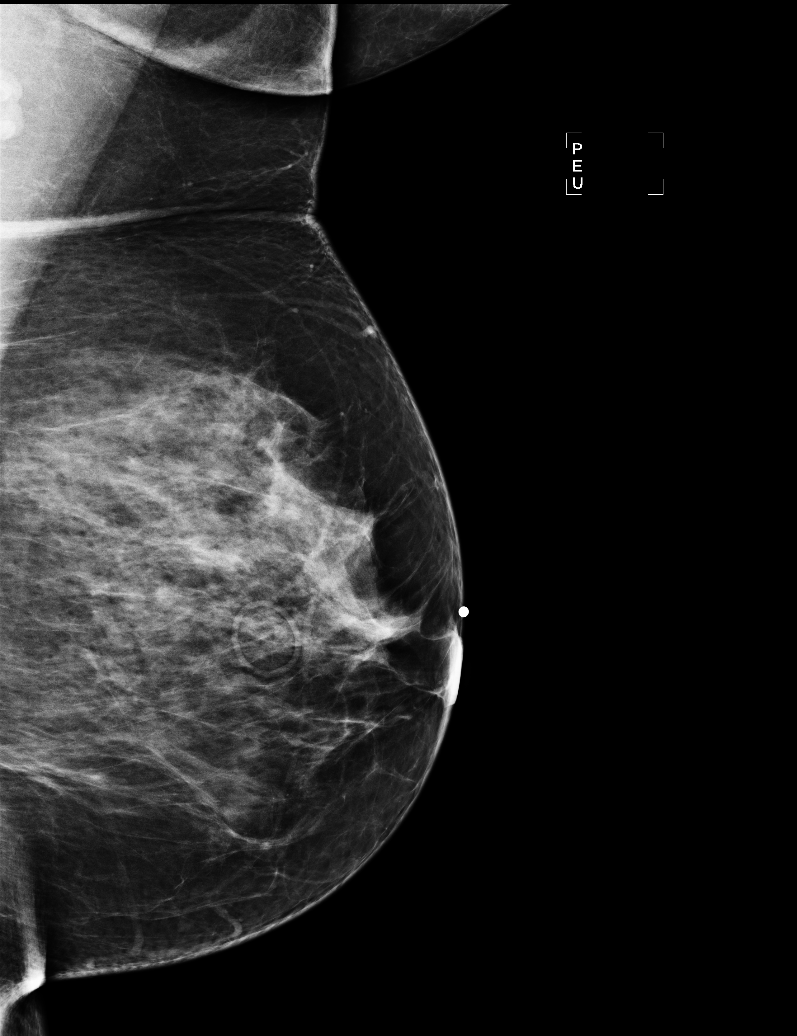

[6 of 6 positions shown; findings below may reference images not displayed]

FINDINGS: Breast parenchyma is heterogeneously dense bilaterally.
On the left, the patient has concern regarding the central
retroareolar region.  Spot tangential view of this region is
unremarkable.

On the right, there is a partially obscured oval mass in the upper-
outer quadrant, further evaluated with ultrasound.
Mammographic images were processed with CAD.

On physical exam, I palpate soft nodularity in the circumareolar
region of the left breast.  There is also nodularity in the 2
o'clock location of the left breast, in the area of her physician's
concern.  In the upper-outer quadrant of the right breast, I
palpate nodularity without discrete mass.

Ultrasound is performed, showing normal-appearing breast parenchyma
in the subareolar region of the left breast and the upper-outer
quadrant of the left breast.  No discrete mass is identified in
either of these locations.

Within the 10 o'clock location of the right breast 10 cm from the
nipple, there is a hypoechoic circumscribed nodule is measuring
x 0.8 x 1.7 cm.  Mass does demonstrate some angular margins and
microlobulations.  Therefore, biopsy is recommended.  Evaluation of
the right axilla is negative.
IMPRESSION: 1.  No mammographic or sonographic evidence for malignancy in the
left breast and in the areas of concern to the patient and her
physician.
2.  Oval micro lobulated mass in the 10 o'clock location of the
right breast warrants further evaluation with ultrasound guided
core biopsy.  This has been scheduled at the convenience of the
patient on [REDACTED] [DATE] at [DATE] p.m..

BI-RADS CATEGORY 4:  Suspicious abnormality - biopsy should be
considered.

## 2013-09-04 ENCOUNTER — Other Ambulatory Visit: Payer: Self-pay | Admitting: Dermatology

## 2013-10-16 ENCOUNTER — Ambulatory Visit: Payer: BC Managed Care – PPO | Admitting: Cardiology

## 2013-10-31 ENCOUNTER — Encounter: Payer: Self-pay | Admitting: Cardiology

## 2013-10-31 ENCOUNTER — Ambulatory Visit (INDEPENDENT_AMBULATORY_CARE_PROVIDER_SITE_OTHER): Payer: BC Managed Care – PPO | Admitting: Cardiology

## 2013-10-31 VITALS — BP 122/78 | HR 67 | Ht 60.0 in | Wt 170.7 lb

## 2013-10-31 DIAGNOSIS — E785 Hyperlipidemia, unspecified: Secondary | ICD-10-CM

## 2013-10-31 DIAGNOSIS — E669 Obesity, unspecified: Secondary | ICD-10-CM

## 2013-10-31 DIAGNOSIS — I10 Essential (primary) hypertension: Secondary | ICD-10-CM

## 2013-10-31 DIAGNOSIS — R42 Dizziness and giddiness: Secondary | ICD-10-CM

## 2013-10-31 NOTE — Patient Instructions (Signed)
Your physician recommends that you return for FASTING lab work   Your physician recommends that you schedule a follow-up appointment in: [place]

## 2013-11-01 ENCOUNTER — Encounter: Payer: Self-pay | Admitting: Cardiology

## 2013-11-01 NOTE — Assessment & Plan Note (Signed)
Finally seems to losing some weight, however she feels like she is losing more than is measured. Quite likely is building muscle mass as well.

## 2013-11-01 NOTE — Assessment & Plan Note (Signed)
Statin intolerance. Taking fish oil. Trying to lose weight and change diet.  Plan: Lipid profile with LFTs

## 2013-11-01 NOTE — Assessment & Plan Note (Signed)
Well-controlled. No increase after restarting Adderall. She is on losartan 50 mg.

## 2013-11-01 NOTE — Assessment & Plan Note (Signed)
Much improved, tolerating her full dose of 50 mg losartan without problems. I suspect the episode she had was related more to hypotension from dehydration.

## 2013-11-01 NOTE — Progress Notes (Signed)
PCP: Walker Kehr, MD  Clinic Note: Chief Complaint  Patient presents with  . 6 MONTH VISIT     HAD APPT IN 07/2013 SAW LAURA, NO CHEST PAIN ,NO SOB,NO EDEMA    HPI: Amy Lang is a 48 y.o. female with a Cardiovascular Problem List below who presents today for six-month followup. She saw large Dorene Ar here a few months ago for followup in a situation where she had some hemorrhage in either retina that was determined to be a relatively benign finding. She held her aspirin.  Interval History: She is doing really well. 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 She's been trying to lose weight but feels that she may have lost some fat between muscle weight since her clothes sizes of all reduced significantly. She is feeling much better about herself, much more energy.  Even her knees or not painful. She denies any chest tightness, pressure or dyspnea with rest or exertion. No PND, orthopnea or edema. Very rare intermittent palpitations otherwise no lightheadedness, dizziness or success recently, no TIA or RCA symptoms. No melena hematochezia, hematuria, epistaxis. No claudication.  Past Medical History  Diagnosis Date  . Vaginal cancer   . Asthma   . Migraines   . Dyslipidemia   . Shortness of breath   . Obesity   . Anxiety   . Depression   . Allergy   . Hypertension   . IBS (irritable bowel syndrome)    Prior Cardiac Evaluation and Past Surgical History: Procedure Laterality Date  . Nm myocar perf wall motion - exercise   09/06/2009    Normal- no ischemia or infarction   . Transthoracic echocardiogram  March 2014    Normal regional wall motion. EF 55%. Mild MR   MEDICATIONS AND ALLERGIES REVIEWED IN EPIC No Change in Social and Family History  ROS: A comprehensive Review of Systems - Negative except occasional positional dizziness - but is fine back on full 50 mg losartan.  Bilateral OA related knee pain  PHYSICAL  EXAM BP 122/78  Pulse 67  Ht 5' (1.524 m)  Wt 170 lb 11.2 oz (77.429 kg)  BMI 33.34 kg/m2 General appearance: alert, cooperative, appears stated age, no distress and moderately obese  Neck: no adenopathy, no carotid bruit, no JVD and supple, symmetrical, trachea midline  Lungs: clear to auscultation bilaterally, normal percussion bilaterally and Nonlabored, and air movement  Heart: normal apical impulse, regular rate and rhythm, S1, S2 normal, no S3 or S4 and Very faint 1/6 HSM at the left sternal border radiating to apex. No other R./G.  Abdomen: soft, NT/ND/NABS, No HSM; Mesomorphic habitus with exogenous obesity  Extremities: extremities normal, atraumatic, no cyanosis or edema  Pulses: 2+ and symmetric  Neurologic: A&OX 3, normal strength and tone. Normal symmetric reflexes. Normal coordination and gait   Adult ECG Report  Rate: 67 ;  Rhythm: normal sinus rhythm - Normal EKG   Recent Labs Not available:   ASSESSMENT / PLAN: Hypertension Well-controlled. No increase after restarting Adderall. She is on losartan 50 mg.  Hyperlipidemia Statin intolerance. Taking fish oil. Trying to lose weight and change diet.  Plan: Lipid profile with LFTs  Obesity (BMI 30-39.9) Finally seems to losing some weight, however she feels like she is losing more than is measured. Quite likely is building muscle mass as well.  Dizziness Much improved, tolerating her full dose of 50 mg losartan without problems. I suspect the episode  she had was related more to hypotension from dehydration.    Orders Placed This Encounter  Procedures  . Hepatic function panel  . Lipid Profile  . EKG 12-Lead   No orders of the defined types were placed in this encounter.    Followup: 6 months  DAVID W. Ellyn Hack, M.D., M.S. Interventional Cardiologist [facility]

## 2014-01-13 ENCOUNTER — Other Ambulatory Visit: Payer: Self-pay

## 2014-01-13 DIAGNOSIS — Z1231 Encounter for screening mammogram for malignant neoplasm of breast: Secondary | ICD-10-CM

## 2014-01-19 ENCOUNTER — Ambulatory Visit
Admission: RE | Admit: 2014-01-19 | Discharge: 2014-01-19 | Disposition: A | Discharge status: Home / Self Care | Payer: BC Managed Care – PPO | Source: Ambulatory Visit

## 2014-01-19 DIAGNOSIS — Z1231 Encounter for screening mammogram for malignant neoplasm of breast: Secondary | ICD-10-CM

## 2014-02-10 ENCOUNTER — Other Ambulatory Visit: Payer: Self-pay | Admitting: Internal Medicine

## 2014-02-16 ENCOUNTER — Other Ambulatory Visit: Payer: Self-pay | Admitting: Obstetrics and Gynecology

## 2014-02-19 LAB — CYTOLOGY - PAP

## 2014-03-10 ENCOUNTER — Other Ambulatory Visit: Payer: Self-pay | Admitting: Internal Medicine

## 2014-03-13 ENCOUNTER — Other Ambulatory Visit: Payer: Self-pay | Admitting: Dermatology

## 2014-03-16 ENCOUNTER — Other Ambulatory Visit: Payer: Self-pay | Admitting: Internal Medicine

## 2014-04-11 ENCOUNTER — Other Ambulatory Visit: Payer: Self-pay | Admitting: Cardiology

## 2014-04-11 ENCOUNTER — Other Ambulatory Visit: Payer: Self-pay | Admitting: Internal Medicine

## 2014-04-13 NOTE — Telephone Encounter (Signed)
Rx refill sent to patient pharmacy   

## 2014-04-27 ENCOUNTER — Encounter: Payer: Self-pay | Admitting: Cardiology

## 2014-04-27 ENCOUNTER — Ambulatory Visit (INDEPENDENT_AMBULATORY_CARE_PROVIDER_SITE_OTHER): Payer: BC Managed Care – PPO | Admitting: Cardiology

## 2014-04-27 VITALS — BP 138/82 | HR 68 | Ht 60.0 in | Wt 172.8 lb

## 2014-04-27 DIAGNOSIS — I10 Essential (primary) hypertension: Secondary | ICD-10-CM

## 2014-04-27 DIAGNOSIS — G43809 Other migraine, not intractable, without status migrainosus: Secondary | ICD-10-CM

## 2014-04-27 NOTE — Assessment & Plan Note (Signed)
Followed by PCP

## 2014-04-27 NOTE — Assessment & Plan Note (Signed)
Controlled.  

## 2014-04-27 NOTE — Progress Notes (Signed)
04/27/2014   PCP: Walker Kehr, MD   Chief Complaint  Patient presents with  . Follow-up    6 month. no chest pain, dyspnea, or swelling.     Primary Cardiologist:Dr. Roni Bread   HPI:  48 year old female here today for 6 month follow up for Dr. Ellyn Hack.  She has no complaints, no chest pain, no SOB.  She is under a lot of stress recently with customer death and family member, now her mother in law is in hospice.  She has not been able to exercise because of this.  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    Nm myocar perf wall motion - exercise   09/06/2009     Normal- no ischemia or infarction   . Transthoracic echocardiogram  March 2014    Normal regional wall motion. EF 55%. Mild MR   She does have a hx of panic attacks.    Allergies  Allergen Reactions  . Bee Pollen Anaphylaxis  . Butorphanol Tartrate Other (See Comments)    REACTION: hallucinations ( stadol)  . Codeine Sulfate Hives and Nausea And Vomiting  . Crestor [Rosuvastatin] Other (See Comments)    Body aches  . Darvocet [Propoxyphene N-Acetaminophen] Hives and Nausea And Vomiting  . Levofloxacin Other (See Comments)    abdominal pain  . Penicillins Other (See Comments)    Seizure   . Sulfa Antibiotics Hives and Nausea And Vomiting  . Latex Rash    Blisters and redness with bandaids-also sensitivity to latex gloves    Current Outpatient Prescriptions  Medication Sig Dispense Refill  . amphetamine-dextroamphetamine (ADDERALL) 10 MG tablet Take 10 mg by mouth daily.     [name] aspirin EC 81 MG tablet Take 81 mg by mouth every evening.     . cetirizine (ZYRTEC) 10 MG tablet Take 10 mg by mouth daily.     . diphenoxylate-atropine (LOMOTIL) 2.5-0.025 MG per tablet Take 1 tablet by mouth 4 (four) times daily as needed for diarrhea or loose stools. 60 tablet 1  . fluticasone (FLONASE) 50 MCG/ACT nasal spray  Place 2 sprays into the nose daily as needed.    [name] ibuprofen (ADVIL,MOTRIN) 600 MG tablet Take 1 tablet (600 mg total) by mouth every 6 (six) hours as needed (mild pain). 30 tablet 2  . losartan (COZAAR) 50 MG tablet TAKE 1 TABLET (50 MG TOTAL) BY MOUTH DAILY. 30 tablet 6  . Multiple Vitamin (MULTIVITAMIN WITH MINERALS) TABS Take 1 tablet by mouth daily.    . Multiple Vitamins-Minerals (PRESERVISION AREDS 2) CAPS Take 1 capsule by mouth at bedtime.    . ondansetron (ZOFRAN ODT) 8 MG disintegrating tablet Take 1 tablet (8 mg total) by mouth every 8 (eight) hours as needed for nausea. 20 tablet 0  . Probiotic Product (ALIGN) 4 MG CAPS Take 1 capsule by mouth daily. 30 capsule 1  . RELPAX 40 MG tablet TAKE 1 TABLET BY MOUTH TWICE A DAY AS NEEDED MAY REPEAT IN 2 HOURS IF NEEDED 10 tablet 0  . topiramate (TOPAMAX) 200 MG tablet Take 1 tablet (200 mg total) by mouth at bedtime. 90 tablet 1  . topiramate (TOPAMAX) 200 MG tablet TAKE 1 TABLET (200 MG TOTAL) BY MOUTH AT BEDTIME. 90 tablet 1  . venlafaxine XR (EFFEXOR-XR) 150 MG 24 hr capsule TAKE 2 CAPSULES (300 MG TOTAL) BY MOUTH DAILY WITH  BREAKFAST. 180 capsule 0  . vitamin C (ASCORBIC ACID) 500 MG tablet Take 500 mg by mouth daily.      No current facility-administered medications for this visit.    Past Medical History  Diagnosis Date  . Vaginal cancer   . Asthma   . Migraines   . Dyslipidemia   . Shortness of breath   . Obesity   . Anxiety   . Depression   . Allergy   . Hypertension   . IBS (irritable bowel syndrome)     Past Surgical History  Procedure Laterality Date  . Knee surgery    . Abdominal hysterectomy      still has bilateral ovaries  . Cholecystectomy  04-04-11    single site  . Laparotomy  05/12/2012    Procedure: LAPAROTOMY;  Surgeon: Cyril Mourning, MD;  Location: [facility];  Service: Gynecology;;  laparotomy [name] salpingectomy-oopharectomy  . Nm myocar perf wall motion  09/06/2009    normal  . Colonoscopy w/  biopsies  01/24/2013  . Transthoracic echocardiogram  March 2014    Normal regional wall motion. EF 55%. Mild MR    GYJ:EHUDJSH:FW colds or fevers, no weight changes Skin:no rashes or ulcers HEENT:no blurred vision, no congestion CV:see HPI PUL:see HPI GI:no diarrhea constipation or melena, no indigestion GU:no hematuria, no dysuria MS:no joint pain, no claudication Neuro:no syncope, no lightheadedness Endo:no diabetes, no thyroid disease  Wt Readings from Last 3 Encounters:  04/27/14 172 lb 12.8 oz (78.382 kg)  10/31/13 170 lb 11.2 oz (77.429 kg)  08/19/13 179 lb (81.194 kg)    PHYSICAL EXAM BP 138/82 mmHg  Pulse 68  Ht 5' (1.524 m)  Wt 172 lb 12.8 oz (78.382 kg)  BMI 33.75 kg/m2 General:Pleasant affect, NAD Skin:Warm and dry, brisk capillary refill HEENT:normocephalic, sclera clear, mucus membranes moist Neck:supple, no JVD, no bruits  Heart:S1S2 RRR without murmur, gallup, rub or click Lungs:clear without rales, rhonchi, or wheezes YOV:ZCHY, non tender, + BS, do not palpate liver spleen or masses Ext:no lower ext edema, 2+ pedal pulses, 2+ radial pulses Neuro:alert and oriented X 3, MAE, follows commands, + facial symmetry  EKG:SR normal EKG HR 68   ASSESSMENT AND PLAN Hypertension Controlled.  Migraine Followed by PCP   She will follow up with Dr. Roni Bread in 6 months.

## 2014-04-27 NOTE — Patient Instructions (Signed)
Your physician recommends that you schedule a follow-up appointment in: 6 months with Dr Harding 

## 2014-05-12 ENCOUNTER — Telehealth: Payer: Self-pay | Admitting: Internal Medicine

## 2014-05-12 NOTE — Telephone Encounter (Signed)
Patient with a 4 week history of abdominal and epigastric pain.  She has tried OTC products with no relief. She will come in and see Alonza Bogus, PA on 05/14/14 9:30

## 2014-05-14 ENCOUNTER — Other Ambulatory Visit (INDEPENDENT_AMBULATORY_CARE_PROVIDER_SITE_OTHER): Payer: BC Managed Care – PPO

## 2014-05-14 ENCOUNTER — Encounter: Payer: Self-pay | Admitting: Gastroenterology

## 2014-05-14 ENCOUNTER — Ambulatory Visit (INDEPENDENT_AMBULATORY_CARE_PROVIDER_SITE_OTHER): Payer: BC Managed Care – PPO | Admitting: Gastroenterology

## 2014-05-14 VITALS — BP 140/84 | HR 80 | Ht 61.0 in | Wt 174.2 lb

## 2014-05-14 DIAGNOSIS — R1110 Vomiting, unspecified: Secondary | ICD-10-CM

## 2014-05-14 DIAGNOSIS — R1013 Epigastric pain: Secondary | ICD-10-CM | POA: Insufficient documentation

## 2014-05-14 DIAGNOSIS — R112 Nausea with vomiting, unspecified: Secondary | ICD-10-CM | POA: Insufficient documentation

## 2014-05-14 LAB — CBC WITH DIFFERENTIAL/PLATELET
BASOS PCT: 0.3 % (ref 0.0–3.0)
Basophils Absolute: 0 10*3/uL (ref 0.0–0.1)
EOS ABS: 0 10*3/uL (ref 0.0–0.7)
EOS PCT: 0.3 % (ref 0.0–5.0)
HCT: 40.8 % (ref 36.0–46.0)
Hemoglobin: 13.5 g/dL (ref 12.0–15.0)
Lymphocytes Relative: 34.6 % (ref 12.0–46.0)
Lymphs Abs: 2.1 10*3/uL (ref 0.7–4.0)
MCHC: 33 g/dL (ref 30.0–36.0)
MCV: 88.6 fl (ref 78.0–100.0)
MONOS PCT: 6.6 % (ref 3.0–12.0)
Monocytes Absolute: 0.4 10*3/uL (ref 0.1–1.0)
NEUTROS PCT: 58.2 % (ref 43.0–77.0)
Neutro Abs: 3.6 10*3/uL (ref 1.4–7.7)
Platelets: 200 10*3/uL (ref 150.0–400.0)
RBC: 4.61 Mil/uL (ref 3.87–5.11)
RDW: 13.1 % (ref 11.5–15.5)
WBC: 6.1 10*3/uL (ref 4.0–10.5)

## 2014-05-14 LAB — COMPREHENSIVE METABOLIC PANEL
ALBUMIN: 4.1 g/dL (ref 3.5–5.2)
ALT: 27 U/L (ref 0–35)
AST: 21 U/L (ref 0–37)
Alkaline Phosphatase: 80 U/L (ref 39–117)
BUN: 15 mg/dL (ref 6–23)
CALCIUM: 8.9 mg/dL (ref 8.4–10.5)
CO2: 22 mEq/L (ref 19–32)
Chloride: 109 mEq/L (ref 96–112)
Creatinine, Ser: 0.7 mg/dL (ref 0.4–1.2)
GFR: 90.92 mL/min (ref >60.00)
GLUCOSE: 98 mg/dL (ref 70–99)
POTASSIUM: 4.1 meq/L (ref 3.5–5.1)
Sodium: 140 mEq/L (ref 135–145)
Total Bilirubin: 0.5 mg/dL (ref 0.2–1.2)
Total Protein: 7 g/dL (ref 6.0–8.3)

## 2014-05-14 LAB — AMYLASE: Amylase: 51 U/L (ref 27–131)

## 2014-05-14 LAB — LIPASE: LIPASE: 33 U/L (ref 11.0–59.0)

## 2014-05-14 MED ORDER — PANTOPRAZOLE SODIUM 40 MG PO TBEC: 40.0000 mg | DELAYED_RELEASE_TABLET | Freq: Two times a day (BID) | ORAL | Status: DC

## 2014-05-14 NOTE — Patient Instructions (Addendum)
You have been scheduled for an endoscopy. Please follow written instructions given to you at your visit today. If you use inhalers (even only as needed), please bring them with you on the day of your procedure.  You have been scheduled for an abdominal ultrasound at [facility] (1st floor of hospital) on 05-15-2014 at 9:30 am. Please arrive 15 minutes prior to your appointment for registration. Make certain not to have anything to eat or drink 6 hours prior to your appointment. Should you need to reschedule your appointment, please contact radiology at [phone]. This test typically takes about 30 minutes to perform.  Your physician has requested that you go to the basement for the following lab work before leaving today: CBC CMET AMYLASE LIPASE   We have sent the following medications to your pharmacy for you to pick up at your convenience: Pantoprazole 40 mg, one tablet by mouth twice daily  ___________________________________________________________________________________________________________________________________ Gastroesophageal Reflux Disease, Adult Gastroesophageal reflux disease (GERD) happens when acid from your stomach flows up into the esophagus. When acid comes in contact with the esophagus, the acid causes soreness (inflammation) in the esophagus. Over time, GERD may create small holes (ulcers) in the lining of the esophagus. CAUSES   Increased body weight. This puts pressure on the stomach, making acid rise from the stomach into the esophagus.  Smoking. This increases acid production in the stomach.  Drinking alcohol. This causes decreased pressure in the lower esophageal sphincter (valve or ring of muscle between the esophagus and stomach), allowing acid from the stomach into the esophagus.  Late evening meals and a full stomach. This increases pressure and acid production in the stomach.  A malformed lower esophageal sphincter. Sometimes, no cause is  found. SYMPTOMS   Burning pain in the lower part of the mid-chest behind the breastbone and in the mid-stomach area. This may occur twice a week or more often.  Trouble swallowing.  Sore throat.  Dry cough.  Asthma-like symptoms including chest tightness, shortness of breath, or wheezing. DIAGNOSIS  Your caregiver may be able to diagnose GERD based on your symptoms. In some cases, X-rays and other tests may be done to check for complications or to check the condition of your stomach and esophagus. TREATMENT  Your caregiver may recommend over-the-counter or prescription medicines to help decrease acid production. Ask your caregiver before starting or adding any new medicines.  HOME CARE INSTRUCTIONS   Change the factors that you can control. Ask your caregiver for guidance concerning weight loss, quitting smoking, and alcohol consumption.  Avoid foods and drinks that make your symptoms worse, such as:  Caffeine or alcoholic drinks.  Chocolate.  Peppermint or mint flavorings.  Garlic and onions.  Spicy foods.  Citrus fruits, such as oranges, lemons, or limes.  Tomato-based foods such as sauce, chili, salsa, and pizza.  Fried and fatty foods.  Avoid lying down for the 3 hours prior to your bedtime or prior to taking a nap.  Eat small, frequent meals instead of large meals.  Wear loose-fitting clothing. Do not wear anything tight around your waist that causes pressure on your stomach.  Raise the head of your bed 6 to 8 inches with wood blocks to help you sleep. Extra pillows will not help.  Only take over-the-counter or prescription medicines for pain, discomfort, or fever as directed by your caregiver.  Do not take aspirin, ibuprofen, or other nonsteroidal anti-inflammatory drugs (NSAIDs). SEEK IMMEDIATE MEDICAL CARE IF:   You have pain in your arms,  neck, jaw, teeth, or back.  Your pain increases or changes in intensity or duration.  You develop nausea, vomiting,  or sweating (diaphoresis).  You develop shortness of breath, or you faint.  Your vomit is green, yellow, black, or looks like coffee grounds or blood.  Your stool is red, bloody, or black. These symptoms could be signs of other problems, such as heart disease, gastric bleeding, or esophageal bleeding. MAKE SURE YOU:   Understand these instructions.  Will watch your condition.  Will get help right away if you are not doing well or get worse. Document Released: 02/22/2005 Document Revised: 08/07/2011 Document Reviewed: 12/02/2010 [facility] Patient Information 2015 [place], [place]. This information is not intended to replace advice given to you by your health care provider. Make sure you discuss any questions you have with your health care provider.

## 2014-05-14 NOTE — Progress Notes (Signed)
     05/14/2014 Amy Lang [id number] 02-04-1966   History of Present Illness:  This is a 48 year old female who is previously known to Dr. Carlean Purl for colonoscopy.  She presents to our office today with complaints of epigastric abdominal pain and nausea/vomiting that has been occurring for the past 4 weeks.  She says that these issues began suddenly and have been persistent, occuring almost every night.  Symptoms are worse at night and wake her from sleep.  She does not have a gallbladder, but she feels like she had similar symptoms before her gallbladder was removed.  She tried taking some Zantac a couple of times without relief.  Feels bloated as well.  Takes Ibuprofen, but only on rare occasion for migraines.   Current Medications, Allergies, Past Medical History, Past Surgical History, Family History and Social History were reviewed in [employer] record.   Physical Exam: BP 140/84 mmHg  Pulse 80  Ht 5\' 1"  (1.549 m)  Wt 174 lb 3.2 oz (79.017 kg)  BMI 32.93 kg/m2 General: Well developed white female in no acute distress Head: Normocephalic and atraumatic Eyes:  Sclerae anicteric, conjunctiva pink  Ears: Normal auditory acuity Lungs: Clear throughout to auscultation Heart: Regular rate and rhythm Abdomen: Soft, non-distended.  Normal bowel sounds.  Epigastric TTP without R/R/G. Musculoskeletal: Symmetrical with no gross deformities  Extremities: No edema  Neurological: Alert oriented x 4, grossly non-focal Psychological:  Alert and cooperative. Normal mood and affect  Assessment and Recommendations: -48 year old female with 4 week history of epigastric abdominal pain with nausea and vomiting, worse at night:  She does not have a gallbladder.  ? Ulcer disease, GERD, etc.  Will check labs including CBC, CMP, amylase, and lipase.  Will place her on pantoprazole 40 mg BID for now.  Will schedule EGD and abdominal ultrasound.  The risks, benefits, and  alternatives were discussed with the patient and she consents to proceed.

## 2014-05-15 ENCOUNTER — Ambulatory Visit (HOSPITAL_COMMUNITY)
Admission: RE | Admit: 2014-05-15 | Discharge: 2014-05-15 | Disposition: A | Discharge status: Home / Self Care | Payer: BC Managed Care – PPO | Source: Ambulatory Visit | Attending: Gastroenterology | Admitting: Gastroenterology

## 2014-05-15 DIAGNOSIS — R1013 Epigastric pain: Secondary | ICD-10-CM | POA: Insufficient documentation

## 2014-05-15 DIAGNOSIS — R112 Nausea with vomiting, unspecified: Secondary | ICD-10-CM | POA: Insufficient documentation

## 2014-05-15 DIAGNOSIS — Z9049 Acquired absence of other specified parts of digestive tract: Secondary | ICD-10-CM | POA: Insufficient documentation

## 2014-05-15 DIAGNOSIS — R1110 Vomiting, unspecified: Secondary | ICD-10-CM

## 2014-05-15 NOTE — Progress Notes (Signed)
Agree with Ms. Zehr's management.  Carl E. Gessner, MD, FACG  

## 2014-05-18 ENCOUNTER — Ambulatory Visit (AMBULATORY_SURGERY_CENTER): Payer: BC Managed Care – PPO | Admitting: Internal Medicine

## 2014-05-18 ENCOUNTER — Telehealth: Payer: Self-pay

## 2014-05-18 ENCOUNTER — Other Ambulatory Visit: Payer: Self-pay

## 2014-05-18 ENCOUNTER — Encounter: Payer: Self-pay | Admitting: Internal Medicine

## 2014-05-18 VITALS — BP 141/73 | HR 63 | Temp 97.2°F | Resp 26 | Ht 61.0 in | Wt 174.0 lb

## 2014-05-18 DIAGNOSIS — R1114 Bilious vomiting: Secondary | ICD-10-CM

## 2014-05-18 DIAGNOSIS — R112 Nausea with vomiting, unspecified: Secondary | ICD-10-CM

## 2014-05-18 DIAGNOSIS — R1084 Generalized abdominal pain: Secondary | ICD-10-CM

## 2014-05-18 DIAGNOSIS — R1013 Epigastric pain: Secondary | ICD-10-CM

## 2014-05-18 MED ORDER — ONDANSETRON HCL 4 MG PO TABS: 4.0000 mg | ORAL_TABLET | ORAL | Status: DC | PRN

## 2014-05-18 MED ORDER — SODIUM CHLORIDE 0.9 % IV SOLN: 500.0000 mL | INTRAVENOUS | Status: DC

## 2014-05-18 NOTE — Op Note (Signed)
**Note De-Identified Amy Obfuscation** [place]  [address] [place], [id number]   ENDOSCOPY PROCEDURE REPORT  PATIENT: Amy, Lang  MR#: [id number] BIRTHDATE: [birth year] , 89  yrs. old GENDER: female ENDOSCOPIST: Eustace Quail, MD REFERRED BY:  Creig Hines, M.D. PROCEDURE DATE:  05/18/2014 PROCEDURE:  EGD, diagnostic ASA CLASS:     Class II INDICATIONS:  nausea, vomiting, and epigastric pain. MEDICATIONS: Monitored anesthesia care and Propofol 200 mg IV TOPICAL ANESTHETIC: none  DESCRIPTION OF PROCEDURE: After the risks benefits and alternatives of the procedure were thoroughly explained, informed consent was obtained.  The LB [id number] endoscope was introduced through the mouth and advanced to the second portion of the duodenum , Without limitations.  The instrument was slowly withdrawn as the mucosa was fully examined.  EXAM: The esophagus and gastroesophageal junction were completely normal in appearance.  The stomach was entered and closely examined.The antrum, angularis, and lesser curvature were well visualized, including a retroflexed view of the cardia and fundus. The stomach wall was normally distensable.  The scope passed easily through the pylorus into the duodenum.  Retroflexed views revealed no abnormalities.     The scope was then withdrawn from the patient and the procedure completed.  COMPLICATIONS: There were no immediate complications.  ENDOSCOPIC IMPRESSION: 1. Normal EGD  RECOMMENDATIONS: 1.  Continue Pantoprazole for now 2.  My office will arrange for you to have a contrast enhanced CT scan of your abdomen and pelvis "abdominal pain, vomiting" 3.  Call for office visit to be seen in by Dr. Carlean Purl in a few weeks  REPEAT EXAM:  eSigned:  Eustace Quail, MD 05/18/2014 8:30 AM    JS:RPRX V.  Plotnikov, MD, Silvano Rusk, MD, and The Patient

## 2014-05-18 NOTE — Patient Instructions (Signed)
Discharge instructions given. Normal EGD. Office will arrange for a contrast enhanced CT scan of your abdomen and pelvis. Call office to see Dr. Carlean Purl in a few weeks. Resume previous medications. Contrast given to patient in recovery room. YOU HAD AN ENDOSCOPIC PROCEDURE TODAY AT [facility]: Refer to the procedure report that was given to you for any specific questions about what was found during the examination.  If the procedure report does not answer your questions, please call your gastroenterologist to clarify.  If you requested that your care partner not be given the details of your procedure findings, then the procedure report has been included in a sealed envelope for you to review at your convenience later.  YOU SHOULD EXPECT: Some feelings of bloating in the abdomen. Passage of more gas than usual.  Walking can help get rid of the air that was put into your GI tract during the procedure and reduce the bloating. If you had a lower endoscopy (such as a colonoscopy or flexible sigmoidoscopy) you may notice spotting of blood in your stool or on the toilet paper. If you underwent a bowel prep for your procedure, then you may not have a normal bowel movement for a few days.  DIET: Your first meal following the procedure should be a light meal and then it is ok to progress to your normal diet.  A half-sandwich or bowl of soup is an example of a good first meal.  Heavy or fried foods are harder to digest and may make you feel nauseous or bloated.  Likewise meals heavy in dairy and vegetables can cause extra gas to form and this can also increase the bloating.  Drink plenty of fluids but you should avoid alcoholic beverages for 24 hours.  ACTIVITY: Your care partner should take you home directly after the procedure.  You should plan to take it easy, moving slowly for the rest of the day.  You can resume normal activity the day after the procedure however you should NOT DRIVE or use  heavy machinery for 24 hours (because of the sedation medicines used during the test).    SYMPTOMS TO REPORT IMMEDIATELY: A gastroenterologist can be reached at any hour.  During normal business hours, 8:30 AM to 5:00 PM Monday through Friday, call [phone].  After hours and on weekends, please call the GI answering service at [phone] who will take a message and have the physician on call contact you.   Following upper endoscopy (EGD)  Vomiting of blood or coffee ground material  New chest pain or pain under the shoulder blades  Painful or persistently difficult swallowing  New shortness of breath  Fever of 100F or higher  Black, tarry-looking stools  FOLLOW UP: If any biopsies were taken you will be contacted by phone or by letter within the next 1-3 weeks.  Call your gastroenterologist if you have not heard about the biopsies in 3 weeks.  Our staff will call the home number listed on your records the next business day following your procedure to check on you and address any questions or concerns that you may have at that time regarding the information given to you following your procedure. This is a courtesy call and so if there is no answer at the home number and we have not heard from you through the emergency physician on call, we will assume that you have returned to your regular daily activities without incident.  SIGNATURES/CONFIDENTIALITY: You and/or your care  partner have signed paperwork which will be entered into your electronic medical record.  These signatures attest to the fact that that the information above on your After Visit Summary has been reviewed and is understood.  Full responsibility of the confidentiality of this discharge information lies with you and/or your care-partner.

## 2014-05-18 NOTE — Progress Notes (Signed)
Report to PACU, RN, vss, BBS= Clear.  

## 2014-05-18 NOTE — Telephone Encounter (Signed)
Pt scheduled for CT of abdomen and pelvis at [place] [facility] 05/25/14@10 :30am. Pt to be NPO after 6:30am, drink bottle 1 of contrast at 8:30 and bottle 2 at 9:30am. Left message for pt to call back.

## 2014-05-19 ENCOUNTER — Telehealth: Payer: Self-pay

## 2014-05-19 ENCOUNTER — Other Ambulatory Visit: Payer: Self-pay

## 2014-05-19 NOTE — Telephone Encounter (Signed)
Pt aware of appt.

## 2014-05-19 NOTE — Telephone Encounter (Signed)
  Follow up Call-  Call back number 05/18/2014 01/24/2013  Post procedure Call Back phone  # [phone]  Permission to leave phone message Yes Yes     Patient questions:  Do you have a fever, pain , or abdominal swelling? No. Pain Score  0 *  Have you tolerated food without any problems? Yes.    Have you been able to return to your normal activities? Yes.    Do you have any questions about your discharge instructions: Diet   No. Medications  No. Follow up visit  No.  Do you have questions or concerns about your Care? No.  Actions: * If pain score is 4 or above: No action needed, pain <4.

## 2014-05-19 NOTE — Telephone Encounter (Signed)
Pt scheduled for CT of A/P at [facility] CT 05/25/14@10 :30am. Pt to be NPO after 6:30am, drink bottle 1 of contrast at 8:30am and bottle 2 at 9:30am. Pt aware of appt.

## 2014-05-25 ENCOUNTER — Ambulatory Visit (INDEPENDENT_AMBULATORY_CARE_PROVIDER_SITE_OTHER)
Admission: RE | Admit: 2014-05-25 | Discharge: 2014-05-25 | Disposition: A | Discharge status: Home / Self Care | Payer: BC Managed Care – PPO | Source: Ambulatory Visit | Attending: Internal Medicine | Admitting: Internal Medicine

## 2014-05-25 DIAGNOSIS — R1084 Generalized abdominal pain: Secondary | ICD-10-CM

## 2014-05-25 DIAGNOSIS — R112 Nausea with vomiting, unspecified: Secondary | ICD-10-CM

## 2014-05-25 MED ORDER — IOHEXOL 300 MG/ML  SOLN
100.0000 mL | Freq: Once | INTRAMUSCULAR | Status: AC | PRN
  Administered 2014-05-25: 100 mL via INTRAVENOUS

## 2014-06-01 ENCOUNTER — Other Ambulatory Visit: Payer: Self-pay | Admitting: Internal Medicine

## 2014-06-08 ENCOUNTER — Encounter: Payer: Self-pay | Admitting: Internal Medicine

## 2014-06-08 ENCOUNTER — Ambulatory Visit (INDEPENDENT_AMBULATORY_CARE_PROVIDER_SITE_OTHER): Payer: BLUE CROSS/BLUE SHIELD | Admitting: Internal Medicine

## 2014-06-08 ENCOUNTER — Encounter: Payer: BC Managed Care – PPO | Admitting: Internal Medicine

## 2014-06-08 VITALS — BP 116/76 | HR 80 | Ht 61.0 in | Wt 174.4 lb

## 2014-06-08 DIAGNOSIS — K589 Irritable bowel syndrome without diarrhea: Secondary | ICD-10-CM

## 2014-06-08 DIAGNOSIS — R112 Nausea with vomiting, unspecified: Secondary | ICD-10-CM

## 2014-06-08 MED ORDER — ALOSETRON HCL 0.5 MG PO TABS: 0.5000 mg | ORAL_TABLET | Freq: Two times a day (BID) | ORAL | Status: DC

## 2014-06-08 NOTE — Assessment & Plan Note (Signed)
I think her overall situation is still most consistent with this and perhaps she's having some crampy abdominal pain from IBS and vomiting. The other possibility would be some sort of intermittent obstruction but we haven't seen any signs of that from the testing so far. At this point I have advised she start Lotronex 0.5 mg twice a day. I discussed the side effects and she is red that she had outlines this and the reasons to stop the medication i.e. should she get abdominal pain, constipation or bleeding. I will see her back in 2 months she is asked to call me sooner as needed in particular if she is worse or if she is not really getting better as we would need to adjust the medication if that were the case and either stop it or increase the dose.

## 2014-06-08 NOTE — Assessment & Plan Note (Signed)
I currently think that this is related to her irritable bowel syndrome and I am hopeful that the Lotronex will help this and her diarrhea and abdominal pain.

## 2014-06-08 NOTE — Progress Notes (Signed)
   Subjective:    Patient ID: Amy Lang, female    DOB: 1965/07/13, 49 y.o.   MRN: [id number]  HPI Patient is here for follow-up. She's been having epigastric pain and nausea and vomiting. This happens at night sometimes. Perhaps most often but not every night but nearly. My partner performed upper endoscopy in my absence and it was normal. Ultrasound is normal postcholecystectomy as his CT scan other than the left kidney stone that is within the kidney and hepatic steatosis. He continues with diarrhea predominant irritable bowel syndrome problems with urgent defecation including some urge incontinence. 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 She was offered Lotronex in 2014 but declined due to possible side effects. Wt Readings from Last 3 Encounters:  06/08/14 174 lb 6 oz (79.096 kg)  05/18/14 174 lb (78.926 kg)  05/14/14 174 lb 3.2 oz (79.017 kg)    Medications, allergies, past medical history, past surgical history, family history and social history are reviewed and updated in the EMR.   Review of Systems As above    Objective:   Physical Exam Develop well-nourished middle-aged white woman distress Abdomen soft nontender no organomegaly or mass     Assessment & Plan:  IBS (irritable bowel syndrome) I think her overall situation is still most consistent with this and perhaps she's having some crampy abdominal pain from IBS and vomiting. The other possibility would be some sort of intermittent obstruction but we haven't seen any signs of that from the testing so far. At this point I have advised she start Lotronex 0.5 mg twice a day. I discussed the side effects and she is red that she had outlines this and the reasons to stop the medication i.e. should she get abdominal pain, constipation or bleeding. I will see her back in 2 months she is asked to call me sooner as needed in  particular if she is worse or if she is not really getting better as we would need to adjust the medication if that were the case and either stop it or increase the dose.  Nausea with vomiting I currently think that this is related to her irritable bowel syndrome and I am hopeful that the Lotronex will help this and her diarrhea and abdominal pain.   I appreciate the opportunity to care for this patient.

## 2014-06-08 NOTE — Patient Instructions (Addendum)
Today you have been given a printed rx for Lotronex to take to the pharmacy.  We have also given you a copy of the signed consent.  Please let us know in a couple of weeks if this medicine is helping.  Follow up with Dr Carlean Purl in 2 months, Appointment made for March 7th at 11:30am.   I appreciate the opportunity to care for you. Silvano Rusk, M.D., [facility]

## 2014-06-12 ENCOUNTER — Telehealth: Payer: Self-pay | Admitting: Internal Medicine

## 2014-06-12 NOTE — Telephone Encounter (Signed)
I reviewed the test results and all ordered labs.  I explained that she should try lotronex as ordered by Dr. Carlean Purl

## 2014-08-03 ENCOUNTER — Ambulatory Visit: Payer: BLUE CROSS/BLUE SHIELD | Admitting: Internal Medicine

## 2014-09-28 ENCOUNTER — Other Ambulatory Visit: Payer: Self-pay | Admitting: Internal Medicine

## 2014-09-28 NOTE — Telephone Encounter (Signed)
Patient may need office visit, please advise, thanks

## 2014-10-01 NOTE — Telephone Encounter (Signed)
sch OV 

## 2014-10-02 NOTE — Telephone Encounter (Signed)
Left message advising patient to call back to schedule office visit in order to get refill for effexor

## 2014-10-05 ENCOUNTER — Other Ambulatory Visit: Payer: Self-pay | Admitting: Internal Medicine

## 2014-10-07 ENCOUNTER — Telehealth: Payer: Self-pay | Admitting: *Deleted

## 2014-10-07 NOTE — Telephone Encounter (Signed)
Venlafaxine ER 150 mg PA is approved from 10/05/14 until 10/06/15. Pharmacy informed.

## 2014-10-21 ENCOUNTER — Telehealth: Payer: Self-pay | Admitting: Internal Medicine

## 2014-10-21 NOTE — Telephone Encounter (Signed)
I can write a letter but if she never tried Lotronex she should.  If she is still having problems she should make and keep a follow-up.  Usually need Jury letter info to write it (copy of what she got)

## 2014-10-21 NOTE — Telephone Encounter (Signed)
Dr. Carlean Purl do you want to write a note?

## 2014-10-22 NOTE — Telephone Encounter (Signed)
Patient has not tried lotronex.  She will bring a copy of the jury summons by tomorrow She did not want to schedule an office visit at this time

## 2014-10-23 NOTE — Telephone Encounter (Signed)
A copy of the jury summons letter is on your desk in the front in-box.  Please create letter at your convenience

## 2014-10-27 ENCOUNTER — Encounter: Payer: Self-pay | Admitting: Internal Medicine

## 2014-10-27 NOTE — Telephone Encounter (Signed)
I left a message for the patient to come pick up the letter at her convenience

## 2014-11-03 ENCOUNTER — Other Ambulatory Visit: Payer: Self-pay | Admitting: Cardiology

## 2014-11-03 NOTE — Telephone Encounter (Signed)
Rx has been sent to the pharmacy electronically. ° °

## 2014-12-07 ENCOUNTER — Ambulatory Visit (INDEPENDENT_AMBULATORY_CARE_PROVIDER_SITE_OTHER): Payer: Self-pay | Admitting: Internal Medicine

## 2014-12-07 ENCOUNTER — Encounter: Payer: Self-pay | Admitting: Internal Medicine

## 2014-12-07 ENCOUNTER — Other Ambulatory Visit (INDEPENDENT_AMBULATORY_CARE_PROVIDER_SITE_OTHER): Payer: Self-pay

## 2014-12-07 VITALS — BP 140/84 | HR 76 | Ht 61.0 in | Wt 182.0 lb

## 2014-12-07 DIAGNOSIS — G43109 Migraine with aura, not intractable, without status migrainosus: Secondary | ICD-10-CM

## 2014-12-07 DIAGNOSIS — E538 Deficiency of other specified B group vitamins: Secondary | ICD-10-CM

## 2014-12-07 DIAGNOSIS — G500 Trigeminal neuralgia: Secondary | ICD-10-CM

## 2014-12-07 DIAGNOSIS — R1114 Bilious vomiting: Secondary | ICD-10-CM

## 2014-12-07 LAB — VITAMIN B12: Vitamin B-12: 172 pg/mL — ABNORMAL LOW (ref 211–911)

## 2014-12-07 MED ORDER — VITAMIN D3 50 MCG (2000 UT) PO CAPS: 2000.0000 [IU] | ORAL_CAPSULE | Freq: Every day | ORAL | Status: DC

## 2014-12-07 MED ORDER — ELETRIPTAN HYDROBROMIDE 40 MG PO TABS: ORAL_TABLET | ORAL | Status: DC

## 2014-12-07 MED ORDER — CYANOCOBALAMIN 1000 MCG/ML IJ SOLN: INTRAMUSCULAR | Status: DC

## 2014-12-07 MED ORDER — TOPIRAMATE 200 MG PO TABS: 200.0000 mg | ORAL_TABLET | Freq: Every day | ORAL | Status: DC

## 2014-12-07 MED ORDER — "SYRINGE/NEEDLE (DISP) 25G X 1"" 3 ML MISC": Status: DC

## 2014-12-07 MED ORDER — ONDANSETRON HCL 4 MG PO TABS: 4.0000 mg | ORAL_TABLET | ORAL | Status: DC | PRN

## 2014-12-07 MED ORDER — VENLAFAXINE HCL ER 150 MG PO CP24: 150.0000 mg | ORAL_CAPSULE | Freq: Two times a day (BID) | ORAL | Status: DC

## 2014-12-07 NOTE — Assessment & Plan Note (Signed)
Not using B12 Labs 

## 2014-12-07 NOTE — Assessment & Plan Note (Signed)
On Topamax 

## 2014-12-07 NOTE — Progress Notes (Signed)
Subjective:  Patient ID: Amy Lang, female    DOB: Jun 19, 1965  Age: 49 y.o. MRN: [id number]  CC: No chief complaint on file.   HPI Amy Lang presents for a well exam C/o L ear pain, popping (Dr Erik Obey) - washing hair, cold air - is triggering and aggravating (x years). H/o Vit B12 def, migraines. Pt has labs done w/Cardiology and w/gyn. We will re-sch well w/labs later  Outpatient Prescriptions Prior to Visit  Medication Sig Dispense Refill  . aspirin EC 81 MG tablet Take 81 mg by mouth every evening.     . cetirizine (ZYRTEC) 10 MG tablet Take 10 mg by mouth daily.     . fluticasone (FLONASE) 50 MCG/ACT nasal spray Place 2 sprays into the nose daily as needed.    [name] ibuprofen (ADVIL,MOTRIN) 600 MG tablet Take 1 tablet (600 mg total) by mouth every 6 (six) hours as needed (mild pain). 30 tablet 2  . losartan (COZAAR) 50 MG tablet Take 1 tablet (50 mg total) by mouth daily. Needs appointment before further refills 30 tablet 0  . Multiple Vitamin (MULTIVITAMIN WITH MINERALS) TABS Take 1 tablet by mouth daily.    . Multiple Vitamins-Minerals (PRESERVISION AREDS 2) CAPS Take 1 capsule by mouth at bedtime.    . pantoprazole (PROTONIX) 40 MG tablet Take 1 tablet (40 mg total) by mouth 2 (two) times daily. 90 tablet 3  . Probiotic Product (ALIGN) 4 MG CAPS Take 1 capsule by mouth daily. 30 capsule 1  . vitamin C (ASCORBIC ACID) 500 MG tablet Take 500 mg by mouth daily.     . ondansetron (ZOFRAN) 4 MG tablet Take 1 tablet (4 mg total) by mouth every 4 (four) hours as needed for nausea or vomiting. 30 tablet 1  . RELPAX 40 MG tablet TAKE 1 TABLET BY MOUTH TWICE A DAY AS NEEDED MAY REPEAT IN 2 HOURS IF NEEDED 10 tablet 0  . topiramate (TOPAMAX) 200 MG tablet TAKE 1 TABLET (200 MG TOTAL) BY MOUTH AT BEDTIME. 90 tablet 0  . venlafaxine XR (EFFEXOR-XR) 150 MG 24 hr capsule TAKE 2 CAPSULES (300 MG TOTAL) BY MOUTH DAILY WITH BREAKFAST. 180 capsule 0  . alosetron (LOTRONEX) 0.5 MG tablet Take  1 tablet (0.5 mg total) by mouth 2 (two) times daily. (Patient not taking: Reported on 12/07/2014) 60 tablet 2   No facility-administered medications prior to visit.    ROS Review of Systems  Objective:  BP 140/84 mmHg  Pulse 76  Ht 5\' 1"  (1.549 m)  Wt 182 lb (82.555 kg)  BMI 34.41 kg/m2  SpO2 97%  BP Readings from Last 3 Encounters:  12/07/14 140/84  06/08/14 116/76  05/18/14 141/73    Wt Readings from Last 3 Encounters:  12/07/14 182 lb (82.555 kg)  06/08/14 174 lb 6 oz (79.096 kg)  05/18/14 174 lb (78.926 kg)    Physical Exam  Lab Results  Component Value Date   WBC 6.1 05/14/2014   HGB 13.5 05/14/2014   HCT 40.8 05/14/2014   PLT 200.0 05/14/2014   GLUCOSE 98 05/14/2014   CHOL 224* 04/15/2012   TRIG 394.0* 04/15/2012   HDL 49.20 04/15/2012   LDLDIRECT 112.9 04/15/2012   LDLCALC 107* 07/09/2009   ALT 27 05/14/2014   AST 21 05/14/2014   NA 140 05/14/2014   K 4.1 05/14/2014   CL 109 05/14/2014   CREATININE 0.7 05/14/2014   BUN 15 05/14/2014   CO2 22 05/14/2014   TSH 1.02 04/15/2012  Ct Abdomen Pelvis W Contrast  05/25/2014   CLINICAL DATA:  Persistent epigastric pain which radiates into left site of abdomen and left lower quadrant.  EXAM: CT ABDOMEN AND PELVIS WITH CONTRAST  TECHNIQUE: Multidetector CT imaging of the abdomen and pelvis was performed using the standard protocol following bolus administration of intravenous contrast.  CONTRAST:  157mL OMNIPAQUE IOHEXOL 300 MG/ML  SOLN  COMPARISON:  05/12/2012  FINDINGS: Lower chest: There is no pleural effusion identified. The lung bases are clear.  Hepatobiliary: There is no suspicious liver abnormality. Previous cholecystectomy.  Pancreas: Appears normal.  Spleen: The spleen appears normal.  Adrenals/Urinary Tract: Normal adrenal glands. The right kidney is normal. The left kidney has a small stone in the upper pole which measures 3 mm, image 24/series 2. The urinary bladder appears normal.  Stomach/Bowel:  Normal appearance of the stomach. The small bowel loops have a normal course and caliber. No obstruction.  Vascular/Lymphatic: Normal appearance of the abdominal aorta. No enlarged retroperitoneal or mesenteric adenopathy. No enlarged pelvic or inguinal lymph nodes.  Reproductive: Previous hysterectomy.  No adnexal mass identified.  Other: There is no ascites or focal fluid collections within the abdomen or pelvis.  Musculoskeletal: Review of the visualized osseous structures is unremarkable.  IMPRESSION: 1. No acute findings. No explanation for epigastric and left lower quadrant pain. 2. Left renal calculus.   Electronically Signed   By: Kerby Moors M.D.   On: 05/25/2014 11:16    Assessment & Plan:   Diagnoses and all orders for this visit:  Bilious vomiting with nausea Orders: -     ondansetron (ZOFRAN) 4 MG tablet; Take 1 tablet (4 mg total) by mouth every 4 (four) hours as needed for nausea or vomiting.  B12 deficiency Orders: -     Vitamin B12; Future  Trigeminal neuralgia of left side of face  Migraine with aura and without status migrainosus, not intractable  Other orders -     eletriptan (RELPAX) 40 MG tablet; May repeat in 2 hours if headache persists or recurs. -     topiramate (TOPAMAX) 200 MG tablet; Take 1 tablet (200 mg total) by mouth daily. -     venlafaxine XR (EFFEXOR-XR) 150 MG 24 hr capsule; Take 1 capsule (150 mg total) by mouth 2 (two) times daily. -     cyanocobalamin (,VITAMIN B-12,) 1000 MCG/ML injection; Use 1 ml IM q day for 7 days, then 1 ml IM weekly for 1 month, then 1 ml IM every 2 weeks (lifetime) -     SYRINGE-NEEDLE, DISP, 3 ML (BD ECLIPSE SYRINGE) 25G X 1" 3 ML MISC; As directed   I have changed Amy Lang's RELPAX to eletriptan. I have also changed her topiramate and venlafaxine XR. I am also having her start on cyanocobalamin and SYRINGE-NEEDLE (DISP) 3 ML. Additionally, I am having her maintain her cetirizine, aspirin EC, vitamin C, multivitamin  with minerals, ibuprofen, ALIGN, fluticasone, PRESERVISION AREDS 2, pantoprazole, alosetron, losartan, and ondansetron.  Meds ordered this encounter  Medications  . eletriptan (RELPAX) 40 MG tablet    Sig: May repeat in 2 hours if headache persists or recurs.    Dispense:  10 tablet    Refill:  5  . topiramate (TOPAMAX) 200 MG tablet    Sig: Take 1 tablet (200 mg total) by mouth daily.    Dispense:  90 tablet    Refill:  3  . venlafaxine XR (EFFEXOR-XR) 150 MG 24 hr capsule    Sig: Take 1  capsule (150 mg total) by mouth 2 (two) times daily.    Dispense:  180 capsule    Refill:  3  . ondansetron (ZOFRAN) 4 MG tablet    Sig: Take 1 tablet (4 mg total) by mouth every 4 (four) hours as needed for nausea or vomiting.    Dispense:  30 tablet    Refill:  1  . cyanocobalamin (,VITAMIN B-12,) 1000 MCG/ML injection    Sig: Use 1 ml IM q day for 7 days, then 1 ml IM weekly for 1 month, then 1 ml IM every 2 weeks (lifetime)    Dispense:  10 mL    Refill:  11  . SYRINGE-NEEDLE, DISP, 3 ML (BD ECLIPSE SYRINGE) 25G X 1" 3 ML MISC    Sig: As directed    Dispense:  50 each    Refill:  3     Follow-up: Return in about 6 months (around 06/09/2015) for a follow-up visit.  Walker Kehr, MD

## 2014-12-07 NOTE — Progress Notes (Signed)
Pre visit review using our clinic review tool, if applicable. No additional management support is needed unless otherwise documented below in the visit note. 

## 2014-12-10 ENCOUNTER — Other Ambulatory Visit: Payer: Self-pay | Admitting: *Deleted

## 2014-12-10 ENCOUNTER — Other Ambulatory Visit: Payer: Self-pay | Admitting: Cardiology

## 2014-12-10 MED ORDER — CYANOCOBALAMIN 1000 MCG/ML IJ SOLN: INTRAMUSCULAR | Status: DC

## 2014-12-10 NOTE — Telephone Encounter (Signed)
Rx(s) sent to pharmacy electronically.  

## 2015-01-12 ENCOUNTER — Telehealth: Payer: Self-pay | Admitting: Internal Medicine

## 2015-01-12 NOTE — Telephone Encounter (Signed)
PLEASE NOTE: All timestamps contained within this report are represented as [place] Standard Time. CONFIDENTIALTY NOTICE: This fax transmission is intended only for the addressee. It contains information that is legally privileged, confidential or otherwise protected from use or disclosure. If you are not the intended recipient, you are strictly prohibited from reviewing, disclosing, copying using or disseminating any of this information or taking any action in reliance on or regarding this information. If you have received this fax in error, please notify us immediately by telephone so that we can arrange for its return to [name]. Phone: [phone], Toll-Free: [phone], Fax: [phone] Page: 1 of 1 Call Id: [id number] [place] Patient Name: Amy Lang DOB: 04-Feb-1966 Initial Comment Caller states she has a swollen lymph node on her neck that is about the size of a grape. It is painful when she turns her neck. Nurse Assessment Nurse: Malva Cogan, RN, Juliann Pulse Date/Time Eilene Ghazi Time): 01/12/2015 3:44:06 PM Confirm and document reason for call. If symptomatic, describe symptoms. ---Caller states that she noticed today that she has an enlarged lymph node in the back of her head in occipital area, had head & neck pain yesterday. Caller states that node is grape-sized & causes pain when she moves her neck. Has the patient traveled out of the country within the last 30 days? ---No Does the patient require triage? ---Yes Related visit to physician within the last 2 weeks? ---No Does the PT have any chronic conditions? (i.e. diabetes, asthma, etc.) ---Yes List chronic conditions. ---migraines Did the patient indicate they were pregnant? ---No Guidelines Guideline Title Affirmed Question Affirmed Notes Lymph Nodes Swollen [1] Single large node AND [2] size > 1 inch (2.5 cm) AND [3] no fever Final Disposition  User See Physician within [place], RN, [employer] Referrals REFERRED TO PCP OFFICE Disagree/Comply: Leta Baptist

## 2015-01-13 ENCOUNTER — Ambulatory Visit (INDEPENDENT_AMBULATORY_CARE_PROVIDER_SITE_OTHER): Payer: Self-pay | Admitting: Internal Medicine

## 2015-01-13 ENCOUNTER — Encounter: Payer: Self-pay | Admitting: Internal Medicine

## 2015-01-13 ENCOUNTER — Other Ambulatory Visit (INDEPENDENT_AMBULATORY_CARE_PROVIDER_SITE_OTHER): Payer: Self-pay

## 2015-01-13 VITALS — BP 136/84 | HR 79 | Temp 98.9°F | Resp 18 | Wt 185.0 lb

## 2015-01-13 DIAGNOSIS — R599 Enlarged lymph nodes, unspecified: Secondary | ICD-10-CM

## 2015-01-13 DIAGNOSIS — E538 Deficiency of other specified B group vitamins: Secondary | ICD-10-CM

## 2015-01-13 DIAGNOSIS — R232 Flushing: Secondary | ICD-10-CM

## 2015-01-13 DIAGNOSIS — R591 Generalized enlarged lymph nodes: Secondary | ICD-10-CM

## 2015-01-13 DIAGNOSIS — N951 Menopausal and female climacteric states: Secondary | ICD-10-CM

## 2015-01-13 LAB — CBC WITH DIFFERENTIAL/PLATELET
BASOS ABS: 0 10*3/uL (ref 0.0–0.1)
Basophils Relative: 0.4 % (ref 0.0–3.0)
EOS ABS: 0 10*3/uL (ref 0.0–0.7)
Eosinophils Relative: 0.2 % (ref 0.0–5.0)
HCT: 41.2 % (ref 36.0–46.0)
HEMOGLOBIN: 14 g/dL (ref 12.0–15.0)
LYMPHS ABS: 2.6 10*3/uL (ref 0.7–4.0)
Lymphocytes Relative: 36.1 % (ref 12.0–46.0)
MCHC: 33.9 g/dL (ref 30.0–36.0)
MCV: 88.3 fl (ref 78.0–100.0)
MONO ABS: 0.5 10*3/uL (ref 0.1–1.0)
Monocytes Relative: 7.6 % (ref 3.0–12.0)
NEUTROS PCT: 55.7 % (ref 43.0–77.0)
Neutro Abs: 4 10*3/uL (ref 1.4–7.7)
Platelets: 218 10*3/uL (ref 150.0–400.0)
RBC: 4.67 Mil/uL (ref 3.87–5.11)
RDW: 12.8 % (ref 11.5–15.5)
WBC: 7.2 10*3/uL (ref 4.0–10.5)

## 2015-01-13 LAB — VITAMIN B12: VITAMIN B 12: 334 pg/mL (ref 211–911)

## 2015-01-13 MED ORDER — DOXYCYCLINE HYCLATE 100 MG PO TABS: 100.0000 mg | ORAL_TABLET | Freq: Two times a day (BID) | ORAL | Status: DC

## 2015-01-13 NOTE — Patient Instructions (Addendum)
Can try Clonidine, Librax if needed for hot flashes

## 2015-01-13 NOTE — Assessment & Plan Note (Signed)
On Injections now

## 2015-01-13 NOTE — Progress Notes (Signed)
Pre visit review using our clinic review tool, if applicable. No additional management support is needed unless otherwise documented below in the visit note. 

## 2015-01-13 NOTE — Assessment & Plan Note (Addendum)
CBC Doxy x 7 d if not better FNA if problems

## 2015-01-13 NOTE — Assessment & Plan Note (Signed)
Can try Clonidine, Librax if needed for hot flashes

## 2015-01-13 NOTE — Progress Notes (Signed)
Subjective:  Patient ID: Amy Lang, female    DOB: [birth year]  Age: 49 y.o. MRN: [id number]  CC: No chief complaint on file.   HPI Amy Lang presents for a c/o swollen gland in the back of her neck, pain  Outpatient Prescriptions Prior to Visit  Medication Sig Dispense Refill  . alosetron (LOTRONEX) 0.5 MG tablet Take 1 tablet (0.5 mg total) by mouth 2 (two) times daily. 60 tablet 2  . aspirin EC 81 MG tablet Take 81 mg by mouth every evening.     . cetirizine (ZYRTEC) 10 MG tablet Take 10 mg by mouth daily.     . Cholecalciferol (VITAMIN D3) 2000 UNITS capsule Take 1 capsule (2,000 Units total) by mouth daily. 100 capsule 3  . cyanocobalamin (,VITAMIN B-12,) 1000 MCG/ML injection Use 1 ml IM q day for 7 days, then 1 ml IM weekly for 1 month, then 1 ml IM every 2 weeks (lifetime) 10 mL 11  . eletriptan (RELPAX) 40 MG tablet May repeat in 2 hours if headache persists or recurs. 10 tablet 5  . fluticasone (FLONASE) 50 MCG/ACT nasal spray Place 2 sprays into the nose daily as needed.    [name] ibuprofen (ADVIL,MOTRIN) 600 MG tablet Take 1 tablet (600 mg total) by mouth every 6 (six) hours as needed (mild pain). 30 tablet 2  . losartan (COZAAR) 50 MG tablet Take 1 tablet (50 mg total) by mouth daily. 30 tablet 4  . Multiple Vitamin (MULTIVITAMIN WITH MINERALS) TABS Take 1 tablet by mouth daily.    . Multiple Vitamins-Minerals (PRESERVISION AREDS 2) CAPS Take 1 capsule by mouth at bedtime.    . ondansetron (ZOFRAN) 4 MG tablet Take 1 tablet (4 mg total) by mouth every 4 (four) hours as needed for nausea or vomiting. 30 tablet 1  . pantoprazole (PROTONIX) 40 MG tablet Take 1 tablet (40 mg total) by mouth 2 (two) times daily. 90 tablet 3  . Probiotic Product (ALIGN) 4 MG CAPS Take 1 capsule by mouth daily. 30 capsule 1  . SYRINGE-NEEDLE, DISP, 3 ML (BD ECLIPSE SYRINGE) 25G X 1" 3 ML MISC As directed 50 each 3  . topiramate (TOPAMAX) 200 MG tablet Take 1 tablet (200 mg total) by mouth daily.  90 tablet 3  . venlafaxine XR (EFFEXOR-XR) 150 MG 24 hr capsule Take 1 capsule (150 mg total) by mouth 2 (two) times daily. 180 capsule 3  . vitamin C (ASCORBIC ACID) 500 MG tablet Take 500 mg by mouth daily.      No facility-administered medications prior to visit.    ROS Review of Systems  Constitutional: Negative for chills, activity change, appetite change, fatigue and unexpected weight change.  HENT: Negative for congestion, mouth sores and sinus pressure.   Eyes: Negative for visual disturbance.  Respiratory: Negative for cough and chest tightness.   Gastrointestinal: Negative for nausea and abdominal pain.  Genitourinary: Negative for frequency, difficulty urinating and vaginal pain.  Musculoskeletal: Negative for back pain and gait problem.  Skin: Negative for pallor, rash and wound.  Allergic/Immunologic: Negative for immunocompromised state.  Neurological: Negative for dizziness, tremors, weakness, numbness and headaches.  Psychiatric/Behavioral: Negative for confusion and sleep disturbance.    Objective:  BP 136/84 mmHg  Pulse 79  Temp(Src) 98.9 F (37.2 C) (Oral)  Resp 18  Wt 185 lb (83.915 kg)  SpO2 97%  BP Readings from Last 3 Encounters:  01/13/15 136/84  12/07/14 140/84  06/08/14 116/76    Wt Readings  from Last 3 Encounters:  01/13/15 185 lb (83.915 kg)  12/07/14 182 lb (82.555 kg)  06/08/14 174 lb 6 oz (79.096 kg)    Physical Exam  Constitutional: She appears well-developed. No distress.  HENT:  Head: Normocephalic.  Right Ear: External ear normal.  Left Ear: External ear normal.  Nose: Nose normal.  Mouth/Throat: Oropharynx is clear and moist.  Eyes: Conjunctivae are normal. Pupils are equal, round, and reactive to light. Right eye exhibits no discharge. Left eye exhibits no discharge.  Neck: Normal range of motion. Neck supple. No JVD present. No tracheal deviation present. No thyromegaly present.  Cardiovascular: Normal rate, regular rhythm and  normal heart sounds.   Pulmonary/Chest: No stridor. No respiratory distress. She has no wheezes.  Abdominal: Soft. Bowel sounds are normal. She exhibits no distension and no mass. There is no tenderness. There is no rebound and no guarding.  Musculoskeletal: She exhibits tenderness. She exhibits no edema.  Lymphadenopathy:    She has no cervical adenopathy.  Neurological: She displays normal reflexes. No cranial nerve deficit. She exhibits normal muscle tone. Coordination normal.  Skin: No rash noted. No erythema.  Psychiatric: She has a normal mood and affect. Her behavior is normal. Judgment and thought content normal.   Small, mobile, tender (<1cm) LN on L prox occip scalp  No other LNs  Lab Results  Component Value Date   WBC 7.2 01/13/2015   HGB 14.0 01/13/2015   HCT 41.2 01/13/2015   PLT 218.0 01/13/2015   GLUCOSE 98 05/14/2014   CHOL 224* 04/15/2012   TRIG 394.0* 04/15/2012   HDL 49.20 04/15/2012   LDLDIRECT 112.9 04/15/2012   LDLCALC 107* 07/09/2009   ALT 27 05/14/2014   AST 21 05/14/2014   NA 140 05/14/2014   K 4.1 05/14/2014   CL 109 05/14/2014   CREATININE 0.7 05/14/2014   BUN 15 05/14/2014   CO2 22 05/14/2014   TSH 1.02 04/15/2012    Ct Abdomen Pelvis W Contrast  05/25/2014   CLINICAL DATA:  Persistent epigastric pain which radiates into left site of abdomen and left lower quadrant.  EXAM: CT ABDOMEN AND PELVIS WITH CONTRAST  TECHNIQUE: Multidetector CT imaging of the abdomen and pelvis was performed using the standard protocol following bolus administration of intravenous contrast.  CONTRAST:  178mL OMNIPAQUE IOHEXOL 300 MG/ML  SOLN  COMPARISON:  05/12/2012  FINDINGS: Lower chest: There is no pleural effusion identified. The lung bases are clear.  Hepatobiliary: There is no suspicious liver abnormality. Previous cholecystectomy.  Pancreas: Appears normal.  Spleen: The spleen appears normal.  Adrenals/Urinary Tract: Normal adrenal glands. The right kidney is normal.  The left kidney has a small stone in the upper pole which measures 3 mm, image 24/series 2. The urinary bladder appears normal.  Stomach/Bowel: Normal appearance of the stomach. The small bowel loops have a normal course and caliber. No obstruction.  Vascular/Lymphatic: Normal appearance of the abdominal aorta. No enlarged retroperitoneal or mesenteric adenopathy. No enlarged pelvic or inguinal lymph nodes.  Reproductive: Previous hysterectomy.  No adnexal mass identified.  Other: There is no ascites or focal fluid collections within the abdomen or pelvis.  Musculoskeletal: Review of the visualized osseous structures is unremarkable.  IMPRESSION: 1. No acute findings. No explanation for epigastric and left lower quadrant pain. 2. Left renal calculus.   Electronically Signed   By: Kerby Moors M.D.   On: 05/25/2014 11:16    Assessment & Plan:   Diagnoses and all orders for this visit:  B12 deficiency -     CBC with Differential/Platelet; Future -     Vitamin B12; Future  Hot flashes -     CBC with Differential/Platelet; Future -     Vitamin B12; Future  Adenopathy -     CBC with Differential/Platelet; Future -     Vitamin B12; Future  Other orders -     doxycycline (VIBRA-TABS) 100 MG tablet; Take 1 tablet (100 mg total) by mouth 2 (two) times daily.  I am having Ms. Umana start on doxycycline. I am also having her maintain her cetirizine, aspirin EC, vitamin C, multivitamin with minerals, ibuprofen, ALIGN, fluticasone, PRESERVISION AREDS 2, pantoprazole, alosetron, eletriptan, topiramate, venlafaxine XR, ondansetron, SYRINGE-NEEDLE (DISP) 3 ML, Vitamin D3, losartan, and cyanocobalamin.  Meds ordered this encounter  Medications  . doxycycline (VIBRA-TABS) 100 MG tablet    Sig: Take 1 tablet (100 mg total) by mouth 2 (two) times daily.    Dispense:  20 tablet    Refill:  0     Follow-up: Return for a follow-up visit.  Walker Kehr, MD

## 2015-01-14 ENCOUNTER — Encounter: Payer: Self-pay | Admitting: Internal Medicine

## 2015-01-20 ENCOUNTER — Ambulatory Visit (INDEPENDENT_AMBULATORY_CARE_PROVIDER_SITE_OTHER): Payer: BLUE CROSS/BLUE SHIELD | Admitting: Cardiology

## 2015-01-20 VITALS — BP 126/90 | HR 79 | Ht 61.0 in | Wt 183.4 lb

## 2015-01-20 DIAGNOSIS — R42 Dizziness and giddiness: Secondary | ICD-10-CM

## 2015-01-20 DIAGNOSIS — Z79899 Other long term (current) drug therapy: Secondary | ICD-10-CM

## 2015-01-20 DIAGNOSIS — E669 Obesity, unspecified: Secondary | ICD-10-CM

## 2015-01-20 DIAGNOSIS — Z8249 Family history of ischemic heart disease and other diseases of the circulatory system: Secondary | ICD-10-CM

## 2015-01-20 DIAGNOSIS — R0602 Shortness of breath: Secondary | ICD-10-CM

## 2015-01-20 DIAGNOSIS — I10 Essential (primary) hypertension: Secondary | ICD-10-CM

## 2015-01-20 DIAGNOSIS — E785 Hyperlipidemia, unspecified: Secondary | ICD-10-CM

## 2015-01-20 NOTE — Patient Instructions (Signed)
No change with current medications  Labs- CMP,LIPID.- DO NOT EAT OR DRINK THE MORNING OF THE TEST.   Your physician wants you to follow-up in [place]. You will receive a reminder letter in the mail two months in advance. If you don't receive a letter, please call our office to schedule the follow-up appointment.

## 2015-01-20 NOTE — Progress Notes (Signed)
PCP: Walker Kehr, MD  Clinic Note: Chief Complaint  Patient presents with  . Follow-up    6 Months, pt denied chest pain and SOB, denied swellling in ankles and feet  . Hypertension  . Hyperlipidemia    HPI: Amy Lang is a 49 y.o. female with a PMH below who presents today for delayed annual followup for cardiac risk factor modification/management.  Amy Lang was last seen by me on 10/31/2013. She was doing quite well without any symptoms.stable blood pressure. Lipids had been ordered, but never were drawn. Plan was to continue diet and exercise. Unfortunately she has gained weight as noted below. Noted to have less dizziness despite having increased ARB dosing. She saw Cecilie Kicks, NP in November 2050  Recent Hospitalizations: none  Studies Reviewed: none  Interval History: She actually continues to do very well from a cardiac standpoint.she is a bit concerned about her weight gain. Essentially she has had to cut back her exercise because of increasing her work shifts since her husband was laid off from his job recently. For the last 3 months she has not been able to exercise, and has been "guilt eating."  Although why she denies any active cardiac symptoms noted below. No more dizziness or wooziness. Very rare palpitations.  Cardiovascular ROS: no chest pain or dyspnea on exertion positive for - palpitations negative for - edema, loss of consciousness, murmur, orthopnea, paroxysmal nocturnal dyspnea, rapid heart rate, shortness of breath or TIA/amaurosis fugax; near syncope   Past Medical History  Diagnosis Date  . Vaginal cancer   . Asthma   . Migraines   . Dyslipidemia   . Shortness of breath   . Obesity   . Anxiety   . Depression   . Allergy   . Hypertension   . IBS (irritable bowel syndrome)     Past Surgical History  Procedure Laterality Date  . Knee surgery    . Abdominal hysterectomy      still has bilateral ovaries  . Cholecystectomy  04-04-11     single site  . Laparotomy  05/12/2012    Procedure: LAPAROTOMY;  Surgeon: Cyril Mourning, MD;  Location: [facility];  Service: Gynecology;;  laparotomy [name] salpingectomy-oopharectomy  . Nm myocar perf wall motion  09/06/2009    normal  . Colonoscopy w/ biopsies  01/24/2013  . Transthoracic echocardiogram  March 2014    Normal regional wall motion. EF 55%. Mild MR  . Esophagogastroduodenoscopy      ROS: A comprehensive was performed. Review of Systems  Constitutional: Negative for weight loss (Weight gain) and malaise/fatigue.  HENT: Negative for nosebleeds.   Respiratory: Negative for cough, shortness of breath and wheezing.   Cardiovascular: Negative for claudication and leg swelling.  Gastrointestinal: Negative for blood in stool and melena.  Genitourinary: Negative for hematuria.  Musculoskeletal: Negative.   Neurological: Negative for dizziness, focal weakness and weakness.  Endo/Heme/Allergies: Does not bruise/bleed easily.  Psychiatric/Behavioral: Negative.   All other systems reviewed and are negative.   Prior to Admission medications   Medication Sig Start Date End Date Taking? Authorizing Provider  alosetron (LOTRONEX) 0.5 MG tablet Take 1 tablet (0.5 mg total) by mouth 2 (two) times daily. 06/08/14  Yes Gatha Mayer, MD  aspirin EC 81 MG tablet Take 81 mg by mouth every evening.    Yes Historical Provider, MD  cetirizine (ZYRTEC) 10 MG tablet Take 10 mg by mouth daily.    Yes Historical Provider, MD  Cholecalciferol (  VITAMIN D3) 2000 UNITS capsule Take 1 capsule (2,000 Units total) by mouth daily. 12/07/14  Yes Aleksei Plotnikov V, MD  cyanocobalamin (,VITAMIN B-12,) 1000 MCG/ML injection Use 1 ml IM q day for 7 days, then 1 ml IM weekly for 1 month, then 1 ml IM every 2 weeks (lifetime) 12/10/14  Yes Aleksei Plotnikov V, MD  doxycycline (VIBRA-TABS) 100 MG tablet Take 1 tablet (100 mg total) by mouth 2 (two) times daily. 01/13/15  Yes Aleksei Plotnikov V, MD    eletriptan (RELPAX) 40 MG tablet May repeat in 2 hours if headache persists or recurs. 12/07/14  Yes Aleksei Plotnikov V, MD  fluticasone (FLONASE) 50 MCG/ACT nasal spray Place 2 sprays into the nose daily as needed. 09/06/12  Yes Aleksei Plotnikov V, MD  ibuprofen (ADVIL,MOTRIN) 600 MG tablet Take 1 tablet (600 mg total) by mouth every 6 (six) hours as needed (mild pain). 05/13/12  Yes Marylynn Pearson, MD  losartan (COZAAR) 50 MG tablet Take 1 tablet (50 mg total) by mouth daily. 12/10/14  Yes Leonie Man, MD  Multiple Vitamin (MULTIVITAMIN WITH MINERALS) TABS Take 1 tablet by mouth daily.   Yes Historical Provider, MD  Multiple Vitamins-Minerals (PRESERVISION AREDS 2) CAPS Take 1 capsule by mouth at bedtime.   Yes Historical Provider, MD  ondansetron (ZOFRAN) 4 MG tablet Take 1 tablet (4 mg total) by mouth every 4 (four) hours as needed for nausea or vomiting. 12/07/14  Yes Aleksei Plotnikov V, MD  pantoprazole (PROTONIX) 40 MG tablet Take 1 tablet (40 mg total) by mouth 2 (two) times daily. 05/14/14  Yes Jessica D Zehr, PA-C  Probiotic Product (ALIGN) 4 MG CAPS Take 1 capsule by mouth daily. 12/23/12  Yes Aleksei Plotnikov V, MD  SYRINGE-NEEDLE, DISP, 3 ML (BD ECLIPSE SYRINGE) 25G X 1" 3 ML MISC As directed 12/07/14  Yes Aleksei Plotnikov V, MD  topiramate (TOPAMAX) 200 MG tablet Take 1 tablet (200 mg total) by mouth daily. 12/07/14  Yes Aleksei Plotnikov V, MD  venlafaxine XR (EFFEXOR-XR) 150 MG 24 hr capsule Take 1 capsule (150 mg total) by mouth 2 (two) times daily. 12/07/14  Yes Aleksei Plotnikov V, MD  vitamin C (ASCORBIC ACID) 500 MG tablet Take 500 mg by mouth daily.    Yes Historical Provider, MD   Allergies  Allergen Reactions  . Bee Pollen Anaphylaxis  . Butorphanol Tartrate Other (See Comments)    REACTION: hallucinations ( stadol)  . Codeine Sulfate Hives and Nausea And Vomiting  . Crestor [Rosuvastatin] Other (See Comments)    Body aches  . Darvocet [Propoxyphene N-Acetaminophen]  Hives and Nausea And Vomiting  . Estrogens   . Levofloxacin Other (See Comments)    abdominal pain  . Penicillins Other (See Comments)    Seizure   . Sulfa Antibiotics Hives and Nausea And Vomiting  . Latex Rash    Blisters and redness with bandaids-also sensitivity to latex gloves    Social History   Social History  . Marital Status: Married    Spouse Name: N/A  . Number of Children: N/A  . Years of Education: N/A   Social History Main Topics  . Smoking status: Never Smoker   . Smokeless tobacco: Never Used     Comment: Regular Exercise - No  . Alcohol Use: No  . Drug Use: No  . Sexual Activity: Yes     Comment: hysterectomy   Other Topics Concern  . None   Social History Narrative   [name] with  no children. Never smoked. Occasional alcohol.   Works as a Haematologist at [employer]. Is on her feet but 6 days per week.   She does a treadmill at home and either walks on [place] walking outside of these 3-4 times a week.   Family History  Problem Relation Age of Onset  . Heart disease Mother   . COPD Mother   . Lung cancer Father   . Heart disease Maternal Grandmother   . Heart disease Maternal Grandfather   . Heart disease Paternal Grandmother   . Heart disease Paternal Grandfather   . Colon cancer Neg Hx   . Esophageal cancer Neg Hx   . Rectal cancer Neg Hx   . Stomach cancer Neg Hx     Wt Readings from Last 3 Encounters:  01/20/15 183 lb 6.4 oz (83.19 kg)  01/13/15 185 lb (83.915 kg)  12/07/14 182 lb (82.555 kg)  10/2013 --> 170 lb  PHYSICAL EXAM BP 126/90 mmHg  Pulse 79  Ht 5\' 1"  (1.549 m)  Wt 183 lb 6.4 oz (83.19 kg)  BMI 34.67 kg/m2 General appearance: alert, cooperative, appears stated age, no distress and moderately obese  Neck: no adenopathy, no carotid bruit, no JVD and supple, symmetrical, trachea midline  Lungs: clear to auscultation bilaterally, normal percussion bilaterally and Nonlabored, and air movement  Heart:  normal apical impulse, regular rate and rhythm, S1, S2 normal, no S3 or S4 and Very faint 1/6 HSM at the left sternal border radiating to apex. No other R./G.  Abdomen: soft, NT/ND/NABS, No HSM; Mesomorphic habitus with exogenous obesity  Extremities: extremities normal, atraumatic, no cyanosis or edema  Pulses: 2+ and symmetric  Neurologic: A&OX 3, normal strength and tone. Normal symmetric reflexes. Normal coordination and gait Cranial nerves: normal (II-XII grossly intact)   Adult ECG Report  Rate: 79 ;  Rhythm: normal sinus rhythm and Normal axis, intervals and durations. Normal voltage.;   Narrative Interpretation: normal EKG   Other studies Reviewed: Additional studies/ records that were reviewed today include:  Recent Labs:  None available Lab Results  Component Value Date   CHOL 224* 04/15/2012   HDL 49.20 04/15/2012   LDLCALC 107* 07/09/2009   LDLDIRECT 112.9 04/15/2012   TRIG 394.0* 04/15/2012   CHOLHDL 5 04/15/2012    ASSESSMENT / PLAN: Problem List Items Addressed This Visit    Dizziness    Resolved. Reiterated importance of staying hydrated to avoid hypotension      DYSPNEA   Essential hypertension - Primary (Chronic)    Well-controlled.  Continue current dose of ARB      Relevant Orders   EKG 12-Lead (Completed)   Lipid panel   Comprehensive metabolic panel   Family history of coronary artery disease (Chronic)   Hyperlipidemia (Chronic)    No recent labs checked. On fish oil but with statin intolerance. Goal LDL should be close to 100. Again we'll try to reorder her lipids and LFTs. Think initial option would probably be to consider using Zetia if necessary.      Relevant Orders   EKG 12-Lead (Completed)   Lipid panel   Comprehensive metabolic panel   Obesity (BMI 30-39.9) (Chronic)    Unfortunately,she has resorted back to her prior eating habits and is not exercising. Has put back on weight she previously lost. Hopefully once her work schedule  is stabilized and her husband is able to go back to work, she can start losing weight again       Other  Visit Diagnoses    Drug therapy        Relevant Orders    Lipid panel    Comprehensive metabolic panel       Current medicines are reviewed at length with the patient today. (+/- concerns) n/a The following changes have been made: n/a Studies Ordered:   Orders Placed This Encounter  Procedures  . Lipid panel  . Comprehensive metabolic panel  . EKG 12-Lead   No change with current medications  Labs- CMP,LIPID.- DO NOT EAT OR DRINK THE MORNING OF THE TEST.   Follow-up in [place] Oak[place]nn Stierwalt.    Leonie Man, M.D., M.S. Interventional Cardiologist   Pager # [phone]

## 2015-01-21 ENCOUNTER — Encounter: Payer: Self-pay | Admitting: Cardiology

## 2015-01-21 NOTE — Assessment & Plan Note (Signed)
Well-controlled.  Continue current dose of ARB

## 2015-01-21 NOTE — Assessment & Plan Note (Signed)
Unfortunately,she has resorted back to her prior eating habits and is not exercising. Has put back on weight she previously lost. Hopefully once her work schedule is stabilized and her husband is able to go back to work, she can start losing weight again

## 2015-01-21 NOTE — Assessment & Plan Note (Addendum)
No recent labs checked. On fish oil but with statin intolerance. Goal LDL should be close to 100. Again we'll try to reorder her lipids and LFTs. Think initial option would probably be to consider using Zetia if necessary.

## 2015-01-21 NOTE — Assessment & Plan Note (Signed)
Resolved. Reiterated importance of staying hydrated to avoid hypotension

## 2015-01-25 ENCOUNTER — Ambulatory Visit: Payer: BLUE CROSS/BLUE SHIELD | Admitting: Internal Medicine

## 2015-02-12 ENCOUNTER — Telehealth: Payer: Self-pay | Admitting: Internal Medicine

## 2015-02-12 NOTE — Telephone Encounter (Signed)
[facility] is calling for clarification on venlafaxine XR (EFFEXOR-XR) 150 MG 24 hr capsule [[id number]]  Sig states 150mg  2x/day. Max dose according to their pharma advisor is 200mg  per day. Please clarify to them,

## 2015-02-12 NOTE — Telephone Encounter (Signed)
Called pharmacy back spoke with pharmacist Erv inform md ok to fill pt has been taking med since Feb 2011 for Depression...Amy Lang

## 2015-04-05 ENCOUNTER — Ambulatory Visit: Payer: Self-pay | Admitting: Internal Medicine

## 2015-06-17 ENCOUNTER — Other Ambulatory Visit: Payer: Self-pay

## 2015-06-17 DIAGNOSIS — Z1231 Encounter for screening mammogram for malignant neoplasm of breast: Secondary | ICD-10-CM

## 2015-07-06 ENCOUNTER — Other Ambulatory Visit: Payer: Self-pay | Admitting: Cardiology

## 2015-07-06 NOTE — Telephone Encounter (Signed)
Rx request sent to pharmacy.  

## 2015-07-07 ENCOUNTER — Other Ambulatory Visit: Payer: Self-pay | Admitting: Gastroenterology

## 2015-07-12 ENCOUNTER — Ambulatory Visit
Admission: RE | Admit: 2015-07-12 | Discharge: 2015-07-12 | Disposition: A | Discharge status: Home / Self Care | Payer: No Typology Code available for payment source | Source: Ambulatory Visit

## 2015-07-12 DIAGNOSIS — Z1231 Encounter for screening mammogram for malignant neoplasm of breast: Secondary | ICD-10-CM

## 2015-08-13 ENCOUNTER — Telehealth: Payer: Self-pay

## 2015-08-13 NOTE — Telephone Encounter (Signed)
LVM for pt to call back as soon as possible.   Re: Flu Vaccine 2016-2017 

## 2015-08-23 ENCOUNTER — Telehealth: Payer: Self-pay | Admitting: Internal Medicine

## 2015-08-23 MED ORDER — PANTOPRAZOLE SODIUM 40 MG PO TBEC: 40.0000 mg | DELAYED_RELEASE_TABLET | Freq: Two times a day (BID) | ORAL | Status: DC

## 2015-08-23 NOTE — Telephone Encounter (Signed)
Refill sent in as requested. 

## 2015-10-29 ENCOUNTER — Encounter: Payer: Self-pay | Admitting: Internal Medicine

## 2015-10-29 ENCOUNTER — Ambulatory Visit (INDEPENDENT_AMBULATORY_CARE_PROVIDER_SITE_OTHER): Payer: Self-pay | Admitting: Internal Medicine

## 2015-10-29 VITALS — BP 134/84 | HR 72 | Ht 61.0 in | Wt 176.4 lb

## 2015-10-29 DIAGNOSIS — K589 Irritable bowel syndrome without diarrhea: Secondary | ICD-10-CM

## 2015-10-29 DIAGNOSIS — R1114 Bilious vomiting: Secondary | ICD-10-CM

## 2015-10-29 DIAGNOSIS — K219 Gastro-esophageal reflux disease without esophagitis: Secondary | ICD-10-CM

## 2015-10-29 MED ORDER — ONDANSETRON HCL 4 MG PO TABS: 4.0000 mg | ORAL_TABLET | ORAL | Status: DC | PRN

## 2015-10-29 MED ORDER — PANTOPRAZOLE SODIUM 20 MG PO TBEC: 20.0000 mg | DELAYED_RELEASE_TABLET | Freq: Every day | ORAL | Status: DC

## 2015-10-29 NOTE — Progress Notes (Signed)
   Subjective:    Patient ID: Amy Lang, female    DOB: 1966-02-08, 50 y.o.   MRN: [id number] Cc: f/u IBS and GERD HPI Doing well - occasional diarrhea - manages. No heartburn or epigastric pain at this time. Requesting refills on PPI  Medications, allergies, past medical history, past surgical history, family history and social history are reviewed and updated in the EMR.   Review of Systems Intermittent migraines - ondansetron helps with associated vomiting    Objective:   Physical Exam BP 134/84 mmHg  Pulse 72  Ht 5\' 1"  (1.549 m)  Wt 176 lb 6.4 oz (80.015 kg)  BMI 33.35 kg/m2 NAD     Assessment & Plan:   Encounter Diagnoses  Name Primary?  . Gastroesophageal reflux disease without esophagitis Yes  . IBS (irritable bowel syndrome)   . Bilious vomiting with nausea    Try to reduce PPI dose - to 20 mg pantoprazole daily. Advised could be some heartburn flare at first. If does not work back to 40 mg.  Try ondansetron prn diarrhea and for nausea and vomiting with headaches.  Stay on align  15 minutes time spent with patient > half in counseling coordination of care  I appreciate the opportunity to care for this patient. [id number] Plotnikov, MD

## 2015-10-29 NOTE — Patient Instructions (Addendum)
   Decrease your pantoprazole to 20mg  daily, rx sent to pharmacy.   We have sent the following medications to your pharmacy for you to pick up at your convenience: Generic zofran   Follow up with Dr Carlean Purl in 2 years.    I appreciate the opportunity to care for you. Silvano Rusk, MD, [facility]

## 2016-01-04 ENCOUNTER — Telehealth: Payer: Self-pay | Admitting: Internal Medicine

## 2016-01-04 NOTE — Telephone Encounter (Signed)
Patient advised that he had prescribed zofran.  She will check with her pharmacy about picking that up

## 2016-01-11 ENCOUNTER — Other Ambulatory Visit: Payer: Self-pay | Admitting: Internal Medicine

## 2016-03-20 ENCOUNTER — Other Ambulatory Visit: Payer: Self-pay | Admitting: Internal Medicine

## 2016-03-20 ENCOUNTER — Other Ambulatory Visit: Payer: Self-pay | Admitting: Cardiology

## 2016-03-20 NOTE — Telephone Encounter (Signed)
Rx has been sent to the pharmacy electronically. ° °

## 2016-04-24 ENCOUNTER — Other Ambulatory Visit: Payer: Self-pay | Admitting: Cardiology

## 2016-04-24 ENCOUNTER — Other Ambulatory Visit: Payer: Self-pay | Admitting: Internal Medicine

## 2016-04-24 NOTE — Telephone Encounter (Signed)
REFILL 

## 2016-04-24 NOTE — Telephone Encounter (Signed)
Routing to dr plotnikov, please advise, thanks 

## 2016-05-25 ENCOUNTER — Other Ambulatory Visit: Payer: Self-pay | Admitting: Cardiology

## 2016-05-25 ENCOUNTER — Other Ambulatory Visit: Payer: Self-pay | Admitting: Internal Medicine

## 2016-05-25 NOTE — Telephone Encounter (Signed)
Can have 30 day supply but needs visit as none in 1 year.

## 2016-05-25 NOTE — Telephone Encounter (Signed)
Routing to dr crawford---patients last office visit to address this issue was 11/2014---are you ok with refilling or do we need office visit---please advise, thanks

## 2016-05-26 NOTE — Telephone Encounter (Signed)
Advised patient of dr crawfords instructions

## 2016-06-26 ENCOUNTER — Ambulatory Visit: Payer: Self-pay | Admitting: Cardiology

## 2016-07-05 ENCOUNTER — Other Ambulatory Visit: Payer: Self-pay | Admitting: Internal Medicine

## 2016-07-17 ENCOUNTER — Other Ambulatory Visit: Payer: Self-pay | Admitting: Cardiology

## 2016-07-18 NOTE — Telephone Encounter (Signed)
Rx(s) sent to pharmacy electronically.  

## 2016-07-20 ENCOUNTER — Encounter: Payer: Self-pay | Admitting: Cardiology

## 2016-07-24 ENCOUNTER — Ambulatory Visit: Payer: Self-pay | Admitting: Cardiology

## 2016-07-31 ENCOUNTER — Encounter: Payer: Self-pay | Admitting: Cardiology

## 2016-07-31 ENCOUNTER — Ambulatory Visit (INDEPENDENT_AMBULATORY_CARE_PROVIDER_SITE_OTHER): Payer: Self-pay | Admitting: Cardiology

## 2016-07-31 VITALS — BP 162/84 | HR 84 | Ht 61.0 in | Wt 182.0 lb

## 2016-07-31 DIAGNOSIS — I10 Essential (primary) hypertension: Secondary | ICD-10-CM

## 2016-07-31 DIAGNOSIS — E669 Obesity, unspecified: Secondary | ICD-10-CM

## 2016-07-31 DIAGNOSIS — E785 Hyperlipidemia, unspecified: Secondary | ICD-10-CM

## 2016-07-31 DIAGNOSIS — Z8249 Family history of ischemic heart disease and other diseases of the circulatory system: Secondary | ICD-10-CM

## 2016-07-31 MED ORDER — LOSARTAN POTASSIUM 50 MG PO TABS: 50.0000 mg | ORAL_TABLET | Freq: Every day | ORAL | 3 refills | Status: DC

## 2016-07-31 NOTE — Patient Instructions (Addendum)
Medication Instructions:  1) RESTART COZAAR 50 mg daily  Labwork: FASTING LABS the same day as your HTN Clinic visit.  Testing/Procedures: None  Follow-Up: You have a HTN CLINIC appointment Thursday, March 15 at 8:30 AM. Please come FASTING to this appointment as you will have your cholesterol checked as well.  Your physician wants you to follow-up in: 1 year with Dr. Ellyn Hack. You will receive a reminder letter in the mail two months in advance. If you don't receive a letter, please call our office to schedule the follow-up appointment.   Any Other Special Instructions Will Be Listed Below (If Applicable). Please try to ride your bike in the mornings. Start off at 2 minutes and work your way up to 15 minutes daily.    If you need a refill on your cardiac medications before your next appointment, please call your pharmacy.

## 2016-07-31 NOTE — Progress Notes (Signed)
Cardiology Office Note   Date:  07/31/2016   ID:  Amy Lang, DOB [birth year], MRN [id number]  PCP:  Walker Kehr, MD  Cardiologist:  Dr. Roni Bread    Chief Complaint  Patient presents with  . Hypertension      History of Present Illness: Amy Lang is a 51 y.o. female who presents for HTN and family hx of premature CAD with mother at 35  Plan was to continue diet and exercise. Unfortunately she has gained weight as noted below. Noted to have less dizziness despite having increased ARB dosing. Last saw Dr. Ellyn Hack in 12/2014.  Has now run out of her BP meds. And now BP elevated.    No chest pain or SOB.  Still with lots of stress at home.  No time for exercise though she did buy standing bike just has not yet used.  We discussed best time for her to exercise.  To begin with only 2 min.     Past Medical History:  Diagnosis Date  . Allergy   . Anxiety   . Asthma   . Depression   . Dyslipidemia   . Hypertension   . IBS (irritable bowel syndrome)   . Migraines   . Obesity   . Shortness of breath   . Vaginal cancer [facility])     Past Surgical History:  Procedure Laterality Date  . ABDOMINAL HYSTERECTOMY     still has bilateral ovaries  . CHOLECYSTECTOMY  04-04-11   single site  . COLONOSCOPY W/ BIOPSIES  01/24/2013  . ESOPHAGOGASTRODUODENOSCOPY    . KNEE SURGERY    . LAPAROTOMY  05/12/2012   Procedure: LAPAROTOMY;  Surgeon: Cyril Mourning, MD;  Location: [facility];  Service: Gynecology;;  laparotomy [name] salpingectomy-oopharectomy  . NM MYOCAR PERF WALL MOTION  09/06/2009   normal  . TRANSTHORACIC ECHOCARDIOGRAM  March 2014   Normal regional wall motion. EF 55%. Mild MR     Current Outpatient Prescriptions  Medication Sig Dispense Refill  . aspirin EC 81 MG tablet Take 81 mg by mouth every evening.     . cetirizine (ZYRTEC) 10 MG tablet Take 10 mg by mouth daily.     . Cholecalciferol (VITAMIN D3) 2000 UNITS capsule Take 1 capsule (2,000 Units total)  by mouth daily. 100 capsule 3  . cyanocobalamin (,VITAMIN B-12,) 1000 MCG/ML injection Use 1 ml IM q day for 7 days, then 1 ml IM weekly for 1 month, then 1 ml IM every 2 weeks (lifetime) 10 mL 11  . eletriptan (RELPAX) 40 MG tablet May repeat in 2 hours if headache persists or recurs. 10 tablet 5  . fluticasone (FLONASE) 50 MCG/ACT nasal spray Place 2 sprays into the nose daily as needed.    [name] ibuprofen (ADVIL,MOTRIN) 600 MG tablet Take 1 tablet (600 mg total) by mouth every 6 (six) hours as needed (mild pain). 30 tablet 2  . losartan (COZAAR) 50 MG tablet Take 1 tablet (50 mg total) by mouth daily. 90 tablet 3  . Multiple Vitamin (MULTIVITAMIN WITH MINERALS) TABS Take 1 tablet by mouth daily.    . Multiple Vitamins-Minerals (PRESERVISION AREDS 2) CAPS Take 1 capsule by mouth at bedtime.    . ondansetron (ZOFRAN) 4 MG tablet Take 1 tablet (4 mg total) by mouth every 4 (four) hours as needed for nausea or vomiting. And diarrhea 90 tablet 3  . pantoprazole (PROTONIX) 20 MG tablet Take 1 tablet (20 mg total) by mouth daily before breakfast.  90 tablet 3  . Probiotic Product (ALIGN) 4 MG CAPS Take 1 capsule by mouth daily. 30 capsule 1  . SYRINGE-NEEDLE, DISP, 3 ML (BD ECLIPSE SYRINGE) 25G X 1" 3 ML MISC As directed 50 each 3  . topiramate (TOPAMAX) 200 MG tablet TAKE 1 TABLET BY MOUTH DAILY 30 tablet 0  . venlafaxine XR (EFFEXOR-XR) 150 MG 24 hr capsule TAKE 1 CAPSULE BY MOUTH TWICE A DAY 60 capsule 0  . vitamin C (ASCORBIC ACID) 500 MG tablet Take 500 mg by mouth daily.      No current facility-administered medications for this visit.     Allergies:   Bee pollen; Butorphanol tartrate; Codeine sulfate; Crestor [rosuvastatin]; Darvocet [propoxyphene n-acetaminophen]; Estrogens; Levofloxacin; Penicillins; Sulfa antibiotics; and Latex    Social History:  The patient  reports that she has never smoked. She has never used smokeless tobacco. She reports that she does not drink alcohol or use drugs.    Family History:  The patient's family history includes COPD in her mother; Heart disease in her maternal grandfather, maternal grandmother, mother, paternal grandfather, and paternal grandmother; Lung cancer in her father.  Mother with MI at 24 yrs. Died at 50.   ROS:  General:no colds or fevers, no weight changes Skin:no rashes or ulcers HEENT:no blurred vision, no congestion CV:see HPI PUL:see HPI GI:no diarrhea constipation or melena, no indigestion GU:no hematuria, no dysuria MS:no joint pain, no claudication Neuro:no syncope, no lightheadedness Endo:no diabetes, no thyroid disease  Wt Readings from Last 3 Encounters:  07/31/16 182 lb (82.6 kg)  10/29/15 176 lb 6.4 oz (80 kg)  01/20/15 183 lb 6.4 oz (83.2 kg)     PHYSICAL EXAM: VS:  BP (!) 162/84   Pulse 84   Ht 5\' 1"  (1.549 m)   Wt 182 lb (82.6 kg)   BMI 34.39 kg/m  , BMI Body mass index is 34.39 kg/m. General:Pleasant affect, NAD Skin:Warm and dry, brisk capillary refill HEENT:normocephalic, sclera clear, mucus membranes moist Neck:supple, no JVD, no bruits  Heart:S1S2 RRR without murmur, gallup, rub or click Lungs:clear without rales, rhonchi, or wheezes [id number], non tender, + BS, do not palpate liver spleen or masses Ext:no lower ext edema, 2+ pedal pulses, 2+ radial pulses Neuro:alert and oriented X 3, MAE, follows commands, + facial symmetry    EKG:  EKG is ordered today. The ekg ordered today demonstrates SR normal EKG   Recent Labs: No results found for requested labs within last 8760 hours.    Lipid Panel    Component Value Date/Time   CHOL 224 (H) 04/15/2012 1207   TRIG 394.0 (H) 04/15/2012 1207   HDL 49.20 04/15/2012 1207   CHOLHDL 5 04/15/2012 1207   VLDL 78.8 (H) 04/15/2012 1207   LDLCALC 107 (H) 07/09/2009 1055   LDLDIRECT 112.9 04/15/2012 1207       Other studies Reviewed: Additional studies/ records that were reviewed today include: . Echo: 07/2012 Study Conclusions  - Left  ventricle: The cavity size was normal. Wall thickness was normal. Systolic function was normal. The estimated ejection fraction was in the range of 55% to 60%. Wall motion was normal; there were no regional wall motion abnormalities. Left ventricular diastolic function parameters were normal. - Mitral valve: Mild regurgitation. - Atrial septum: No defect or patent foramen ovale was identified.  ASSESSMENT AND PLAN:  1.  Premature FH CAD- check lipids.   2. HTN uncontrolled out of cozaar resume at 50 mg daily.  Recheck bp in 1-2 weeks, and  will check lipids and hepatic at that time as well. Then follow up with Dr. Roni Bread in 1 year.   3. Obesity discussed exercise, she has a recumbent bike will start with 2 min of bike every AM and increase to 15 min over several weeks.       Current medicines are reviewed with the patient today.  The patient Has no concerns regarding medicines.  The following changes have been made:  See above Labs/ tests ordered today include:see above  Disposition:   FU:  see above  Signed, Cecilie Kicks, NP  07/31/2016 2:02 PM    [facility] [place] [place], [place] Phone: [phone]; Fax: [phone]

## 2016-08-07 ENCOUNTER — Encounter: Payer: Self-pay | Admitting: Internal Medicine

## 2016-08-10 ENCOUNTER — Ambulatory Visit: Payer: Self-pay

## 2016-08-10 ENCOUNTER — Other Ambulatory Visit: Payer: Self-pay

## 2016-08-10 NOTE — Progress Notes (Deleted)
Patient ID: Amy Lang                 DOB: [birth year]                      MRN: [id number]     HPI: Amy Lang is a 51 y.o. female patient of Dr Ellyn Hack referred by Cecilie Kicks, NP to HTN clinic. PMH is significant for HTN, HLD, obesity, and family history of premature CAD. At office visit one week ago, pt had run out of her BP medications and her BP was elevated at 162/84.  Taking ibuprofen? Using bike at home?  Increase losartan, add amlodipine  Current HTN meds: losartan 50mg  daily BP goal: <130/104mmHg  Family History: The patient's family history includes COPD in her mother; Heart disease in her maternal grandfather, maternal grandmother, mother, paternal grandfather, and paternal grandmother; Lung cancer in her father.  Mother with MI at 80 yrs. Died at 24.  Social History: The patient  reports that she has never smoked. She has never used smokeless tobacco. She reports that she does not drink alcohol or use drugs.   Diet:   Exercise:   Home BP readings:   Wt Readings from Last 3 Encounters:  07/31/16 182 lb (82.6 kg)  10/29/15 176 lb 6.4 oz (80 kg)  01/20/15 183 lb 6.4 oz (83.2 kg)   BP Readings from Last 3 Encounters:  07/31/16 (!) 162/84  10/29/15 134/84  01/20/15 126/90   Pulse Readings from Last 3 Encounters:  07/31/16 84  10/29/15 72  01/20/15 79    Renal function: CrCl cannot be calculated (Patient's most recent lab result is older than the maximum 21 days allowed.).  Past Medical History:  Diagnosis Date  . Allergy   . Anxiety   . Asthma   . Depression   . Dyslipidemia   . Hypertension   . IBS (irritable bowel syndrome)   . Migraines   . Obesity   . Shortness of breath   . Vaginal cancer [facility])     Current Outpatient Prescriptions on File Prior to Visit  Medication Sig Dispense Refill  . aspirin EC 81 MG tablet Take 81 mg by mouth every evening.     . cetirizine (ZYRTEC) 10 MG tablet Take 10 mg by mouth daily.     . Cholecalciferol  (VITAMIN D3) 2000 UNITS capsule Take 1 capsule (2,000 Units total) by mouth daily. 100 capsule 3  . cyanocobalamin (,VITAMIN B-12,) 1000 MCG/ML injection Use 1 ml IM q day for 7 days, then 1 ml IM weekly for 1 month, then 1 ml IM every 2 weeks (lifetime) 10 mL 11  . eletriptan (RELPAX) 40 MG tablet May repeat in 2 hours if headache persists or recurs. 10 tablet 5  . fluticasone (FLONASE) 50 MCG/ACT nasal spray Place 2 sprays into the nose daily as needed.    [name] ibuprofen (ADVIL,MOTRIN) 600 MG tablet Take 1 tablet (600 mg total) by mouth every 6 (six) hours as needed (mild pain). 30 tablet 2  . losartan (COZAAR) 50 MG tablet Take 1 tablet (50 mg total) by mouth daily. 90 tablet 3  . Multiple Vitamin (MULTIVITAMIN WITH MINERALS) TABS Take 1 tablet by mouth daily.    . Multiple Vitamins-Minerals (PRESERVISION AREDS 2) CAPS Take 1 capsule by mouth at bedtime.    . ondansetron (ZOFRAN) 4 MG tablet Take 1 tablet (4 mg total) by mouth every 4 (four) hours as needed for nausea or  vomiting. And diarrhea 90 tablet 3  . pantoprazole (PROTONIX) 20 MG tablet Take 1 tablet (20 mg total) by mouth daily before breakfast. 90 tablet 3  . Probiotic Product (ALIGN) 4 MG CAPS Take 1 capsule by mouth daily. 30 capsule 1  . SYRINGE-NEEDLE, DISP, 3 ML (BD ECLIPSE SYRINGE) 25G X 1" 3 ML MISC As directed 50 each 3  . topiramate (TOPAMAX) 200 MG tablet TAKE 1 TABLET BY MOUTH DAILY 30 tablet 0  . venlafaxine XR (EFFEXOR-XR) 150 MG 24 hr capsule TAKE 1 CAPSULE BY MOUTH TWICE A DAY 60 capsule 0  . vitamin C (ASCORBIC ACID) 500 MG tablet Take 500 mg by mouth daily.      No current facility-administered medications on file prior to visit.     Allergies  Allergen Reactions  . Bee Pollen Anaphylaxis  . Butorphanol Tartrate Other (See Comments)    REACTION: hallucinations ( stadol)  . Codeine Sulfate Hives and Nausea And Vomiting  . Crestor [Rosuvastatin] Other (See Comments)    Body aches  . Darvocet [Propoxyphene  N-Acetaminophen] Hives and Nausea And Vomiting  . Estrogens   . Levofloxacin Other (See Comments)    abdominal pain  . Penicillins Other (See Comments)    Seizure   . Sulfa Antibiotics Hives and Nausea And Vomiting  . Latex Rash    Blisters and redness with bandaids-also sensitivity to latex gloves     Assessment/Plan:

## 2016-08-14 ENCOUNTER — Other Ambulatory Visit: Payer: Self-pay | Admitting: Internal Medicine

## 2016-08-14 ENCOUNTER — Other Ambulatory Visit: Payer: Self-pay | Admitting: *Deleted

## 2016-08-14 ENCOUNTER — Other Ambulatory Visit: Payer: Self-pay | Admitting: Cardiology

## 2016-08-14 DIAGNOSIS — E785 Hyperlipidemia, unspecified: Secondary | ICD-10-CM

## 2016-08-14 DIAGNOSIS — Z8249 Family history of ischemic heart disease and other diseases of the circulatory system: Secondary | ICD-10-CM

## 2016-08-14 LAB — HEPATIC FUNCTION PANEL
ALK PHOS: 90 IU/L (ref 39–117)
ALT: 34 IU/L — ABNORMAL HIGH (ref 0–32)
AST: 24 IU/L (ref 0–40)
Albumin: 4.5 g/dL (ref 3.5–5.5)
Bilirubin Total: 0.3 mg/dL (ref 0.0–1.2)
Bilirubin, Direct: 0.1 mg/dL (ref 0.00–0.40)
Total Protein: 7 g/dL (ref 6.0–8.5)

## 2016-08-14 LAB — LIPID PANEL
CHOL/HDL RATIO: 3.5 ratio (ref 0.0–4.4)
CHOLESTEROL TOTAL: 175 mg/dL (ref 100–199)
HDL: 50 mg/dL (ref >39)
LDL CALC: 64 mg/dL (ref 0–99)
TRIGLYCERIDES: 306 mg/dL — AB (ref 0–149)
VLDL CHOLESTEROL CAL: 61 mg/dL — AB (ref 5–40)

## 2016-08-17 ENCOUNTER — Other Ambulatory Visit: Payer: Self-pay | Admitting: Internal Medicine

## 2016-08-19 ENCOUNTER — Other Ambulatory Visit: Payer: Self-pay | Admitting: Internal Medicine

## 2016-08-21 ENCOUNTER — Ambulatory Visit (INDEPENDENT_AMBULATORY_CARE_PROVIDER_SITE_OTHER): Payer: Self-pay | Admitting: Pharmacist

## 2016-08-21 VITALS — BP 138/72 | HR 87

## 2016-08-21 DIAGNOSIS — I10 Essential (primary) hypertension: Secondary | ICD-10-CM

## 2016-08-21 MED ORDER — LOSARTAN POTASSIUM 100 MG PO TABS: 100.0000 mg | ORAL_TABLET | Freq: Every day | ORAL | 3 refills | Status: DC

## 2016-08-21 NOTE — Patient Instructions (Addendum)
Finish taking your losartan 50mg  this week.  Starting next Sunday, increase your dose to 100mg  daily. Take 2 of your 50mg  until you run out, then pick up your refill for the 100mg  strength and take 1 tablet daily.  Increase your fish oil to 2 capsules daily (1,000mg  capsules)  - this will help to lower your triglycerides.  Follow up in clinic in 3 weeks on Tuesday, April 17th at 8:30am for blood pressure check and lab work.  Call Megan in blood pressure clinic if you need to reschedule your appointment [phone]

## 2016-08-21 NOTE — Progress Notes (Signed)
Patient ID: Amy Lang                 DOB: [birth year]                      MRN: [id number]     HPI: Amy Lang is a 51 y.o. female patient of Dr Ellyn Hack referred by Cecilie Kicks, NP to HTN clinic. PMH is significant for HTN, obesity, and family history of premature CAD. She was seen in clinic 3 weeks ago and had elevated BP to 162/84 at that time due to running out of her BP medications.  Pt reports tolerating losartan well. She denies dizziness, blurred vision, or headache. Of note, pt does not have insurance and needs to pay her copays out of pocket. She has been a bit stressed, her father in law recently moved in with her. She does take venlafaxine which is potentially contributing to high BP. She is not using her prn ibuprofen on a regular basis.  Current HTN meds: losartan 50mg  daily  BP goal: <130/29mmHg  Family History: The patient's family history includes COPD in her mother; Heart disease in her maternal grandfather, maternal grandmother, mother, paternal grandfather, and paternal grandmother; Lung cancer in her father.  Mother with MI at 20 yrs. Died at 29.  Social History: The patient  reports that she has never smoked. She has never used smokeless tobacco. She reports that she does not drink alcohol or use drugs.   Diet: Avoids fried food and junk food. Drinks diet snapple and green tea as well as water. Used to drink sweet tea but many family members have DM. Does like salty foods but has tried to limit this.  Exercise: Walks at work at the [employer]. Has a recumbent bike and is hoping to use this more.  Wt Readings from Last 3 Encounters:  07/31/16 182 lb (82.6 kg)  10/29/15 176 lb 6.4 oz (80 kg)  01/20/15 183 lb 6.4 oz (83.2 kg)   BP Readings from Last 3 Encounters:  07/31/16 (!) 162/84  10/29/15 134/84  01/20/15 126/90   Pulse Readings from Last 3 Encounters:  07/31/16 84  10/29/15 72  01/20/15 79    Renal function: CrCl cannot be calculated (Patient's  most recent lab result is older than the maximum 21 days allowed.).  Past Medical History:  Diagnosis Date  . Allergy   . Anxiety   . Asthma   . Depression   . Dyslipidemia   . Hypertension   . IBS (irritable bowel syndrome)   . Migraines   . Obesity   . Shortness of breath   . Vaginal cancer [facility])     Current Outpatient Prescriptions on File Prior to Visit  Medication Sig Dispense Refill  . aspirin EC 81 MG tablet Take 81 mg by mouth every evening.     . cetirizine (ZYRTEC) 10 MG tablet Take 10 mg by mouth daily.     . Cholecalciferol (VITAMIN D3) 2000 UNITS capsule Take 1 capsule (2,000 Units total) by mouth daily. 100 capsule 3  . cyanocobalamin (,VITAMIN B-12,) 1000 MCG/ML injection Use 1 ml IM q day for 7 days, then 1 ml IM weekly for 1 month, then 1 ml IM every 2 weeks (lifetime) 10 mL 11  . eletriptan (RELPAX) 40 MG tablet May repeat in 2 hours if headache persists or recurs. 10 tablet 5  . fluticasone (FLONASE) 50 MCG/ACT nasal spray Place 2 sprays into the nose daily  as needed.    [name] ibuprofen (ADVIL,MOTRIN) 600 MG tablet Take 1 tablet (600 mg total) by mouth every 6 (six) hours as needed (mild pain). 30 tablet 2  . losartan (COZAAR) 50 MG tablet Take 1 tablet (50 mg total) by mouth daily. 90 tablet 3  . losartan (COZAAR) 50 MG tablet TAKE ONE TABLET BY MOUTH ONE TIME DAILY <PLEASE MAKE APPOINTMENT FOR REFILLS> 15 tablet 0  . Multiple Vitamin (MULTIVITAMIN WITH MINERALS) TABS Take 1 tablet by mouth daily.    . Multiple Vitamins-Minerals (PRESERVISION AREDS 2) CAPS Take 1 capsule by mouth at bedtime.    . ondansetron (ZOFRAN) 4 MG tablet Take 1 tablet (4 mg total) by mouth every 4 (four) hours as needed for nausea or vomiting. And diarrhea 90 tablet 3  . pantoprazole (PROTONIX) 20 MG tablet Take 1 tablet (20 mg total) by mouth daily before breakfast. 90 tablet 3  . Probiotic Product (ALIGN) 4 MG CAPS Take 1 capsule by mouth daily. 30 capsule 1  . SYRINGE-NEEDLE, DISP, 3 ML (BD  ECLIPSE SYRINGE) 25G X 1" 3 ML MISC As directed 50 each 3  . topiramate (TOPAMAX) 200 MG tablet TAKE 1 TABLET BY MOUTH DAILY 30 tablet 0  . venlafaxine XR (EFFEXOR-XR) 150 MG 24 hr capsule TAKE 1 CAPSULE BY MOUTH TWICE A DAY 60 capsule 0  . vitamin C (ASCORBIC ACID) 500 MG tablet Take 500 mg by mouth daily.      No current facility-administered medications on file prior to visit.     Allergies  Allergen Reactions  . Bee Pollen Anaphylaxis  . Butorphanol Tartrate Other (See Comments)    REACTION: hallucinations ( stadol)  . Codeine Sulfate Hives and Nausea And Vomiting  . Crestor [Rosuvastatin] Other (See Comments)    Body aches  . Darvocet [Propoxyphene N-Acetaminophen] Hives and Nausea And Vomiting  . Estrogens   . Levofloxacin Other (See Comments)    abdominal pain  . Penicillins Other (See Comments)    Seizure   . Sulfa Antibiotics Hives and Nausea And Vomiting  . Latex Rash    Blisters and redness with bandaids-also sensitivity to latex gloves     Assessment/Plan:  1. Hypertension - BP improved since restarting losartan but still above goal <130/23mmHg. Will increase losartan to 100mg  daily next week once pt can refill her losartan/ F/u in clinic 2 weeks after dose increase.   Kavontae Pritchard E. Brittnie Lewey, PharmD, CPP, [place] [address] [place], [place] [address] Phone: [phone]; Fax: [phone] 08/21/2016 2:16 PM

## 2016-09-11 ENCOUNTER — Encounter: Payer: Self-pay | Admitting: Internal Medicine

## 2016-09-11 ENCOUNTER — Other Ambulatory Visit (INDEPENDENT_AMBULATORY_CARE_PROVIDER_SITE_OTHER): Payer: Self-pay

## 2016-09-11 ENCOUNTER — Ambulatory Visit (INDEPENDENT_AMBULATORY_CARE_PROVIDER_SITE_OTHER): Payer: Self-pay | Admitting: Internal Medicine

## 2016-09-11 VITALS — BP 130/88 | HR 85 | Temp 98.3°F | Ht 61.0 in | Wt 182.0 lb

## 2016-09-11 DIAGNOSIS — E785 Hyperlipidemia, unspecified: Secondary | ICD-10-CM

## 2016-09-11 DIAGNOSIS — F411 Generalized anxiety disorder: Secondary | ICD-10-CM

## 2016-09-11 DIAGNOSIS — Z0000 Encounter for general adult medical examination without abnormal findings: Secondary | ICD-10-CM

## 2016-09-11 DIAGNOSIS — I10 Essential (primary) hypertension: Secondary | ICD-10-CM

## 2016-09-11 DIAGNOSIS — E538 Deficiency of other specified B group vitamins: Secondary | ICD-10-CM

## 2016-09-11 LAB — CBC WITH DIFFERENTIAL/PLATELET
BASOS ABS: 0 10*3/uL (ref 0.0–0.1)
Basophils Relative: 0.5 % (ref 0.0–3.0)
EOS ABS: 0 10*3/uL (ref 0.0–0.7)
Eosinophils Relative: 0.1 % (ref 0.0–5.0)
HCT: 42.1 % (ref 36.0–46.0)
Hemoglobin: 14.2 g/dL (ref 12.0–15.0)
LYMPHS ABS: 2.3 10*3/uL (ref 0.7–4.0)
Lymphocytes Relative: 32.6 % (ref 12.0–46.0)
MCHC: 33.7 g/dL (ref 30.0–36.0)
MCV: 89.2 fl (ref 78.0–100.0)
MONOS PCT: 7.8 % (ref 3.0–12.0)
Monocytes Absolute: 0.6 10*3/uL (ref 0.1–1.0)
NEUTROS ABS: 4.1 10*3/uL (ref 1.4–7.7)
NEUTROS PCT: 59 % (ref 43.0–77.0)
PLATELETS: 231 10*3/uL (ref 150.0–400.0)
RBC: 4.73 Mil/uL (ref 3.87–5.11)
RDW: 12.9 % (ref 11.5–15.5)
WBC: 7 10*3/uL (ref 4.0–10.5)

## 2016-09-11 LAB — URINALYSIS
BILIRUBIN URINE: NEGATIVE
Hgb urine dipstick: NEGATIVE
KETONES UR: NEGATIVE
LEUKOCYTES UA: NEGATIVE
Nitrite: NEGATIVE
Specific Gravity, Urine: >=1.03 — AB (ref 1.000–1.030)
Total Protein, Urine: NEGATIVE
URINE GLUCOSE: NEGATIVE
UROBILINOGEN UA: 0.2 (ref 0.0–1.0)
pH: 6 (ref 5.0–8.0)

## 2016-09-11 LAB — TSH: TSH: 1.9 u[IU]/mL (ref 0.35–4.50)

## 2016-09-11 MED ORDER — "SYRINGE/NEEDLE (DISP) 25G X 1"" 3 ML MISC": 3 refills | Status: DC

## 2016-09-11 MED ORDER — FLUTICASONE PROPIONATE 50 MCG/ACT NA SUSP: 2.0000 | Freq: Every day | NASAL | 3 refills | Status: DC | PRN

## 2016-09-11 MED ORDER — VENLAFAXINE HCL ER 150 MG PO CP24: 150.0000 mg | ORAL_CAPSULE | Freq: Two times a day (BID) | ORAL | 1 refills | Status: DC

## 2016-09-11 MED ORDER — ELETRIPTAN HYDROBROMIDE 40 MG PO TABS: ORAL_TABLET | ORAL | 3 refills | Status: DC

## 2016-09-11 MED ORDER — TOPIRAMATE 200 MG PO TABS: 200.0000 mg | ORAL_TABLET | Freq: Every day | ORAL | 1 refills | Status: DC

## 2016-09-11 MED ORDER — PANTOPRAZOLE SODIUM 20 MG PO TBEC: 20.0000 mg | DELAYED_RELEASE_TABLET | Freq: Every day | ORAL | 3 refills | Status: DC

## 2016-09-11 MED ORDER — CYANOCOBALAMIN 1000 MCG/ML IJ SOLN: INTRAMUSCULAR | 11 refills | Status: DC

## 2016-09-11 NOTE — Progress Notes (Signed)
Subjective:  Patient ID: Amy Lang, female    DOB: 03/03/1966  Age: 51 y.o. MRN: [id number]  CC: No chief complaint on file.   HPI [name] presents for well exam. F/u HAs, HTN, dyslipidemia  Outpatient Medications Prior to Visit  Medication Sig Dispense Refill  . aspirin EC 81 MG tablet Take 81 mg by mouth every evening.     . cetirizine (ZYRTEC) 10 MG tablet Take 10 mg by mouth daily.     . Cholecalciferol (VITAMIN D3) 2000 UNITS capsule Take 1 capsule (2,000 Units total) by mouth daily. 100 capsule 3  . cyanocobalamin (,VITAMIN B-12,) 1000 MCG/ML injection Use 1 ml IM q day for 7 days, then 1 ml IM weekly for 1 month, then 1 ml IM every 2 weeks (lifetime) 10 mL 11  . eletriptan (RELPAX) 40 MG tablet May repeat in 2 hours if headache persists or recurs. 10 tablet 5  . fluticasone (FLONASE) 50 MCG/ACT nasal spray Place 2 sprays into the nose daily as needed.    [name] ibuprofen (ADVIL,MOTRIN) 600 MG tablet Take 1 tablet (600 mg total) by mouth every 6 (six) hours as needed (mild pain). 30 tablet 2  . losartan (COZAAR) 100 MG tablet Take 1 tablet (100 mg total) by mouth daily. 90 tablet 3  . Multiple Vitamin (MULTIVITAMIN WITH MINERALS) TABS Take 1 tablet by mouth daily.    . Multiple Vitamins-Minerals (PRESERVISION AREDS 2) CAPS Take 1 capsule by mouth at bedtime.    . Omega-3 Fatty Acids (FISH OIL) 1000 MG CAPS Take 1,000 mg by mouth daily.    . ondansetron (ZOFRAN) 4 MG tablet Take 1 tablet (4 mg total) by mouth every 4 (four) hours as needed for nausea or vomiting. And diarrhea 90 tablet 3  . pantoprazole (PROTONIX) 20 MG tablet Take 1 tablet (20 mg total) by mouth daily before breakfast. 90 tablet 3  . Probiotic Product (ALIGN) 4 MG CAPS Take 1 capsule by mouth daily. 30 capsule 1  . SYRINGE-NEEDLE, DISP, 3 ML (BD ECLIPSE SYRINGE) 25G X 1" 3 ML MISC As directed 50 each 3  . topiramate (TOPAMAX) 200 MG tablet TAKE 1 TABLET BY MOUTH DAILY 30 tablet 0  . venlafaxine XR  (EFFEXOR-XR) 150 MG 24 hr capsule TAKE 1 CAPSULE BY MOUTH TWICE A DAY 60 capsule 0  . vitamin C (ASCORBIC ACID) 500 MG tablet Take 500 mg by mouth daily.      No facility-administered medications prior to visit.     ROS Review of Systems  Constitutional: Negative for activity change, appetite change, chills, fatigue and unexpected weight change.  HENT: Negative for congestion, mouth sores and sinus pressure.   Eyes: Negative for visual disturbance.  Respiratory: Negative for cough and chest tightness.   Gastrointestinal: Negative for abdominal pain and nausea.  Genitourinary: Negative for difficulty urinating, frequency and vaginal pain.  Musculoskeletal: Negative for back pain and gait problem.  Skin: Negative for pallor and rash.  Neurological: Positive for headaches. Negative for dizziness, tremors, weakness and numbness.  Psychiatric/Behavioral: Negative for confusion, sleep disturbance and suicidal ideas.    Objective:  BP 130/88 (BP Location: Left Arm, Patient Position: Sitting, Cuff Size: Normal)   Pulse 85   Temp 98.3 F (36.8 C) (Oral)   Ht 5\' 1"  (1.549 m)   Wt 182 lb (82.6 kg)   SpO2 99%   BMI 34.39 kg/m   BP Readings from Last 3 Encounters:  09/11/16 130/88  08/21/16 138/72  07/31/16 [name])  162/84    Wt Readings from Last 3 Encounters:  09/11/16 182 lb (82.6 kg)  07/31/16 182 lb (82.6 kg)  10/29/15 176 lb 6.4 oz (80 kg)    Physical Exam  Constitutional: She appears well-developed. No distress.  HENT:  Head: Normocephalic.  Right Ear: External ear normal.  Left Ear: External ear normal.  Nose: Nose normal.  Mouth/Throat: Oropharynx is clear and moist.  Eyes: Conjunctivae are normal. Pupils are equal, round, and reactive to light. Right eye exhibits no discharge. Left eye exhibits no discharge.  Neck: Normal range of motion. Neck supple. No JVD present. No tracheal deviation present. No thyromegaly present.  Cardiovascular: Normal rate, regular rhythm and  normal heart sounds.   Pulmonary/Chest: No stridor. No respiratory distress. She has no wheezes.  Abdominal: Soft. Bowel sounds are normal. She exhibits no distension and no mass. There is no tenderness. There is no rebound and no guarding.  Musculoskeletal: She exhibits no edema or tenderness.  Lymphadenopathy:    She has no cervical adenopathy.  Neurological: She displays normal reflexes. No cranial nerve deficit. She exhibits normal muscle tone. Coordination normal.  Skin: No rash noted. No erythema.  Psychiatric: She has a normal mood and affect. Her behavior is normal. Judgment and thought content normal.  obese  Lab Results  Component Value Date   WBC 7.2 01/13/2015   HGB 14.0 01/13/2015   HCT 41.2 01/13/2015   PLT 218.0 01/13/2015   GLUCOSE 98 05/14/2014   CHOL 175 08/14/2016   TRIG 306 (H) 08/14/2016   HDL 50 08/14/2016   LDLDIRECT 112.9 04/15/2012   LDLCALC 64 08/14/2016   ALT 34 (H) 08/14/2016   AST 24 08/14/2016   NA 140 05/14/2014   K 4.1 05/14/2014   CL 109 05/14/2014   CREATININE 0.7 05/14/2014   BUN 15 05/14/2014   CO2 22 05/14/2014   TSH 1.02 04/15/2012    Mm Screening Breast Tomo Bilateral  Result Date: 07/12/2015 CLINICAL DATA:  Screening. EXAM: DIGITAL SCREENING BILATERAL MAMMOGRAM WITH 3D TOMO WITH CAD COMPARISON:  Previous exam(s). ACR Breast Density Category c: The breast tissue is heterogeneously dense, which may obscure small masses. FINDINGS: There are no findings suspicious for malignancy. Images were processed with CAD. IMPRESSION: No mammographic evidence of malignancy. A result letter of this screening mammogram will be mailed directly to the patient. RECOMMENDATION: Screening mammogram in one year. (Code:SM-B-01Y) BI-RADS CATEGORY  1: Negative. Electronically Signed   By: Lajean Manes M.D.   On: 07/13/2015 12:23    Assessment & Plan:   There are no diagnoses linked to this encounter. I am having Ms. Odden maintain her cetirizine, aspirin EC,  vitamin C, multivitamin with minerals, ibuprofen, ALIGN, fluticasone, PRESERVISION AREDS 2, eletriptan, SYRINGE-NEEDLE (DISP) 3 ML, Vitamin D3, cyanocobalamin, pantoprazole, ondansetron, venlafaxine XR, topiramate, losartan, and Fish Oil.  No orders of the defined types were placed in this encounter.    Follow-up: No Follow-up on file.  Walker Kehr, MD

## 2016-09-11 NOTE — Assessment & Plan Note (Signed)
On Vit B12 

## 2016-09-11 NOTE — Progress Notes (Signed)
Pre visit review using our clinic review tool, if applicable. No additional management support is needed unless otherwise documented below in the visit note. 

## 2016-09-11 NOTE — Patient Instructions (Signed)
Shingrix Pneumovax Prevnar

## 2016-09-11 NOTE — Assessment & Plan Note (Signed)
Losartan 

## 2016-09-11 NOTE — Assessment & Plan Note (Signed)
On meds

## 2016-09-11 NOTE — Assessment & Plan Note (Signed)
On Effexor 

## 2016-09-11 NOTE — Assessment & Plan Note (Signed)
We discussed age appropriate health related issues, including available/recomended screening tests and vaccinations. We discussed a need for adhering to healthy diet and exercise. Labs ordered. All questions were answered. 

## 2016-09-12 ENCOUNTER — Ambulatory Visit (INDEPENDENT_AMBULATORY_CARE_PROVIDER_SITE_OTHER): Payer: Self-pay | Admitting: Pharmacist

## 2016-09-12 ENCOUNTER — Other Ambulatory Visit: Payer: Self-pay

## 2016-09-12 VITALS — BP 128/82 | HR 73

## 2016-09-12 DIAGNOSIS — I10 Essential (primary) hypertension: Secondary | ICD-10-CM

## 2016-09-12 LAB — SPECIMEN STATUS

## 2016-09-12 NOTE — Patient Instructions (Signed)
Your blood pressure was excellent today, at goal <130/84mmHg.  Continue taking your losartan 100mg  daily and stay active.  Call clinic with any problems [phone]

## 2016-09-12 NOTE — Progress Notes (Signed)
Patient ID: Amy Lang                 DOB: [birth year]                      MRN: [id number]     HPI: Amy Lang is a 51 y.o. female patient of Dr Ellyn Hack referred by Cecilie Kicks, NP to HTN clinic who presents today for follow up. PMH is significant for HTN, obesity, and family history of premature CAD. She was seen in clinic 3 weeks ago and her losartan dose was increased to 100mg  daily at that time.  Pt reports feeling well overall. Pt reports tolerating losartan well. She denies dizziness, blurred vision, or headache. Of note, pt does not have insurance and needs to pay her copays out of pocket. She has been a bit stressed, her father in law recently moved in with her. She does take venlafaxine which is potentially contributing to high BP. She is not using her prn ibuprofen on a regular basis. She has started to walk on a more regular basis.  Current HTN meds: losartan 100mg  daily  BP goal: <130/44mmHg  Family History: The patient's family history includes COPD in her mother; Heart disease in her maternal grandfather, maternal grandmother, mother, paternal grandfather, and paternal grandmother; Lung cancer in her father.Mother with MI at 32 yrs. Died at 14.  Social History: The patient reports that she has never smoked. She has never used smokeless tobacco. She reports that she does not drink alcohol or use drugs.  Diet: Avoids fried food and junk food. Drinks diet snapple and green tea as well as water. Used to drink sweet tea but many family members have DM. Does like salty foods but has tried to limit this.  Exercise: Walks at work at the [employer]. Has a recumbent bike and is hoping to use this more.  Wt Readings from Last 3 Encounters:  09/11/16 182 lb (82.6 kg)  07/31/16 182 lb (82.6 kg)  10/29/15 176 lb 6.4 oz (80 kg)   BP Readings from Last 3 Encounters:  09/11/16 130/88  08/21/16 138/72  07/31/16 (!) 162/84   Pulse Readings from Last 3 Encounters:  09/11/16  85  08/21/16 87  07/31/16 84    Renal function: CrCl cannot be calculated (Patient's most recent lab result is older than the maximum 21 days allowed.).  Past Medical History:  Diagnosis Date  . Allergy   . Anxiety   . Asthma   . Depression   . Dyslipidemia   . Hypertension   . IBS (irritable bowel syndrome)   . Migraines   . Obesity   . Shortness of breath   . 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 aspirin EC 81 MG tablet Take 81 mg by mouth every evening.     . cetirizine (ZYRTEC) 10 MG tablet Take 10 mg by mouth daily.     . Cholecalciferol (VITAMIN D3) 2000 UNITS capsule Take 1 capsule (2,000 Units total) by mouth daily. 100 capsule 3  . cyanocobalamin (,VITAMIN B-12,) 1000 MCG/ML injection Use 1 ml IM q day for 7 days, then 1 ml IM weekly for 1 month, then 1 ml IM every 2 weeks (lifetime) 10 mL 11  . eletriptan (RELPAX) 40 MG tablet May repeat in 2 hours if headache persists or recurs. 30 tablet 3  . fluticasone (FLONASE) 50  MCG/ACT nasal spray Place 2 sprays into both nostrils daily as needed. 54 g 3  . ibuprofen (ADVIL,MOTRIN) 600 MG tablet Take 1 tablet (600 mg total) by mouth every 6 (six) hours as needed (mild pain). 30 tablet 2  . losartan (COZAAR) 100 MG tablet Take 1 tablet (100 mg total) by mouth daily. 90 tablet 3  . Multiple Vitamin (MULTIVITAMIN WITH MINERALS) TABS Take 1 tablet by mouth daily.    . Multiple Vitamins-Minerals (PRESERVISION AREDS 2) CAPS Take 1 capsule by mouth at bedtime.    . Omega-3 Fatty Acids (FISH OIL) 1000 MG CAPS Take 1,000 mg by mouth daily.    . ondansetron (ZOFRAN) 4 MG tablet Take 1 tablet (4 mg total) by mouth every 4 (four) hours as needed for nausea or vomiting. And diarrhea 90 tablet 3  . pantoprazole (PROTONIX) 20 MG tablet Take 1 tablet (20 mg total) by mouth daily before breakfast. 90 tablet 3  . Probiotic Product (ALIGN) 4 MG CAPS Take 1 capsule by  mouth daily. 30 capsule 1  . SYRINGE-NEEDLE, DISP, 3 ML (BD ECLIPSE SYRINGE) 25G X 1" 3 ML MISC As directed 50 each 3  . topiramate (TOPAMAX) 200 MG tablet Take 1 tablet (200 mg total) by mouth daily. 90 tablet 1  . venlafaxine XR (EFFEXOR-XR) 150 MG 24 hr capsule Take 1 capsule (150 mg total) by mouth 2 (two) times daily. 180 capsule 1  . vitamin C (ASCORBIC ACID) 500 MG tablet Take 500 mg by mouth daily.      No current facility-administered medications on file prior to visit.     Allergies  Allergen Reactions  . Bee Pollen Anaphylaxis  . Butorphanol Tartrate Other (See Comments)    REACTION: hallucinations ( stadol)  . Codeine Sulfate Hives and Nausea And Vomiting  . Crestor [Rosuvastatin] Other (See Comments)    Body aches  . Darvocet [Propoxyphene N-Acetaminophen] Hives and Nausea And Vomiting  . Estrogens   . Levofloxacin Other (See Comments)    abdominal pain  . Penicillins Other (See Comments)    Seizure   . Sulfa Antibiotics Hives and Nausea And Vomiting  . Latex Rash    Blisters and redness with bandaids-also sensitivity to latex gloves     Assessment/Plan:  1. Hypertension - BP is at goal <130/5mmHg since increasing losartan to 100mg  daily. Will plan to continue losartan and will check BMET today. Advised pt to continue to stay active and to call clinic with any BP related concerns.   Megan E. Supple, PharmD, CPP, [place] [address] [place], [place] [address] Phone: [phone]; Fax: [phone] 09/12/2016 7:14 AM

## 2016-09-13 ENCOUNTER — Telehealth: Payer: Self-pay | Admitting: Cardiology

## 2016-09-13 LAB — BASIC METABOLIC PANEL
BUN/Creatinine Ratio: 25 — ABNORMAL HIGH (ref 9–23)
BUN: 19 mg/dL (ref 6–24)
CO2: 20 mmol/L (ref 18–29)
Calcium: 8.8 mg/dL (ref 8.7–10.2)
Chloride: 106 mmol/L (ref 96–106)
Creatinine, Ser: 0.75 mg/dL (ref 0.57–1.00)
GFR, EST AFRICAN AMERICAN: 106 mL/min/{1.73_m2} (ref >59)
GFR, EST NON AFRICAN AMERICAN: 92 mL/min/{1.73_m2} (ref >59)
Glucose: 159 mg/dL — ABNORMAL HIGH (ref 65–99)
POTASSIUM: 4.6 mmol/L (ref 3.5–5.2)
SODIUM: 142 mmol/L (ref 134–144)

## 2016-09-13 NOTE — Telephone Encounter (Signed)
-----   Message from Isaiah Serge, NP sent at 09/13/2016  6:16 AM EDT ----- Labs stable follow up with dr. Ellyn Hack in 6 months.

## 2016-09-13 NOTE — Telephone Encounter (Signed)
Follow Up: ° ° ° °Returning your call, concerning her lab results. °

## 2017-03-21 ENCOUNTER — Other Ambulatory Visit: Payer: Self-pay | Admitting: Obstetrics and Gynecology

## 2017-03-21 DIAGNOSIS — Z139 Encounter for screening, unspecified: Secondary | ICD-10-CM

## 2017-03-22 ENCOUNTER — Other Ambulatory Visit: Payer: Self-pay | Admitting: Internal Medicine

## 2017-03-23 NOTE — Telephone Encounter (Signed)
Routing to dr plotnikov, please advise, thanks 

## 2017-03-26 NOTE — Telephone Encounter (Signed)
Pt called regarding this  Please send this to [facility] on [address], she has not had any in 2 days.

## 2017-03-27 NOTE — Telephone Encounter (Signed)
Pt called in about this again

## 2017-04-23 ENCOUNTER — Ambulatory Visit: Payer: Self-pay

## 2017-04-26 ENCOUNTER — Telehealth: Payer: Self-pay | Admitting: Internal Medicine

## 2017-04-26 NOTE — Telephone Encounter (Signed)
Patient with bleeding external hemorrhoids and pain.  She is asked to try sitz baths BID and recticare or preparation H with lidocaine.  She will take imodium for diarrhea.  She will call back next week if she is still having symptoms for an OV

## 2017-06-04 ENCOUNTER — Ambulatory Visit: Payer: Self-pay

## 2017-06-18 ENCOUNTER — Ambulatory Visit: Payer: Self-pay

## 2017-06-20 ENCOUNTER — Other Ambulatory Visit: Payer: Self-pay | Admitting: Internal Medicine

## 2017-06-20 MED ORDER — TOPIRAMATE 200 MG PO TABS: 200.0000 mg | ORAL_TABLET | Freq: Every day | ORAL | 0 refills | Status: DC

## 2017-06-20 NOTE — Telephone Encounter (Signed)
Patient informed. 

## 2017-06-20 NOTE — Telephone Encounter (Signed)
Route to CMA °

## 2017-06-20 NOTE — Telephone Encounter (Signed)
Copied from [place]. Topic: Quick Communication - Rx Refill/Question >> Jun 20, 2017  1:59 PM Patrice Paradise wrote: Medication: topiramate (TOPAMAX) 200 MG tablet    Has the patient contacted their pharmacy? Yes.     (Agent: If no, request that the patient contact the pharmacy for the refill.)   Preferred Pharmacy (with phone number or street name):   [facility] [id number] [place] - [place] [address] [place] [address] Phone: [phone] Fax: [phone]    Agent: Please be advised that RX refills may take up to 3 business days. We ask that you follow-up with your pharmacy.

## 2017-06-20 NOTE — Telephone Encounter (Signed)
Last OV 09/11/16.  Next OV is 08/20/17 with Dr. Alain Marion.   Requesting a refill on the Topamax.   Didn't know whether to approve this or not.

## 2017-06-25 ENCOUNTER — Other Ambulatory Visit: Payer: Self-pay | Admitting: Internal Medicine

## 2017-06-25 ENCOUNTER — Telehealth: Payer: Self-pay | Admitting: Internal Medicine

## 2017-06-25 MED ORDER — TOPIRAMATE 200 MG PO TABS: 200.0000 mg | ORAL_TABLET | Freq: Every day | ORAL | 0 refills | Status: DC

## 2017-06-25 NOTE — Telephone Encounter (Signed)
topiramate (TOPAMAX) 200 MG tablet

## 2017-06-25 NOTE — Telephone Encounter (Signed)
PER CHART RX WAS SENT TO [facility].. RESENT TO [facility]!

## 2017-06-25 NOTE — Telephone Encounter (Signed)
Copied from [place] [id number]. Topic: Quick Communication - See Telephone Encounter >> Jun 25, 2017  1:30 PM Hewitt Shorts wrote: CRM for notification. See Telephone encounter for: pt is stating that [facility] does not have the tapamax rx that was sent on 06/20/17 at 345 and they would like it resent   Best number [phone]  06/25/17.

## 2017-06-25 NOTE — Telephone Encounter (Signed)
pharmacy calling checking on status of faxing over tapamax

## 2017-07-16 ENCOUNTER — Ambulatory Visit: Payer: Managed Care, Other (non HMO)

## 2017-07-30 ENCOUNTER — Other Ambulatory Visit: Payer: Self-pay | Admitting: Obstetrics and Gynecology

## 2017-07-30 DIAGNOSIS — N6002 Solitary cyst of left breast: Secondary | ICD-10-CM

## 2017-07-30 DIAGNOSIS — R208 Other disturbances of skin sensation: Secondary | ICD-10-CM

## 2017-08-06 ENCOUNTER — Other Ambulatory Visit: Payer: Self-pay | Admitting: Obstetrics and Gynecology

## 2017-08-06 ENCOUNTER — Ambulatory Visit
Admission: RE | Admit: 2017-08-06 | Discharge: 2017-08-06 | Disposition: A | Discharge status: Home / Self Care | Payer: Managed Care, Other (non HMO) | Source: Ambulatory Visit | Attending: Obstetrics and Gynecology | Admitting: Obstetrics and Gynecology

## 2017-08-06 DIAGNOSIS — N6002 Solitary cyst of left breast: Secondary | ICD-10-CM

## 2017-08-06 DIAGNOSIS — N6310 Unspecified lump in the right breast, unspecified quadrant: Secondary | ICD-10-CM

## 2017-08-06 DIAGNOSIS — R208 Other disturbances of skin sensation: Secondary | ICD-10-CM

## 2017-08-08 ENCOUNTER — Ambulatory Visit
Admission: RE | Admit: 2017-08-08 | Discharge: 2017-08-08 | Disposition: A | Discharge status: Home / Self Care | Payer: Managed Care, Other (non HMO) | Source: Ambulatory Visit | Attending: Obstetrics and Gynecology | Admitting: Obstetrics and Gynecology

## 2017-08-08 ENCOUNTER — Other Ambulatory Visit: Payer: Self-pay | Admitting: Obstetrics and Gynecology

## 2017-08-08 DIAGNOSIS — N6310 Unspecified lump in the right breast, unspecified quadrant: Secondary | ICD-10-CM

## 2017-08-17 ENCOUNTER — Other Ambulatory Visit: Payer: Self-pay | Admitting: Obstetrics and Gynecology

## 2017-08-17 ENCOUNTER — Ambulatory Visit: Payer: Self-pay | Admitting: Surgery

## 2017-08-17 ENCOUNTER — Other Ambulatory Visit: Payer: Self-pay | Admitting: Surgery

## 2017-08-17 DIAGNOSIS — D241 Benign neoplasm of right breast: Secondary | ICD-10-CM

## 2017-08-17 NOTE — H&P (Signed)
Amy Lang Documented: 08/17/2017 11:01 AM Location: [facility] Patient #: [id number] DOB: 11-12-1965 Married / Language: Amy Lang / Race: White Female  History of Present Illness Amy Lang A. Amy Haskins MD; 08/17/2017 11:38 AM) Patient words: Patient sent at the request of Dr.Hu for burning sensation over her right breast. She had 2 areas bilateral nipple that were burning and causing discomfort. This prompted mammography of ultrasound. Core biopsy was done of the lesion behind the right nipple consistent with papilloma. There is no atypia. The patient complains of right breast burning and discomfort. Is gone on for a number of months. There is no radiation nothing makes it better or worse. There is no nipple drainage associated with it. Left breast is normal. She states she has a history of fibrocystic change and has multiple first-degree relatives with breast cancer.                        CLINICAL DATA: 52 year old female presenting for evaluation of a burning sensation in the retroareolar right breast as well as 2 palpable sites of concern in the upper-outer left breast. The burning sensation has been present for 1 year. The palpable sites have been present for several years, however the patient feels that they are increasing in size.  EXAM: DIGITAL DIAGNOSTIC BILATERAL MAMMOGRAM WITH CAD AND TOMO  BILATERAL BREAST ULTRASOUND  COMPARISON: Previous exam(s).  ACR Breast Density Category c: The breast tissue is heterogeneously dense, which may obscure small masses.  FINDINGS: In the retroareolar right breast there is a 6-7 mm oval circumscribed mass. At the site of 1 of the 2 palpable markers in the upper-outer left breast there is a superficial oval circumscribed mass measuring approximately 5 mm which could be in or on the skin. No other suspicious masses are identified deep markers. No other suspicious calcifications, masses or areas of  distortion are seen in the bilateral breasts.  Mammographic images were processed with CAD.  Ultrasound of the left breast at the palpable sites at 1:30, 6 cm from the nipple, there is normal fibroglandular tissue. No masses or suspicious areas of shadowing are identified. The patient does have several moles on her skin in this location.  Ultrasound of the retroareolar right breast at approximately 11 o'clock, 1 cm from the nipple demonstrates an irregular anechoic and hypoechoic mass together measuring 0.8 x 0.3 x 1.0 cm. This appears to be associated with a tortuous prominent duct. This could represent debris within a duct versus a small mass such as a papilloma within a duct.  IMPRESSION: 1. There is a possible intraductal mass in the right breast at 11 o'clock, though this could represent debris within a duct or fibrocystic change. This also may explain the patient's feeling of burning and heat in the retroareolar right breast.  2. There are no mammographic abnormalities at the palpable sites of concern in the upper-outer left breast.  RECOMMENDATION: Ultrasound-guided biopsy is recommended for the right breast mass at 11 o'clock. This has been scheduled for 08/08/2017 at 3:45 p.m.  I have discussed the findings and recommendations with the patient. Results were also provided in writing at the conclusion of the visit. If applicable, a reminder letter will be sent to the patient regarding the next appointment.  BI-RADS CATEGORY 4: Suspicious.   Electronically Signed By: Ammie Ferrier M.D. On: 08/06/2017 14:17            Diagnosis Breast, right, needle core biopsy, upper outer - DUCTAL PAPILLOMA. -  FIBROCYSTIC CHANGES. - NO EVIDENCE OF MALIGNANCY. Microscopic Comment Called to [facility] on 08/09/17. (JDP:gt, 08/09/17) Amy Laws MD.  The patient is a 52 year old female.   Past Surgical History (Amy Lang, [place];  08/17/2017 11:02 AM) Breast Biopsy Right. multiple Gallbladder Surgery - Laparoscopic Hysterectomy (not due to cancer) - Complete Knee Surgery Left.  Diagnostic Studies History (Amy Lang, [place]; 08/17/2017 11:02 AM) Colonoscopy 1-5 years ago Mammogram within last year Pap Smear 1-5 years ago  Allergies (Amy Lang, [place]; 08/17/2017 11:03 AM) Penicillins CODEINE Sulfa Antibiotics Latex Allergies Reconciled  Medication History (Amy Lang, RMA; 08/17/2017 11:06 AM) Topiramate (200MG  Tablet, Oral) Active. Aspirin (81MG  Tablet, Oral) Active. Omega-3 (1000MG  Capsule, Oral) Active. Vitamin B12 (Oral) Specific strength unknown - Active. Pantoprazole Sodium (20MG  Tablet DR, Oral) Active. Effexor XR (150MG  Capsule ER 24HR, Oral) Active. Losartan Potassium (100MG  Tablet, Oral) Active. Multi-Vitamin (Oral) Active. ZyrTEC Allergy (10MG  Tablet, Oral) Active. Medications Reconciled  Social History (Amy Lang, [place]; 08/17/2017 11:02 AM) Caffeine use Coffee, Tea. No alcohol use No drug use Tobacco use Never smoker.  Family History (Amy Lang, [place]; 08/17/2017 11:02 AM) Cancer Father. Colon Polyps Father. Depression Brother. Diabetes Mellitus Mother. Heart Disease Brother, Mother. Heart disease in female family member before age 48 Heart disease in female family member before age 76 Hypertension Mother. Kidney Disease Mother. Migraine Headache Brother, Father. Respiratory Condition Mother. Thyroid problems Mother.  Pregnancy / Birth History (Amy Lang, [place]; 08/17/2017 11:02 AM) Age at menarche 24 years. Para 0  Other Problems (Amy Lang, [place]; 08/17/2017 11:02 AM) Anxiety Disorder Asthma Cancer Gastroesophageal Reflux Disease General anesthesia - complications Heart murmur Hemorrhoids Migraine Headache Oophorectomy     Review of Systems (Amy Lang RMA; 08/17/2017 11:02  AM) General Present- Night Sweats. Not Present- Appetite Loss, Chills, Fatigue, Fever, Weight Gain and Weight Loss. Skin Present- Dryness. Not Present- Change in Wart/Mole, Hives, Jaundice, New Lesions, Non-Healing Wounds, Rash and Ulcer. HEENT Present- Seasonal Allergies. Not Present- Earache, Hearing Loss, Hoarseness, Nose Bleed, Oral Ulcers, Ringing in the Ears, Sinus Pain, Sore Throat, Visual Disturbances, Wears glasses/contact lenses and Yellow Eyes. Respiratory Not Present- Bloody sputum, Chronic Cough, Difficulty Breathing, Snoring and Wheezing. Breast Present- Breast Mass. Not Present- Breast Pain, Nipple Discharge and Skin Changes. Cardiovascular Present- Leg Cramps. Not Present- Chest Pain, Difficulty Breathing Lying Down, Palpitations, Rapid Heart Rate, Shortness of Breath and Swelling of Extremities. Gastrointestinal Present- Chronic diarrhea, Hemorrhoids and Rectal Pain. Not Present- Abdominal Pain, Bloating, Bloody Stool, Change in Bowel Habits, Constipation, Difficulty Swallowing, Excessive gas, Gets full quickly at meals, Indigestion, Nausea and Vomiting. Female Genitourinary Present- Nocturia. Not Present- Frequency, Painful Urination, Pelvic Pain and Urgency. Musculoskeletal Not Present- Back Pain, Joint Pain, Joint Stiffness, Muscle Pain, Muscle Weakness and Swelling of Extremities. Neurological Present- Headaches and Tingling. Not Present- Decreased Memory, Fainting, Numbness, Seizures, Tremor, Trouble walking and Weakness. Psychiatric Present- Anxiety. Not Present- Bipolar, Change in Sleep Pattern, Depression, Fearful and Frequent crying. Endocrine Present- Heat Intolerance and Hot flashes. Not Present- Cold Intolerance, Excessive Hunger, Hair Changes and New Diabetes. Hematology Not Present- Blood Thinners, Easy Bruising, Excessive bleeding, Gland problems, HIV and Persistent Infections.  Vitals (Amy Lang RMA; 08/17/2017 11:02 AM) 08/17/2017 11:02 AM Weight: 182.2 lb  Height: 60in Body Surface Area: 1.79 m Body Mass Index: 35.58 kg/m  Temp.: 98.10F  Pulse: 86 (Regular)  BP: 138/92 (Sitting, Left Arm, Standard)      Physical Exam (Rodert Hinch A.  Braxden Lovering MD; 08/17/2017 11:38 AM)  General Mental Status-Alert. General Appearance-Consistent with stated age. Hydration-Well hydrated. Voice-Normal.  Chest and Lung Exam Chest and lung exam reveals -quiet, even and easy respiratory effort with no use of accessory muscles and on auscultation, normal breath sounds, no adventitious sounds and normal vocal resonance. Inspection Chest Wall - Normal. Back - normal.  Breast Breast - Left-Symmetric, Non Tender, No Biopsy scars, no Dimpling, No Inflammation, No Lumpectomy scars, No Mastectomy scars, No Peau d' Orange. Breast - Right-Symmetric, Non Tender, No Biopsy scars, no Dimpling, No Inflammation, No Lumpectomy scars, No Mastectomy scars, No Peau d' Orange. Breast Lump-No Palpable Breast Mass.  Cardiovascular Cardiovascular examination reveals -normal heart sounds, regular rate and rhythm with no murmurs and normal pedal pulses bilaterally.  Neurologic Neurologic evaluation reveals -alert and oriented x 3 with no impairment of recent or remote memory. Mental Status-Normal.  Lymphatic Head & Neck  General Head & Neck Lymphatics: Bilateral - Description - Normal. Axillary  General Axillary Region: Bilateral - Description - Normal. Tenderness - Non Tender.    Assessment & Plan (Johnjoseph Rolfe A. Yazleemar Strassner MD; 08/17/2017 11:39 AM)  PAPILLOMA OF RIGHT BREAST (D24.1) Impression: Discussed potential upgrade risk of papilloma on core biopsy. Given her family history recommend right breast seed localized lumpectomy. Observation also discussed the pros and cons of that discussed. The rationale for excision were discussed which can be a upgrade to a malignant lesion. Risk of lumpectomy include bleeding, infection, seroma, more surgery, use of  seed/wire, wound care, cosmetic deformity and the need for other treatments, death , blood clots, death. Pt agrees to proceed.  Current Plans You are being scheduled for surgery- Our schedulers will call you.  You should hear from our office's scheduling department within 5 working days about the location, date, and time of surgery. We try to make accommodations for patient's preferences in scheduling surgery, but sometimes the OR schedule or the surgeon's schedule prevents [name] from making those accommodations.  If you have not heard from our office [phone]) in 5 working days, call the office and ask for your surgeon's nurse.  If you have other questions about your diagnosis, plan, or surgery, call the office and ask for your surgeon's nurse.  Pt Education - [facility] Breast Biopsy HCI: discussed with patient and provided information. Pt Education - [facility] Breast Pains Education The anatomy and the physiology was discussed. The pathophysiology and natural history of the disease was discussed. Options were discussed and recommendations were made. Technique, risks, benefits, & alternatives were discussed. Risks such as stroke, heart attack, bleeding, cosmesis , indection, death, and other risks discussed. Questions answered. The patient agrees to proceed.

## 2017-08-17 NOTE — H&P (View-Only) (Signed)
Amy Lang Documented: 08/17/2017 11:01 AM Location: [facility] Patient #: [id number] DOB: [birth year] Married / Language: Amy Lang / Race: White Female  History of Present Illness Amy Lang A. Amy Yochim MD; 08/17/2017 11:38 AM) Patient words: Patient sent at the request of Dr.Hu for burning sensation over her right breast. She had 2 areas bilateral nipple that were burning and causing discomfort. This prompted mammography of ultrasound. Core biopsy was done of the lesion behind the right nipple consistent with papilloma. There is no atypia. The patient complains of right breast burning and discomfort. Is gone on for a number of months. There is no radiation nothing makes it better or worse. There is no nipple drainage associated with it. Left breast is normal. She states she has a history of fibrocystic change and has multiple first-degree relatives with breast cancer.                        CLINICAL DATA: 52 year old female presenting for evaluation of a burning sensation in the retroareolar right breast as well as 2 palpable sites of concern in the upper-outer left breast. The burning sensation has been present for 1 year. The palpable sites have been present for several years, however the patient feels that they are increasing in size.  EXAM: DIGITAL DIAGNOSTIC BILATERAL MAMMOGRAM WITH CAD AND TOMO  BILATERAL BREAST ULTRASOUND  COMPARISON: Previous exam(s).  ACR Breast Density Category c: The breast tissue is heterogeneously dense, which may obscure small masses.  FINDINGS: In the retroareolar right breast there is a 6-7 mm oval circumscribed mass. At the site of 1 of the 2 palpable markers in the upper-outer left breast there is a superficial oval circumscribed mass measuring approximately 5 mm which could be in or on the skin. No other suspicious masses are identified deep markers. No other suspicious calcifications, masses or areas of  distortion are seen in the bilateral breasts.  Mammographic images were processed with CAD.  Ultrasound of the left breast at the palpable sites at 1:30, 6 cm from the nipple, there is normal fibroglandular tissue. No masses or suspicious areas of shadowing are identified. The patient does have several moles on her skin in this location.  Ultrasound of the retroareolar right breast at approximately 11 o'clock, 1 cm from the nipple demonstrates an irregular anechoic and hypoechoic mass together measuring 0.8 x 0.3 x 1.0 cm. This appears to be associated with a tortuous prominent duct. This could represent debris within a duct versus a small mass such as a papilloma within a duct.  IMPRESSION: 1. There is a possible intraductal mass in the right breast at 11 o'clock, though this could represent debris within a duct or fibrocystic change. This also may explain the patient's feeling of burning and heat in the retroareolar right breast.  2. There are no mammographic abnormalities at the palpable sites of concern in the upper-outer left breast.  RECOMMENDATION: Ultrasound-guided biopsy is recommended for the right breast mass at 11 o'clock. This has been scheduled for 08/08/2017 at 3:45 p.m.  I have discussed the findings and recommendations with the patient. Results were also provided in writing at the conclusion of the visit. If applicable, a reminder letter will be sent to the patient regarding the next appointment.  BI-RADS CATEGORY 4: Suspicious.   Electronically Signed By: Ammie Ferrier M.D. On: 08/06/2017 14:17            Diagnosis Breast, right, needle core biopsy, upper outer - DUCTAL PAPILLOMA. -  FIBROCYSTIC CHANGES. - NO EVIDENCE OF MALIGNANCY. Microscopic Comment Called to [facility] on 08/09/17. (JDP:gt, 08/09/17) Amy Laws MD.  The patient is a 52 year old female.   Past Surgical History (Amy Lang, [place];  08/17/2017 11:02 AM) Breast Biopsy Right. multiple Gallbladder Surgery - Laparoscopic Hysterectomy (not due to cancer) - Complete Knee Surgery Left.  Diagnostic Studies History (Amy Lang, [place]; 08/17/2017 11:02 AM) Colonoscopy 1-5 years ago Mammogram within last year Pap Smear 1-5 years ago  Allergies (Amy Lang, [place]; 08/17/2017 11:03 AM) Penicillins CODEINE Sulfa Antibiotics Latex Allergies Reconciled  Medication History (Amy Lang, RMA; 08/17/2017 11:06 AM) Topiramate (200MG  Tablet, Oral) Active. Aspirin (81MG  Tablet, Oral) Active. Omega-3 (1000MG  Capsule, Oral) Active. Vitamin B12 (Oral) Specific strength unknown - Active. Pantoprazole Sodium (20MG  Tablet DR, Oral) Active. Effexor XR (150MG  Capsule ER 24HR, Oral) Active. Losartan Potassium (100MG  Tablet, Oral) Active. Multi-Vitamin (Oral) Active. ZyrTEC Allergy (10MG  Tablet, Oral) Active. Medications Reconciled  Social History (Amy Lang, [place]; 08/17/2017 11:02 AM) Caffeine use Coffee, Tea. No alcohol use No drug use Tobacco use Never smoker.  Family History (Amy Lang, [place]; 08/17/2017 11:02 AM) Cancer Father. Colon Polyps Father. Depression Brother. Diabetes Mellitus Mother. Heart Disease Brother, Mother. Heart disease in female family member before age 30 Heart disease in female family member before age 39 Hypertension Mother. Kidney Disease Mother. Migraine Headache Brother, Father. Respiratory Condition Mother. Thyroid problems Mother.  Pregnancy / Birth History (Amy Lang, [place]; 08/17/2017 11:02 AM) Age at menarche 44 years. Para 0  Other Problems (Amy Lang, [place]; 08/17/2017 11:02 AM) Anxiety Disorder Asthma Cancer Gastroesophageal Reflux Disease General anesthesia - complications Heart murmur Hemorrhoids Migraine Headache Oophorectomy     Review of Systems (Amy Lang RMA; 08/17/2017 11:02  AM) General Present- Night Sweats. Not Present- Appetite Loss, Chills, Fatigue, Fever, Weight Gain and Weight Loss. Skin Present- Dryness. Not Present- Change in Wart/Mole, Hives, Jaundice, New Lesions, Non-Healing Wounds, Rash and Ulcer. HEENT Present- Seasonal Allergies. Not Present- Earache, Hearing Loss, Hoarseness, Nose Bleed, Oral Ulcers, Ringing in the Ears, Sinus Pain, Sore Throat, Visual Disturbances, Wears glasses/contact lenses and Yellow Eyes. Respiratory Not Present- Bloody sputum, Chronic Cough, Difficulty Breathing, Snoring and Wheezing. Breast Present- Breast Mass. Not Present- Breast Pain, Nipple Discharge and Skin Changes. Cardiovascular Present- Leg Cramps. Not Present- Chest Pain, Difficulty Breathing Lying Down, Palpitations, Rapid Heart Rate, Shortness of Breath and Swelling of Extremities. Gastrointestinal Present- Chronic diarrhea, Hemorrhoids and Rectal Pain. Not Present- Abdominal Pain, Bloating, Bloody Stool, Change in Bowel Habits, Constipation, Difficulty Swallowing, Excessive gas, Gets full quickly at meals, Indigestion, Nausea and Vomiting. Female Genitourinary Present- Nocturia. Not Present- Frequency, Painful Urination, Pelvic Pain and Urgency. Musculoskeletal Not Present- Back Pain, Joint Pain, Joint Stiffness, Muscle Pain, Muscle Weakness and Swelling of Extremities. Neurological Present- Headaches and Tingling. Not Present- Decreased Memory, Fainting, Numbness, Seizures, Tremor, Trouble walking and Weakness. Psychiatric Present- Anxiety. Not Present- Bipolar, Change in Sleep Pattern, Depression, Fearful and Frequent crying. Endocrine Present- Heat Intolerance and Hot flashes. Not Present- Cold Intolerance, Excessive Hunger, Hair Changes and New Diabetes. Hematology Not Present- Blood Thinners, Easy Bruising, Excessive bleeding, Gland problems, HIV and Persistent Infections.  Vitals (Amy Lang RMA; 08/17/2017 11:02 AM) 08/17/2017 11:02 AM Weight: 182.2 lb  Height: 60in Body Surface Area: 1.79 m Body Mass Index: 35.58 kg/m  Temp.: 98.31F  Pulse: 86 (Regular)  BP: 138/92 (Sitting, Left Arm, Standard)      Physical Exam (Karliah Kowalchuk A.  Selam Pietsch MD; 08/17/2017 11:38 AM)  General Mental Status-Alert. General Appearance-Consistent with stated age. Hydration-Well hydrated. Voice-Normal.  Chest and Lung Exam Chest and lung exam reveals -quiet, even and easy respiratory effort with no use of accessory muscles and on auscultation, normal breath sounds, no adventitious sounds and normal vocal resonance. Inspection Chest Wall - Normal. Back - normal.  Breast Breast - Left-Symmetric, Non Tender, No Biopsy scars, no Dimpling, No Inflammation, No Lumpectomy scars, No Mastectomy scars, No Peau d' Orange. Breast - Right-Symmetric, Non Tender, No Biopsy scars, no Dimpling, No Inflammation, No Lumpectomy scars, No Mastectomy scars, No Peau d' Orange. Breast Lump-No Palpable Breast Mass.  Cardiovascular Cardiovascular examination reveals -normal heart sounds, regular rate and rhythm with no murmurs and normal pedal pulses bilaterally.  Neurologic Neurologic evaluation reveals -alert and oriented x 3 with no impairment of recent or remote memory. Mental Status-Normal.  Lymphatic Head & Neck  General Head & Neck Lymphatics: Bilateral - Description - Normal. Axillary  General Axillary Region: Bilateral - Description - Normal. Tenderness - Non Tender.    Assessment & Plan (Vaishnavi Dalby A. Lacoya Wilbanks MD; 08/17/2017 11:39 AM)  PAPILLOMA OF RIGHT BREAST (D24.1) Impression: Discussed potential upgrade risk of papilloma on core biopsy. Given her family history recommend right breast seed localized lumpectomy. Observation also discussed the pros and cons of that discussed. The rationale for excision were discussed which can be a upgrade to a malignant lesion. Risk of lumpectomy include bleeding, infection, seroma, more surgery, use of  seed/wire, wound care, cosmetic deformity and the need for other treatments, death , blood clots, death. Pt agrees to proceed.  Current Plans You are being scheduled for surgery- Our schedulers will call you.  You should hear from our office's scheduling department within 5 working days about the location, date, and time of surgery. We try to make accommodations for patient's preferences in scheduling surgery, but sometimes the OR schedule or the surgeon's schedule prevents [name] from making those accommodations.  If you have not heard from our office [phone]) in 5 working days, call the office and ask for your surgeon's nurse.  If you have other questions about your diagnosis, plan, or surgery, call the office and ask for your surgeon's nurse.  Pt Education - [facility] Breast Biopsy HCI: discussed with patient and provided information. Pt Education - [facility] Breast Pains Education The anatomy and the physiology was discussed. The pathophysiology and natural history of the disease was discussed. Options were discussed and recommendations were made. Technique, risks, benefits, & alternatives were discussed. Risks such as stroke, heart attack, bleeding, cosmesis , indection, death, and other risks discussed. Questions answered. The patient agrees to proceed.

## 2017-08-20 ENCOUNTER — Encounter: Payer: Self-pay | Admitting: Internal Medicine

## 2017-08-20 ENCOUNTER — Ambulatory Visit (INDEPENDENT_AMBULATORY_CARE_PROVIDER_SITE_OTHER): Payer: Managed Care, Other (non HMO) | Admitting: Internal Medicine

## 2017-08-20 VITALS — BP 122/74 | HR 86 | Temp 98.6°F | Ht 61.0 in | Wt 182.0 lb

## 2017-08-20 DIAGNOSIS — Z23 Encounter for immunization: Secondary | ICD-10-CM

## 2017-08-20 DIAGNOSIS — I10 Essential (primary) hypertension: Secondary | ICD-10-CM

## 2017-08-20 DIAGNOSIS — F411 Generalized anxiety disorder: Secondary | ICD-10-CM | POA: Diagnosis not present

## 2017-08-20 DIAGNOSIS — G43109 Migraine with aura, not intractable, without status migrainosus: Secondary | ICD-10-CM

## 2017-08-20 DIAGNOSIS — J4520 Mild intermittent asthma, uncomplicated: Secondary | ICD-10-CM

## 2017-08-20 DIAGNOSIS — Z0000 Encounter for general adult medical examination without abnormal findings: Secondary | ICD-10-CM | POA: Diagnosis not present

## 2017-08-20 DIAGNOSIS — F4321 Adjustment disorder with depressed mood: Secondary | ICD-10-CM | POA: Diagnosis not present

## 2017-08-20 DIAGNOSIS — E538 Deficiency of other specified B group vitamins: Secondary | ICD-10-CM

## 2017-08-20 MED ORDER — ELETRIPTAN HYDROBROMIDE 40 MG PO TABS: ORAL_TABLET | ORAL | 2 refills | Status: DC

## 2017-08-20 MED ORDER — ALPRAZOLAM 0.5 MG PO TABS: 0.5000 mg | ORAL_TABLET | Freq: Three times a day (TID) | ORAL | 2 refills | Status: DC | PRN

## 2017-08-20 MED ORDER — VENLAFAXINE HCL ER 37.5 MG PO CP24
ORAL_CAPSULE | ORAL | 0 refills | Status: DC
Start: 2017-08-20 — End: 2017-08-23

## 2017-08-20 MED ORDER — FLUOXETINE HCL 20 MG PO TABS: 20.0000 mg | ORAL_TABLET | Freq: Every day | ORAL | 5 refills | Status: DC

## 2017-08-20 MED ORDER — PANTOPRAZOLE SODIUM 20 MG PO TBEC: 20.0000 mg | DELAYED_RELEASE_TABLET | Freq: Every day | ORAL | 3 refills | Status: DC

## 2017-08-20 MED ORDER — LOSARTAN POTASSIUM 100 MG PO TABS: 100.0000 mg | ORAL_TABLET | Freq: Every day | ORAL | 3 refills | Status: DC

## 2017-08-20 MED ORDER — CYANOCOBALAMIN 1000 MCG/ML IJ SOLN: INTRAMUSCULAR | 5 refills | Status: DC

## 2017-08-20 MED ORDER — ALPRAZOLAM 0.5 MG PO TBDP: 0.5000 mg | ORAL_TABLET | Freq: Two times a day (BID) | ORAL | 1 refills | Status: DC | PRN

## 2017-08-20 NOTE — Assessment & Plan Note (Signed)
Losartan 

## 2017-08-20 NOTE — Assessment & Plan Note (Signed)
Xanax prn  Potential benefits of a long term benzodiazepines  use as well as potential risks  and complications were explained to the patient and were aknowledged. 

## 2017-08-20 NOTE — Progress Notes (Signed)
Subjective:  Patient ID: Amy Lang, female    DOB: [birth year]  Age: 52 y.o. MRN: [id number]  CC: No chief complaint on file.   HPI JUANNA PUDLO presents for a well exam C/o anxiety.Wants to get off Effexor  Outpatient Medications Prior to Visit  Medication Sig Dispense Refill  . aspirin EC 81 MG tablet Take 81 mg by mouth every evening.     . cetirizine (ZYRTEC) 10 MG tablet Take 10 mg by mouth daily.     . Cholecalciferol (VITAMIN D3) 2000 UNITS capsule Take 1 capsule (2,000 Units total) by mouth daily. 100 capsule 3  . cyanocobalamin (,VITAMIN B-12,) 1000 MCG/ML injection Use 1 ml IM q day for 7 days, then 1 ml IM weekly for 1 month, then 1 ml IM every 2 weeks (lifetime) 10 mL 11  . eletriptan (RELPAX) 40 MG tablet May repeat in 2 hours if headache persists or recurs. 30 tablet 3  . fluticasone (FLONASE) 50 MCG/ACT nasal spray Place 2 sprays into both nostrils daily as needed. 54 g 3  . ibuprofen (ADVIL,MOTRIN) 600 MG tablet Take 1 tablet (600 mg total) by mouth every 6 (six) hours as needed (mild pain). 30 tablet 2  . losartan (COZAAR) 100 MG tablet Take 1 tablet (100 mg total) by mouth daily. 90 tablet 3  . Multiple Vitamin (MULTIVITAMIN WITH MINERALS) TABS Take 1 tablet by mouth daily.    . Multiple Vitamins-Minerals (PRESERVISION AREDS 2) CAPS Take 1 capsule by mouth at bedtime.    . Omega-3 Fatty Acids (FISH OIL) 1000 MG CAPS Take 1,000 mg by mouth daily.    . ondansetron (ZOFRAN) 4 MG tablet Take 1 tablet (4 mg total) by mouth every 4 (four) hours as needed for nausea or vomiting. And diarrhea 90 tablet 3  . pantoprazole (PROTONIX) 20 MG tablet Take 1 tablet (20 mg total) by mouth daily before breakfast. 90 tablet 3  . Probiotic Product (ALIGN) 4 MG CAPS Take 1 capsule by mouth daily. 30 capsule 1  . SYRINGE-NEEDLE, DISP, 3 ML (BD ECLIPSE SYRINGE) 25G X 1" 3 ML MISC As directed 50 each 3  . topiramate (TOPAMAX) 200 MG tablet Take 1 tablet (200 mg total) by mouth daily.  Must keep OV in March to get refills 90 tablet 0  . venlafaxine XR (EFFEXOR-XR) 150 MG 24 hr capsule Take 1 capsule (150 mg total) by mouth 2 (two) times daily. 180 capsule 1  . vitamin C (ASCORBIC ACID) 500 MG tablet Take 500 mg by mouth daily.      No facility-administered medications prior to visit.     ROS Review of Systems  Constitutional: Positive for fatigue. Negative for activity change, appetite change, chills and unexpected weight change.  HENT: Negative for congestion, mouth sores and sinus pressure.   Eyes: Negative for visual disturbance.  Respiratory: Negative for cough and chest tightness.   Gastrointestinal: Negative for abdominal pain and nausea.  Genitourinary: Negative for difficulty urinating, frequency and vaginal pain.  Musculoskeletal: Negative for back pain and gait problem.  Skin: Negative for pallor and rash.  Neurological: Negative for dizziness, tremors, weakness, numbness and headaches.  Psychiatric/Behavioral: Positive for dysphoric mood and sleep disturbance. Negative for confusion. The patient is nervous/anxious.     Objective:  There were no vitals taken for this visit.  BP Readings from Last 3 Encounters:  09/12/16 128/82  09/11/16 130/88  08/21/16 138/72    Wt Readings from Last 3 Encounters:  09/11/16 182 lb (  82.6 kg)  07/31/16 182 lb (82.6 kg)  10/29/15 176 lb 6.4 oz (80 kg)    Physical Exam  Constitutional: She appears well-developed. No distress.  HENT:  Head: Normocephalic.  Right Ear: External ear normal.  Left Ear: External ear normal.  Nose: Nose normal.  Mouth/Throat: Oropharynx is clear and moist.  Eyes: Pupils are equal, round, and reactive to light. Conjunctivae are normal. Right eye exhibits no discharge. Left eye exhibits no discharge.  Neck: Normal range of motion. Neck supple. No JVD present. No tracheal deviation present. No thyromegaly present.  Cardiovascular: Normal rate, regular rhythm and normal heart sounds.    Pulmonary/Chest: No stridor. No respiratory distress. She has no wheezes.  Abdominal: Soft. Bowel sounds are normal. She exhibits no distension and no mass. There is no tenderness. There is no rebound and no guarding.  Musculoskeletal: She exhibits tenderness. She exhibits no edema.  Lymphadenopathy:    She has no cervical adenopathy.  Neurological: She displays normal reflexes. No cranial nerve deficit. She exhibits normal muscle tone. Coordination normal.  Skin: No rash noted. No erythema.  Psychiatric: She has a normal mood and affect. Her behavior is normal. Judgment and thought content normal.    Lab Results  Component Value Date   WBC WILL FOLLOW 09/12/2016   HGB WILL FOLLOW 09/12/2016   HCT WILL FOLLOW 09/12/2016   PLT WILL FOLLOW 09/12/2016   GLUCOSE 159 (H) 09/12/2016   CHOL 175 08/14/2016   TRIG 306 (H) 08/14/2016   HDL 50 08/14/2016   LDLDIRECT 112.9 04/15/2012   LDLCALC 64 08/14/2016   ALT 34 (H) 08/14/2016   AST 24 08/14/2016   NA 142 09/12/2016   K 4.6 09/12/2016   CL 106 09/12/2016   CREATININE 0.75 09/12/2016   BUN 19 09/12/2016   CO2 20 09/12/2016   TSH 1.90 09/11/2016    Mm Clip Placement Right  Result Date: 08/08/2017 CLINICAL DATA:  Evaluate clip placement following ultrasound-guided RIGHT breast biopsy. EXAM: DIAGNOSTIC RIGHT MAMMOGRAM POST ULTRASOUND BIOPSY COMPARISON:  Previous exam(s). FINDINGS: Mammographic images were obtained following ultrasound guided biopsy of the 1 cm hypoechoic area/intraductal mass/debris at the 11 o'clock position of the RIGHT breast 1 cm from the nipple. The heart shaped clip is in satisfactory position. IMPRESSION: Satisfactory heart shaped clip position following ultrasound-guided RIGHT breast biopsy. Final Assessment: Post Procedure Mammograms for Marker Placement Electronically Signed   By: Margarette Canada M.D.   On: 08/08/2017 16:29   [name] Rt Breast Bx W Loc Dev 1st Lesion Img Bx Spec US Guide  Addendum Date: 08/09/2017    ADDENDUM REPORT: 08/09/2017 12:52 ADDENDUM: Pathology revealed DUCTAL PAPILLOMA, FIBROCYSTIC CHANGES of the Right breast, upper outer. This was found to be concordant by Dr. Hassan Rowan, with excision recommended. Pathology results were discussed with the patient by telephone. The patient reported doing well after the biopsy with tenderness at the site. Post biopsy instructions and care were reviewed and questions were answered. The patient was encouraged to call [facility] for any additional concerns. Surgical consultation has been arranged with Dr. Erroll Luna at [facility] on August 17, 2017. Pathology results reported by Terie Purser, RN on 08/09/2017. Electronically Signed   By: Margarette Canada M.D.   On: 08/09/2017 12:52   Result Date: 08/09/2017 CLINICAL DATA:  52 year old female for tissue sampling of anechoic/hypoechoic mass within the UPPER-OUTER RIGHT breast EXAM: ULTRASOUND GUIDED RIGHT BREAST CORE NEEDLE BIOPSY COMPARISON:  Previous exam(s). FINDINGS: I met with  the patient and we discussed the procedure of ultrasound-guided biopsy, including benefits and alternatives. We discussed the high likelihood of a successful procedure. We discussed the risks of the procedure, including infection, bleeding, tissue injury, clip migration, and inadequate sampling. Informed written consent was given. The usual time-out protocol was performed immediately prior to the procedure. Using sterile technique and 1% Lidocaine as local anesthetic, under direct ultrasound visualization, a 12 gauge spring-loaded device was used to perform biopsy of the 1 cm anechoic/hypoechoic mass at the 11 o'clock position of the RIGHT breast 1 cm from the nipple using a LATERAL approach. At the conclusion of the procedure a heart shaped tissue marker clip was deployed into the biopsy cavity. Follow up 2 view mammogram was performed and dictated separately. IMPRESSION: Ultrasound guided biopsy of 1 cm  anechoic/hypoechoic mass at the 11 o'clock position of the RIGHT breast. No apparent complications. Electronically Signed: By: Margarette Canada M.D. On: 08/08/2017 16:34    Assessment & Plan:   There are no diagnoses linked to this encounter. I am having Liya B. Barsky maintain her cetirizine, aspirin EC, vitamin C, multivitamin with minerals, ibuprofen, ALIGN, PRESERVISION AREDS 2, Vitamin D3, ondansetron, losartan, Fish Oil, cyanocobalamin, eletriptan, fluticasone, pantoprazole, SYRINGE-NEEDLE (DISP) 3 ML, venlafaxine XR, and topiramate.  No orders of the defined types were placed in this encounter.    Follow-up: No follow-ups on file.  Walker Kehr, MD

## 2017-08-20 NOTE — Assessment & Plan Note (Signed)
Changing to Prozac Tapering off Effexor

## 2017-08-20 NOTE — Assessment & Plan Note (Signed)
On b12 

## 2017-08-20 NOTE — Assessment & Plan Note (Signed)
Topamax, Prozac

## 2017-08-20 NOTE — Assessment & Plan Note (Signed)
MDI prn exercising Prevnar

## 2017-08-20 NOTE — Assessment & Plan Note (Signed)
We discussed age appropriate health related issues, including available/recomended screening tests and vaccinations. We discussed a need for adhering to healthy diet and exercise. Labs were ordered to be later reviewed . All questions were answered.   

## 2017-08-23 ENCOUNTER — Encounter (HOSPITAL_BASED_OUTPATIENT_CLINIC_OR_DEPARTMENT_OTHER): Payer: Self-pay | Admitting: *Deleted

## 2017-08-27 ENCOUNTER — Telehealth: Payer: Self-pay | Admitting: Internal Medicine

## 2017-08-27 ENCOUNTER — Encounter (HOSPITAL_BASED_OUTPATIENT_CLINIC_OR_DEPARTMENT_OTHER)
Admission: RE | Admit: 2017-08-27 | Discharge: 2017-08-27 | Disposition: A | Discharge status: Home / Self Care | Payer: Managed Care, Other (non HMO) | Source: Ambulatory Visit | Attending: Surgery | Admitting: Surgery

## 2017-08-27 ENCOUNTER — Ambulatory Visit
Admission: RE | Admit: 2017-08-27 | Discharge: 2017-08-27 | Disposition: A | Discharge status: Home / Self Care | Payer: Managed Care, Other (non HMO) | Source: Ambulatory Visit | Attending: Surgery | Admitting: Surgery

## 2017-08-27 DIAGNOSIS — Z91030 Bee allergy status: Secondary | ICD-10-CM | POA: Diagnosis not present

## 2017-08-27 DIAGNOSIS — Z803 Family history of malignant neoplasm of breast: Secondary | ICD-10-CM | POA: Diagnosis not present

## 2017-08-27 DIAGNOSIS — F419 Anxiety disorder, unspecified: Secondary | ICD-10-CM | POA: Diagnosis not present

## 2017-08-27 DIAGNOSIS — D241 Benign neoplasm of right breast: Secondary | ICD-10-CM

## 2017-08-27 DIAGNOSIS — Z885 Allergy status to narcotic agent status: Secondary | ICD-10-CM | POA: Diagnosis not present

## 2017-08-27 DIAGNOSIS — K219 Gastro-esophageal reflux disease without esophagitis: Secondary | ICD-10-CM | POA: Diagnosis not present

## 2017-08-27 DIAGNOSIS — J45909 Unspecified asthma, uncomplicated: Secondary | ICD-10-CM | POA: Diagnosis not present

## 2017-08-27 DIAGNOSIS — Z91040 Latex allergy status: Secondary | ICD-10-CM | POA: Diagnosis not present

## 2017-08-27 DIAGNOSIS — Z8249 Family history of ischemic heart disease and other diseases of the circulatory system: Secondary | ICD-10-CM | POA: Diagnosis not present

## 2017-08-27 DIAGNOSIS — Z79899 Other long term (current) drug therapy: Secondary | ICD-10-CM | POA: Diagnosis not present

## 2017-08-27 DIAGNOSIS — Z880 Allergy status to penicillin: Secondary | ICD-10-CM | POA: Diagnosis not present

## 2017-08-27 DIAGNOSIS — N6011 Diffuse cystic mastopathy of right breast: Secondary | ICD-10-CM | POA: Diagnosis not present

## 2017-08-27 DIAGNOSIS — Z882 Allergy status to sulfonamides status: Secondary | ICD-10-CM | POA: Diagnosis not present

## 2017-08-27 DIAGNOSIS — I10 Essential (primary) hypertension: Secondary | ICD-10-CM | POA: Diagnosis not present

## 2017-08-27 DIAGNOSIS — Z859 Personal history of malignant neoplasm, unspecified: Secondary | ICD-10-CM | POA: Diagnosis not present

## 2017-08-27 DIAGNOSIS — R011 Cardiac murmur, unspecified: Secondary | ICD-10-CM | POA: Diagnosis not present

## 2017-08-27 DIAGNOSIS — Z888 Allergy status to other drugs, medicaments and biological substances status: Secondary | ICD-10-CM | POA: Diagnosis not present

## 2017-08-27 DIAGNOSIS — Z7982 Long term (current) use of aspirin: Secondary | ICD-10-CM | POA: Diagnosis not present

## 2017-08-27 HISTORY — PX: BREAST EXCISIONAL BIOPSY: SUR124

## 2017-08-27 MED ORDER — TOPIRAMATE 200 MG PO TABS: 200.0000 mg | ORAL_TABLET | Freq: Every day | ORAL | 3 refills | Status: DC

## 2017-08-27 MED ORDER — VENLAFAXINE HCL ER 37.5 MG PO CP24: ORAL_CAPSULE | ORAL | 0 refills | Status: DC

## 2017-08-27 NOTE — Progress Notes (Signed)
ENsure pre surgery drink given with instructions to complete by [facility], pt verbalized understanding.

## 2017-08-27 NOTE — Telephone Encounter (Signed)
Please advise about medications

## 2017-08-27 NOTE — Telephone Encounter (Signed)
I'm not sure what happened to topamax Venlafaxin was emailed then cancelled I'll re-do -- sorry! Thx

## 2017-08-27 NOTE — Telephone Encounter (Signed)
Copied from [place] [id number]. Topic: Quick Communication - Rx Refill/Question >> Aug 27, 2017  4:19 PM Margot Ables wrote: Medication: topiramate (TOPAMAX) 200 MG tablet - pt states she is out since Friday. Previous RX was sent to [facility] and pt never picked it up because she was having medication changes. Pt states that she was to be weaning off of venlafaxine XR (EFFEXOR XR) 150mg  - she said no new RX was sent to the pharmacy for 75mg  or 37.5mg  for her to wean down. Please advise on this medication. Has the patient contacted their pharmacy? Yes - no RXs on file Preferred Pharmacy (with phone number or street name): [id number] - [place], [place] [phone] (Phone) [phone] (Fax)

## 2017-08-28 ENCOUNTER — Ambulatory Visit (HOSPITAL_BASED_OUTPATIENT_CLINIC_OR_DEPARTMENT_OTHER): Payer: Managed Care, Other (non HMO) | Admitting: Certified Registered"

## 2017-08-28 ENCOUNTER — Ambulatory Visit
Admission: RE | Admit: 2017-08-28 | Discharge: 2017-08-28 | Disposition: A | Discharge status: Home / Self Care | Payer: Managed Care, Other (non HMO) | Source: Ambulatory Visit | Attending: Surgery | Admitting: Surgery

## 2017-08-28 ENCOUNTER — Ambulatory Visit (HOSPITAL_BASED_OUTPATIENT_CLINIC_OR_DEPARTMENT_OTHER)
Admission: RE | Admit: 2017-08-28 | Discharge: 2017-08-28 | Disposition: A | Discharge status: Home / Self Care | Payer: Managed Care, Other (non HMO) | Source: Ambulatory Visit | Attending: Surgery | Admitting: Surgery

## 2017-08-28 ENCOUNTER — Encounter (HOSPITAL_BASED_OUTPATIENT_CLINIC_OR_DEPARTMENT_OTHER)
Admission: RE | Disposition: A | Discharge status: Home / Self Care | Payer: Self-pay | Source: Ambulatory Visit | Attending: Surgery

## 2017-08-28 ENCOUNTER — Encounter (HOSPITAL_BASED_OUTPATIENT_CLINIC_OR_DEPARTMENT_OTHER): Payer: Self-pay | Admitting: *Deleted

## 2017-08-28 ENCOUNTER — Other Ambulatory Visit: Payer: Self-pay

## 2017-08-28 DIAGNOSIS — Z882 Allergy status to sulfonamides status: Secondary | ICD-10-CM | POA: Insufficient documentation

## 2017-08-28 DIAGNOSIS — Z880 Allergy status to penicillin: Secondary | ICD-10-CM | POA: Insufficient documentation

## 2017-08-28 DIAGNOSIS — D241 Benign neoplasm of right breast: Secondary | ICD-10-CM | POA: Diagnosis not present

## 2017-08-28 DIAGNOSIS — F419 Anxiety disorder, unspecified: Secondary | ICD-10-CM | POA: Insufficient documentation

## 2017-08-28 DIAGNOSIS — I10 Essential (primary) hypertension: Secondary | ICD-10-CM | POA: Insufficient documentation

## 2017-08-28 DIAGNOSIS — Z888 Allergy status to other drugs, medicaments and biological substances status: Secondary | ICD-10-CM | POA: Insufficient documentation

## 2017-08-28 DIAGNOSIS — Z8249 Family history of ischemic heart disease and other diseases of the circulatory system: Secondary | ICD-10-CM | POA: Insufficient documentation

## 2017-08-28 DIAGNOSIS — Z79899 Other long term (current) drug therapy: Secondary | ICD-10-CM | POA: Insufficient documentation

## 2017-08-28 DIAGNOSIS — K219 Gastro-esophageal reflux disease without esophagitis: Secondary | ICD-10-CM | POA: Insufficient documentation

## 2017-08-28 DIAGNOSIS — Z803 Family history of malignant neoplasm of breast: Secondary | ICD-10-CM | POA: Insufficient documentation

## 2017-08-28 DIAGNOSIS — R011 Cardiac murmur, unspecified: Secondary | ICD-10-CM | POA: Insufficient documentation

## 2017-08-28 DIAGNOSIS — N6011 Diffuse cystic mastopathy of right breast: Secondary | ICD-10-CM | POA: Insufficient documentation

## 2017-08-28 DIAGNOSIS — Z885 Allergy status to narcotic agent status: Secondary | ICD-10-CM | POA: Insufficient documentation

## 2017-08-28 DIAGNOSIS — Z859 Personal history of malignant neoplasm, unspecified: Secondary | ICD-10-CM | POA: Insufficient documentation

## 2017-08-28 DIAGNOSIS — Z91040 Latex allergy status: Secondary | ICD-10-CM | POA: Insufficient documentation

## 2017-08-28 DIAGNOSIS — Z91030 Bee allergy status: Secondary | ICD-10-CM | POA: Insufficient documentation

## 2017-08-28 DIAGNOSIS — Z7982 Long term (current) use of aspirin: Secondary | ICD-10-CM | POA: Insufficient documentation

## 2017-08-28 DIAGNOSIS — J45909 Unspecified asthma, uncomplicated: Secondary | ICD-10-CM | POA: Insufficient documentation

## 2017-08-28 HISTORY — PX: BREAST LUMPECTOMY WITH RADIOACTIVE SEED LOCALIZATION: SHX6424

## 2017-08-28 SURGERY — BREAST LUMPECTOMY WITH RADIOACTIVE SEED LOCALIZATION
Anesthesia: General | Site: Breast | Laterality: Right

## 2017-08-28 MED ORDER — MEPERIDINE HCL 25 MG/ML IJ SOLN: 6.2500 mg | INTRAMUSCULAR | Status: DC | PRN

## 2017-08-28 MED ORDER — DEXAMETHASONE SODIUM PHOSPHATE 10 MG/ML IJ SOLN
INTRAMUSCULAR | Status: AC
Start: 2017-08-28 — End: ?
  Filled 2017-08-28: qty 1

## 2017-08-28 MED ORDER — EPHEDRINE SULFATE 50 MG/ML IJ SOLN
INTRAMUSCULAR | Status: DC | PRN
  Administered 2017-08-28: 10 mg via INTRAVENOUS
  Administered 2017-08-28: 15 mg via INTRAVENOUS

## 2017-08-28 MED ORDER — PROPOFOL 10 MG/ML IV BOLUS
INTRAVENOUS | Status: DC | PRN
  Administered 2017-08-28: 150 mg via INTRAVENOUS

## 2017-08-28 MED ORDER — SCOPOLAMINE 1 MG/3DAYS TD PT72
1.0000 | MEDICATED_PATCH | Freq: Once | TRANSDERMAL | Status: DC | PRN
  Administered 2017-08-28: 1.5 mg via TRANSDERMAL

## 2017-08-28 MED ORDER — MIDAZOLAM HCL 2 MG/2ML IJ SOLN
INTRAMUSCULAR | Status: AC
  Filled 2017-08-28: qty 2

## 2017-08-28 MED ORDER — CLINDAMYCIN PHOSPHATE 900 MG/50ML IV SOLN
INTRAVENOUS | Status: AC
  Filled 2017-08-28: qty 50

## 2017-08-28 MED ORDER — METOCLOPRAMIDE HCL 5 MG/ML IJ SOLN: 10.0000 mg | Freq: Once | INTRAMUSCULAR | Status: DC | PRN

## 2017-08-28 MED ORDER — PROPOFOL 10 MG/ML IV BOLUS
INTRAVENOUS | Status: AC
  Filled 2017-08-28: qty 20

## 2017-08-28 MED ORDER — CLINDAMYCIN PHOSPHATE 900 MG/50ML IV SOLN
900.0000 mg | INTRAVENOUS | Status: AC
  Administered 2017-08-28: 900 mg via INTRAVENOUS

## 2017-08-28 MED ORDER — CHLORHEXIDINE GLUCONATE CLOTH 2 % EX PADS: 6.0000 | MEDICATED_PAD | Freq: Once | CUTANEOUS | Status: DC

## 2017-08-28 MED ORDER — LIDOCAINE HCL (CARDIAC) 20 MG/ML IV SOLN
INTRAVENOUS | Status: DC | PRN
  Administered 2017-08-28: 100 mg via INTRAVENOUS

## 2017-08-28 MED ORDER — FENTANYL CITRATE (PF) 100 MCG/2ML IJ SOLN
50.0000 ug | INTRAMUSCULAR | Status: DC | PRN
  Administered 2017-08-28: 50 ug via INTRAVENOUS

## 2017-08-28 MED ORDER — MIDAZOLAM HCL 2 MG/2ML IJ SOLN
1.0000 mg | INTRAMUSCULAR | Status: DC | PRN
  Administered 2017-08-28: 2 mg via INTRAVENOUS

## 2017-08-28 MED ORDER — GABAPENTIN 300 MG PO CAPS
ORAL_CAPSULE | ORAL | Status: AC
  Filled 2017-08-28: qty 1

## 2017-08-28 MED ORDER — LACTATED RINGERS IV SOLN
INTRAVENOUS | Status: DC
  Administered 2017-08-28: 12:00:00 via INTRAVENOUS

## 2017-08-28 MED ORDER — LACTATED RINGERS IV SOLN: INTRAVENOUS | Status: DC

## 2017-08-28 MED ORDER — BUPIVACAINE-EPINEPHRINE (PF) 0.25% -1:200000 IJ SOLN
INTRAMUSCULAR | Status: DC | PRN
  Administered 2017-08-28: 18 mL

## 2017-08-28 MED ORDER — GABAPENTIN 300 MG PO CAPS
300.0000 mg | ORAL_CAPSULE | ORAL | Status: AC
  Administered 2017-08-28: 300 mg via ORAL

## 2017-08-28 MED ORDER — FENTANYL CITRATE (PF) 100 MCG/2ML IJ SOLN
INTRAMUSCULAR | Status: AC
  Filled 2017-08-28: qty 2

## 2017-08-28 MED ORDER — IBUPROFEN 800 MG PO TABS: 800.0000 mg | ORAL_TABLET | Freq: Three times a day (TID) | ORAL | 0 refills | Status: DC | PRN

## 2017-08-28 MED ORDER — FENTANYL CITRATE (PF) 100 MCG/2ML IJ SOLN: 25.0000 ug | INTRAMUSCULAR | Status: DC | PRN

## 2017-08-28 MED ORDER — ONDANSETRON HCL 4 MG/2ML IJ SOLN
INTRAMUSCULAR | Status: DC | PRN
  Administered 2017-08-28: 4 mg via INTRAVENOUS

## 2017-08-28 MED ORDER — DEXAMETHASONE SODIUM PHOSPHATE 10 MG/ML IJ SOLN
INTRAMUSCULAR | Status: DC | PRN
  Administered 2017-08-28: 10 mg via INTRAVENOUS

## 2017-08-28 MED ORDER — ONDANSETRON HCL 4 MG/2ML IJ SOLN
INTRAMUSCULAR | Status: AC
  Filled 2017-08-28: qty 2

## 2017-08-28 SURGICAL SUPPLY — 48 items
ADH SKN CLS APL DERMABOND .7 (GAUZE/BANDAGES/DRESSINGS) ×1
APPLIER CLIP 9.375 MED OPEN (MISCELLANEOUS)
APR CLP MED 9.3 20 MLT OPN (MISCELLANEOUS)
BINDER BREAST XLRG (GAUZE/BANDAGES/DRESSINGS) ×2 IMPLANT
BINDER BREAST XXLRG (GAUZE/BANDAGES/DRESSINGS) IMPLANT
BLADE SURG 15 STRL LF DISP TIS (BLADE) ×1 IMPLANT
BLADE SURG 15 STRL SS (BLADE) ×3
CANISTER SUC SOCK COL 7IN (MISCELLANEOUS) IMPLANT
CANISTER SUCT 1200ML W/VALVE (MISCELLANEOUS) IMPLANT
CHLORAPREP W/TINT 26ML (MISCELLANEOUS) ×3 IMPLANT
CLIP APPLIE 9.375 MED OPEN (MISCELLANEOUS) IMPLANT
COVER BACK TABLE 60X90IN (DRAPES) ×3 IMPLANT
COVER MAYO STAND STRL (DRAPES) ×3 IMPLANT
COVER PROBE W GEL 5X96 (DRAPES) ×3 IMPLANT
DECANTER SPIKE VIAL GLASS SM (MISCELLANEOUS) IMPLANT
DERMABOND ADVANCED (GAUZE/BANDAGES/DRESSINGS) ×2
DERMABOND ADVANCED .7 DNX12 (GAUZE/BANDAGES/DRESSINGS) ×1 IMPLANT
DEVICE DUBIN W/COMP PLATE 8390 (MISCELLANEOUS) ×3 IMPLANT
DRAPE LAPAROTOMY 100X72 PEDS (DRAPES) ×3 IMPLANT
DRAPE UTILITY XL STRL (DRAPES) ×3 IMPLANT
ELECT COATED BLADE 2.86 ST (ELECTRODE) ×3 IMPLANT
ELECT REM PT RETURN 9FT ADLT (ELECTROSURGICAL) ×3
ELECTRODE REM PT RTRN 9FT ADLT (ELECTROSURGICAL) ×1 IMPLANT
GLOVE BIOGEL PI IND STRL 8 (GLOVE) ×1 IMPLANT
GLOVE BIOGEL PI INDICATOR 8 (GLOVE) ×2
GLOVE SURG SS PI 6.5 STRL IVOR (GLOVE) ×2 IMPLANT
GLOVE SURG SS PI 8.0 STRL IVOR (GLOVE) ×2 IMPLANT
GOWN STRL REUS W/ TWL LRG LVL3 (GOWN DISPOSABLE) ×2 IMPLANT
GOWN STRL REUS W/TWL LRG LVL3 (GOWN DISPOSABLE) ×6
HEMOSTAT ARISTA ABSORB 3G PWDR (MISCELLANEOUS) IMPLANT
HEMOSTAT SNOW SURGICEL 2X4 (HEMOSTASIS) IMPLANT
KIT MARKER MARGIN INK (KITS) ×3 IMPLANT
NDL HYPO 25X1 1.5 SAFETY (NEEDLE) ×1 IMPLANT
NEEDLE HYPO 25X1 1.5 SAFETY (NEEDLE) ×3 IMPLANT
NS IRRIG 1000ML POUR BTL (IV SOLUTION) ×3 IMPLANT
PACK BASIN DAY SURGERY FS (CUSTOM PROCEDURE TRAY) ×3 IMPLANT
PENCIL BUTTON HOLSTER BLD 10FT (ELECTRODE) ×3 IMPLANT
SLEEVE SCD COMPRESS KNEE MED (MISCELLANEOUS) ×3 IMPLANT
SPONGE LAP 4X18 X RAY DECT (DISPOSABLE) ×3 IMPLANT
SUT MNCRL AB 4-0 PS2 18 (SUTURE) ×3 IMPLANT
SUT SILK 2 0 SH (SUTURE) IMPLANT
SUT VICRYL 3-0 CR8 SH (SUTURE) ×3 IMPLANT
SYR CONTROL 10ML LL (SYRINGE) ×3 IMPLANT
TOWEL OR 17X24 6PK STRL BLUE (TOWEL DISPOSABLE) ×3 IMPLANT
TOWEL OR NON WOVEN STRL DISP B (DISPOSABLE) ×3 IMPLANT
TUBE CONNECTING 20'X1/4 (TUBING)
TUBE CONNECTING 20X1/4 (TUBING) IMPLANT
YANKAUER SUCT BULB TIP NO VENT (SUCTIONS) IMPLANT

## 2017-08-28 NOTE — Transfer of Care (Signed)
Immediate Anesthesia Transfer of Care Note  Patient: Amy Lang  Procedure(s) Performed: BREAST LUMPECTOMY WITH RADIOACTIVE SEED LOCALIZATION (Right Breast)  Patient Location: PACU  Anesthesia Type:General  Level of Consciousness: awake, alert  and oriented  Airway & Oxygen Therapy: Patient Spontanous Breathing and Patient connected to face mask oxygen  Post-op Assessment: Report given to RN and Post -op Vital signs reviewed and stable  Post vital signs: Reviewed and stable  Last Vitals:  Vitals Value Taken Time  BP    Temp    Pulse 98 08/28/2017 12:55 PM  Resp 21 08/28/2017 12:55 PM  SpO2 100 % 08/28/2017 12:55 PM  Vitals shown include unvalidated device data.  Last Pain:  Vitals:   08/28/17 1153  TempSrc: Oral  PainSc: 0-No pain      Patients Stated Pain Goal: 0 (50/51/83 3582)  Complications: No apparent anesthesia complications

## 2017-08-28 NOTE — Discharge Instructions (Signed)
[facility] °Office Phone Number [phone] ° °BREAST BIOPSY/ PARTIAL MASTECTOMY: POST OP INSTRUCTIONS ° °Always review your discharge instruction sheet given to you by the facility where your surgery was performed. ° °IF YOU HAVE DISABILITY OR FAMILY LEAVE FORMS, YOU MUST BRING THEM TO THE OFFICE FOR PROCESSING.  DO NOT GIVE THEM TO YOUR DOCTOR. ° °1. A prescription for pain medication may be given to you upon discharge.  Take your pain medication as prescribed, if needed.  If narcotic pain medicine is not needed, then you may take acetaminophen (Tylenol) or ibuprofen (Advil) as needed. °2. Take your usually prescribed medications unless otherwise directed °3. If you need a refill on your pain medication, please contact your pharmacy.  They will contact our office to request authorization.  Prescriptions will not be filled after 5pm or on week-ends. °4. You should eat very light the first 24 hours after surgery, such as soup, crackers, pudding, etc.  Resume your normal diet the day after surgery. °5. Most patients will experience some swelling and bruising in the breast.  Ice packs and a good support bra will help.  Swelling and bruising can take several days to resolve.  °6. It is common to experience some constipation if taking pain medication after surgery.  Increasing fluid intake and taking a stool softener will usually help or prevent this problem from occurring.  A mild laxative (Milk of Magnesia or Miralax) should be taken according to package directions if there are no bowel movements after 48 hours. °7. Unless discharge instructions indicate otherwise, you may remove your bandages 24-48 hours after surgery, and you may shower at that time.  You may have steri-strips (small skin tapes) in place directly over the incision.  These strips should be left on the skin for 7-10 days.  If your surgeon used skin glue on the incision, you may shower in 24 hours.  The glue will flake off over the  next 2-3 weeks.  Any sutures or staples will be removed at the office during your follow-up visit. °8. ACTIVITIES:  You may resume regular daily activities (gradually increasing) beginning the next day.  Wearing a good support bra or sports bra minimizes pain and swelling.  You may have sexual intercourse when it is comfortable. °a. You may drive when you no longer are taking prescription pain medication, you can comfortably wear a seatbelt, and you can safely maneuver your car and apply brakes. °b. RETURN TO WORK:  ______________________________________________________________________________________ °9. You should see your doctor in the office for a follow-up appointment approximately two weeks after your surgery.  Your doctor’s nurse will typically make your follow-up appointment when she calls you with your pathology report.  Expect your pathology report 2-3 business days after your surgery.  You may call to check if you do not hear from us after three days. °10. OTHER INSTRUCTIONS: _______________________________________________________________________________________________ _____________________________________________________________________________________________________________________________________ °_____________________________________________________________________________________________________________________________________ °_____________________________________________________________________________________________________________________________________ ° °WHEN TO CALL YOUR DOCTOR: °1. Fever over 101.0 °2. Nausea and/or vomiting. °3. Extreme swelling or bruising. °4. Continued bleeding from incision. °5. Increased pain, redness, or drainage from the incision. ° °The clinic staff is available to answer your questions during regular business hours.  Please don’t hesitate to call and ask to speak to one of the nurses for clinical concerns.  If you have a medical emergency, go to the nearest  emergency room or call 911.  A surgeon from [facility] is always on call at the hospital. ° °For further questions, please visit [email or web address]  °

## 2017-08-28 NOTE — Anesthesia Procedure Notes (Signed)
Procedure Name: LMA Insertion Performed by: Lauran Romanski M, CRNA Pre-anesthesia Checklist: Patient identified, Emergency Drugs available, Suction available, Patient being monitored and Timeout performed Patient Re-evaluated:Patient Re-evaluated prior to induction Oxygen Delivery Method: Circle system utilized Preoxygenation: Pre-oxygenation with 100% oxygen Induction Type: IV induction LMA: LMA inserted LMA Size: 4.0 Number of attempts: 1 Placement Confirmation: CO2 detector,  positive ETCO2 and breath sounds checked- equal and bilateral Tube secured with: Tape Dental Injury: Teeth and Oropharynx as per pre-operative assessment        

## 2017-08-28 NOTE — Op Note (Signed)
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  Core biopsy was done which showed papilloma.  Excision recommended due to potential upgrade to a more malignant lesion upon excision.  The risk is 4-5% up to 20%.  This was discussed with the patient as well as observation.  She opted for a right breast lumpectomy after discussing the above.The procedure has been discussed with the patient. Alternatives to surgery have been discussed with the patient.  Risks of surgery include bleeding,  Infection,  Seroma formation, death,  and the need for further surgery.   The patient understands and wishes to proceed.  Description of procedure: The patient was met in the holding area.  Seed placed by radiology as an outpatient in her right breast for lumpectomy.  Questions were answered.  The right side was marked as the correct side.  She was then taken back to the operating room.  She was placed supine upon the operating room table.  After induction of LMA anesthesia, the right breast was prepped and draped in a sterile fashion.  Timeout was done to verify proper patient and procedure.  Local anesthetic was infiltrated into the right breast around the seed.  Curvilinear incision was made along the lateral border of the nipple areolar complex.  Dissection was carried down around the seed and clip.  All tissue around the seed and clip were excised with grossly negative margins.  Faxitron revealed both seed and clip to be in the specimen.  Hemostasis achieved.  Wound closed with 3-0 Vicryl and 4-0 Monocryl.   Dermabond applied.  Breast binder placed.  All final counts were found to be correct sponge, needles and instruments.  Breast binder placed.  The patient was then awoke extubated taken to recovery in satisfactory condition.

## 2017-08-28 NOTE — Anesthesia Postprocedure Evaluation (Signed)
Anesthesia Post Note  Patient: Amy Lang  Procedure(s) Performed: BREAST LUMPECTOMY WITH RADIOACTIVE SEED LOCALIZATION (Right Breast)     Patient location during evaluation: PACU Anesthesia Type: General Level of consciousness: awake and alert Pain management: pain level controlled Vital Signs Assessment: post-procedure vital signs reviewed and stable Respiratory status: spontaneous breathing, nonlabored ventilation, respiratory function stable and patient connected to nasal cannula oxygen Cardiovascular status: blood pressure returned to baseline and stable Postop Assessment: no apparent nausea or vomiting Anesthetic complications: no    Last Vitals:  Vitals:   08/28/17 1323 08/28/17 1400  BP:  (!) 148/79  Pulse: 79 75  Resp: 17 16  Temp:  36.4 C  SpO2: 100% 100%    Last Pain:  Vitals:   08/28/17 1400  TempSrc:   PainSc: 0-No pain                 Montez Hageman

## 2017-08-28 NOTE — Interval H&P Note (Signed)
History and Physical Interval Note:  08/28/2017 11:38 AM  Amy Lang  has presented today for surgery, with the diagnosis of RIGHT BREAST PAPILLOMA  The various methods of treatment have been discussed with the patient and family. After consideration of risks, benefits and other options for treatment, the patient has consented to  Procedure(s): BREAST LUMPECTOMY WITH RADIOACTIVE SEED LOCALIZATION (Right) as a surgical intervention .  The patient's history has been reviewed, patient examined, no change in status, stable for surgery.  I have reviewed the patient's chart and labs.  Questions were answered to the patient's satisfaction.     [place]

## 2017-08-28 NOTE — Anesthesia Preprocedure Evaluation (Signed)
Anesthesia Evaluation  Patient identified by MRN, date of birth, ID band Patient awake    Reviewed: Allergy & Precautions, NPO status , Patient's Chart, lab work & pertinent test results  History of Anesthesia Complications (+) PONV  Airway Mallampati: II  TM Distance: >3 FB Neck ROM: Full    Dental no notable dental hx.    Pulmonary asthma ,    Pulmonary exam normal breath sounds clear to auscultation       Cardiovascular hypertension, Pt. on medications Normal cardiovascular exam Rhythm:Regular Rate:Normal     Neuro/Psych negative neurological ROS  negative psych ROS   GI/Hepatic negative GI ROS, Neg liver ROS,   Endo/Other  negative endocrine ROS  Renal/GU negative Renal ROS  negative genitourinary   Musculoskeletal negative musculoskeletal ROS (+)   Abdominal   Peds negative pediatric ROS (+)  Hematology negative hematology ROS (+)   Anesthesia Other Findings   Reproductive/Obstetrics negative OB ROS                             Anesthesia Physical Anesthesia Plan  ASA: II  Anesthesia Plan: General   Post-op Pain Management:    Induction: Intravenous  PONV Risk Score and Plan: 4 or greater and Ondansetron, Dexamethasone, Midazolam, Scopolamine patch - Pre-op and Treatment may vary due to age or medical condition  Airway Management Planned: LMA  Additional Equipment:   Intra-op Plan:   Post-operative Plan:   Informed Consent: I have reviewed the patients History and Physical, chart, labs and discussed the procedure including the risks, benefits and alternatives for the proposed anesthesia with the patient or authorized representative who has indicated his/her understanding and acceptance.   Dental advisory given  Plan Discussed with: CRNA  Anesthesia Plan Comments:         Anesthesia Quick Evaluation

## 2017-08-29 ENCOUNTER — Encounter (HOSPITAL_BASED_OUTPATIENT_CLINIC_OR_DEPARTMENT_OTHER): Payer: Self-pay | Admitting: Surgery

## 2017-09-25 ENCOUNTER — Other Ambulatory Visit: Payer: Self-pay | Admitting: Internal Medicine

## 2017-09-30 ENCOUNTER — Other Ambulatory Visit: Payer: Self-pay | Admitting: Internal Medicine

## 2017-10-01 ENCOUNTER — Other Ambulatory Visit (INDEPENDENT_AMBULATORY_CARE_PROVIDER_SITE_OTHER): Payer: Managed Care, Other (non HMO)

## 2017-10-01 ENCOUNTER — Ambulatory Visit (INDEPENDENT_AMBULATORY_CARE_PROVIDER_SITE_OTHER): Payer: Managed Care, Other (non HMO)

## 2017-10-01 DIAGNOSIS — G43109 Migraine with aura, not intractable, without status migrainosus: Secondary | ICD-10-CM | POA: Diagnosis not present

## 2017-10-01 DIAGNOSIS — E538 Deficiency of other specified B group vitamins: Secondary | ICD-10-CM

## 2017-10-01 DIAGNOSIS — Z23 Encounter for immunization: Secondary | ICD-10-CM | POA: Diagnosis not present

## 2017-10-01 DIAGNOSIS — I10 Essential (primary) hypertension: Secondary | ICD-10-CM

## 2017-10-01 DIAGNOSIS — J4520 Mild intermittent asthma, uncomplicated: Secondary | ICD-10-CM

## 2017-10-01 DIAGNOSIS — Z0000 Encounter for general adult medical examination without abnormal findings: Secondary | ICD-10-CM

## 2017-10-01 LAB — HEPATIC FUNCTION PANEL
ALK PHOS: 90 U/L (ref 39–117)
ALT: 33 U/L (ref 0–35)
AST: 25 U/L (ref 0–37)
Albumin: 4.3 g/dL (ref 3.5–5.2)
BILIRUBIN TOTAL: 0.5 mg/dL (ref 0.2–1.2)
Bilirubin, Direct: 0.1 mg/dL (ref 0.0–0.3)
Total Protein: 7.4 g/dL (ref 6.0–8.3)

## 2017-10-01 LAB — URINALYSIS
Bilirubin Urine: NEGATIVE
HGB URINE DIPSTICK: NEGATIVE
KETONES UR: NEGATIVE
Leukocytes, UA: NEGATIVE
Nitrite: NEGATIVE
SPECIFIC GRAVITY, URINE: 1.025 (ref 1.000–1.030)
Total Protein, Urine: NEGATIVE
URINE GLUCOSE: NEGATIVE
UROBILINOGEN UA: 0.2 (ref 0.0–1.0)
pH: 6 (ref 5.0–8.0)

## 2017-10-01 LAB — CBC WITH DIFFERENTIAL/PLATELET
Basophils Absolute: 0 10*3/uL (ref 0.0–0.1)
Basophils Relative: 0.4 % (ref 0.0–3.0)
EOS ABS: 0 10*3/uL (ref 0.0–0.7)
Eosinophils Relative: 0.1 % (ref 0.0–5.0)
HCT: 42.9 % (ref 36.0–46.0)
Hemoglobin: 14.5 g/dL (ref 12.0–15.0)
LYMPHS ABS: 2.1 10*3/uL (ref 0.7–4.0)
Lymphocytes Relative: 34.9 % (ref 12.0–46.0)
MCHC: 33.8 g/dL (ref 30.0–36.0)
MCV: 90.1 fl (ref 78.0–100.0)
MONO ABS: 0.5 10*3/uL (ref 0.1–1.0)
Monocytes Relative: 8.2 % (ref 3.0–12.0)
NEUTROS PCT: 56.4 % (ref 43.0–77.0)
Neutro Abs: 3.3 10*3/uL (ref 1.4–7.7)
PLATELETS: 187 10*3/uL (ref 150.0–400.0)
RBC: 4.77 Mil/uL (ref 3.87–5.11)
RDW: 12.7 % (ref 11.5–15.5)
WBC: 5.9 10*3/uL (ref 4.0–10.5)

## 2017-10-01 LAB — BASIC METABOLIC PANEL
BUN: 14 mg/dL (ref 6–23)
CALCIUM: 9.2 mg/dL (ref 8.4–10.5)
CO2: 23 meq/L (ref 19–32)
CREATININE: 0.73 mg/dL (ref 0.40–1.20)
Chloride: 108 mEq/L (ref 96–112)
GFR: 88.32 mL/min (ref >60.00)
GLUCOSE: 172 mg/dL — AB (ref 70–99)
Potassium: 4.4 mEq/L (ref 3.5–5.1)
Sodium: 142 mEq/L (ref 135–145)

## 2017-10-01 LAB — LIPID PANEL
CHOL/HDL RATIO: 4
Cholesterol: 177 mg/dL (ref 0–200)
HDL: 45.7 mg/dL (ref >39.00)
NonHDL: 131.4
Triglycerides: 332 mg/dL — ABNORMAL HIGH (ref 0.0–149.0)
VLDL: 66.4 mg/dL — ABNORMAL HIGH (ref 0.0–40.0)

## 2017-10-01 LAB — VITAMIN D 25 HYDROXY (VIT D DEFICIENCY, FRACTURES): VITD: 17.9 ng/mL — ABNORMAL LOW (ref 30.00–100.00)

## 2017-10-01 LAB — LDL CHOLESTEROL, DIRECT: LDL DIRECT: 96 mg/dL

## 2017-10-01 LAB — VITAMIN B12: Vitamin B-12: 446 pg/mL (ref 211–911)

## 2017-10-01 LAB — TSH: TSH: 2.24 u[IU]/mL (ref 0.35–4.50)

## 2017-10-02 ENCOUNTER — Other Ambulatory Visit: Payer: Self-pay | Admitting: Internal Medicine

## 2017-10-02 MED ORDER — VITAMIN D3 1.25 MG (50000 UT) PO CAPS: 1.0000 | ORAL_CAPSULE | ORAL | 0 refills | Status: DC

## 2017-11-12 ENCOUNTER — Ambulatory Visit: Payer: Managed Care, Other (non HMO) | Admitting: Internal Medicine

## 2017-11-13 ENCOUNTER — Other Ambulatory Visit: Payer: Self-pay | Admitting: Internal Medicine

## 2017-11-19 ENCOUNTER — Encounter: Payer: Self-pay | Admitting: Internal Medicine

## 2017-11-19 ENCOUNTER — Other Ambulatory Visit (INDEPENDENT_AMBULATORY_CARE_PROVIDER_SITE_OTHER): Payer: Managed Care, Other (non HMO)

## 2017-11-19 ENCOUNTER — Ambulatory Visit (INDEPENDENT_AMBULATORY_CARE_PROVIDER_SITE_OTHER): Payer: Managed Care, Other (non HMO) | Admitting: Internal Medicine

## 2017-11-19 DIAGNOSIS — R739 Hyperglycemia, unspecified: Secondary | ICD-10-CM

## 2017-11-19 DIAGNOSIS — E785 Hyperlipidemia, unspecified: Secondary | ICD-10-CM

## 2017-11-19 DIAGNOSIS — I10 Essential (primary) hypertension: Secondary | ICD-10-CM | POA: Diagnosis not present

## 2017-11-19 DIAGNOSIS — E669 Obesity, unspecified: Secondary | ICD-10-CM

## 2017-11-19 DIAGNOSIS — E538 Deficiency of other specified B group vitamins: Secondary | ICD-10-CM

## 2017-11-19 LAB — LIPID PANEL
Cholesterol: 185 mg/dL (ref 0–200)
HDL: 45.4 mg/dL (ref >39.00)
LDL Cholesterol: 101 mg/dL — ABNORMAL HIGH (ref 0–99)
NONHDL: 139.62
Total CHOL/HDL Ratio: 4
Triglycerides: 194 mg/dL — ABNORMAL HIGH (ref 0.0–149.0)
VLDL: 38.8 mg/dL (ref 0.0–40.0)

## 2017-11-19 LAB — BASIC METABOLIC PANEL
BUN: 17 mg/dL (ref 6–23)
CHLORIDE: 109 meq/L (ref 96–112)
CO2: 23 meq/L (ref 19–32)
Calcium: 9.2 mg/dL (ref 8.4–10.5)
Creatinine, Ser: 0.74 mg/dL (ref 0.40–1.20)
GFR: 86.9 mL/min (ref >60.00)
Glucose, Bld: 126 mg/dL — ABNORMAL HIGH (ref 70–99)
POTASSIUM: 4.4 meq/L (ref 3.5–5.1)
SODIUM: 140 meq/L (ref 135–145)

## 2017-11-19 LAB — HEMOGLOBIN A1C: Hgb A1c MFr Bld: 6.8 % — ABNORMAL HIGH (ref 4.6–6.5)

## 2017-11-19 MED ORDER — METFORMIN HCL 500 MG PO TABS: 500.0000 mg | ORAL_TABLET | Freq: Every day | ORAL | 3 refills | Status: DC

## 2017-11-19 NOTE — Assessment & Plan Note (Signed)
On B12 

## 2017-11-19 NOTE — Patient Instructions (Signed)
Hemoglobin A1c Test Some of the sugar (glucose) that circulates in your blood sticks or binds to blood proteins. Hemoglobin (Hb or Hgb) is one type of blood protein that glucose binds to. It also carries oxygen in the red blood cells (RBCs). When glucose binds to Hb, the glucose-coated Hb is called glycated Hb. Once Hb is glycated, it remains that way for the life of the RBC. This is about 120 days. Rather than testing your blood glucose level on one single day, the hemoglobin A1c (HbA1c) test measures the average amount of glycated hemoglobin and, therefore, the average amount of glucose in your blood during the 3-4 months just before the test is done. The HbA1c test is used to monitor long-term control of blood sugar in people who have diabetes mellitus. The HbA1c test can also be used in addition to or in combination with fasting blood glucose level and oral glucose tolerance tests. What do the results mean? It is your responsibility to obtain your test results. Ask the lab or department performing the test when and how you will get your results. Contact your health care provider to discuss any questions you have about your results. Range of Normal Values Ranges for normal values may vary among different labs and hospitals. You should always check with your health care provider after having lab work or other tests done to discuss the meaning of your test results and whether your values are considered within normal limits. The ranges for normal HbA1c test results are as follows:  Adult or child without diabetes: 4-5.9%.  Adult or child with diabetes and good blood glucose control: less than 6.5%.  Several factors can affect HbA1c test results. These may include:  Diseases (hemoglobinopathies) that cause a change in the shape, size, or amount of Hb in your blood.  Longer than normal RBC life span.  Abnormally low levels of certain proteins in your blood.  Eating foods or taking supplements that  are high in vitamin C (ascorbic acid).  Meaning of Results Outside Normal Value Ranges Abnormally high HbA1c values are most commonly an indication of prediabetes mellitus and diabetes mellitus:  An HbA1c result of 5.7-6.4% is considered diagnostic of prediabetes mellitus.  An HbA1c result of 6.5% or higher on two separate occasions is considered diagnostic of diabetes mellitus.  Abnormally low HbA1c values can be caused by several health conditions. These may include:  Pregnancy.  A large amount of blood loss.  Blood transfusions.  Low red blood cell count (anemia). This is caused by premature destruction of red blood cells.  Long-term kidney failure.  Some unusual forms of Hb (Hb variants), such as sickle cell trait.  Discuss your test results with your health care provider. He or she will use the results to make a diagnosis and determine a treatment plan that is right for you. Talk with your health care provider to discuss your results, treatment options, and if necessary, the need for more tests. Talk with your health care provider if you have any questions about your results. This information is not intended to replace advice given to you by your health care provider. Make sure you discuss any questions you have with your health care provider. Document Released: 06/06/2004 Document Revised: 02/09/2016 Document Reviewed: 09/29/2013 Elsevier Interactive Patient Education  2018 Elsevier Inc.  

## 2017-11-19 NOTE — Progress Notes (Signed)
Subjective:  Patient ID: Amy Lang, female    DOB: Oct 11, 1965  Age: 52 y.o. MRN: [id number]  CC: No chief complaint on file.   HPI TRINIDEE SCHRAG presents for elev glu, TG, obesity Lost 5 lbs on diet - she was ref to Dr Leafy Ro  Outpatient Medications Prior to Visit  Medication Sig Dispense Refill  . ALPRAZolam (XANAX) 0.5 MG tablet Take 1 tablet (0.5 mg total) by mouth 3 (three) times daily as needed for anxiety. 60 tablet 2  . aspirin EC 81 MG tablet Take 81 mg by mouth every evening.     . cetirizine (ZYRTEC) 10 MG tablet Take 10 mg by mouth daily.     . Cholecalciferol (VITAMIN D3) 2000 UNITS capsule Take 1 capsule (2,000 Units total) by mouth daily. 100 capsule 3  . Cholecalciferol (VITAMIN D3) 50000 units CAPS Take 1 capsule by mouth once a week. 8 capsule 0  . cyanocobalamin (,VITAMIN B-12,) 1000 MCG/ML injection 1 ml IM or SQ every 2 weeks (lifetime) 10 mL 5  . eletriptan (RELPAX) 40 MG tablet May repeat in 2 hours if headache persists or recurs. 30 tablet 2  . FLUoxetine (PROZAC) 20 MG tablet Take 1 tablet (20 mg total) by mouth daily. 30 tablet 5  . fluticasone (FLONASE) 50 MCG/ACT nasal spray Place 2 sprays into both nostrils daily as needed. 54 g 3  . ibuprofen (ADVIL,MOTRIN) 600 MG tablet Take 1 tablet (600 mg total) by mouth every 6 (six) hours as needed (mild pain). 30 tablet 2  . ibuprofen (ADVIL,MOTRIN) 800 MG tablet Take 1 tablet (800 mg total) by mouth every 8 (eight) hours as needed. 30 tablet 0  . losartan (COZAAR) 100 MG tablet Take 1 tablet (100 mg total) by mouth daily. 90 tablet 3  . Multiple Vitamin (MULTIVITAMIN WITH MINERALS) TABS Take 1 tablet by mouth daily.    . Multiple Vitamins-Minerals (PRESERVISION AREDS 2) CAPS Take 1 capsule by mouth at bedtime.    . Omega-3 Fatty Acids (FISH OIL) 1000 MG CAPS Take 1,000 mg by mouth daily.    . ondansetron (ZOFRAN) 4 MG tablet Take 1 tablet (4 mg total) by mouth every 4 (four) hours as needed for nausea or  vomiting. And diarrhea 90 tablet 3  . pantoprazole (PROTONIX) 20 MG tablet Take 1 tablet (20 mg total) by mouth daily before breakfast. 90 tablet 3  . Probiotic Product (ALIGN) 4 MG CAPS Take 1 capsule by mouth daily. 30 capsule 1  . topiramate (TOPAMAX) 200 MG tablet Take 1 tablet (200 mg total) by mouth daily. Must keep OV in March to get refills 90 tablet 3  . vitamin C (ASCORBIC ACID) 500 MG tablet Take 500 mg by mouth daily.     [name] venlafaxine XR (EFFEXOR XR) 37.5 MG 24 hr capsule Take 75 mg x 2 weeks, then 37.5 mg x 2 weeks, then 37.5 mg qod x 1 week, then stop 50 capsule 0   No facility-administered medications prior to visit.     ROS: Review of Systems  Constitutional: Negative for activity change, appetite change, chills, fatigue and unexpected weight change.  HENT: Negative for congestion, mouth sores and sinus pressure.   Eyes: Negative for visual disturbance.  Respiratory: Negative for cough and chest tightness.   Gastrointestinal: Negative for abdominal pain and nausea.  Genitourinary: Negative for difficulty urinating, frequency and vaginal pain.  Musculoskeletal: Negative for back pain and gait problem.  Skin: Negative for pallor and rash.  Neurological:  Negative for dizziness, tremors, weakness, numbness and headaches.  Psychiatric/Behavioral: Negative for confusion, sleep disturbance and suicidal ideas.    Objective:  BP 128/84 (BP Location: Left Arm, Patient Position: Sitting, Cuff Size: Large)   Pulse 63   Temp 98.5 F (36.9 C) (Oral)   Ht 5\' 1"  (1.549 m)   Wt 178 lb (80.7 kg)   SpO2 98%   BMI 33.63 kg/m   BP Readings from Last 3 Encounters:  11/19/17 128/84  08/28/17 (!) 148/79  08/20/17 122/74    Wt Readings from Last 3 Encounters:  11/19/17 178 lb (80.7 kg)  08/28/17 182 lb 12.8 oz (82.9 kg)  08/20/17 182 lb (82.6 kg)    Physical Exam  Constitutional: She appears well-developed. No distress.  HENT:  Head: Normocephalic.  Right Ear: External ear  normal.  Left Ear: External ear normal.  Nose: Nose normal.  Mouth/Throat: Oropharynx is clear and moist.  Eyes: Pupils are equal, round, and reactive to light. Conjunctivae are normal. Right eye exhibits no discharge. Left eye exhibits no discharge.  Neck: Normal range of motion. Neck supple. No JVD present. No tracheal deviation present. No thyromegaly present.  Cardiovascular: Normal rate, regular rhythm and normal heart sounds.  Pulmonary/Chest: No stridor. No respiratory distress. She has no wheezes.  Abdominal: Soft. Bowel sounds are normal. She exhibits no distension and no mass. There is no tenderness. There is no rebound and no guarding.  Musculoskeletal: She exhibits no edema or tenderness.  Lymphadenopathy:    She has no cervical adenopathy.  Neurological: She displays normal reflexes. No cranial nerve deficit. She exhibits normal muscle tone. Coordination normal.  Skin: No rash noted. No erythema.  Psychiatric: She has a normal mood and affect. Her behavior is normal. Judgment and thought content normal.    Lab Results  Component Value Date   WBC 5.9 10/01/2017   HGB 14.5 10/01/2017   HCT 42.9 10/01/2017   PLT 187.0 10/01/2017   GLUCOSE 172 (H) 10/01/2017   CHOL 177 10/01/2017   TRIG 332.0 (H) 10/01/2017   HDL 45.70 10/01/2017   LDLDIRECT 96.0 10/01/2017   LDLCALC 64 08/14/2016   ALT 33 10/01/2017   AST 25 10/01/2017   NA 142 10/01/2017   K 4.4 10/01/2017   CL 108 10/01/2017   CREATININE 0.73 10/01/2017   BUN 14 10/01/2017   CO2 23 10/01/2017   TSH 2.24 10/01/2017    Mm Breast Surgical Specimen  Result Date: 08/28/2017 CLINICAL DATA:  Post right breast excision. EXAM: SPECIMEN RADIOGRAPH OF THE RIGHT BREAST COMPARISON:  Previous exam(s). FINDINGS: Status post excision of the right breast. The radioactive seed and biopsy marker clip are present, completely intact, and were marked for pathology. IMPRESSION: Specimen radiograph of the right breast. Electronically  Signed   By: Everlean Alstrom M.D.   On: 08/28/2017 12:39    Assessment & Plan:   There are no diagnoses linked to this encounter.   No orders of the defined types were placed in this encounter.    Follow-up: No follow-ups on file.  Walker Kehr, MD

## 2017-11-19 NOTE — Assessment & Plan Note (Signed)
BP Readings from Last 3 Encounters:  11/19/17 128/84  08/28/17 (!) 148/79  08/20/17 122/74

## 2017-11-19 NOTE — Assessment & Plan Note (Addendum)
Labs Start metformin 500 mg daily

## 2017-11-19 NOTE — Assessment & Plan Note (Signed)
Labs

## 2017-11-19 NOTE — Assessment & Plan Note (Signed)
She was ref to Dr Leafy Ro 5/19

## 2017-12-03 DIAGNOSIS — G5603 Carpal tunnel syndrome, bilateral upper limbs: Secondary | ICD-10-CM | POA: Insufficient documentation

## 2017-12-17 ENCOUNTER — Ambulatory Visit: Payer: Managed Care, Other (non HMO)

## 2017-12-17 ENCOUNTER — Ambulatory Visit (INDEPENDENT_AMBULATORY_CARE_PROVIDER_SITE_OTHER): Payer: Managed Care, Other (non HMO)

## 2017-12-17 DIAGNOSIS — Z299 Encounter for prophylactic measures, unspecified: Secondary | ICD-10-CM

## 2017-12-28 ENCOUNTER — Encounter: Payer: Self-pay | Admitting: Cardiology

## 2017-12-28 ENCOUNTER — Ambulatory Visit (INDEPENDENT_AMBULATORY_CARE_PROVIDER_SITE_OTHER): Payer: Managed Care, Other (non HMO) | Admitting: Cardiology

## 2017-12-28 VITALS — BP 122/72 | HR 69 | Ht 61.0 in | Wt 178.4 lb

## 2017-12-28 DIAGNOSIS — R739 Hyperglycemia, unspecified: Secondary | ICD-10-CM

## 2017-12-28 DIAGNOSIS — I10 Essential (primary) hypertension: Secondary | ICD-10-CM | POA: Diagnosis not present

## 2017-12-28 DIAGNOSIS — Z8249 Family history of ischemic heart disease and other diseases of the circulatory system: Secondary | ICD-10-CM | POA: Diagnosis not present

## 2017-12-28 DIAGNOSIS — E8881 Metabolic syndrome: Secondary | ICD-10-CM

## 2017-12-28 DIAGNOSIS — E782 Mixed hyperlipidemia: Secondary | ICD-10-CM | POA: Diagnosis not present

## 2017-12-28 DIAGNOSIS — E669 Obesity, unspecified: Secondary | ICD-10-CM

## 2017-12-28 NOTE — Progress Notes (Signed)
PCP: Plotnikov, Evie Lacks, MD  Clinic Note: Chief Complaint  Patient presents with  . Follow-up    Delayed visit.  No points    HPI: Amy Lang is a 52 y.o. female with a PMH below who presents today for 43 month f/u -- She has a Strong FH of premature CAD (Mother had MI @ 77) & her own CRFs = HTN, HLD, obesity & now DM-2. -- is statin intolerant. (I have not seen her since Oct 2016)  [name] was last seen in March 2018 by Cecilie Kicks, NP - she ran out of BP meds.  Also noted some wgt gain. NO Cardiac Sx.  -- FLP ordered.  Recommended increased exercise. She was then followed by Pharm-D for HTN management - seen in March & April 2018 -->  -- PCP Dx'd DM-2 - started Metformin.  Lipids better controlled with diet & exercise / wgt loss - TG levels are up - but better.  -- has cut out sweet tea & sodas.  Recent Hospitalizations:   August 28, 2017: Breast lumpectomy for right breast papilloma.  Studies Personally Reviewed - (if available, images/films reviewed: From Epic Chart or Care Everywhere) N/a  Interval History: [name] presents here today for delayed cardiology follow-up basically for "metabolic syndrome ".  She now has hyperglycemia/borderline diabetes on metformin along with hypertension, obesity and hyperlipidemia.  She herself has not had any cardiac symptoms to speak of.  No chest tightness pressure with rest or exertion.  She does note that she has not been all that active, was supposed to be exercising on her stationary bicycle, but does not exercise very routinely.  Now that she has been diagnosed with diabetes however she has made a more conscious effort to adjust her diet exercise.  She is trying to lose weight and hopes to maybe get off the metformin. She has little deconditioning, and does get some exertional dyspnea, but denies any exertional chest pain.  No resting chest pain or dyspnea.  Remainder cardiovascular review of symptoms:  No PND, orthopnea or edema.   No palpitations, lightheadedness, dizziness, weakness or syncope/near syncope. No TIA/amaurosis fugax symptoms.  No claudication.  ROS: A comprehensive was performed. Review of Systems  Constitutional: Negative for malaise/fatigue and weight loss (Not like she was hoping).  HENT: Negative for nosebleeds.   Gastrointestinal: Negative for abdominal pain, blood in stool, constipation, heartburn and melena.  Genitourinary: Negative for hematuria.  Neurological: Negative for dizziness, focal weakness and weakness.  Endo/Heme/Allergies: Does not bruise/bleed easily.  Psychiatric/Behavioral: The patient is nervous/anxious (Very nervous and anxious about being diagnosed with diabetes).   All other systems reviewed and are negative.  I have reviewed and (if needed) personally updated the patient's problem list, medications, allergies, past medical and surgical history, social and family history.   Past Medical History:  Diagnosis Date  . Allergy   . Anxiety   . Asthma   . Depression   . Dyslipidemia   . Hypertension   . IBS (irritable bowel syndrome)   . Migraines   . Obesity   . PONV (postoperative nausea and vomiting)   . Shortness of breath   . 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 ABDOMINAL HYSTERECTOMY     still has bilateral ovaries  . BREAST LUMPECTOMY WITH RADIOACTIVE SEED LOCALIZATION Right 08/28/2017   Procedure: BREAST LUMPECTOMY WITH RADIOACTIVE SEED LOCALIZATION;  Surgeon: Erroll Luna, MD;  Location: [facility];  Service: General;  Laterality: Right;  . CHOLECYSTECTOMY  04-04-11   single site  . COLONOSCOPY W/ BIOPSIES  01/24/2013  . ESOPHAGOGASTRODUODENOSCOPY    . KNEE SURGERY    . LAPAROTOMY  05/12/2012   Procedure: LAPAROTOMY;  Surgeon: Cyril Mourning, MD;  Location: [facility];  Service: Gynecology;;  laparotomy [name] salpingectomy-oopharectomy  . NM MYOCAR PERF WALL MOTION  09/06/2009   normal  .  TRANSTHORACIC ECHOCARDIOGRAM  March 2014   Normal regional wall motion. EF 55%. Mild MR    Current Meds  Medication Sig  . ALPRAZolam (XANAX) 0.5 MG tablet Take 1 tablet (0.5 mg total) by mouth 3 (three) times daily as needed for anxiety.  [name] aspirin EC 81 MG tablet Take 81 mg by mouth every evening.   . cetirizine (ZYRTEC) 10 MG tablet Take 10 mg by mouth daily.   . Cholecalciferol (VITAMIN D3) 50000 units CAPS Take 1 capsule by mouth once a week.  . cyanocobalamin (,VITAMIN B-12,) 1000 MCG/ML injection 1 ml IM or SQ every 2 weeks (lifetime)  . eletriptan (RELPAX) 40 MG tablet May repeat in 2 hours if headache persists or recurs.  [name] FLUoxetine (PROZAC) 20 MG tablet Take 1 tablet (20 mg total) by mouth daily.  . fluticasone (FLONASE) 50 MCG/ACT nasal spray Place 2 sprays into both nostrils daily as needed.  [name] ibuprofen (ADVIL,MOTRIN) 600 MG tablet Take 1 tablet (600 mg total) by mouth every 6 (six) hours as needed (mild pain).  [name] ibuprofen (ADVIL,MOTRIN) 800 MG tablet Take 1 tablet (800 mg total) by mouth every 8 (eight) hours as needed.  [name] losartan (COZAAR) 100 MG tablet Take 1 tablet (100 mg total) by mouth daily.  . metFORMIN (GLUCOPHAGE) 500 MG tablet Take 1 tablet (500 mg total) by mouth daily with breakfast.  . Multiple Vitamin (MULTIVITAMIN WITH MINERALS) TABS Take 1 tablet by mouth daily.  . Multiple Vitamins-Minerals (PRESERVISION AREDS 2) CAPS Take 1 capsule by mouth at bedtime.  . Omega-3 Fatty Acids (FISH OIL) 1000 MG CAPS Take 1,000 mg by mouth daily.  . ondansetron (ZOFRAN) 4 MG tablet Take 1 tablet (4 mg total) by mouth every 4 (four) hours as needed for nausea or vomiting. And diarrhea  . pantoprazole (PROTONIX) 20 MG tablet Take 1 tablet (20 mg total) by mouth daily before breakfast.  . Probiotic Product (ALIGN) 4 MG CAPS Take 1 capsule by mouth daily.  [name] topiramate (TOPAMAX) 200 MG tablet Take 1 tablet (200 mg total) by mouth daily. Must keep OV in March to get refills  .  vitamin C (ASCORBIC ACID) 500 MG tablet Take 500 mg by mouth daily.   . [DISCONTINUED] Cholecalciferol (VITAMIN D3) 2000 UNITS capsule Take 1 capsule (2,000 Units total) by mouth daily.    Allergies  Allergen Reactions  . Bee Pollen Anaphylaxis  . Butorphanol   . Butorphanol Tartrate Other (See Comments)    REACTION: hallucinations ( stadol)  . Codeine   . Codeine Sulfate Hives and Nausea And Vomiting  . Crestor [Rosuvastatin] Other (See Comments)    Body aches  . Darvocet [Propoxyphene N-Acetaminophen] Hives and Nausea And Vomiting  . Estrogens   . Levofloxacin Other (See Comments)    abdominal pain  . Penicillamine   . Penicillins Other (See Comments)    Seizure   . Sulfa Antibiotics Hives and Nausea And Vomiting  . Latex Rash    Blisters and redness with bandaids-also sensitivity to latex gloves    Social History  Tobacco Use  . Smoking status: Never Smoker  . Smokeless tobacco: Never Used  . Tobacco comment: Regular Exercise - No  Substance Use Topics  . Alcohol use: No  . Drug use: No   Social History   Social History Narrative   [name] with no children. Never smoked. Occasional alcohol.   Works as a Haematologist at [employer]. Is on her feet but 6 days per week.   She does a treadmill at home and walking outside of these 3-4 times a week.   Diet: Avoids fried food and junk food. Drinks diet snapple and green tea as well as water. Used to drink sweet tea but many family members have DM. Does like salty foods but has tried to limit this  family history includes Breast cancer (age of onset: 67) in her maternal grandmother; COPD in her mother; Heart attack (age of onset: 41) in her mother; Heart disease in her maternal grandfather, maternal grandmother, mother, paternal grandfather, and paternal grandmother; Lung cancer in her father.  Mother died @ 50.  Wt Readings from Last 3 Encounters:  12/28/17 178 lb 6.4 oz (80.9 kg)  11/19/17 178 lb  (80.7 kg)  08/28/17 182 lb 12.8 oz (82.9 kg)    PHYSICAL EXAM BP 122/72   Pulse 69   Ht 5\' 1"  (1.549 m)   Wt 178 lb 6.4 oz (80.9 kg)   BMI 33.71 kg/m  Physical Exam  Constitutional: She is oriented to person, place, and time. She appears well-developed and well-nourished.  Well-groomed.  Mildly obese.[name]  HENT:  Head: Normocephalic and atraumatic.  Neck: Normal range of motion. Neck supple. No hepatojugular reflux and no JVD present. Carotid bruit is not present. No thyromegaly present.  Cardiovascular: Normal rate, regular rhythm, normal heart sounds and intact distal pulses.  No extrasystoles are present. PMI is not displaced (Difficult to palpate). Exam reveals no gallop and no friction rub.  No murmur heard. Pulmonary/Chest: Effort normal and breath sounds normal. No respiratory distress. She has no wheezes. She has no rales.  Abdominal: Soft. Bowel sounds are normal. She exhibits no distension. There is no tenderness. There is no rebound.  Trouble obesity.  Unable to palpate HSM  Musculoskeletal: Normal range of motion. She exhibits edema (Trivial).  Neurological: She is alert and oriented to person, place, and time.  Psychiatric: She has a normal mood and affect. Her behavior is normal. Judgment and thought content normal.  Somewhat anxious appearing.  Vitals reviewed.    Adult ECG Report  none  Other studies Reviewed: Additional studies/ records that were reviewed today include:  Recent Labs:   Lab Results  Component Value Date   CHOL 185 11/19/2017   HDL 45.40 11/19/2017   LDLCALC 101 (H) 11/19/2017   LDLDIRECT 96.0 10/01/2017   TRIG 194.0 (H) 11/19/2017   CHOLHDL 4 11/19/2017   Lab Results  Component Value Date   CREATININE 0.74 11/19/2017   BUN 17 11/19/2017   NA 140 11/19/2017   K 4.4 11/19/2017   CL 109 11/19/2017   CO2 23 11/19/2017   Lab Results  Component Value Date   HGBA1C 6.8 (H) 11/19/2017   ASSESSMENT / PLAN: Problem List Items Addressed  This Visit    Essential hypertension (Chronic)    Well-controlled on current dose of losartan.  This is appropriate given her new diagnosis of diabetes.      Relevant Orders   CT CARDIAC SCORING   Family history of coronary artery disease -  Primary (Chronic)    She has had a relatively normal cardiac evaluation in the past.  With her extensive family history and her cardiac risk factors of metabolic syndrome, we discussed screening studies to evaluate for stratification. Plan: We will check CT calcium score as a baseline.  Depending on this results may want to be more aggressive with therapy, or may want to proceed with more definitive evaluation with a coronary CT angiogram.      Relevant Orders   CT CARDIAC SCORING   Hyperglycemia (Chronic)   Relevant Orders   CT CARDIAC SCORING   Hyperlipidemia (Chronic)    Just had labs checked.  Is not currently on a statin.  Plan is to work with lifestyle modifications first.  With her other cardiac risk factors (metabolic syndrome) and family history, target LDL may be closer to 70.  Low threshold for considering treatment.  In the past, she was intolerant of Crestor.  Would probably consider pravastatin 20 to 40 mg as the next option.  If still unable to control.  May require referral to lipid clinic.      Relevant Orders   CT CARDIAC SCORING   Metabolic syndrome (Chronic)    HTN, Obesity, DM-2 & high TG levels = 4 of 5 criteria for Metabolic Syndrome.  With her Family History, she is at increased risk of CAD -- I discussed increased risk & need for close BP, glycemic & lipid management along with weight loss.        Relevant Orders   CT CARDIAC SCORING   Obesity (BMI 30-39.9) (Chronic)    The patient understands the need to lose weight with diet and exercise. We have discussed specific strategies for this. Referred to Dr. Leafy Ro by PCP.      Relevant Orders   CT CARDIAC SCORING       I spent a total of 84minutes with the patient  and chart review. >  50% of the time was spent in direct patient consultation.   Current medicines are reviewed at length with the patient today.  (+/- concerns) n/a The following changes have been made:  n/a  Patient Instructions  Dr. Ellyn Hack has ordered a CT coronary calcium score. This test is done at [address]. This is $150 out of pocket.  Your physician wants you to follow-up in: ONE YEAR with Dr. Ellyn Hack. You will receive a reminder letter in the mail two months in advance. If you don't receive a letter, please call our office to schedule the follow-up appointment.   Studies Ordered:   Orders Placed This Encounter  Procedures  . CT CARDIAC SCORING      Glenetta Hew, M.D., M.S. Interventional Cardiologist   Pager # [phone] Phone # [phone] [address] [place], [place] [address]   Thank you for choosing [facility] at [facility]!!

## 2017-12-28 NOTE — Patient Instructions (Addendum)
Dr. Ellyn Hack has ordered a CT coronary calcium score. This test is done at [address]. This is $150 out of pocket.  Your physician wants you to follow-up in: ONE YEAR with Dr. Ellyn Hack. You will receive a reminder letter in the mail two months in advance. If you don't receive a letter, please call our office to schedule the follow-up appointment.   Coronary CalciumScan A coronary calcium scan is an imaging test used to look for deposits of calcium and other fatty materials (plaques) in the inner lining of the blood vessels of the heart (coronary arteries). These deposits of calcium and plaques can partly clog and narrow the coronary arteries without producing any symptoms or warning signs. This puts a person at risk for a heart attack. This test can detect these deposits before symptoms develop. Tell a health care provider about:  Any allergies you have.  All medicines you are taking, including vitamins, herbs, eye drops, creams, and over-the-counter medicines.  Any problems you or family members have had with anesthetic medicines.  Any blood disorders you have.  Any surgeries you have had.  Any medical conditions you have.  Whether you are pregnant or may be pregnant. What are the risks? Generally, this is a safe procedure. However, problems may occur, including:  Harm to a pregnant woman and her unborn baby. This test involves the use of radiation. Radiation exposure can be dangerous to a pregnant woman and her unborn baby. If you are pregnant, you generally should not have this procedure done.  Slight increase in the risk of cancer. This is because of the radiation involved in the test. What happens before the procedure? No preparation is needed for this procedure. What happens during the procedure?  You will undress and remove any jewelry around your neck or chest.  You will put on a hospital gown.  Sticky electrodes will be placed on your chest. The  electrodes will be connected to an electrocardiogram (ECG) machine to record a tracing of the electrical activity of your heart.  A CT scanner will take pictures of your heart. During this time, you will be asked to lie still and hold your breath for 2-3 seconds while a picture of your heart is being taken. The procedure may vary among health care providers and hospitals. What happens after the procedure?  You can get dressed.  You can return to your normal activities.  It is up to you to get the results of your test. Ask your health care provider, or the department that is doing the test, when your results will be ready. Summary  A coronary calcium scan is an imaging test used to look for deposits of calcium and other fatty materials (plaques) in the inner lining of the blood vessels of the heart (coronary arteries).  Generally, this is a safe procedure. Tell your health care provider if you are pregnant or may be pregnant.  No preparation is needed for this procedure.  A CT scanner will take pictures of your heart.  You can return to your normal activities after the scan is done. This information is not intended to replace advice given to you by your health care provider. Make sure you discuss any questions you have with your health care provider. Document Released: 11/11/2007 Document Revised: 04/03/2016 Document Reviewed: 04/03/2016 Elsevier Interactive Patient Education  2017 [employer].

## 2017-12-30 ENCOUNTER — Encounter: Payer: Self-pay | Admitting: Cardiology

## 2017-12-30 DIAGNOSIS — E8881 Metabolic syndrome: Secondary | ICD-10-CM | POA: Insufficient documentation

## 2017-12-30 NOTE — Assessment & Plan Note (Signed)
Well-controlled on current dose of losartan.  This is appropriate given her new diagnosis of diabetes.

## 2017-12-30 NOTE — Assessment & Plan Note (Signed)
Just had labs checked.  Is not currently on a statin.  Plan is to work with lifestyle modifications first.  With her other cardiac risk factors (metabolic syndrome) and family history, target LDL may be closer to 70.  Low threshold for considering treatment.  In the past, she was intolerant of Crestor.  Would probably consider pravastatin 20 to 40 mg as the next option.  If still unable to control.  May require referral to lipid clinic.

## 2017-12-30 NOTE — Assessment & Plan Note (Signed)
HTN, Obesity, DM-2 & high TG levels = 4 of 5 criteria for Metabolic Syndrome.  With her Family History, she is at increased risk of CAD -- I discussed increased risk & need for close BP, glycemic & lipid management along with weight loss.

## 2017-12-30 NOTE — Assessment & Plan Note (Signed)
The patient understands the need to lose weight with diet and exercise. We have discussed specific strategies for this. Referred to Dr. Leafy Ro by PCP.

## 2017-12-30 NOTE — Assessment & Plan Note (Signed)
She has had a relatively normal cardiac evaluation in the past.  With her extensive family history and her cardiac risk factors of metabolic syndrome, we discussed screening studies to evaluate for stratification. Plan: We will check CT calcium score as a baseline.  Depending on this results may want to be more aggressive with therapy, or may want to proceed with more definitive evaluation with a coronary CT angiogram.

## 2018-01-06 ENCOUNTER — Other Ambulatory Visit: Payer: Self-pay | Admitting: Internal Medicine

## 2018-01-07 ENCOUNTER — Ambulatory Visit (INDEPENDENT_AMBULATORY_CARE_PROVIDER_SITE_OTHER)
Admission: RE | Admit: 2018-01-07 | Discharge: 2018-01-07 | Disposition: A | Discharge status: Home / Self Care | Payer: Managed Care, Other (non HMO) | Source: Ambulatory Visit | Attending: Cardiology | Admitting: Cardiology

## 2018-01-07 ENCOUNTER — Telehealth: Payer: Self-pay | Admitting: *Deleted

## 2018-01-07 DIAGNOSIS — R739 Hyperglycemia, unspecified: Secondary | ICD-10-CM

## 2018-01-07 DIAGNOSIS — E8881 Metabolic syndrome: Secondary | ICD-10-CM

## 2018-01-07 DIAGNOSIS — E669 Obesity, unspecified: Secondary | ICD-10-CM

## 2018-01-07 DIAGNOSIS — I10 Essential (primary) hypertension: Secondary | ICD-10-CM

## 2018-01-07 DIAGNOSIS — E782 Mixed hyperlipidemia: Secondary | ICD-10-CM

## 2018-01-07 DIAGNOSIS — Z8249 Family history of ischemic heart disease and other diseases of the circulatory system: Secondary | ICD-10-CM

## 2018-01-07 NOTE — Telephone Encounter (Signed)
-----   Message from Minus Breeding, MD sent at 01/07/2018  4:51 PM EDT ----- Coronary calcium score is zero.  Please call with this result.  Further management of cardiovascular risk factors per Dr. Ellyn Hack.  Call Ms. Lutes with the results and send results to Plotnikov, Evie Lacks, MD

## 2018-01-07 NOTE — Telephone Encounter (Signed)
LEFT MESSAGE TO CALL BACK  ROUTED RESULT TO PRIMARY

## 2018-01-09 NOTE — Telephone Encounter (Signed)
Follow up     Patient is returning call in reference to CT results. Please call

## 2018-01-09 NOTE — Telephone Encounter (Signed)
Left message to call back  

## 2018-01-10 NOTE — Telephone Encounter (Signed)
Called and gave results.   Patient understood, had no questions or concerns.   Will follow up with Dr.Harding.

## 2018-01-21 ENCOUNTER — Telehealth: Payer: Self-pay | Admitting: Internal Medicine

## 2018-01-21 MED ORDER — ERTUGLIFLOZIN L-PYROGLUTAMICAC 5 MG PO TABS: 5.0000 mg | ORAL_TABLET | Freq: Every day | ORAL | 11 refills | Status: DC

## 2018-01-21 NOTE — Telephone Encounter (Signed)
Notified pt w/MD response.../lmb 

## 2018-01-21 NOTE — Telephone Encounter (Signed)
I'm sorry! Pls stop Metformin. In 1 week - try Steglatro samples 5 mg 1 a day. It should help to loose wt too. Pls pick up samples. Thx

## 2018-01-21 NOTE — Telephone Encounter (Signed)
Copied from [place] [id number]. Topic: Quick Communication - Rx Refill/Question >> Jan 21, 2018 10:03 AM Margot Ables wrote: Medication: metFORMIN (GLUCOPHAGE) 500 MG tablet - pt states that the metformin is messing up her stomach and she has IBS - it is getting worse and causing pt to have accidents requiring her to take additional clothes to work with her  Preferred Pharmacy (with phone number or street name): [id number] - [place], [place] - [place] [phone] (Phone) [phone] (Fax)

## 2018-01-24 ENCOUNTER — Encounter (INDEPENDENT_AMBULATORY_CARE_PROVIDER_SITE_OTHER): Payer: Self-pay

## 2018-01-27 ENCOUNTER — Other Ambulatory Visit: Payer: Self-pay | Admitting: Internal Medicine

## 2018-02-06 ENCOUNTER — Ambulatory Visit (INDEPENDENT_AMBULATORY_CARE_PROVIDER_SITE_OTHER): Payer: Managed Care, Other (non HMO) | Admitting: Family Medicine

## 2018-02-06 ENCOUNTER — Encounter (INDEPENDENT_AMBULATORY_CARE_PROVIDER_SITE_OTHER): Payer: Self-pay | Admitting: Family Medicine

## 2018-02-06 VITALS — BP 111/70 | HR 50 | Temp 97.5°F | Ht 61.0 in | Wt 171.0 lb

## 2018-02-06 DIAGNOSIS — E559 Vitamin D deficiency, unspecified: Secondary | ICD-10-CM | POA: Diagnosis not present

## 2018-02-06 DIAGNOSIS — J45990 Exercise induced bronchospasm: Secondary | ICD-10-CM | POA: Insufficient documentation

## 2018-02-06 DIAGNOSIS — E669 Obesity, unspecified: Secondary | ICD-10-CM

## 2018-02-06 DIAGNOSIS — Z9189 Other specified personal risk factors, not elsewhere classified: Secondary | ICD-10-CM | POA: Diagnosis not present

## 2018-02-06 DIAGNOSIS — E119 Type 2 diabetes mellitus without complications: Secondary | ICD-10-CM | POA: Insufficient documentation

## 2018-02-06 DIAGNOSIS — Z6832 Body mass index (BMI) 32.0-32.9, adult: Secondary | ICD-10-CM

## 2018-02-06 DIAGNOSIS — R5383 Other fatigue: Secondary | ICD-10-CM | POA: Diagnosis not present

## 2018-02-06 DIAGNOSIS — G9332 Myalgic encephalomyelitis/chronic fatigue syndrome: Secondary | ICD-10-CM | POA: Insufficient documentation

## 2018-02-06 DIAGNOSIS — Z1331 Encounter for screening for depression: Secondary | ICD-10-CM | POA: Diagnosis not present

## 2018-02-06 DIAGNOSIS — Z0289 Encounter for other administrative examinations: Secondary | ICD-10-CM

## 2018-02-06 DIAGNOSIS — E538 Deficiency of other specified B group vitamins: Secondary | ICD-10-CM

## 2018-02-06 NOTE — Progress Notes (Signed)
Office: [phone]  /  Fax: [phone]   Dear Dr. Alain Marion and Dr. Helane Rima,   Thank you for referring Amy Lang to our clinic. The following note includes my evaluation and treatment recommendations.  HPI:   Chief Complaint: OBESITY    Amy Lang has been referred by Evie Lacks. Plotnikov, MD and Dian Queen, MD for consultation regarding her obesity and obesity related comorbidities.    Amy Lang (MR# [id number]) is a 52 y.o. female who presents on 02/06/2018 for obesity evaluation and treatment. Current BMI is Body mass index is 32.31 kg/m.[name] Lasasha has been struggling with her weight for many years and has been unsuccessful in either losing weight, maintaining weight loss, or reaching her healthy weight goal.     Amy Lang attended our information session and states she is currently in the action stage of change and ready to dedicate time achieving and maintaining a healthier weight. Amy Lang is interested in becoming our patient and working on intensive lifestyle modifications including (but not limited to) diet, exercise and weight loss.    Amy Lang states her family eats meals together she thinks her family will eat healthier with  her she started gaining weight in the last 10 yrs her heaviest weight ever was 185 lbs. she has significant food cravings issues  she wakes up frequently in the middle of the night to eat she skips meals frequently she is frequently drinking liquids with calories she has problems with excessive hunger    Fatigue Amy Lang feels her energy is lower than it should be. This has worsened with weight gain and has not worsened recently. Amy Lang admits to daytime somnolence and admits to waking up still tired. Patient is at risk for obstructive sleep apnea. Patent has a history of symptoms of daytime fatigue and morning fatigue. Patient generally gets 6 or 7 hours of sleep per night, and states they generally have restful sleep. Snoring is present. Apneic episodes  are not present. Epworth Sleepiness Score is 5  Exercise Induced Asthma Nija has a diagnosis of exercise induced asthma that also seems worse in th Spring and Fall. She uses albuterol with exercise and she is not on any maintenance medications.  Diabetes II Amy Lang has a diagnosis of diabetes type II. She is currently on Steglatro (SGLT-2). Amy Lang had GI upset with metformin. Last A1c was at 6.8 and she has a strong family history of diabetes and coronary artery disease. She is attempting to work on intensive lifestyle modifications including diet, exercise, and weight loss to help control her blood glucose levels.  At risk for cardiovascular disease Amy Lang is at a higher than average risk for cardiovascular disease due to obesity and diabetes. She currently denies any chest pain.  Vitamin D deficiency Amy Lang has a diagnosis of vitamin D deficiency. She is currently taking prescription vit D and there are no recent labs in Epic. Amy Lang denies nausea, vomiting or muscle weakness.  Vitamin B12 deficiency Amy Lang has a diagnosis of B12 insufficiency and notes fatigue. This is not a new diagnosis. Amy Lang is not a vegetarian and does not have a previous diagnosis of pernicious anemia. She does not have a history of weight loss surgery. Amy Lang is currently on OTC B12 supplement and there are no recent labs in Epic.  Depression Screen Amy Lang's Food and Mood (modified PHQ-9) score was  Depression screen PHQ 2/9 02/06/2018  Decreased Interest 0  Down, Depressed, Hopeless 0  PHQ - 2 Score 0  Altered sleeping 0  Tired,  decreased energy 1  Change in appetite 1  Feeling bad or failure about yourself  0  Trouble concentrating 1  Moving slowly or fidgety/restless 0  Suicidal thoughts 0  PHQ-9 Score 3  Difficult doing work/chores Not difficult at all    ALLERGIES: Allergies  Allergen Reactions  . Bee Pollen Anaphylaxis  . Butorphanol   . Butorphanol Tartrate Other (See Comments)    REACTION: hallucinations (  stadol)  . Codeine   . Codeine Sulfate Hives and Nausea And Vomiting  . Crestor [Rosuvastatin] Other (See Comments)    Body aches  . Darvocet [Propoxyphene N-Acetaminophen] Hives and Nausea And Vomiting  . Estrogens   . Levofloxacin Other (See Comments)    abdominal pain  . Metformin And Related     diarrhea  . Penicillamine   . Penicillins Other (See Comments)    Seizure   . Sulfa Antibiotics Hives and Nausea And Vomiting  . Latex Rash    Blisters and redness with bandaids-also sensitivity to latex gloves    MEDICATIONS: Current Outpatient Medications on File Prior to Visit  Medication Sig Dispense Refill  . aspirin EC 81 MG tablet Take 81 mg by mouth every evening.     . cetirizine (ZYRTEC) 10 MG tablet Take 10 mg by mouth daily.     . cyanocobalamin (,VITAMIN B-12,) 1000 MCG/ML injection 1 ml IM or SQ every 2 weeks (lifetime) 10 mL 5  . Ertugliflozin L-PyroglutamicAc (STEGLATRO) 5 MG TABS Take 5 mg by mouth daily. 30 tablet 11  . FLUoxetine (PROZAC) 20 MG tablet Take 1 tablet (20 mg total) by mouth daily. 30 tablet 5  . losartan (COZAAR) 100 MG tablet Take 1 tablet (100 mg total) by mouth daily. 90 tablet 3  . Multiple Vitamin (MULTIVITAMIN WITH MINERALS) TABS Take 1 tablet by mouth daily.    . Multiple Vitamins-Minerals (PRESERVISION AREDS 2) CAPS Take 1 capsule by mouth at bedtime.    . Omega-3 Fatty Acids (FISH OIL) 1000 MG CAPS Take 1,000 mg by mouth daily.    . pantoprazole (PROTONIX) 20 MG tablet Take 1 tablet (20 mg total) by mouth daily before breakfast. 90 tablet 3  . Probiotic Product (ALIGN) 4 MG CAPS Take 1 capsule by mouth daily. 30 capsule 1  . topiramate (TOPAMAX) 200 MG tablet Take 1 tablet (200 mg total) by mouth daily. Must keep OV in March to get refills 90 tablet 3  . vitamin C (ASCORBIC ACID) 500 MG tablet Take 500 mg by mouth daily.     . Vitamin D, Ergocalciferol, (DRISDOL) 50000 units CAPS capsule TAKE ONE CAPSULE BY MOUTH ONE TIME PER WEEK 4 capsule 3    . vitamin E 1000 UNIT capsule Take 1,000 Units by mouth daily.     No current facility-administered medications on file prior to visit.     PAST MEDICAL HISTORY: Past Medical History:  Diagnosis Date  . Allergy   . Anxiety   . Asthma   . Depression   . Diabetes ([place])   . Dyslipidemia   . Hypertension   . IBS (irritable bowel syndrome)   . Lactose intolerance   . Migraines   . Obesity   . PONV (postoperative nausea and vomiting)   . Shortness of breath   . Vaginal cancer ([place])   . Vitamin B 12 deficiency   . Vitamin D deficiency     PAST SURGICAL HISTORY: Past Surgical History:  Procedure Laterality Date  . ABDOMINAL HYSTERECTOMY  still has bilateral ovaries  . BREAST LUMPECTOMY WITH RADIOACTIVE SEED LOCALIZATION Right 08/28/2017   Procedure: BREAST LUMPECTOMY WITH RADIOACTIVE SEED LOCALIZATION;  Surgeon: Erroll Luna, MD;  Location: [facility];  Service: General;  Laterality: Right;  . CHOLECYSTECTOMY  04-04-11   single site  . COLONOSCOPY W/ BIOPSIES  01/24/2013  . ESOPHAGOGASTRODUODENOSCOPY    . KNEE SURGERY    . LAPAROTOMY  05/12/2012   Procedure: LAPAROTOMY;  Surgeon: Cyril Mourning, MD;  Location: [facility];  Service: Gynecology;;  laparotomy [name] salpingectomy-oopharectomy  . NM MYOCAR PERF WALL MOTION  09/06/2009   normal  . TRANSTHORACIC ECHOCARDIOGRAM  March 2014   Normal regional wall motion. EF 55%. Mild MR    SOCIAL HISTORY: Social History   Tobacco Use  . Smoking status: Never Smoker  . Smokeless tobacco: Never Used  . Tobacco comment: Regular Exercise - No  Substance Use Topics  . Alcohol use: No  . Drug use: No    FAMILY HISTORY: Family History  Problem Relation Age of Onset  . Heart disease Mother   . COPD Mother   . Heart attack Mother 71  . Diabetes Mother   . Hypertension Mother   . Hyperlipidemia Mother   . Kidney disease Mother   . Thyroid disease Mother   . Lung cancer Father   . Hypertension Father    . Hyperlipidemia Father   . Heart disease Maternal Grandmother   . Breast cancer Maternal Grandmother 88  . Heart disease Maternal Grandfather   . Heart disease Paternal Grandmother   . Heart disease Paternal Grandfather   . Colon cancer Neg Hx   . Esophageal cancer Neg Hx   . Rectal cancer Neg Hx   . Stomach cancer Neg Hx     ROS: Review of Systems  Constitutional: Positive for malaise/fatigue.       Breasts Lumps  HENT:       Hay Fever  Eyes:       Wear Glasses or Contacts  Respiratory: Positive for shortness of breath (with activity).   Cardiovascular: Negative for chest pain.  Gastrointestinal: Positive for diarrhea. Negative for nausea and vomiting.  Musculoskeletal:       Negative for muscle weakness  Neurological: Positive for headaches.    PHYSICAL EXAM: Blood pressure 111/70, pulse (!) 50, temperature (!) 97.5 F (36.4 C), temperature source Oral, height 5\' 1"  (1.549 m), weight 171 lb (77.6 kg), SpO2 99 %. Body mass index is 32.31 kg/m. Physical Exam  Constitutional: She is oriented to person, place, and time. She appears well-developed and well-nourished.  HENT:  Head: Normocephalic and atraumatic.  Nose: Nose normal.  Eyes: EOM are normal. No scleral icterus.  Neck: Normal range of motion. Neck supple. No thyromegaly present.  Cardiovascular: Normal rate and regular rhythm.  Pulmonary/Chest: Effort normal. No respiratory distress.  Abdominal: Soft. There is no tenderness.  + obesity  Musculoskeletal: Normal range of motion.  Range of Motion normal in all 4 extremities  Neurological: She is alert and oriented to person, place, and time. Coordination normal.  Skin: Skin is warm and dry.  Psychiatric: She has a normal mood and affect. Her behavior is normal.  Vitals reviewed.   RECENT LABS AND TESTS: BMET    Component Value Date/Time   NA 140 11/19/2017 1051   NA 142 09/12/2016 0832   K 4.4 11/19/2017 1051   CL 109 11/19/2017 1051   CO2 23  11/19/2017 1051   GLUCOSE 126 (H)  11/19/2017 1051   BUN 17 11/19/2017 1051   BUN 19 09/12/2016 0832   CREATININE 0.74 11/19/2017 1051   CALCIUM 9.2 11/19/2017 1051   GFRNONAA 92 09/12/2016 0832   GFRAA 106 09/12/2016 0832   Lab Results  Component Value Date   HGBA1C 6.8 (H) 11/19/2017   No results found for: INSULIN CBC    Component Value Date/Time   WBC 5.9 10/01/2017 0938   RBC 4.77 10/01/2017 0938   HGB 14.5 10/01/2017 0938   HGB WILL FOLLOW 09/12/2016 0832   HCT 42.9 10/01/2017 0938   HCT WILL FOLLOW 09/12/2016 0832   PLT 187.0 10/01/2017 0938   PLT WILL FOLLOW 09/12/2016 0832   MCV 90.1 10/01/2017 0938   MCV WILL FOLLOW 09/12/2016 0832   MCH WILL FOLLOW 09/12/2016 0832   MCH 30.5 05/13/2012 0500   MCHC 33.8 10/01/2017 0938   RDW 12.7 10/01/2017 0938   RDW WILL FOLLOW 09/12/2016 0832   LYMPHSABS 2.1 10/01/2017 0938   LYMPHSABS WILL FOLLOW 09/12/2016 0832   MONOABS 0.5 10/01/2017 0938   EOSABS 0.0 10/01/2017 0938   EOSABS WILL FOLLOW 09/12/2016 0832   BASOSABS 0.0 10/01/2017 0938   BASOSABS WILL FOLLOW 09/12/2016 0832   Iron/TIBC/Ferritin/ %Sat No results found for: IRON, TIBC, FERRITIN, IRONPCTSAT Lipid Panel     Component Value Date/Time   CHOL 185 11/19/2017 1051   CHOL 175 08/14/2016 1135   TRIG 194.0 (H) 11/19/2017 1051   HDL 45.40 11/19/2017 1051   HDL 50 08/14/2016 1135   CHOLHDL 4 11/19/2017 1051   VLDL 38.8 11/19/2017 1051   LDLCALC 101 (H) 11/19/2017 1051   LDLCALC 64 08/14/2016 1135   LDLDIRECT 96.0 10/01/2017 0938   Hepatic Function Panel     Component Value Date/Time   PROT 7.4 10/01/2017 0938   PROT 7.0 08/14/2016 1135   ALBUMIN 4.3 10/01/2017 0938   ALBUMIN 4.5 08/14/2016 1135   AST 25 10/01/2017 0938   ALT 33 10/01/2017 0938   ALKPHOS 90 10/01/2017 0938   BILITOT 0.5 10/01/2017 0938   BILITOT 0.3 08/14/2016 1135   BILIDIR 0.1 10/01/2017 0938   BILIDIR 0.10 08/14/2016 1135   IBILI NOT CALCULATED 04/03/2011 0558      Component  Value Date/Time   TSH 2.24 10/01/2017 0938   TSH 1.90 09/11/2016 1406   TSH 1.02 04/15/2012 1207   ECG shows NSR with a rate of 60 BPM  INDIRECT CALORIMETER done today shows a VO2 of 229 and a REE of 1594.  Her calculated basal metabolic rate is 0947 thus her basal metabolic rate is better than expected.    ASSESSMENT AND PLAN: Other fatigue - Plan: EKG 12-Lead, CBC With Differential, Folate, Lipid Panel With LDL/HDL Ratio, T3, T4, free, TSH  Exercise-induced asthma  Type 2 diabetes mellitus without complication, without long-term current use of insulin (HCC) - Plan: Comprehensive metabolic panel, Hemoglobin A1c, Insulin, random  Vitamin D deficiency - Plan: VITAMIN D 25 Hydroxy (Vit-D Deficiency, Fractures)  B12 nutritional deficiency - Plan: Vitamin B12  Depression screening  At risk for heart disease  Class 1 obesity with serious comorbidity and body mass index (BMI) of 32.0 to 32.9 in adult, unspecified obesity type  PLAN: Fatigue Kynadie was informed that her fatigue may be related to obesity, depression or many other causes. Labs will be ordered, and in the meanwhile Malayiah has agreed to work on diet, exercise and weight loss to help with fatigue. Proper sleep hygiene was discussed including the need for 7-8 hours  of quality sleep each night. A sleep study was not ordered based on symptoms and Epworth score.  Exercise Induced Asthma We will order indirect calorimetry today. We discussed weight loss to help prevent asthma exacerbations. Danalee will follow up with our clinic in 2 weeks.  Diabetes II Shannara has been given extensive diabetes education by myself today including ideal fasting and post-prandial blood glucose readings, individual ideal Hgb A1c goals and hypoglycemia prevention. We discussed the importance of good blood sugar control to decrease the likelihood of diabetic complications such as nephropathy, neuropathy, limb loss, blindness, coronary artery disease, and death.  We discussed the importance of intensive lifestyle modification including diet, exercise and weight loss as the first line treatment for diabetes. We will check labs and follow. Elveta agrees to start diet and follow up at the agreed upon time.  Cardiovascular risk counseling Gao was given extended (15 minutes) coronary artery disease prevention counseling today. She is 52 y.o. female and has risk factors for heart disease including obesity and diabetes. We discussed intensive lifestyle modifications today with an emphasis on specific weight loss instructions and strategies. Pt was also informed of the importance of increasing exercise and decreasing saturated fats to help prevent heart disease.  Vitamin D Deficiency Berna was informed that low vitamin D levels contributes to fatigue and are associated with obesity, breast, and colon cancer. She  Will  continue to take prescription Vit D @50 ,000 IU every week. We will check labs and will follow up for routine testing of vitamin D, at least 2-3 times per year. She was informed of the risk of over-replacement of vitamin D and agrees to not increase her dose unless she discusses this with [name] first. Krystyne will follow up with our clinic at the agreed upon time.  Vitamin B12 deficiency Nupur will work on increasing B12 rich foods in her diet. B12 supplementation was not prescribed today. We will check labs and follow. Jasey agrees to follow up with our clinic in 2 weeks.  Depression Screen Simona had a negative depression screening. Depression is commonly associated with obesity and often results in emotional eating behaviors. We will monitor this closely and work on CBT to help improve the non-hunger eating patterns.  Obesity Kimiya is currently in the action stage of change and her goal is to continue with weight loss efforts. I recommend Yuval begin the structured treatment plan as follows:  She has agreed to follow the Category 2 plan Sylva has been instructed  to eventually work up to a goal of 150 minutes of combined cardio and strengthening exercise per week for weight loss and overall health benefits. We discussed the following Behavioral Modification Strategies today: increasing lean protein intake, decreasing simple carbohydrates  and work on meal planning and easy cooking plans   She was informed of the importance of frequent follow up visits to maximize her success with intensive lifestyle modifications for her multiple health conditions. She was informed we would discuss her lab results at her next visit unless there is a critical issue that needs to be addressed sooner. Raynell agreed to keep her next visit at the agreed upon time to discuss these results.    OBESITY BEHAVIORAL INTERVENTION VISIT  Today's visit was # 1   Starting weight: 171 lbs Starting date: 02/06/18 Today's weight : 171 lbs Today's date: 02/06/2018 Total lbs lost to date: 0   ASK: We discussed the diagnosis of obesity with [name] today and Lalitha agreed to give  [name] permission to discuss obesity behavioral modification therapy today.  ASSESS: Elleanor has the diagnosis of obesity and her BMI today is 32.33 Saidy is in the action stage of change   ADVISE: Talaya was educated on the multiple health risks of obesity as well as the benefit of weight loss to improve her health. She was advised of the need for long term treatment and the importance of lifestyle modifications to improve her current health and to decrease her risk of future health problems.  AGREE: Multiple dietary modification options and treatment options were discussed and  Erum agreed to follow the recommendations documented in the above note.  ARRANGE: Shatyra was educated on the importance of frequent visits to treat obesity as outlined per CMS and USPSTF guidelines and agreed to schedule her next follow up appointment today.  I, Doreene Nest, am acting as transcriptionist for Dennard Nip, MD  I have  reviewed the above documentation for accuracy and completeness, and I agree with the above. -Dennard Nip, MD

## 2018-02-07 LAB — LIPID PANEL WITH LDL/HDL RATIO
Cholesterol, Total: 168 mg/dL (ref 100–199)
HDL: 48 mg/dL (ref >39)
LDL Calculated: 91 mg/dL (ref 0–99)
LDL/HDL RATIO: 1.9 ratio (ref 0.0–3.2)
TRIGLYCERIDES: 144 mg/dL (ref 0–149)
VLDL CHOLESTEROL CAL: 29 mg/dL (ref 5–40)

## 2018-02-07 LAB — CBC WITH DIFFERENTIAL
BASOS: 0 %
Basophils Absolute: 0 10*3/uL (ref 0.0–0.2)
EOS (ABSOLUTE): 0 10*3/uL (ref 0.0–0.4)
EOS: 0 %
HEMATOCRIT: 42.8 % (ref 34.0–46.6)
HEMOGLOBIN: 13.8 g/dL (ref 11.1–15.9)
IMMATURE GRANS (ABS): 0 10*3/uL (ref 0.0–0.1)
Immature Granulocytes: 0 %
LYMPHS: 35 %
Lymphocytes Absolute: 2 10*3/uL (ref 0.7–3.1)
MCH: 30.1 pg (ref 26.6–33.0)
MCHC: 32.2 g/dL (ref 31.5–35.7)
MCV: 93 fL (ref 79–97)
MONOCYTES: 6 %
Monocytes Absolute: 0.3 10*3/uL (ref 0.1–0.9)
Neutrophils Absolute: 3.4 10*3/uL (ref 1.4–7.0)
Neutrophils: 59 %
RBC: 4.58 x10E6/uL (ref 3.77–5.28)
RDW: 13.8 % (ref 12.3–15.4)
WBC: 5.7 10*3/uL (ref 3.4–10.8)

## 2018-02-07 LAB — COMPREHENSIVE METABOLIC PANEL
A/G RATIO: 1.8 (ref 1.2–2.2)
ALBUMIN: 4.4 g/dL (ref 3.5–5.5)
ALT: 30 IU/L (ref 0–32)
AST: 23 IU/L (ref 0–40)
Alkaline Phosphatase: 82 IU/L (ref 39–117)
BILIRUBIN TOTAL: 0.3 mg/dL (ref 0.0–1.2)
BUN / CREAT RATIO: 26 — AB (ref 9–23)
BUN: 19 mg/dL (ref 6–24)
CHLORIDE: 106 mmol/L (ref 96–106)
CO2: 19 mmol/L — ABNORMAL LOW (ref 20–29)
Calcium: 8.9 mg/dL (ref 8.7–10.2)
Creatinine, Ser: 0.72 mg/dL (ref 0.57–1.00)
GFR, EST AFRICAN AMERICAN: 110 mL/min/{1.73_m2} (ref >59)
GFR, EST NON AFRICAN AMERICAN: 95 mL/min/{1.73_m2} (ref >59)
Globulin, Total: 2.4 g/dL (ref 1.5–4.5)
Glucose: 84 mg/dL (ref 65–99)
POTASSIUM: 4.4 mmol/L (ref 3.5–5.2)
Sodium: 143 mmol/L (ref 134–144)
TOTAL PROTEIN: 6.8 g/dL (ref 6.0–8.5)

## 2018-02-07 LAB — T4, FREE: Free T4: 1.01 ng/dL (ref 0.82–1.77)

## 2018-02-07 LAB — HEMOGLOBIN A1C
Est. average glucose Bld gHb Est-mCnc: 126 mg/dL
Hgb A1c MFr Bld: 6 % — ABNORMAL HIGH (ref 4.8–5.6)

## 2018-02-07 LAB — MICROALBUMIN / CREATININE URINE RATIO
CREATININE, UR: 67.4 mg/dL
MICROALBUM., U, RANDOM: 3 ug/mL
Microalb/Creat Ratio: 4.5 mg/g creat (ref 0.0–30.0)

## 2018-02-07 LAB — INSULIN, RANDOM: INSULIN: 18.3 u[IU]/mL (ref 2.6–24.9)

## 2018-02-07 LAB — VITAMIN D 25 HYDROXY (VIT D DEFICIENCY, FRACTURES): VIT D 25 HYDROXY: 37.6 ng/mL (ref 30.0–100.0)

## 2018-02-07 LAB — T3: T3 TOTAL: 128 ng/dL (ref 71–180)

## 2018-02-07 LAB — FOLATE: Folate: 16 ng/mL (ref >3.0)

## 2018-02-07 LAB — TSH: TSH: 2.03 u[IU]/mL (ref 0.450–4.500)

## 2018-02-07 LAB — VITAMIN B12: VITAMIN B 12: 469 pg/mL (ref 232–1245)

## 2018-02-21 ENCOUNTER — Ambulatory Visit (INDEPENDENT_AMBULATORY_CARE_PROVIDER_SITE_OTHER): Payer: Managed Care, Other (non HMO) | Admitting: Family Medicine

## 2018-02-21 VITALS — BP 116/72 | HR 62 | Temp 98.0°F | Ht 61.0 in | Wt 166.0 lb

## 2018-02-21 DIAGNOSIS — Z9189 Other specified personal risk factors, not elsewhere classified: Secondary | ICD-10-CM | POA: Diagnosis not present

## 2018-02-21 DIAGNOSIS — Z6831 Body mass index (BMI) 31.0-31.9, adult: Secondary | ICD-10-CM

## 2018-02-21 DIAGNOSIS — E669 Obesity, unspecified: Secondary | ICD-10-CM

## 2018-02-21 DIAGNOSIS — E559 Vitamin D deficiency, unspecified: Secondary | ICD-10-CM | POA: Diagnosis not present

## 2018-02-21 DIAGNOSIS — E119 Type 2 diabetes mellitus without complications: Secondary | ICD-10-CM | POA: Diagnosis not present

## 2018-02-21 MED ORDER — VITAMIN D (ERGOCALCIFEROL) 1.25 MG (50000 UNIT) PO CAPS: 50000.0000 [IU] | ORAL_CAPSULE | ORAL | 0 refills | Status: DC

## 2018-02-21 MED ORDER — METFORMIN HCL 500 MG PO TABS: 500.0000 mg | ORAL_TABLET | Freq: Every day | ORAL | 0 refills | Status: DC

## 2018-02-25 NOTE — Progress Notes (Signed)
Office: [phone]  /  Fax: [phone]   HPI:   Chief Complaint: OBESITY Amy Lang is here to discuss her progress with her obesity treatment plan. She is on the  follow the Category 2 plan and is following her eating plan approximately 90 % of the time. She states she is exercising 0 minutes 0 times per week. [name] did very well with weight loss on her Cat 2 meal plan. Her hunger was mostly controlled but she did wake up at night feeling hungry some days. Her weight is 166 lb (75.3 kg) today and has had a weight loss of 5 pounds over a period of 2 weeks since her last visit. She has lost 5 lbs since starting treatment with [name].  Vitamin D deficiency [name] has a diagnosis of vitamin D deficiency. She is currently taking vit D and denies nausea, vomiting or muscle weakness.  Diabetes II [name] has a diagnosis of diabetes type II. [name] states patient does not check sugars and denies any hypoglycemic episodes. Last A1c was Hemoglobin A1C Latest Ref Rng & Units 02/06/2018 11/19/2017  HGBA1C 4.8 - 5.6 % 6.0(H) 6.8(H)  Some recent data might be hidden    She has been working on intensive lifestyle modifications including diet, exercise, and weight loss to help control her blood glucose levels.  At risk for cardiovascular disease Shade is at a higher than average risk for cardiovascular disease due to obesity. She currently denies any chest pain.   ALLERGIES: Allergies  Allergen Reactions  . Bee Pollen Anaphylaxis  . Butorphanol   . Butorphanol Tartrate Other (See Comments)    REACTION: hallucinations ( stadol)  . Codeine   . Codeine Sulfate Hives and Nausea And Vomiting  . Crestor [Rosuvastatin] Other (See Comments)    Body aches  . Darvocet [Propoxyphene N-Acetaminophen] Hives and Nausea And Vomiting  . Estrogens   . Levofloxacin Other (See Comments)    abdominal pain  . Metformin And Related     diarrhea  . Penicillamine   . Penicillins Other (See Comments)    Seizure   . Sulfa  Antibiotics Hives and Nausea And Vomiting  . Latex Rash    Blisters and redness with bandaids-also sensitivity to latex gloves    MEDICATIONS: Current Outpatient Medications on File Prior to Visit  Medication Sig Dispense Refill  . aspirin EC 81 MG tablet Take 81 mg by mouth every evening.     . cetirizine (ZYRTEC) 10 MG tablet Take 10 mg by mouth daily.     . cyanocobalamin (,VITAMIN B-12,) 1000 MCG/ML injection 1 ml IM or SQ every 2 weeks (lifetime) 10 mL 5  . Ertugliflozin L-PyroglutamicAc (STEGLATRO) 5 MG TABS Take 5 mg by mouth daily. 30 tablet 11  . FLUoxetine (PROZAC) 20 MG tablet Take 1 tablet (20 mg total) by mouth daily. 30 tablet 5  . losartan (COZAAR) 100 MG tablet Take 1 tablet (100 mg total) by mouth daily. 90 tablet 3  . Multiple Vitamin (MULTIVITAMIN WITH MINERALS) TABS Take 1 tablet by mouth daily.    . Multiple Vitamins-Minerals (PRESERVISION AREDS 2) CAPS Take 1 capsule by mouth at bedtime.    . Omega-3 Fatty Acids (FISH OIL) 1000 MG CAPS Take 1,000 mg by mouth daily.    . pantoprazole (PROTONIX) 20 MG tablet Take 1 tablet (20 mg total) by mouth daily before breakfast. 90 tablet 3  . Probiotic Product (ALIGN) 4 MG CAPS Take 1 capsule by mouth daily. 30 capsule 1  . topiramate (  TOPAMAX) 200 MG tablet Take 1 tablet (200 mg total) by mouth daily. Must keep OV in March to get refills 90 tablet 3  . vitamin C (ASCORBIC ACID) 500 MG tablet Take 500 mg by mouth daily.     . vitamin E 1000 UNIT capsule Take 1,000 Units by mouth daily.     No current facility-administered medications on file prior to visit.     PAST MEDICAL HISTORY: Past Medical History:  Diagnosis Date  . Allergy   . Anxiety   . Asthma   . Depression   . Diabetes ([place])   . Dyslipidemia   . Hypertension   . IBS (irritable bowel syndrome)   . Lactose intolerance   . Migraines   . Obesity   . PONV (postoperative nausea and vomiting)   . Shortness of breath   . 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 Vitamin B 12  deficiency   . Vitamin D deficiency     PAST SURGICAL HISTORY: Past Surgical History:  Procedure Laterality Date  . ABDOMINAL HYSTERECTOMY     still has bilateral ovaries  . BREAST LUMPECTOMY WITH RADIOACTIVE SEED LOCALIZATION Right 08/28/2017   Procedure: BREAST LUMPECTOMY WITH RADIOACTIVE SEED LOCALIZATION;  Surgeon: Erroll Luna, MD;  Location: [facility];  Service: General;  Laterality: Right;  . CHOLECYSTECTOMY  04-04-11   single site  . COLONOSCOPY W/ BIOPSIES  01/24/2013  . ESOPHAGOGASTRODUODENOSCOPY    . KNEE SURGERY    . LAPAROTOMY  05/12/2012   Procedure: LAPAROTOMY;  Surgeon: Cyril Mourning, MD;  Location: [facility];  Service: Gynecology;;  laparotomy [name] salpingectomy-oopharectomy  . NM MYOCAR PERF WALL MOTION  09/06/2009   normal  . TRANSTHORACIC ECHOCARDIOGRAM  March 2014   Normal regional wall motion. EF 55%. Mild MR    SOCIAL HISTORY: Social History   Tobacco Use  . Smoking status: Never Smoker  . Smokeless tobacco: Never Used  . Tobacco comment: Regular Exercise - No  Substance Use Topics  . Alcohol use: No  . Drug use: No    FAMILY HISTORY: Family History  Problem Relation Age of Onset  . Heart disease Mother   . COPD Mother   . Heart attack Mother 63  . Diabetes Mother   . Hypertension Mother   . Hyperlipidemia Mother   . Kidney disease Mother   . Thyroid disease Mother   . Lung cancer Father   . Hypertension Father   . Hyperlipidemia Father   . Heart disease Maternal Grandmother   . Breast cancer Maternal Grandmother 67  . Heart disease Maternal Grandfather   . Heart disease Paternal Grandmother   . Heart disease Paternal Grandfather   . Colon cancer Neg Hx   . Esophageal cancer Neg Hx   . Rectal cancer Neg Hx   . Stomach cancer Neg Hx     ROS: Review of Systems  Constitutional: Positive for weight loss.  All other systems reviewed and are negative.   PHYSICAL EXAM: Blood pressure 116/72, pulse 62, temperature  98 F (36.7 C), temperature source Oral, height 5\' 1"  (1.549 m), weight 166 lb (75.3 kg), SpO2 100 %. Body mass index is 31.37 kg/m. Physical Exam  Constitutional: She is oriented to person, place, and time. She appears well-developed and well-nourished.  HENT:  Head: Normocephalic.  Eyes: EOM are normal.  Neck: Normal range of motion.  Pulmonary/Chest: Effort normal.  Musculoskeletal: Normal range of motion.  Neurological: She is alert and oriented to person, place, and  time.  Skin: Skin is warm and dry.  Psychiatric: She has a normal mood and affect. Her behavior is normal.  Vitals reviewed.   RECENT LABS AND TESTS: BMET    Component Value Date/Time   NA 143 02/06/2018 1246   K 4.4 02/06/2018 1246   CL 106 02/06/2018 1246   CO2 19 (L) 02/06/2018 1246   GLUCOSE 84 02/06/2018 1246   GLUCOSE 126 (H) 11/19/2017 1051   BUN 19 02/06/2018 1246   CREATININE 0.72 02/06/2018 1246   CALCIUM 8.9 02/06/2018 1246   GFRNONAA 95 02/06/2018 1246   GFRAA 110 02/06/2018 1246   Lab Results  Component Value Date   HGBA1C 6.0 (H) 02/06/2018   HGBA1C 6.8 (H) 11/19/2017   Lab Results  Component Value Date   INSULIN 18.3 02/06/2018   CBC    Component Value Date/Time   WBC 5.7 02/06/2018 1246   WBC 5.9 10/01/2017 0938   RBC 4.58 02/06/2018 1246   RBC 4.77 10/01/2017 0938   HGB 13.8 02/06/2018 1246   HCT 42.8 02/06/2018 1246   PLT 187.0 10/01/2017 0938   PLT WILL FOLLOW 09/12/2016 0832   MCV 93 02/06/2018 1246   MCH 30.1 02/06/2018 1246   MCH 30.5 05/13/2012 0500   MCHC 32.2 02/06/2018 1246   MCHC 33.8 10/01/2017 0938   RDW 13.8 02/06/2018 1246   LYMPHSABS 2.0 02/06/2018 1246   MONOABS 0.5 10/01/2017 0938   EOSABS 0.0 02/06/2018 1246   BASOSABS 0.0 02/06/2018 1246   Iron/TIBC/Ferritin/ %Sat No results found for: IRON, TIBC, FERRITIN, IRONPCTSAT Lipid Panel     Component Value Date/Time   CHOL 168 02/06/2018 1246   TRIG 144 02/06/2018 1246   HDL 48 02/06/2018 1246    CHOLHDL 4 11/19/2017 1051   VLDL 38.8 11/19/2017 1051   LDLCALC 91 02/06/2018 1246   LDLDIRECT 96.0 10/01/2017 0938   Hepatic Function Panel     Component Value Date/Time   PROT 6.8 02/06/2018 1246   ALBUMIN 4.4 02/06/2018 1246   AST 23 02/06/2018 1246   ALT 30 02/06/2018 1246   ALKPHOS 82 02/06/2018 1246   BILITOT 0.3 02/06/2018 1246   BILIDIR 0.1 10/01/2017 0938   BILIDIR 0.10 08/14/2016 1135   IBILI NOT CALCULATED 04/03/2011 0558      Component Value Date/Time   TSH 2.030 02/06/2018 1246   TSH 2.24 10/01/2017 0938   TSH 1.90 09/11/2016 1406    ASSESSMENT AND PLAN: Vitamin D deficiency - Plan: Vitamin D, Ergocalciferol, (DRISDOL) 50000 units CAPS capsule  Type 2 diabetes mellitus without complication, without long-term current use of insulin (HCC) - Plan: metFORMIN (GLUCOPHAGE) 500 MG tablet  At risk for heart disease  Class 1 obesity with serious comorbidity and body mass index (BMI) of 31.0 to 31.9 in adult, unspecified obesity type  PLAN: Vitamin D Deficiency Evony was informed that low vitamin D levels contributes to fatigue and are associated with obesity, breast, and colon cancer. She agrees to continue to take prescription Vit D @50 ,000 IU every week and will follow up for routine testing of vitamin D, at least 2-3 times per year. She was informed of the risk of over-replacement of vitamin D and agrees to not increase her dose unless she discusses this with [name] first. Will increase her Vit D to 50k every 3 days as a prescription was written today for a 30 day supply qty 10 and no refills.   Diabetes II Chrystel has been given extensive diabetes education by myself today including  ideal fasting and post-prandial blood glucose readings, individual ideal HgA1c goals  and hypoglycemia prevention. We discussed the importance of good blood sugar control to decrease the likelihood of diabetic complications such as nephropathy, neuropathy, limb loss, blindness, coronary artery  disease, and death. We discussed the importance of intensive lifestyle modification including diet, exercise and weight loss as the first line treatment for diabetes. Abigal agrees to continue her diabetes medications and will follow up at the agreed upon time. Metformin started toady at 500 mg daily (pt to take 1/2 pill at noon) qty 30 with no refills.   Cardiovascular risk counselling Sherlene was given extended (30 minutes) coronary artery disease prevention counseling today. She is 52 y.o. female and has risk factors for heart disease including obesity. We discussed intensive lifestyle modifications today with an emphasis on specific weight loss instructions and strategies. Pt was also informed of the importance of increasing exercise and decreasing saturated fats to help prevent heart disease.  Obesity Jem is currently in the action stage of change. As such, her goal is to continue with weight loss efforts She has agreed to follow the Category 2 plan, pt to substitute 2 oz of lean meat for 1/2 cup cottage cheese.  Shynia has been instructed to work up to a goal of 150 minutes of combined cardio and strengthening exercise per week for weight loss and overall health benefits. We discussed the following Behavioral Modification Stratagies today: increasing lean protein intake and decreasing simple carbohydrates    Kemya has agreed to follow up with our clinic in 2 weeks. She was informed of the importance of frequent follow up visits to maximize her success with intensive lifestyle modifications for her multiple health conditions.   OBESITY BEHAVIORAL INTERVENTION VISIT  Today's visit was # 2   Starting weight: 171 Starting date: 02/06/18 Today's weight : Weight: 166 lb (75.3 kg)  Today's date: 02/21/18 Total lbs lost to date: 5    ASK: We discussed the diagnosis of obesity with [name] today and Remmy agreed to give [name] permission to discuss obesity behavioral modification therapy  today.  ASSESS: Aivah has the diagnosis of obesity and her BMI today is @TBMI @ Lisel is in the action stage of change   ADVISE: Cheyne was educated on the multiple health risks of obesity as well as the benefit of weight loss to improve her health. She was advised of the need for long term treatment and the importance of lifestyle modifications to improve her current health and to decrease her risk of future health problems.  AGREE: Multiple dietary modification options and treatment options were discussed and  Seema agreed to follow the recommendations documented in the above note.  ARRANGE: Chasey was educated on the importance of frequent visits to treat obesity as outlined per CMS and USPSTF guidelines and agreed to schedule her next follow up appointment today.  I, April Moore, am acting as Location manager for Dr Dennard Nip.   I have reviewed the above documentation for accuracy and completeness, and I agree with the above. -Dennard Nip, MD

## 2018-03-11 ENCOUNTER — Ambulatory Visit (INDEPENDENT_AMBULATORY_CARE_PROVIDER_SITE_OTHER): Payer: Managed Care, Other (non HMO) | Admitting: Family Medicine

## 2018-03-11 VITALS — BP 128/75 | HR 59 | Temp 98.3°F | Ht 61.0 in | Wt 165.0 lb

## 2018-03-11 DIAGNOSIS — E669 Obesity, unspecified: Secondary | ICD-10-CM

## 2018-03-11 DIAGNOSIS — F418 Other specified anxiety disorders: Secondary | ICD-10-CM

## 2018-03-11 DIAGNOSIS — E119 Type 2 diabetes mellitus without complications: Secondary | ICD-10-CM

## 2018-03-11 DIAGNOSIS — Z6831 Body mass index (BMI) 31.0-31.9, adult: Secondary | ICD-10-CM | POA: Diagnosis not present

## 2018-03-13 NOTE — Progress Notes (Signed)
Office: [phone]  /  Fax: [phone]   HPI:   Chief Complaint: OBESITY Amy Lang is here to discuss her progress with her obesity treatment plan. She is on the Category 2 plan and is following her eating plan approximately 80 % of the time. She states she is walking 20 minutes 3 times per week. Amy Lang continues to do well with weight loss, even with increased traveling and eating out. She was very mindful of her food choices and she is anxious about getting healthier. Her weight is 165 lb (74.8 kg) today and has had a weight loss of 1 pound over a period of 3 weeks since her last visit. She has lost 6 lbs since starting treatment with [name].  Diabetes II Amy Lang has a diagnosis of diabetes type II. Amy Lang denies any hypoglycemic episodes. Last A1c was at 6.0 and is improving with diet, exercise and medications. She has been working on intensive lifestyle modifications including diet, exercise, and weight loss to help control her blood glucose levels.  Depression with Anxiety Amy Lang is on Prozac and Topiramate, but she states that losing weight has increased her anxiety. She has many questions about her diabetes and has even discussed her death wishes with her husband. She denies any suicidal or homicidal ideations. Amy Lang struggles with emotional eating and using food for comfort to the extent that it is negatively impacting her health. She often snacks when she is not hungry. Amy Lang sometimes feels she is out of control and then feels guilty that she made poor food choices. She has been working on behavior modification techniques to help reduce her emotional eating and has been somewhat successful.   Depression screen Amy Lang 2/9 02/06/2018 09/11/2016  Decreased Interest 0 0  Down, Depressed, Hopeless 0 0  PHQ - 2 Score 0 0  Altered sleeping 0 -  Tired, decreased energy 1 -  Change in appetite 1 -  Feeling bad or failure about yourself  0 -  Trouble concentrating 1 -  Moving slowly or fidgety/restless 0 -    Suicidal thoughts 0 -  PHQ-9 Score 3 -  Difficult doing work/chores Not difficult at all -      ALLERGIES: Allergies  Allergen Reactions  . Bee Pollen Anaphylaxis  . Butorphanol   . Butorphanol Tartrate Other (See Comments)    REACTION: hallucinations ( stadol)  . Codeine   . Codeine Sulfate Hives and Nausea And Vomiting  . Crestor [Rosuvastatin] Other (See Comments)    Body aches  . Darvocet [Propoxyphene N-Acetaminophen] Hives and Nausea And Vomiting  . Estrogens   . Levofloxacin Other (See Comments)    abdominal pain  . Metformin And Related     diarrhea  . Penicillamine   . Penicillins Other (See Comments)    Seizure   . Sulfa Antibiotics Hives and Nausea And Vomiting  . Latex Rash    Blisters and redness with bandaids-also sensitivity to latex gloves    MEDICATIONS: Current Outpatient Medications on File Prior to Visit  Medication Sig Dispense Refill  . aspirin EC 81 MG tablet Take 81 mg by mouth every evening.     . cetirizine (ZYRTEC) 10 MG tablet Take 10 mg by mouth daily.     . cyanocobalamin (,VITAMIN B-12,) 1000 MCG/ML injection 1 ml IM or SQ every 2 weeks (lifetime) 10 mL 5  . Ertugliflozin L-PyroglutamicAc (STEGLATRO) 5 MG TABS Take 5 mg by mouth daily. 30 tablet 11  . FLUoxetine (PROZAC) 20 MG tablet Take 1 tablet (  20 mg total) by mouth daily. 30 tablet 5  . losartan (COZAAR) 100 MG tablet Take 1 tablet (100 mg total) by mouth daily. 90 tablet 3  . metFORMIN (GLUCOPHAGE) 500 MG tablet Take 1 tablet (500 mg total) by mouth daily with breakfast. 30 tablet 0  . Multiple Vitamin (MULTIVITAMIN WITH MINERALS) TABS Take 1 tablet by mouth daily.    . Multiple Vitamins-Minerals (PRESERVISION AREDS 2) CAPS Take 1 capsule by mouth at bedtime.    . Omega-3 Fatty Acids (FISH OIL) 1000 MG CAPS Take 1,000 mg by mouth daily.    . pantoprazole (PROTONIX) 20 MG tablet Take 1 tablet (20 mg total) by mouth daily before breakfast. 90 tablet 3  . Probiotic Product (ALIGN) 4  MG CAPS Take 1 capsule by mouth daily. 30 capsule 1  . topiramate (TOPAMAX) 200 MG tablet Take 1 tablet (200 mg total) by mouth daily. Must keep OV in March to get refills 90 tablet 3  . vitamin C (ASCORBIC ACID) 500 MG tablet Take 500 mg by mouth daily.     . Vitamin D, Ergocalciferol, (DRISDOL) 50000 units CAPS capsule Take 1 capsule (50,000 Units total) by mouth every 3 (three) days. 10 capsule 0  . vitamin E 1000 UNIT capsule Take 1,000 Units by mouth daily.     No current facility-administered medications on file prior to visit.     PAST MEDICAL HISTORY: Past Medical History:  Diagnosis Date  . Allergy   . Anxiety   . Asthma   . Depression   . Diabetes ([place])   . Dyslipidemia   . Hypertension   . IBS (irritable bowel syndrome)   . Lactose intolerance   . Migraines   . Obesity   . PONV (postoperative nausea and vomiting)   . Shortness of breath   . Vaginal cancer ([place])   . Vitamin B 12 deficiency   . Vitamin D deficiency     PAST SURGICAL HISTORY: Past Surgical History:  Procedure Laterality Date  . ABDOMINAL HYSTERECTOMY     still has bilateral ovaries  . BREAST LUMPECTOMY WITH RADIOACTIVE SEED LOCALIZATION Right 08/28/2017   Procedure: BREAST LUMPECTOMY WITH RADIOACTIVE SEED LOCALIZATION;  Surgeon: Erroll Luna, MD;  Location: [facility];  Service: General;  Laterality: Right;  . CHOLECYSTECTOMY  04-04-11   single site  . COLONOSCOPY W/ BIOPSIES  01/24/2013  . ESOPHAGOGASTRODUODENOSCOPY    . KNEE SURGERY    . LAPAROTOMY  05/12/2012   Procedure: LAPAROTOMY;  Surgeon: Cyril Mourning, MD;  Location: [facility];  Service: Gynecology;;  laparotomy [name] salpingectomy-oopharectomy  . NM MYOCAR PERF WALL MOTION  09/06/2009   normal  . TRANSTHORACIC ECHOCARDIOGRAM  March 2014   Normal regional wall motion. EF 55%. Mild MR    SOCIAL HISTORY: Social History   Tobacco Use  . Smoking status: Never Smoker  . Smokeless tobacco: Never Used  . Tobacco  comment: Regular Exercise - No  Substance Use Topics  . Alcohol use: No  . Drug use: No    FAMILY HISTORY: Family History  Problem Relation Age of Onset  . Heart disease Mother   . COPD Mother   . Heart attack Mother 72  . Diabetes Mother   . Hypertension Mother   . Hyperlipidemia Mother   . Kidney disease Mother   . Thyroid disease Mother   . Lung cancer Father   . Hypertension Father   . Hyperlipidemia Father   . Heart disease Maternal Grandmother   .  Breast cancer Maternal Grandmother 62  . Heart disease Maternal Grandfather   . Heart disease Paternal Grandmother   . Heart disease Paternal Grandfather   . Colon cancer Neg Hx   . Esophageal cancer Neg Hx   . Rectal cancer Neg Hx   . Stomach cancer Neg Hx     ROS: Review of Systems  Constitutional: Positive for weight loss.  Endo/Heme/Allergies:       Negative for hypoglycemia  Psychiatric/Behavioral: Positive for depression. Negative for suicidal ideas. The patient is nervous/anxious (anxiety).     PHYSICAL EXAM: Blood pressure 128/75, pulse (!) 59, temperature 98.3 F (36.8 C), temperature source Oral, height 5\' 1"  (1.549 m), weight 165 lb (74.8 kg), SpO2 100 %. Body mass index is 31.18 kg/m. Physical Exam  Constitutional: She is oriented to person, place, and time. She appears well-developed and well-nourished.  Cardiovascular: Normal rate.  Pulmonary/Chest: Effort normal.  Musculoskeletal: Normal range of motion.  Neurological: She is oriented to person, place, and time.  Skin: Skin is warm and dry.  Psychiatric: Her behavior is normal. Her mood appears anxious. She exhibits a depressed mood. She expresses no homicidal and no suicidal ideation.  Vitals reviewed.   RECENT LABS AND TESTS: BMET    Component Value Date/Time   NA 143 02/06/2018 1246   K 4.4 02/06/2018 1246   CL 106 02/06/2018 1246   CO2 19 (L) 02/06/2018 1246   GLUCOSE 84 02/06/2018 1246   GLUCOSE 126 (H) 11/19/2017 1051   BUN 19  02/06/2018 1246   CREATININE 0.72 02/06/2018 1246   CALCIUM 8.9 02/06/2018 1246   GFRNONAA 95 02/06/2018 1246   GFRAA 110 02/06/2018 1246   Lab Results  Component Value Date   HGBA1C 6.0 (H) 02/06/2018   HGBA1C 6.8 (H) 11/19/2017   Lab Results  Component Value Date   INSULIN 18.3 02/06/2018   CBC    Component Value Date/Time   WBC 5.7 02/06/2018 1246   WBC 5.9 10/01/2017 0938   RBC 4.58 02/06/2018 1246   RBC 4.77 10/01/2017 0938   HGB 13.8 02/06/2018 1246   HCT 42.8 02/06/2018 1246   PLT 187.0 10/01/2017 0938   PLT WILL FOLLOW 09/12/2016 0832   MCV 93 02/06/2018 1246   MCH 30.1 02/06/2018 1246   MCH 30.5 05/13/2012 0500   MCHC 32.2 02/06/2018 1246   MCHC 33.8 10/01/2017 0938   RDW 13.8 02/06/2018 1246   LYMPHSABS 2.0 02/06/2018 1246   MONOABS 0.5 10/01/2017 0938   EOSABS 0.0 02/06/2018 1246   BASOSABS 0.0 02/06/2018 1246   Iron/TIBC/Ferritin/ %Sat No results found for: IRON, TIBC, FERRITIN, IRONPCTSAT Lipid Panel     Component Value Date/Time   CHOL 168 02/06/2018 1246   TRIG 144 02/06/2018 1246   HDL 48 02/06/2018 1246   CHOLHDL 4 11/19/2017 1051   VLDL 38.8 11/19/2017 1051   LDLCALC 91 02/06/2018 1246   LDLDIRECT 96.0 10/01/2017 0938   Hepatic Function Panel     Component Value Date/Time   PROT 6.8 02/06/2018 1246   ALBUMIN 4.4 02/06/2018 1246   AST 23 02/06/2018 1246   ALT 30 02/06/2018 1246   ALKPHOS 82 02/06/2018 1246   BILITOT 0.3 02/06/2018 1246   BILIDIR 0.1 10/01/2017 0938   BILIDIR 0.10 08/14/2016 1135   IBILI NOT CALCULATED 04/03/2011 0558      Component Value Date/Time   TSH 2.030 02/06/2018 1246   TSH 2.24 10/01/2017 0938   TSH 1.90 09/11/2016 1406   Results for Beaulieu, Shenelle  Jacinto Reap (MRN [id number]) as of 03/13/2018 14:20  Ref. Range 02/06/2018 12:46  Vitamin D, 25-Hydroxy Latest Ref Range: 30.0 - 100.0 ng/mL 37.6   ASSESSMENT AND PLAN: Type 2 diabetes mellitus without complication, without long-term current use of insulin  (HCC)  Depression with anxiety  Class 1 obesity with serious comorbidity and body mass index (BMI) of 31.0 to 31.9 in adult, unspecified obesity type  PLAN:  Diabetes II Amy Lang was offered extensive diabetes education by myself today, and she feels better about getting healthier. We discussed the importance of good blood sugar control to decrease the likelihood of diabetic complications such as nephropathy, neuropathy, limb loss, blindness, coronary artery disease, and death. We discussed the importance of intensive lifestyle modification including diet, exercise and weight loss as the first line treatment for diabetes. Amy Lang agrees to continue her diabetes medications and will follow up at the agreed upon time.  Depression with Anxiety We discussed behavior modification techniques today to help Amy Lang deal with her emotional eating and depression. She was educated on diabetes and how working on her diet and lifestyle changes would help her get healthy and stay healthy. Amy Lang states she felt reassured at the end of her visit today.  I spent > than 50% of the 25 minute visit on counseling as documented in the note.  Obesity Amy Lang is currently in the action stage of change. As such, her goal is to continue with weight loss efforts She has agreed to follow the Category 2 plan Amy Lang has been instructed to work up to a goal of 150 minutes of combined cardio and strengthening exercise per week for weight loss and overall health benefits. We discussed the following Behavioral Modification Strategies today: increasing lean protein intake, decreasing simple carbohydrates  and work on meal planning and easy cooking plans  Amy Lang has agreed to follow up with our clinic in 2 to 3 weeks. She was informed of the importance of frequent follow up visits to maximize her success with intensive lifestyle modifications for her multiple health conditions.   OBESITY BEHAVIORAL INTERVENTION VISIT  Today's visit was # 3    Starting weight: 171 lbs Starting date: 02/06/18 Today's weight : 165 lbs  Today's date: 03/11/2018 Total lbs lost to date: 6   ASK: We discussed the diagnosis of obesity with Amy Lang today and Amy Lang agreed to give [name] permission to discuss obesity behavioral modification therapy today.  ASSESS: Amy Lang has the diagnosis of obesity and her BMI today is 31.19 Amy Lang is in the action stage of change   ADVISE: Amy Lang was educated on the multiple health risks of obesity as well as the benefit of weight loss to improve her health. She was advised of the need for long term treatment and the importance of lifestyle modifications to improve her current health and to decrease her risk of future health problems.  AGREE: Multiple dietary modification options and treatment options were discussed and  Amy Lang agreed to follow the recommendations documented in the above note.  ARRANGE: Amy Lang was educated on the importance of frequent visits to treat obesity as outlined per CMS and USPSTF guidelines and agreed to schedule her next follow up appointment today.  I, Doreene Nest, am acting as transcriptionist for Dennard Nip, MD  I have reviewed the above documentation for accuracy and completeness, and I agree with the above. -Dennard Nip, MD

## 2018-03-18 ENCOUNTER — Encounter: Payer: Self-pay | Admitting: Family

## 2018-03-18 ENCOUNTER — Ambulatory Visit (INDEPENDENT_AMBULATORY_CARE_PROVIDER_SITE_OTHER): Payer: Managed Care, Other (non HMO) | Admitting: Family

## 2018-03-18 DIAGNOSIS — J019 Acute sinusitis, unspecified: Secondary | ICD-10-CM | POA: Insufficient documentation

## 2018-03-18 MED ORDER — DOXYCYCLINE HYCLATE 100 MG PO TABS: 100.0000 mg | ORAL_TABLET | Freq: Two times a day (BID) | ORAL | 0 refills | Status: DC

## 2018-03-18 MED ORDER — ALBUTEROL SULFATE HFA 108 (90 BASE) MCG/ACT IN AERS: 2.0000 | INHALATION_SPRAY | Freq: Four times a day (QID) | RESPIRATORY_TRACT | 0 refills | Status: DC | PRN

## 2018-03-18 MED ORDER — FLUTICASONE PROPIONATE 50 MCG/ACT NA SUSP: 2.0000 | Freq: Every day | NASAL | 6 refills | Status: DC

## 2018-03-18 NOTE — Progress Notes (Signed)
Amy Lang is a 52 y.o. female with the following history as recorded in Epic[employer]:  Patient Active Problem List   Diagnosis Date Noted  . Acute sinusitis 03/18/2018  . Other fatigue 02/06/2018  . Exercise-induced asthma 02/06/2018  . Type 2 diabetes mellitus without complication, without long-term current use of insulin ([place]) 02/06/2018  . Vitamin D deficiency 02/06/2018  . Metabolic syndrome 01/75/1025  . Hyperglycemia 11/19/2017  . Well adult exam 09/11/2016  . Adenopathy 01/13/2015  . Trigeminal neuralgia of left side of face 12/07/2014  . Abdominal pain, epigastric 05/14/2014  . Nausea with vomiting 05/14/2014  . Eye exam abnormal, was told she had bled behind eye 08/22/2013  . Obesity (BMI 30-39.9) 02/17/2013  . Dizziness 02/17/2013  . Family history of coronary artery disease 02/17/2013  . IBS (irritable bowel syndrome)   . Essential hypertension   . Hyperlipidemia   . Diarrhea - suspect IBS 12/23/2012  . Hematochezia 12/23/2012  . B12 deficiency 04/15/2012  . Hot flashes 04/15/2012  . ALLERGIC RHINITIS 12/02/2009  . Asthma 12/02/2009  . Anxiety state 06/30/2009  . DEPRESSION 06/30/2009  . LIPOMAS, MULTIPLE 06/29/2009  . GRIEF REACTION 06/29/2009  . Migraine 06/29/2009  . DYSPNEA 06/29/2009    Current Outpatient Medications  Medication Sig Dispense Refill  . aspirin EC 81 MG tablet Take 81 mg by mouth every evening.     . cetirizine (ZYRTEC) 10 MG tablet Take 10 mg by mouth daily.     . cyanocobalamin (,VITAMIN B-12,) 1000 MCG/ML injection 1 ml IM or SQ every 2 weeks (lifetime) 10 mL 5  . Ertugliflozin L-PyroglutamicAc (STEGLATRO) 5 MG TABS Take 5 mg by mouth daily. 30 tablet 11  . FLUoxetine (PROZAC) 20 MG tablet Take 1 tablet (20 mg total) by mouth daily. 30 tablet 5  . losartan (COZAAR) 100 MG tablet Take 1 tablet (100 mg total) by mouth daily. 90 tablet 3  . metFORMIN (GLUCOPHAGE) 500 MG tablet Take 1 tablet (500 mg total) by mouth daily with breakfast. 30  tablet 0  . Multiple Vitamin (MULTIVITAMIN WITH MINERALS) TABS Take 1 tablet by mouth daily.    . Multiple Vitamins-Minerals (PRESERVISION AREDS 2) CAPS Take 1 capsule by mouth at bedtime.    . Omega-3 Fatty Acids (FISH OIL) 1000 MG CAPS Take 1,000 mg by mouth daily.    . pantoprazole (PROTONIX) 20 MG tablet Take 1 tablet (20 mg total) by mouth daily before breakfast. 90 tablet 3  . Probiotic Product (ALIGN) 4 MG CAPS Take 1 capsule by mouth daily. 30 capsule 1  . topiramate (TOPAMAX) 200 MG tablet Take 1 tablet (200 mg total) by mouth daily. Must keep OV in March to get refills 90 tablet 3  . vitamin C (ASCORBIC ACID) 500 MG tablet Take 500 mg by mouth daily.     . Vitamin D, Ergocalciferol, (DRISDOL) 50000 units CAPS capsule Take 1 capsule (50,000 Units total) by mouth every 3 (three) days. 10 capsule 0  . vitamin E 1000 UNIT capsule Take 1,000 Units by mouth daily.    [name] albuterol (PROVENTIL HFA;VENTOLIN HFA) 108 (90 Base) MCG/ACT inhaler Inhale 2 puffs into the lungs every 6 (six) hours as needed for wheezing or shortness of breath. 1 Inhaler 0  . doxycycline (VIBRA-TABS) 100 MG tablet Take 1 tablet (100 mg total) by mouth 2 (two) times daily. 20 tablet 0  . fluticasone (FLONASE) 50 MCG/ACT nasal spray Place 2 sprays into both nostrils daily. 16 g 6   No current  facility-administered medications for this visit.     Allergies: Bee pollen; Butorphanol; Butorphanol tartrate; Codeine; Codeine sulfate; Crestor [rosuvastatin]; Darvocet [propoxyphene n-acetaminophen]; Estrogens; Levofloxacin; Metformin and related; Penicillamine; Penicillins; Sulfa antibiotics; and Latex  Past Medical History:  Diagnosis Date  . Allergy   . Anxiety   . Asthma   . Depression   . Diabetes ([place])   . Dyslipidemia   . Hypertension   . IBS (irritable bowel syndrome)   . Lactose intolerance   . Migraines   . Obesity   . PONV (postoperative nausea and vomiting)   . Shortness of breath   . Vaginal cancer ([place])    . Vitamin B 12 deficiency   . Vitamin D deficiency     Past Surgical History:  Procedure Laterality Date  . ABDOMINAL HYSTERECTOMY     still has bilateral ovaries  . BREAST LUMPECTOMY WITH RADIOACTIVE SEED LOCALIZATION Right 08/28/2017   Procedure: BREAST LUMPECTOMY WITH RADIOACTIVE SEED LOCALIZATION;  Surgeon: Erroll Luna, MD;  Location: [facility];  Service: General;  Laterality: Right;  . CHOLECYSTECTOMY  04-04-11   single site  . COLONOSCOPY W/ BIOPSIES  01/24/2013  . ESOPHAGOGASTRODUODENOSCOPY    . KNEE SURGERY    . LAPAROTOMY  05/12/2012   Procedure: LAPAROTOMY;  Surgeon: Cyril Mourning, MD;  Location: [facility];  Service: Gynecology;;  laparotomy [name] salpingectomy-oopharectomy  . NM MYOCAR PERF WALL MOTION  09/06/2009   normal  . TRANSTHORACIC ECHOCARDIOGRAM  March 2014   Normal regional wall motion. EF 55%. Mild MR    Family History  Problem Relation Age of Onset  . Heart disease Mother   . COPD Mother   . Heart attack Mother 101  . Diabetes Mother   . Hypertension Mother   . Hyperlipidemia Mother   . Kidney disease Mother   . Thyroid disease Mother   . Lung cancer Father   . Hypertension Father   . Hyperlipidemia Father   . Heart disease Maternal Grandmother   . Breast cancer Maternal Grandmother 65  . Heart disease Maternal Grandfather   . Heart disease Paternal Grandmother   . Heart disease Paternal Grandfather   . Colon cancer Neg Hx   . Esophageal cancer Neg Hx   . Rectal cancer Neg Hx   . Stomach cancer Neg Hx     Social History   Tobacco Use  . Smoking status: Never Smoker  . Smokeless tobacco: Never Used  . Tobacco comment: Regular Exercise - No  Substance Use Topics  . Alcohol use: No    Subjective:  Patient presents with concerns x 1 week; + prone to sinus infections; + teeth hurt, ears hurt; feels like congestion is moving into her chest; needs refill on her albuterol inhaler.     Objective:  Vitals:   03/18/18 1108   BP: 118/76  Pulse: 72  Temp: 98.2 F (36.8 C)  TempSrc: Oral  SpO2: 99%  Weight: 166 lb (75.3 kg)  Height: 5\' 1"  (1.549 m)    General: Well developed, well nourished, in no acute distress  Skin : Warm and dry.  Head: Normocephalic and atraumatic  Eyes: Sclera and conjunctiva clear; pupils round and reactive to light; extraocular movements intact  Ears: External normal; canals clear; tympanic membranes normal  Oropharynx: Pink, supple. No suspicious lesions  Neck: Supple without thyromegaly, adenopathy  Lungs: Respirations unlabored; clear to auscultation bilaterally without wheeze, rales, rhonchi  CVS exam: normal rate and regular rhythm.  Neurologic: Alert and oriented; speech  intact; face symmetrical; moves all extremities well; CNII-XII intact without focal deficit   Assessment:  1. Acute sinusitis, recurrence not specified, unspecified location     Plan:  Rx for Doxycycline 100 mg bid x 10 days, Flonase and Proventil- use as directed; increase fluids, rest and follow-up worse, no better.  Patient will plan to return for her flu shot and pneumonia shot in the next 1-2 weeks; will need Prevnar first.   No follow-ups on file.  No orders of the defined types were placed in this encounter.   Requested Prescriptions   Signed Prescriptions Disp Refills  . fluticasone (FLONASE) 50 MCG/ACT nasal spray 16 g 6    Sig: Place 2 sprays into both nostrils daily.  [name] doxycycline (VIBRA-TABS) 100 MG tablet 20 tablet 0    Sig: Take 1 tablet (100 mg total) by mouth 2 (two) times daily.  [name] albuterol (PROVENTIL HFA;VENTOLIN HFA) 108 (90 Base) MCG/ACT inhaler 1 Inhaler 0    Sig: Inhale 2 puffs into the lungs every 6 (six) hours as needed for wheezing or shortness of breath.

## 2018-03-21 ENCOUNTER — Other Ambulatory Visit (INDEPENDENT_AMBULATORY_CARE_PROVIDER_SITE_OTHER): Payer: Self-pay | Admitting: Family Medicine

## 2018-03-21 DIAGNOSIS — E559 Vitamin D deficiency, unspecified: Secondary | ICD-10-CM

## 2018-03-29 ENCOUNTER — Ambulatory Visit (INDEPENDENT_AMBULATORY_CARE_PROVIDER_SITE_OTHER): Payer: Managed Care, Other (non HMO)

## 2018-03-29 DIAGNOSIS — Z23 Encounter for immunization: Secondary | ICD-10-CM

## 2018-04-01 ENCOUNTER — Ambulatory Visit (INDEPENDENT_AMBULATORY_CARE_PROVIDER_SITE_OTHER): Payer: Managed Care, Other (non HMO) | Admitting: Family Medicine

## 2018-04-01 VITALS — BP 113/70 | HR 61 | Temp 98.2°F | Ht 61.0 in | Wt 161.0 lb

## 2018-04-01 DIAGNOSIS — E119 Type 2 diabetes mellitus without complications: Secondary | ICD-10-CM

## 2018-04-01 DIAGNOSIS — Z6830 Body mass index (BMI) 30.0-30.9, adult: Secondary | ICD-10-CM

## 2018-04-01 DIAGNOSIS — E669 Obesity, unspecified: Secondary | ICD-10-CM | POA: Diagnosis not present

## 2018-04-02 ENCOUNTER — Encounter (INDEPENDENT_AMBULATORY_CARE_PROVIDER_SITE_OTHER): Payer: Self-pay | Admitting: Family Medicine

## 2018-04-02 NOTE — Progress Notes (Signed)
Office: [phone]  /  Fax: [phone]   HPI:   Chief Complaint: OBESITY Amy Lang is here to discuss her progress with her obesity treatment plan. She is on the Category 2 plan and is following her eating plan approximately 70 % of the time. She states she is walking 30 minutes 3 times per week. Amy Lang continues to do well with weight loss. She is doing better with meal prep and planning and she notes that excessive hunger is better controlled , especially in the morning. Her weight is 161 lb (73 kg) today and has had a weight loss of 4 pounds over a period of 3 weeks since her last visit. She has lost 10 lbs since starting treatment with [name].  Non insulin dependant Diabetes II without complications Amy Lang has a diagnosis of diabetes type II. She started Metformin at 500 mg in the morning, but she had significant GI upset, so we decreased the dose to half a pill in the morning and her GI upset resolved. Amy Lang notes decreased polyphagia in the morning, but increased polyphagia in the afternoon. Amy Lang denies any hypoglycemic episodes. Last A1c was at 6.0 She has been working on intensive lifestyle modifications including diet, exercise, and weight loss to help control her blood glucose levels.  ALLERGIES: Allergies  Allergen Reactions  . Bee Pollen Anaphylaxis  . Butorphanol   . Butorphanol Tartrate Other (See Comments)    REACTION: hallucinations ( stadol)  . Codeine   . Codeine Sulfate Hives and Nausea And Vomiting  . Crestor [Rosuvastatin] Other (See Comments)    Body aches  . Darvocet [Propoxyphene N-Acetaminophen] Hives and Nausea And Vomiting  . Estrogens   . Levofloxacin Other (See Comments)    abdominal pain  . Metformin And Related     diarrhea  . Penicillamine   . Penicillins Other (See Comments)    Seizure   . Sulfa Antibiotics Hives and Nausea And Vomiting  . Latex Rash    Blisters and redness with bandaids-also sensitivity to latex gloves    MEDICATIONS: Current  Outpatient Medications on File Prior to Visit  Medication Sig Dispense Refill  . albuterol (PROVENTIL HFA;VENTOLIN HFA) 108 (90 Base) MCG/ACT inhaler Inhale 2 puffs into the lungs every 6 (six) hours as needed for wheezing or shortness of breath. 1 Inhaler 0  . aspirin EC 81 MG tablet Take 81 mg by mouth every evening.     . cetirizine (ZYRTEC) 10 MG tablet Take 10 mg by mouth daily.     . cyanocobalamin (,VITAMIN B-12,) 1000 MCG/ML injection 1 ml IM or SQ every 2 weeks (lifetime) 10 mL 5  . doxycycline (VIBRA-TABS) 100 MG tablet Take 1 tablet (100 mg total) by mouth 2 (two) times daily. 20 tablet 0  . Ertugliflozin L-PyroglutamicAc (STEGLATRO) 5 MG TABS Take 5 mg by mouth daily. 30 tablet 11  . FLUoxetine (PROZAC) 20 MG tablet Take 1 tablet (20 mg total) by mouth daily. 30 tablet 5  . fluticasone (FLONASE) 50 MCG/ACT nasal spray Place 2 sprays into both nostrils daily. 16 g 6  . losartan (COZAAR) 100 MG tablet Take 1 tablet (100 mg total) by mouth daily. 90 tablet 3  . metFORMIN (GLUCOPHAGE) 500 MG tablet Take 1 tablet (500 mg total) by mouth daily with breakfast. (Patient taking differently: Take 250 mg by mouth daily with breakfast. ) 30 tablet 0  . Multiple Vitamin (MULTIVITAMIN WITH MINERALS) TABS Take 1 tablet by mouth daily.    . Multiple Vitamins-Minerals (PRESERVISION AREDS  2) CAPS Take 1 capsule by mouth at bedtime.    . Omega-3 Fatty Acids (FISH OIL) 1000 MG CAPS Take 1,000 mg by mouth daily.    . pantoprazole (PROTONIX) 20 MG tablet Take 1 tablet (20 mg total) by mouth daily before breakfast. 90 tablet 3  . Probiotic Product (ALIGN) 4 MG CAPS Take 1 capsule by mouth daily. 30 capsule 1  . topiramate (TOPAMAX) 200 MG tablet Take 1 tablet (200 mg total) by mouth daily. Must keep OV in March to get refills 90 tablet 3  . vitamin C (ASCORBIC ACID) 500 MG tablet Take 500 mg by mouth daily.     . Vitamin D, Ergocalciferol, (DRISDOL) 50000 units CAPS capsule TAKE 1 CAPSULE (50,000 UNITS  TOTAL) BY MOUTH EVERY 3 (THREE) DAYS. 10 capsule 0  . vitamin E 1000 UNIT capsule Take 1,000 Units by mouth daily.     No current facility-administered medications on file prior to visit.     PAST MEDICAL HISTORY: Past Medical History:  Diagnosis Date  . Allergy   . Anxiety   . Asthma   . Depression   . Diabetes ([place])   . Dyslipidemia   . Hypertension   . IBS (irritable bowel syndrome)   . Lactose intolerance   . Migraines   . Obesity   . PONV (postoperative nausea and vomiting)   . Shortness of breath   . 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 Vitamin B 12 deficiency   . Vitamin D deficiency     PAST SURGICAL HISTORY: Past Surgical History:  Procedure Laterality Date  . ABDOMINAL HYSTERECTOMY     still has bilateral ovaries  . BREAST LUMPECTOMY WITH RADIOACTIVE SEED LOCALIZATION Right 08/28/2017   Procedure: BREAST LUMPECTOMY WITH RADIOACTIVE SEED LOCALIZATION;  Surgeon: Erroll Luna, MD;  Location: [facility];  Service: General;  Laterality: Right;  . CHOLECYSTECTOMY  04-04-11   single site  . COLONOSCOPY W/ BIOPSIES  01/24/2013  . ESOPHAGOGASTRODUODENOSCOPY    . KNEE SURGERY    . LAPAROTOMY  05/12/2012   Procedure: LAPAROTOMY;  Surgeon: Cyril Mourning, MD;  Location: [facility];  Service: Gynecology;;  laparotomy [name] salpingectomy-oopharectomy  . NM MYOCAR PERF WALL MOTION  09/06/2009   normal  . TRANSTHORACIC ECHOCARDIOGRAM  March 2014   Normal regional wall motion. EF 55%. Mild MR    SOCIAL HISTORY: Social History   Tobacco Use  . Smoking status: Never Smoker  . Smokeless tobacco: Never Used  . Tobacco comment: Regular Exercise - No  Substance Use Topics  . Alcohol use: No  . Drug use: No    FAMILY HISTORY: Family History  Problem Relation Age of Onset  . Heart disease Mother   . COPD Mother   . Heart attack Mother 53  . Diabetes Mother   . Hypertension Mother   . Hyperlipidemia Mother   . Kidney disease Mother   . Thyroid disease Mother    . Lung cancer Father   . Hypertension Father   . Hyperlipidemia Father   . Heart disease Maternal Grandmother   . Breast cancer Maternal Grandmother 54  . Heart disease Maternal Grandfather   . Heart disease Paternal Grandmother   . Heart disease Paternal Grandfather   . Colon cancer Neg Hx   . Esophageal cancer Neg Hx   . Rectal cancer Neg Hx   . Stomach cancer Neg Hx     ROS: Review of Systems  Constitutional: Positive for weight loss.  Endo/Heme/Allergies:       +  Polyphagia Negative for hypoglycemia    PHYSICAL EXAM: Blood pressure 113/70, pulse 61, temperature 98.2 F (36.8 C), temperature source Oral, height 5\' 1"  (1.549 m), weight 161 lb (73 kg), SpO2 100 %. Body mass index is 30.42 kg/m. Physical Exam  Constitutional: She is oriented to person, place, and time. She appears well-developed and well-nourished.  Cardiovascular: Normal rate.  Pulmonary/Chest: Effort normal.  Musculoskeletal: Normal range of motion.  Neurological: She is oriented to person, place, and time.  Skin: Skin is warm and dry.  Psychiatric: She has a normal mood and affect. Her behavior is normal.  Vitals reviewed.   RECENT LABS AND TESTS: BMET    Component Value Date/Time   NA 143 02/06/2018 1246   K 4.4 02/06/2018 1246   CL 106 02/06/2018 1246   CO2 19 (L) 02/06/2018 1246   GLUCOSE 84 02/06/2018 1246   GLUCOSE 126 (H) 11/19/2017 1051   BUN 19 02/06/2018 1246   CREATININE 0.72 02/06/2018 1246   CALCIUM 8.9 02/06/2018 1246   GFRNONAA 95 02/06/2018 1246   GFRAA 110 02/06/2018 1246   Lab Results  Component Value Date   HGBA1C 6.0 (H) 02/06/2018   HGBA1C 6.8 (H) 11/19/2017   Lab Results  Component Value Date   INSULIN 18.3 02/06/2018   CBC    Component Value Date/Time   WBC 5.7 02/06/2018 1246   WBC 5.9 10/01/2017 0938   RBC 4.58 02/06/2018 1246   RBC 4.77 10/01/2017 0938   HGB 13.8 02/06/2018 1246   HCT 42.8 02/06/2018 1246   PLT 187.0 10/01/2017 0938   PLT WILL  FOLLOW 09/12/2016 0832   MCV 93 02/06/2018 1246   MCH 30.1 02/06/2018 1246   MCH 30.5 05/13/2012 0500   MCHC 32.2 02/06/2018 1246   MCHC 33.8 10/01/2017 0938   RDW 13.8 02/06/2018 1246   LYMPHSABS 2.0 02/06/2018 1246   MONOABS 0.5 10/01/2017 0938   EOSABS 0.0 02/06/2018 1246   BASOSABS 0.0 02/06/2018 1246   Iron/TIBC/Ferritin/ %Sat No results found for: IRON, TIBC, FERRITIN, IRONPCTSAT Lipid Panel     Component Value Date/Time   CHOL 168 02/06/2018 1246   TRIG 144 02/06/2018 1246   HDL 48 02/06/2018 1246   CHOLHDL 4 11/19/2017 1051   VLDL 38.8 11/19/2017 1051   LDLCALC 91 02/06/2018 1246   LDLDIRECT 96.0 10/01/2017 0938   Hepatic Function Panel     Component Value Date/Time   PROT 6.8 02/06/2018 1246   ALBUMIN 4.4 02/06/2018 1246   AST 23 02/06/2018 1246   ALT 30 02/06/2018 1246   ALKPHOS 82 02/06/2018 1246   BILITOT 0.3 02/06/2018 1246   BILIDIR 0.1 10/01/2017 0938   BILIDIR 0.10 08/14/2016 1135   IBILI NOT CALCULATED 04/03/2011 0558      Component Value Date/Time   TSH 2.030 02/06/2018 1246   TSH 2.24 10/01/2017 0938   TSH 1.90 09/11/2016 1406   Results for Amy Lang, Amy Lang (MRN [id number]) as of 04/02/2018 10:14  Ref. Range 02/06/2018 12:46  Vitamin D, 25-Hydroxy Latest Ref Range: 30.0 - 100.0 ng/mL 37.6  ASSESSMENT AND PLAN: Type 2 diabetes mellitus without complication, without long-term current use of insulin (HCC)  Class 1 obesity with serious comorbidity and body mass index (BMI) of 30.0 to 30.9 in adult, unspecified obesity type  PLAN:  Non insulin dependant Diabetes II without complications Zlata has been given extensive diabetes education by myself today including ideal fasting and post-prandial blood glucose readings, individual ideal Hgb A1c goals and hypoglycemia prevention. We discussed  the importance of good blood sugar control to decrease the likelihood of diabetic complications such as nephropathy, neuropathy, limb loss, blindness, coronary artery  disease, and death. We discussed the importance of intensive lifestyle modification including diet, exercise and weight loss as the first line treatment for diabetes. Stephonie will continue to take metformin half tablet every morning and it is okay to add two 1/2 tablets in the afternoon. Lacey agreed to  follow up at the agreed upon time.  I spent > than 50% of the 15 minute visit on counseling as documented in the note.  Obesity Arsenia is currently in the action stage of change. As such, her goal is to continue with weight loss efforts She has agreed to follow the Category 2 plan Princella has been instructed to work up to a goal of 150 minutes of combined cardio and strengthening exercise per week for weight loss and overall health benefits. We discussed the following Behavioral Modification Strategies today: increase H2O intake, increasing lean protein intake, increasing vegetables, work on meal planning and easy cooking plans, holiday eating strategies  and emotional eating strategies  Desha has agreed to follow up with our clinic in 2 weeks. She was informed of the importance of frequent follow up visits to maximize her success with intensive lifestyle modifications for her multiple health conditions.   OBESITY BEHAVIORAL INTERVENTION VISIT  Today's visit was # 4   Starting weight: 171 lbs Starting date: 02/06/18 Today's weight : 161 lbs Today's date: 04/01/2018 Total lbs lost to date: 10 At least 15 minutes were spent on discussing the following behavioral intervention visit.   ASK: We discussed the diagnosis of obesity with [name] today and Halee agreed to give [name] permission to discuss obesity behavioral modification therapy today.  ASSESS: Lamya has the diagnosis of obesity and her BMI today is 30.44 Deneshia is in the action stage of change   ADVISE: Dollye was educated on the multiple health risks of obesity as well as the benefit of weight loss to improve her health. She was advised of  the need for long term treatment and the importance of lifestyle modifications to improve her current health and to decrease her risk of future health problems.  AGREE: Multiple dietary modification options and treatment options were discussed and  Irean agreed to follow the recommendations documented in the above note.  ARRANGE: Ora was educated on the importance of frequent visits to treat obesity as outlined per CMS and USPSTF guidelines and agreed to schedule her next follow up appointment today.  I, Doreene Nest, am acting as transcriptionist for Dennard Nip, MD  I have reviewed the above documentation for accuracy and completeness, and I agree with the above. -Dennard Nip, MD

## 2018-04-06 ENCOUNTER — Other Ambulatory Visit: Payer: Self-pay | Admitting: Internal Medicine

## 2018-04-16 ENCOUNTER — Other Ambulatory Visit: Payer: Self-pay | Admitting: Family

## 2018-04-16 MED ORDER — ALBUTEROL SULFATE HFA 108 (90 BASE) MCG/ACT IN AERS: 2.0000 | INHALATION_SPRAY | Freq: Four times a day (QID) | RESPIRATORY_TRACT | 0 refills | Status: DC | PRN

## 2018-04-18 ENCOUNTER — Encounter (INDEPENDENT_AMBULATORY_CARE_PROVIDER_SITE_OTHER): Payer: Self-pay | Admitting: Family Medicine

## 2018-04-18 ENCOUNTER — Ambulatory Visit (INDEPENDENT_AMBULATORY_CARE_PROVIDER_SITE_OTHER): Payer: Managed Care, Other (non HMO) | Admitting: Family Medicine

## 2018-04-18 VITALS — BP 122/72 | HR 63 | Temp 97.8°F | Ht 61.0 in | Wt 162.0 lb

## 2018-04-18 DIAGNOSIS — E119 Type 2 diabetes mellitus without complications: Secondary | ICD-10-CM | POA: Diagnosis not present

## 2018-04-18 DIAGNOSIS — E669 Obesity, unspecified: Secondary | ICD-10-CM

## 2018-04-18 DIAGNOSIS — E559 Vitamin D deficiency, unspecified: Secondary | ICD-10-CM | POA: Diagnosis not present

## 2018-04-18 DIAGNOSIS — Z9189 Other specified personal risk factors, not elsewhere classified: Secondary | ICD-10-CM | POA: Diagnosis not present

## 2018-04-18 DIAGNOSIS — Z6830 Body mass index (BMI) 30.0-30.9, adult: Secondary | ICD-10-CM

## 2018-04-18 MED ORDER — VITAMIN D (ERGOCALCIFEROL) 1.25 MG (50000 UNIT) PO CAPS: 50000.0000 [IU] | ORAL_CAPSULE | ORAL | 0 refills | Status: DC

## 2018-04-23 NOTE — Progress Notes (Signed)
Office: [phone]  /  Fax: [phone]   HPI:   Chief Complaint: OBESITY Amy Lang is here to discuss her progress with her obesity treatment plan. She is following the Category 2 plan and is following her eating plan approximately 85 % of the time. She states she is walking 30 minutes 3 times per week. Amy Lang is retaining fluid today. She said that she has had increased temptations from clients. She is trying to come up with strategies to help decrease holiday eating.  Her weight is 162 lb (73.5 kg) today and gained 1 lb since her last visit. She has lost 10 lbs since starting treatment with [name].  Vitamin D deficiency Amy Lang has a diagnosis of vitamin D deficiency. She is currently stable taking prescription Vit D and denies nausea, vomiting or muscle weakness.  At risk for osteopenia and osteoporosis Amy Lang is at higher risk of osteopenia and osteoporosis due to vitamin D deficiency.   Diabetes II Amy Lang has a diagnosis of diabetes type II. Amy Lang states she has been forgetting to take her 2nd dose of Metformin. Last A1c was Hemoglobin A1C Latest Ref Rng & Units 02/06/2018 11/19/2017  HGBA1C 4.8 - 5.6 % 6.0(H) 6.8(H)  Some recent data might be hidden    She has been working on intensive lifestyle modifications including diet, exercise, and weight loss to help control her blood glucose levels. She notes increased polyphagia. She denies keeping a blood sugar log.     ALLERGIES: Allergies  Allergen Reactions  . Bee Pollen Anaphylaxis  . Butorphanol   . Butorphanol Tartrate Other (See Comments)    REACTION: hallucinations ( stadol)  . Codeine   . Codeine Sulfate Hives and Nausea And Vomiting  . Crestor [Rosuvastatin] Other (See Comments)    Body aches  . Darvocet [Propoxyphene N-Acetaminophen] Hives and Nausea And Vomiting  . Estrogens   . Levofloxacin Other (See Comments)    abdominal pain  . Metformin And Related     diarrhea  . Penicillamine   . Penicillins Other (See Comments)   Seizure   . Sulfa Antibiotics Hives and Nausea And Vomiting  . Latex Rash    Blisters and redness with bandaids-also sensitivity to latex gloves    MEDICATIONS: Current Outpatient Medications on File Prior to Visit  Medication Sig Dispense Refill  . albuterol (PROVENTIL HFA;VENTOLIN HFA) 108 (90 Base) MCG/ACT inhaler Inhale 2 puffs into the lungs every 6 (six) hours as needed for wheezing or shortness of breath. 1 Inhaler 0  . aspirin EC 81 MG tablet Take 81 mg by mouth every evening.     . cetirizine (ZYRTEC) 10 MG tablet Take 10 mg by mouth daily.     . cyanocobalamin (,VITAMIN B-12,) 1000 MCG/ML injection 1 ml IM or SQ every 2 weeks (lifetime) 10 mL 5  . Ertugliflozin L-PyroglutamicAc (STEGLATRO) 5 MG TABS Take 5 mg by mouth daily. 30 tablet 11  . FLUoxetine (PROZAC) 20 MG tablet TAKE 1 TABLET EVERY DAY 30 tablet 5  . fluticasone (FLONASE) 50 MCG/ACT nasal spray Place 2 sprays into both nostrils daily. 16 g 6  . losartan (COZAAR) 100 MG tablet Take 1 tablet (100 mg total) by mouth daily. 90 tablet 3  . metFORMIN (GLUCOPHAGE) 500 MG tablet Take 1 tablet (500 mg total) by mouth daily with breakfast. (Patient taking differently: Take 250 mg by mouth daily with breakfast. ) 30 tablet 0  . Multiple Vitamin (MULTIVITAMIN WITH MINERALS) TABS Take 1 tablet by mouth daily.    [name]  Multiple Vitamins-Minerals (PRESERVISION AREDS 2) CAPS Take 1 capsule by mouth at bedtime.    . Omega-3 Fatty Acids (FISH OIL) 1000 MG CAPS Take 1,000 mg by mouth daily.    . pantoprazole (PROTONIX) 20 MG tablet Take 1 tablet (20 mg total) by mouth daily before breakfast. 90 tablet 3  . Probiotic Product (ALIGN) 4 MG CAPS Take 1 capsule by mouth daily. 30 capsule 1  . topiramate (TOPAMAX) 200 MG tablet Take 1 tablet (200 mg total) by mouth daily. Must keep OV in March to get refills 90 tablet 3  . vitamin C (ASCORBIC ACID) 500 MG tablet Take 500 mg by mouth daily.     . vitamin E 1000 UNIT capsule Take 1,000 Units by mouth  daily.     No current facility-administered medications on file prior to visit.     PAST MEDICAL HISTORY: Past Medical History:  Diagnosis Date  . Allergy   . Anxiety   . Asthma   . Depression   . Diabetes ([place])   . Dyslipidemia   . Hypertension   . IBS (irritable bowel syndrome)   . Lactose intolerance   . Migraines   . Obesity   . PONV (postoperative nausea and vomiting)   . Shortness of breath   . 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 Vitamin B 12 deficiency   . Vitamin D deficiency     PAST SURGICAL HISTORY: Past Surgical History:  Procedure Laterality Date  . ABDOMINAL HYSTERECTOMY     still has bilateral ovaries  . BREAST LUMPECTOMY WITH RADIOACTIVE SEED LOCALIZATION Right 08/28/2017   Procedure: BREAST LUMPECTOMY WITH RADIOACTIVE SEED LOCALIZATION;  Surgeon: Erroll Luna, MD;  Location: [facility];  Service: General;  Laterality: Right;  . CHOLECYSTECTOMY  04-04-11   single site  . COLONOSCOPY W/ BIOPSIES  01/24/2013  . ESOPHAGOGASTRODUODENOSCOPY    . KNEE SURGERY    . LAPAROTOMY  05/12/2012   Procedure: LAPAROTOMY;  Surgeon: Cyril Mourning, MD;  Location: [facility];  Service: Gynecology;;  laparotomy [name] salpingectomy-oopharectomy  . NM MYOCAR PERF WALL MOTION  09/06/2009   normal  . TRANSTHORACIC ECHOCARDIOGRAM  March 2014   Normal regional wall motion. EF 55%. Mild MR    SOCIAL HISTORY: Social History   Tobacco Use  . Smoking status: Never Smoker  . Smokeless tobacco: Never Used  . Tobacco comment: Regular Exercise - No  Substance Use Topics  . Alcohol use: No  . Drug use: No    FAMILY HISTORY: Family History  Problem Relation Age of Onset  . Heart disease Mother   . COPD Mother   . Heart attack Mother 19  . Diabetes Mother   . Hypertension Mother   . Hyperlipidemia Mother   . Kidney disease Mother   . Thyroid disease Mother   . Lung cancer Father   . Hypertension Father   . Hyperlipidemia Father   . Heart disease Maternal  Grandmother   . Breast cancer Maternal Grandmother 2  . Heart disease Maternal Grandfather   . Heart disease Paternal Grandmother   . Heart disease Paternal Grandfather   . Colon cancer Neg Hx   . Esophageal cancer Neg Hx   . Rectal cancer Neg Hx   . Stomach cancer Neg Hx     ROS: Review of Systems  Constitutional: Negative for weight loss.  Gastrointestinal: Negative for nausea and vomiting.       Positive for polyphagia  Musculoskeletal:  Negative for muscle weakness    PHYSICAL EXAM: Blood pressure 122/72, pulse 63, temperature 97.8 F (36.6 C), temperature source Oral, height 5\' 1"  (1.549 m), weight 162 lb (73.5 kg), SpO2 99 %. Body mass index is 30.61 kg/m. Physical Exam  Constitutional: She is oriented to person, place, and time. She appears well-developed and well-nourished.  Cardiovascular: Normal rate.  Pulmonary/Chest: Effort normal.  Musculoskeletal: Normal range of motion.  Neurological: She is alert and oriented to person, place, and time.  Skin: Skin is warm and dry.  Psychiatric: She has a normal mood and affect. Her behavior is normal.  Vitals reviewed.   RECENT LABS AND TESTS: BMET    Component Value Date/Time   NA 143 02/06/2018 1246   K 4.4 02/06/2018 1246   CL 106 02/06/2018 1246   CO2 19 (L) 02/06/2018 1246   GLUCOSE 84 02/06/2018 1246   GLUCOSE 126 (H) 11/19/2017 1051   BUN 19 02/06/2018 1246   CREATININE 0.72 02/06/2018 1246   CALCIUM 8.9 02/06/2018 1246   GFRNONAA 95 02/06/2018 1246   GFRAA 110 02/06/2018 1246   Lab Results  Component Value Date   HGBA1C 6.0 (H) 02/06/2018   HGBA1C 6.8 (H) 11/19/2017   Lab Results  Component Value Date   INSULIN 18.3 02/06/2018   CBC    Component Value Date/Time   WBC 5.7 02/06/2018 1246   WBC 5.9 10/01/2017 0938   RBC 4.58 02/06/2018 1246   RBC 4.77 10/01/2017 0938   HGB 13.8 02/06/2018 1246   HCT 42.8 02/06/2018 1246   PLT 187.0 10/01/2017 0938   PLT WILL FOLLOW 09/12/2016 0832    MCV 93 02/06/2018 1246   MCH 30.1 02/06/2018 1246   MCH 30.5 05/13/2012 0500   MCHC 32.2 02/06/2018 1246   MCHC 33.8 10/01/2017 0938   RDW 13.8 02/06/2018 1246   LYMPHSABS 2.0 02/06/2018 1246   MONOABS 0.5 10/01/2017 0938   EOSABS 0.0 02/06/2018 1246   BASOSABS 0.0 02/06/2018 1246   Iron/TIBC/Ferritin/ %Sat No results found for: IRON, TIBC, FERRITIN, IRONPCTSAT Lipid Panel     Component Value Date/Time   CHOL 168 02/06/2018 1246   TRIG 144 02/06/2018 1246   HDL 48 02/06/2018 1246   CHOLHDL 4 11/19/2017 1051   VLDL 38.8 11/19/2017 1051   LDLCALC 91 02/06/2018 1246   LDLDIRECT 96.0 10/01/2017 0938   Hepatic Function Panel     Component Value Date/Time   PROT 6.8 02/06/2018 1246   ALBUMIN 4.4 02/06/2018 1246   AST 23 02/06/2018 1246   ALT 30 02/06/2018 1246   ALKPHOS 82 02/06/2018 1246   BILITOT 0.3 02/06/2018 1246   BILIDIR 0.1 10/01/2017 0938   BILIDIR 0.10 08/14/2016 1135   IBILI NOT CALCULATED 04/03/2011 0558      Component Value Date/Time   TSH 2.030 02/06/2018 1246   TSH 2.24 10/01/2017 0938   TSH 1.90 09/11/2016 1406  Results for TRYSTYN, SITTS (MRN [id number]) as of 04/23/2018 13:18  Ref. Range 02/06/2018 12:46  Vitamin D, 25-Hydroxy Latest Ref Range: 30.0 - 100.0 ng/mL 37.6    ASSESSMENT AND PLAN: Vitamin D deficiency - Plan: Vitamin D, Ergocalciferol, (DRISDOL) 1.25 MG (50000 UT) CAPS capsule  Type 2 diabetes mellitus without complication, without long-term current use of insulin (HCC)  At risk for osteoporosis  Class 1 obesity with serious comorbidity and body mass index (BMI) of 30.0 to 30.9 in adult, unspecified obesity type  PLAN: Vitamin D Deficiency Amy Lang was informed that low vitamin D levels contributes  to fatigue and are associated with obesity, breast, and colon cancer. She agrees to continue taking prescription Vit D @50 ,000 IU every week #4 with no refills. She will follow up for routine testing of vitamin D, at least 2-3 times per year. She  was informed of the risk of over-replacement of vitamin D and agrees to not increase her dose unless she discusses this with [name] first. Amy Lang agrees to follow up with our office in 3 weeks.   At risk for osteopenia and osteoporosis Amy Lang was given extended  (15 minutes) osteoporosis prevention counseling today. Amy Lang is at risk for osteopenia and osteoporsis due to her vitamin D deficiency. She was encouraged to take her vitamin D and follow her higher calcium diet and increase strengthening exercise to help strengthen her bones and decrease her risk of osteopenia and osteoporosis. Amy Lang agrees to follow up with our office in 3 weeks.   Diabetes II Amy Lang has been given extensive diabetes education by myself today including ideal fasting and post-prandial blood glucose readings, individual ideal HgA1c goals  and hypoglycemia prevention. We discussed the importance of good blood sugar control to decrease the likelihood of diabetic complications such as nephropathy, neuropathy, limb loss, blindness, coronary artery disease, and death. We discussed the importance of intensive lifestyle modification including diet, exercise and weight loss as the first line treatment for diabetes. Chamia has been encouraged to take the 2nd dose of Metformin. Katalyna agrees to follow up with our office in 3 weeks.   Obesity Amy Lang is currently in the action stage of change. As such, her goal is to continue with weight loss efforts She has agreed to follow the Category 2 plan Amy Lang has been instructed to work up to a goal of 150 minutes of combined cardio and strengthening exercise per week for weight loss and overall health benefits. We discussed the following Behavioral Modification Strategies today: increasing lean protein intake, celebration eating strategies,  decreasing simple carbohydrates , work on meal planning and easy cooking plans and holiday eating strategies   Amy Lang has agreed to follow up with our clinic in 3 weeks. She was  informed of the importance of frequent follow up visits to maximize her success with intensive lifestyle modifications for her multiple health conditions.   OBESITY BEHAVIORAL INTERVENTION VISIT  Today's visit was # 5   Starting weight: 171 lbs Starting date: 02/06/2018 Today's weight : Weight: 162 lb (73.5 kg)  Today's date: 04/18/2018 Total lbs lost to date: 10 At least 15 minutes were spent on discussing the following behavioral intervention visit.   ASK: We discussed the diagnosis of obesity with Amy Lang today and Amy Lang agreed to give [name] permission to discuss obesity behavioral modification therapy today.  ASSESS: Amy Lang has the diagnosis of obesity and her BMI today is 30.63 Amy Lang is in the action stage of change   ADVISE: Amy Lang was educated on the multiple health risks of obesity as well as the benefit of weight loss to improve her health. She was advised of the need for long term treatment and the importance of lifestyle modifications to improve her current health and to decrease her risk of future health problems.  AGREE: Multiple dietary modification options and treatment options were discussed and  Amy Lang agreed to follow the recommendations documented in the above note.  ARRANGE: Amy Lang was educated on the importance of frequent visits to treat obesity as outlined per CMS and USPSTF guidelines and agreed to schedule her next follow up appointment today.  I, Remi Deter, CMA, am acting as transcriptionist for Dennard Nip, MD.  I have reviewed the above documentation for accuracy and completeness, and I agree with the above. -Dennard Nip, MD

## 2018-04-30 ENCOUNTER — Encounter (INDEPENDENT_AMBULATORY_CARE_PROVIDER_SITE_OTHER): Payer: Self-pay | Admitting: Family Medicine

## 2018-05-07 ENCOUNTER — Other Ambulatory Visit: Payer: Self-pay | Admitting: Family

## 2018-05-13 ENCOUNTER — Ambulatory Visit (INDEPENDENT_AMBULATORY_CARE_PROVIDER_SITE_OTHER): Payer: Managed Care, Other (non HMO) | Admitting: Family Medicine

## 2018-05-13 ENCOUNTER — Encounter (INDEPENDENT_AMBULATORY_CARE_PROVIDER_SITE_OTHER): Payer: Self-pay | Admitting: Family Medicine

## 2018-05-13 ENCOUNTER — Ambulatory Visit (INDEPENDENT_AMBULATORY_CARE_PROVIDER_SITE_OTHER): Payer: Self-pay | Admitting: Family Medicine

## 2018-05-13 VITALS — BP 122/73 | HR 59 | Temp 98.2°F | Ht 61.0 in | Wt 158.0 lb

## 2018-05-13 DIAGNOSIS — Z6830 Body mass index (BMI) 30.0-30.9, adult: Secondary | ICD-10-CM

## 2018-05-13 DIAGNOSIS — E119 Type 2 diabetes mellitus without complications: Secondary | ICD-10-CM

## 2018-05-13 DIAGNOSIS — E559 Vitamin D deficiency, unspecified: Secondary | ICD-10-CM

## 2018-05-13 DIAGNOSIS — E669 Obesity, unspecified: Secondary | ICD-10-CM

## 2018-05-13 DIAGNOSIS — Z9189 Other specified personal risk factors, not elsewhere classified: Secondary | ICD-10-CM | POA: Diagnosis not present

## 2018-05-13 DIAGNOSIS — E66811 Obesity, class 1: Secondary | ICD-10-CM

## 2018-05-13 MED ORDER — VITAMIN D (ERGOCALCIFEROL) 1.25 MG (50000 UNIT) PO CAPS: 50000.0000 [IU] | ORAL_CAPSULE | ORAL | 0 refills | Status: DC

## 2018-05-13 NOTE — Progress Notes (Signed)
Office: [phone]  /  Fax: [phone]   HPI:   Chief Complaint: OBESITY Amy Lang is here to discuss her progress with her obesity treatment plan. She is on the Category 2 plan and is following her eating plan approximately 85 % of the time. She states she is walking 35 to 45 minutes 3 times per week. Amy Lang continues to do well with weight loss. She has done better with avoiding temptations, but she is getting a bit bored.  Her weight is 158 lb (71.7 kg) today and has had a weight loss of 4 pounds over a period of 3 weeks since her last visit. She has lost 13 lbs since starting treatment with [name].  Vitamin D deficiency Amy Lang has a diagnosis of vitamin D deficiency. She is currently stable on vit D, but is not yet at goal. She denies nausea, vomiting, or muscle weakness.  Diabetes II Amy Lang has a diagnosis of diabetes type II. Amy Lang is stable on metformin 500mg  and her diet. She denies any nausea, vomiting, or hypoglycemia. Last A1c was 6.0 on 02/06/18. She has been working on intensive lifestyle modifications including diet, exercise, and weight loss to help control her blood glucose levels.  At risk for cardiovascular disease Amy Lang is at a higher than average risk for cardiovascular disease due to diabetes and obesity. She currently denies any chest pain.  ASSESSMENT AND PLAN:  Vitamin D deficiency - Plan: VITAMIN D 25 Hydroxy (Vit-D Deficiency, Fractures), Vitamin D, Ergocalciferol, (DRISDOL) 1.25 MG (50000 UT) CAPS capsule  Type 2 diabetes mellitus without complication, without long-term current use of insulin (HCC) - Plan: Hemoglobin A1c, Insulin, random, Comprehensive metabolic panel  At risk for heart disease  Class 1 obesity with serious comorbidity and body mass index (BMI) of 30.0 to 30.9 in adult, unspecified obesity type - Starting BMI greater then 30  PLAN:  Vitamin D Deficiency Amy Lang was informed that low vitamin D levels contributes to fatigue and are associated with obesity,  breast, and colon cancer. She agrees to continue to take prescription Vit D @50 ,000 IU every week #4 with no refills and will follow up for routine testing of vitamin D, at least 2-3 times per year. She was informed of the risk of over-replacement of vitamin D and agrees to not increase her dose unless she discusses this with [name] first. Amy Lang agrees to follow up in 3 weeks.  Diabetes II Amy Lang has been given extensive diabetes education by myself today including ideal fasting and post-prandial blood glucose readings, individual ideal Hg A1c goals, and hypoglycemia prevention. We discussed the importance of good blood sugar control to decrease the likelihood of diabetic complications such as nephropathy, neuropathy, limb loss, blindness, coronary artery disease, and death. We discussed the importance of intensive lifestyle modification including diet, exercise and weight loss as the first line treatment for diabetes. Amy Lang agrees to continue her metformin and diet. We will check labs today. She will follow up at the agreed upon time.  Cardiovascular risk counseling Amy Lang was given extended (15 minutes) coronary artery disease prevention counseling today. She is 52 y.o. female and has risk factors for heart disease including diabetes and obesity. We discussed intensive lifestyle modifications today with an emphasis on specific weight loss instructions and strategies. Pt was also informed of the importance of increasing exercise and decreasing saturated fats to help prevent heart disease.  Obesity Amy Lang is currently in the action stage of change. As such, her goal is to continue with weight loss efforts. She  has agreed to keep a food journal with 1200 to 1300 calories and 75+ grams of protein.  Amy Lang has been instructed to work up to a goal of 150 minutes of combined cardio and strengthening exercise per week for weight loss and overall health benefits. We discussed the following Behavioral Modification Strategies  today: increasing lean protein intake, decreasing simple carbohydrates, work on meal planning and easy cooking plans, holiday eating strategies, travel eating strategies, and keep a strict food journal.  Amy Lang has agreed to follow up with our clinic in 3 weeks. She was informed of the importance of frequent follow up visits to maximize her success with intensive lifestyle modifications for her multiple health conditions.  ALLERGIES: Allergies  Allergen Reactions  . Bee Pollen Anaphylaxis  . Butorphanol   . Butorphanol Tartrate Other (See Comments)    REACTION: hallucinations ( stadol)  . Codeine   . Codeine Sulfate Hives and Nausea And Vomiting  . Crestor [Rosuvastatin] Other (See Comments)    Body aches  . Darvocet [Propoxyphene N-Acetaminophen] Hives and Nausea And Vomiting  . Estrogens   . Levofloxacin Other (See Comments)    abdominal pain  . Metformin And Related     diarrhea  . Penicillamine   . Penicillins Other (See Comments)    Seizure   . Sulfa Antibiotics Hives and Nausea And Vomiting  . Latex Rash    Blisters and redness with bandaids-also sensitivity to latex gloves    MEDICATIONS: Current Outpatient Medications on File Prior to Visit  Medication Sig Dispense Refill  . aspirin EC 81 MG tablet Take 81 mg by mouth every evening.     . cetirizine (ZYRTEC) 10 MG tablet Take 10 mg by mouth daily.     . cyanocobalamin (,VITAMIN B-12,) 1000 MCG/ML injection 1 ml IM or SQ every 2 weeks (lifetime) 10 mL 5  . Ertugliflozin L-PyroglutamicAc (STEGLATRO) 5 MG TABS Take 5 mg by mouth daily. 30 tablet 11  . FLUoxetine (PROZAC) 20 MG tablet TAKE 1 TABLET EVERY DAY 30 tablet 5  . fluticasone (FLONASE) 50 MCG/ACT nasal spray Place 2 sprays into both nostrils daily. 16 g 6  . losartan (COZAAR) 100 MG tablet Take 1 tablet (100 mg total) by mouth daily. 90 tablet 3  . metFORMIN (GLUCOPHAGE) 500 MG tablet Take 1 tablet (500 mg total) by mouth daily with breakfast. (Patient taking  differently: Take 250 mg by mouth daily with breakfast. ) 30 tablet 0  . Multiple Vitamin (MULTIVITAMIN WITH MINERALS) TABS Take 1 tablet by mouth daily.    . Multiple Vitamins-Minerals (PRESERVISION AREDS 2) CAPS Take 1 capsule by mouth at bedtime.    . Omega-3 Fatty Acids (FISH OIL) 1000 MG CAPS Take 1,000 mg by mouth daily.    . pantoprazole (PROTONIX) 20 MG tablet Take 1 tablet (20 mg total) by mouth daily before breakfast. 90 tablet 3  . Probiotic Product (ALIGN) 4 MG CAPS Take 1 capsule by mouth daily. 30 capsule 1  . topiramate (TOPAMAX) 200 MG tablet Take 1 tablet (200 mg total) by mouth daily. Must keep OV in March to get refills 90 tablet 3  . VENTOLIN HFA 108 (90 Base) MCG/ACT inhaler TAKE 2 PUFFS BY MOUTH EVERY 6 HOURS AS NEEDED FOR WHEEZE OR SHORTNESS OF BREATH 18 Inhaler 0  . vitamin C (ASCORBIC ACID) 500 MG tablet Take 500 mg by mouth daily.     . vitamin E 1000 UNIT capsule Take 1,000 Units by mouth daily.  No current facility-administered medications on file prior to visit.     PAST MEDICAL HISTORY: Past Medical History:  Diagnosis Date  . Allergy   . Anxiety   . Asthma   . Depression   . Diabetes ([place])   . Dyslipidemia   . Hypertension   . IBS (irritable bowel syndrome)   . Lactose intolerance   . Migraines   . Obesity   . PONV (postoperative nausea and vomiting)   . Shortness of breath   . Vaginal cancer ([place])   . Vitamin B 12 deficiency   . Vitamin D deficiency     PAST SURGICAL HISTORY: Past Surgical History:  Procedure Laterality Date  . ABDOMINAL HYSTERECTOMY     still has bilateral ovaries  . BREAST LUMPECTOMY WITH RADIOACTIVE SEED LOCALIZATION Right 08/28/2017   Procedure: BREAST LUMPECTOMY WITH RADIOACTIVE SEED LOCALIZATION;  Surgeon: Erroll Luna, MD;  Location: [facility];  Service: General;  Laterality: Right;  . CHOLECYSTECTOMY  04-04-11   single site  . COLONOSCOPY W/ BIOPSIES  01/24/2013  . ESOPHAGOGASTRODUODENOSCOPY    .  KNEE SURGERY    . LAPAROTOMY  05/12/2012   Procedure: LAPAROTOMY;  Surgeon: Cyril Mourning, MD;  Location: [facility];  Service: Gynecology;;  laparotomy [name] salpingectomy-oopharectomy  . NM MYOCAR PERF WALL MOTION  09/06/2009   normal  . TRANSTHORACIC ECHOCARDIOGRAM  March 2014   Normal regional wall motion. EF 55%. Mild MR    SOCIAL HISTORY: Social History   Tobacco Use  . Smoking status: Never Smoker  . Smokeless tobacco: Never Used  . Tobacco comment: Regular Exercise - No  Substance Use Topics  . Alcohol use: No  . Drug use: No    FAMILY HISTORY: Family History  Problem Relation Age of Onset  . Heart disease Mother   . COPD Mother   . Heart attack Mother 47  . Diabetes Mother   . Hypertension Mother   . Hyperlipidemia Mother   . Kidney disease Mother   . Thyroid disease Mother   . Lung cancer Father   . Hypertension Father   . Hyperlipidemia Father   . Heart disease Maternal Grandmother   . Breast cancer Maternal Grandmother 75  . Heart disease Maternal Grandfather   . Heart disease Paternal Grandmother   . Heart disease Paternal Grandfather   . Colon cancer Neg Hx   . Esophageal cancer Neg Hx   . Rectal cancer Neg Hx   . Stomach cancer Neg Hx    ROS: Review of Systems  Constitutional: Positive for weight loss.  Cardiovascular: Negative for chest pain.  Gastrointestinal: Negative for nausea and vomiting.  Musculoskeletal:       Negative for muscle weakness.  Endo/Heme/Allergies:       Negative for hypoglycemia.   PHYSICAL EXAM: Blood pressure 122/73, pulse (!) 59, temperature 98.2 F (36.8 C), temperature source Oral, height 5\' 1"  (1.549 m), weight 158 lb (71.7 kg), SpO2 100 %. Body mass index is 29.85 kg/m. Physical Exam Vitals signs reviewed.  Constitutional:      Appearance: Normal appearance. She is obese.  Cardiovascular:     Rate and Rhythm: Normal rate.  Pulmonary:     Effort: Pulmonary effort is normal.  Musculoskeletal: Normal  range of motion.  Skin:    General: Skin is warm and dry.  Neurological:     Mental Status: She is alert and oriented to person, place, and time.  Psychiatric:        Mood  and Affect: Mood normal.        Behavior: Behavior normal.    RECENT LABS AND TESTS: BMET    Component Value Date/Time   NA 143 02/06/2018 1246   K 4.4 02/06/2018 1246   CL 106 02/06/2018 1246   CO2 19 (L) 02/06/2018 1246   GLUCOSE 84 02/06/2018 1246   GLUCOSE 126 (H) 11/19/2017 1051   BUN 19 02/06/2018 1246   CREATININE 0.72 02/06/2018 1246   CALCIUM 8.9 02/06/2018 1246   GFRNONAA 95 02/06/2018 1246   GFRAA 110 02/06/2018 1246   Lab Results  Component Value Date   HGBA1C 6.0 (H) 02/06/2018   HGBA1C 6.8 (H) 11/19/2017   Lab Results  Component Value Date   INSULIN 18.3 02/06/2018   CBC    Component Value Date/Time   WBC 5.7 02/06/2018 1246   WBC 5.9 10/01/2017 0938   RBC 4.58 02/06/2018 1246   RBC 4.77 10/01/2017 0938   HGB 13.8 02/06/2018 1246   HCT 42.8 02/06/2018 1246   PLT 187.0 10/01/2017 0938   PLT WILL FOLLOW 09/12/2016 0832   MCV 93 02/06/2018 1246   MCH 30.1 02/06/2018 1246   MCH 30.5 05/13/2012 0500   MCHC 32.2 02/06/2018 1246   MCHC 33.8 10/01/2017 0938   RDW 13.8 02/06/2018 1246   LYMPHSABS 2.0 02/06/2018 1246   MONOABS 0.5 10/01/2017 0938   EOSABS 0.0 02/06/2018 1246   BASOSABS 0.0 02/06/2018 1246   Iron/TIBC/Ferritin/ %Sat No results found for: IRON, TIBC, FERRITIN, IRONPCTSAT Lipid Panel     Component Value Date/Time   CHOL 168 02/06/2018 1246   TRIG 144 02/06/2018 1246   HDL 48 02/06/2018 1246   CHOLHDL 4 11/19/2017 1051   VLDL 38.8 11/19/2017 1051   LDLCALC 91 02/06/2018 1246   LDLDIRECT 96.0 10/01/2017 0938   Hepatic Function Panel     Component Value Date/Time   PROT 6.8 02/06/2018 1246   ALBUMIN 4.4 02/06/2018 1246   AST 23 02/06/2018 1246   ALT 30 02/06/2018 1246   ALKPHOS 82 02/06/2018 1246   BILITOT 0.3 02/06/2018 1246   BILIDIR 0.1 10/01/2017 0938    BILIDIR 0.10 08/14/2016 1135   IBILI NOT CALCULATED 04/03/2011 0558      Component Value Date/Time   TSH 2.030 02/06/2018 1246   TSH 2.24 10/01/2017 0938   TSH 1.90 09/11/2016 1406   Results for CATIE, CHIAO (MRN [id number]) as of 05/13/2018 14:59  Ref. Range 02/06/2018 12:46  Vitamin D, 25-Hydroxy Latest Ref Range: 30.0 - 100.0 ng/mL 37.6   OBESITY BEHAVIORAL INTERVENTION VISIT  Today's visit was # 6   Starting weight: 171 lbs Starting date: 02/06/18 Today's weight : Weight: 158 lb (71.7 kg)  Today's date: 05/13/2018 Total lbs lost to date: 13  ASK: We discussed the diagnosis of obesity with [name] today and Kree agreed to give [name] permission to discuss obesity behavioral modification therapy today.  ASSESS: Myesha has the diagnosis of obesity and her BMI today is 29.8. Linetta is in the action stage of change.   ADVISE: Saraann was educated on the multiple health risks of obesity as well as the benefit of weight loss to improve her health. She was advised of the need for long term treatment and the importance of lifestyle modifications to improve her current health and to decrease her risk of future health problems.  AGREE: Multiple dietary modification options and treatment options were discussed and Analaura agreed to follow the recommendations documented in the above note.  ARRANGE: Sylva was educated on the importance of frequent visits to treat obesity as outlined per CMS and USPSTF guidelines and agreed to schedule her next follow up appointment today.  I, Marcille Blanco, am acting as transcriptionist for Starlyn Skeans, MD  I have reviewed the above documentation for accuracy and completeness, and I agree with the above. -Dennard Nip, MD

## 2018-05-14 LAB — COMPREHENSIVE METABOLIC PANEL
ALT: 30 IU/L (ref 0–32)
AST: 25 IU/L (ref 0–40)
Albumin/Globulin Ratio: 1.8 (ref 1.2–2.2)
Albumin: 4.6 g/dL (ref 3.5–5.5)
Alkaline Phosphatase: 85 IU/L (ref 39–117)
BUN / CREAT RATIO: 23 (ref 9–23)
BUN: 18 mg/dL (ref 6–24)
Bilirubin Total: 0.3 mg/dL (ref 0.0–1.2)
CO2: 19 mmol/L — ABNORMAL LOW (ref 20–29)
Calcium: 9.2 mg/dL (ref 8.7–10.2)
Chloride: 108 mmol/L — ABNORMAL HIGH (ref 96–106)
Creatinine, Ser: 0.78 mg/dL (ref 0.57–1.00)
GFR calc Af Amer: 100 mL/min/{1.73_m2} (ref >59)
GFR calc non Af Amer: 86 mL/min/{1.73_m2} (ref >59)
GLUCOSE: 89 mg/dL (ref 65–99)
Globulin, Total: 2.6 g/dL (ref 1.5–4.5)
Potassium: 4.4 mmol/L (ref 3.5–5.2)
Sodium: 144 mmol/L (ref 134–144)
Total Protein: 7.2 g/dL (ref 6.0–8.5)

## 2018-05-14 LAB — HEMOGLOBIN A1C
Est. average glucose Bld gHb Est-mCnc: 117 mg/dL
Hgb A1c MFr Bld: 5.7 % — ABNORMAL HIGH (ref 4.8–5.6)

## 2018-05-14 LAB — VITAMIN D 25 HYDROXY (VIT D DEFICIENCY, FRACTURES): VIT D 25 HYDROXY: 53 ng/mL (ref 30.0–100.0)

## 2018-05-14 LAB — INSULIN, RANDOM: INSULIN: 10.7 u[IU]/mL (ref 2.6–24.9)

## 2018-05-18 ENCOUNTER — Other Ambulatory Visit: Payer: Self-pay | Admitting: Internal Medicine

## 2018-05-18 DIAGNOSIS — E559 Vitamin D deficiency, unspecified: Secondary | ICD-10-CM

## 2018-05-28 ENCOUNTER — Other Ambulatory Visit (INDEPENDENT_AMBULATORY_CARE_PROVIDER_SITE_OTHER): Payer: Self-pay | Admitting: Family Medicine

## 2018-05-28 DIAGNOSIS — E559 Vitamin D deficiency, unspecified: Secondary | ICD-10-CM

## 2018-06-04 ENCOUNTER — Other Ambulatory Visit: Payer: Self-pay | Admitting: Internal Medicine

## 2018-06-04 DIAGNOSIS — E119 Type 2 diabetes mellitus without complications: Secondary | ICD-10-CM

## 2018-06-10 ENCOUNTER — Ambulatory Visit (INDEPENDENT_AMBULATORY_CARE_PROVIDER_SITE_OTHER): Payer: Managed Care, Other (non HMO) | Admitting: Family Medicine

## 2018-06-10 ENCOUNTER — Encounter (INDEPENDENT_AMBULATORY_CARE_PROVIDER_SITE_OTHER): Payer: Self-pay | Admitting: Family Medicine

## 2018-06-10 VITALS — BP 118/69 | HR 62 | Temp 97.9°F | Ht 61.0 in | Wt 158.0 lb

## 2018-06-10 DIAGNOSIS — Z6830 Body mass index (BMI) 30.0-30.9, adult: Secondary | ICD-10-CM

## 2018-06-10 DIAGNOSIS — E669 Obesity, unspecified: Secondary | ICD-10-CM | POA: Diagnosis not present

## 2018-06-10 DIAGNOSIS — E11649 Type 2 diabetes mellitus with hypoglycemia without coma: Secondary | ICD-10-CM | POA: Diagnosis not present

## 2018-06-10 NOTE — Progress Notes (Signed)
Office: [phone]  /  Fax: [phone]   HPI:   Chief Complaint: OBESITY Amy Lang is here to discuss her progress with her obesity treatment plan. She is on the Category 2 plan and is following her eating plan approximately 75 to 80 % of the time. She states she is walking and doing strengthening exercises 45 to 60 minutes 4 to 5 times per week. Amy Lang has done well maintaining weight over the holidays. She is not journaling, but eats similar foods most days. Her hunger is controlled.  Her weight is 158 lb (71.7 kg) today and has not lost weight since her last visit. She has lost 13 lbs since starting treatment with [name].  Diabetes II with Hypoglycemia Amy Lang has a diagnosis of diabetes type II. Amy Lang states that she does not check sugars, but has noted an increase in this feelings of hypoglycemia, lightheadedness, and shakiness. She is on Steglatro 5mg  and metformin 500mg . Her last A1c was 5.7 on 05/13/18. She has been working on intensive lifestyle modifications including diet, exercise, and weight loss to help control her blood glucose levels.  ASSESSMENT AND PLAN:  Type 2 diabetes mellitus with hypoglycemia without coma, without long-term current use of insulin (HCC)  Class 1 obesity with serious comorbidity and body mass index (BMI) of 30.0 to 30.9 in adult, unspecified obesity type  PLAN:  Diabetes II with Hypoglycemia Amy Lang has been given extensive diabetes education by myself today including ideal fasting and post-prandial blood glucose readings, individual ideal Hgb A1c goals and hypoglycemia prevention. We discussed the importance of good blood sugar control to decrease the likelihood of diabetic complications such as nephropathy, neuropathy, limb loss, blindness, coronary artery disease, and death. We discussed the importance of intensive lifestyle modification including diet, exercise and weight loss as the first line treatment for diabetes. Amy Lang agrees to check her blood sugars when  feeling low and she will let us know if she needs a new meter or strips and to continue her diabetes medications. She will follow up at the agreed upon time in 3 weeks.  I spent > than 50% of the 25 minute visit on counseling as documented in the note.  Obesity Amy Lang is currently in the action stage of change. As such, her goal is to continue with weight loss efforts. She has agreed to follow the Category 2 plan or keep a food journal of 1200 to 1300 calories and 75+ grams of protein. Amy Lang has been instructed to work up to a goal of 150 minutes of combined cardio and strengthening exercise per week for weight loss and overall health benefits. We discussed the following Behavioral Modification Strategies today: increasing lean protein intake, decreasing simple carbohydrates, keep a strict food journal, and work on meal planning and easy cooking plans.  Amy Lang has agreed to follow up with our clinic in 3 weeks. She was informed of the importance of frequent follow up visits to maximize her success with intensive lifestyle modifications for her multiple health conditions.  ALLERGIES: Allergies  Allergen Reactions  . Bee Pollen Anaphylaxis  . Butorphanol   . Butorphanol Tartrate Other (See Comments)    REACTION: hallucinations ( stadol)  . Codeine   . Codeine Sulfate Hives and Nausea And Vomiting  . Crestor [Rosuvastatin] Other (See Comments)    Body aches  . Darvocet [Propoxyphene N-Acetaminophen] Hives and Nausea And Vomiting  . Estrogens   . Levofloxacin Other (See Comments)    abdominal pain  . Metformin And Related  diarrhea  . Penicillamine   . Penicillins Other (See Comments)    Seizure   . Sulfa Antibiotics Hives and Nausea And Vomiting  . Latex Rash    Blisters and redness with bandaids-also sensitivity to latex gloves    MEDICATIONS: Current Outpatient Medications on File Prior to Visit  Medication Sig Dispense Refill  . aspirin EC 81 MG tablet Take 81 mg by mouth  every evening.     . cetirizine (ZYRTEC) 10 MG tablet Take 10 mg by mouth daily.     . cyanocobalamin (,VITAMIN B-12,) 1000 MCG/ML injection 1 ml IM or SQ every 2 weeks (lifetime) 10 mL 5  . Ertugliflozin L-PyroglutamicAc (STEGLATRO) 5 MG TABS Take 5 mg by mouth daily. 30 tablet 11  . FLUoxetine (PROZAC) 20 MG tablet TAKE 1 TABLET EVERY DAY 30 tablet 5  . fluticasone (FLONASE) 50 MCG/ACT nasal spray Place 2 sprays into both nostrils daily. 16 g 6  . losartan (COZAAR) 100 MG tablet Take 1 tablet (100 mg total) by mouth daily. 90 tablet 3  . metFORMIN (GLUCOPHAGE) 500 MG tablet Take 1 tablet (500 mg total) by mouth daily with breakfast. -- Office visit needed for further refills 30 tablet 0  . Multiple Vitamin (MULTIVITAMIN WITH MINERALS) TABS Take 1 tablet by mouth daily.    . Multiple Vitamins-Minerals (PRESERVISION AREDS 2) CAPS Take 1 capsule by mouth at bedtime.    . Omega-3 Fatty Acids (FISH OIL) 1000 MG CAPS Take 1,000 mg by mouth daily.    . pantoprazole (PROTONIX) 20 MG tablet Take 1 tablet (20 mg total) by mouth daily before breakfast. 90 tablet 3  . Probiotic Product (ALIGN) 4 MG CAPS Take 1 capsule by mouth daily. 30 capsule 1  . topiramate (TOPAMAX) 200 MG tablet Take 1 tablet (200 mg total) by mouth daily. Must keep OV in March to get refills 90 tablet 3  . VENTOLIN HFA 108 (90 Base) MCG/ACT inhaler TAKE 2 PUFFS BY MOUTH EVERY 6 HOURS AS NEEDED FOR WHEEZE OR SHORTNESS OF BREATH 18 Inhaler 0  . vitamin C (ASCORBIC ACID) 500 MG tablet Take 500 mg by mouth daily.     . Vitamin D, Ergocalciferol, (DRISDOL) 1.25 MG (50000 UT) CAPS capsule Take 1 capsule (50,000 Units total) by mouth every 3 (three) days. 10 capsule 0  . vitamin E 1000 UNIT capsule Take 1,000 Units by mouth daily.     No current facility-administered medications on file prior to visit.     PAST MEDICAL HISTORY: Past Medical History:  Diagnosis Date  . Allergy   . Anxiety   . Asthma   . Depression   . Diabetes ([place])    . Dyslipidemia   . Hypertension   . IBS (irritable bowel syndrome)   . Lactose intolerance   . Migraines   . Obesity   . PONV (postoperative nausea and vomiting)   . Shortness of breath   . Vaginal cancer ([place])   . Vitamin B 12 deficiency   . Vitamin D deficiency     PAST SURGICAL HISTORY: Past Surgical History:  Procedure Laterality Date  . ABDOMINAL HYSTERECTOMY     still has bilateral ovaries  . BREAST LUMPECTOMY WITH RADIOACTIVE SEED LOCALIZATION Right 08/28/2017   Procedure: BREAST LUMPECTOMY WITH RADIOACTIVE SEED LOCALIZATION;  Surgeon: Erroll Luna, MD;  Location: [facility];  Service: General;  Laterality: Right;  . CHOLECYSTECTOMY  04-04-11   single site  . COLONOSCOPY W/ BIOPSIES  01/24/2013  . ESOPHAGOGASTRODUODENOSCOPY    .  KNEE SURGERY    . LAPAROTOMY  05/12/2012   Procedure: LAPAROTOMY;  Surgeon: Cyril Mourning, MD;  Location: [facility];  Service: Gynecology;;  laparotomy [name] salpingectomy-oopharectomy  . NM MYOCAR PERF WALL MOTION  09/06/2009   normal  . TRANSTHORACIC ECHOCARDIOGRAM  March 2014   Normal regional wall motion. EF 55%. Mild MR    SOCIAL HISTORY: Social History   Tobacco Use  . Smoking status: Never Smoker  . Smokeless tobacco: Never Used  . Tobacco comment: Regular Exercise - No  Substance Use Topics  . Alcohol use: No  . Drug use: No    FAMILY HISTORY: Family History  Problem Relation Age of Onset  . Heart disease Mother   . COPD Mother   . Heart attack Mother 53  . Diabetes Mother   . Hypertension Mother   . Hyperlipidemia Mother   . Kidney disease Mother   . Thyroid disease Mother   . Lung cancer Father   . Hypertension Father   . Hyperlipidemia Father   . Heart disease Maternal Grandmother   . Breast cancer Maternal Grandmother 103  . Heart disease Maternal Grandfather   . Heart disease Paternal Grandmother   . Heart disease Paternal Grandfather   . Colon cancer Neg Hx   . Esophageal cancer Neg Hx    . Rectal cancer Neg Hx   . Stomach cancer Neg Hx     ROS: Review of Systems  Constitutional: Negative for weight loss.  Neurological:       Positive for lightheadedness. Positive for shakiness.  Endo/Heme/Allergies:       Positive for hypoglycemia.    PHYSICAL EXAM: Blood pressure 118/69, pulse 62, temperature 97.9 F (36.6 C), temperature source Oral, height 5\' 1"  (1.549 m), weight 158 lb (71.7 kg), SpO2 100 %. Body mass index is 29.85 kg/m. Physical Exam Vitals signs reviewed.  Constitutional:      Appearance: Normal appearance. She is obese.  Cardiovascular:     Rate and Rhythm: Normal rate.  Pulmonary:     Effort: Pulmonary effort is normal.  Musculoskeletal: Normal range of motion.  Skin:    General: Skin is warm and dry.  Neurological:     Mental Status: She is alert and oriented to person, place, and time.  Psychiatric:        Mood and Affect: Mood normal.        Behavior: Behavior normal.     RECENT LABS AND TESTS: BMET    Component Value Date/Time   NA 144 05/13/2018 0934   K 4.4 05/13/2018 0934   CL 108 (H) 05/13/2018 0934   CO2 19 (L) 05/13/2018 0934   GLUCOSE 89 05/13/2018 0934   GLUCOSE 126 (H) 11/19/2017 1051   BUN 18 05/13/2018 0934   CREATININE 0.78 05/13/2018 0934   CALCIUM 9.2 05/13/2018 0934   GFRNONAA 86 05/13/2018 0934   GFRAA 100 05/13/2018 0934   Lab Results  Component Value Date   HGBA1C 5.7 (H) 05/13/2018   HGBA1C 6.0 (H) 02/06/2018   HGBA1C 6.8 (H) 11/19/2017   Lab Results  Component Value Date   INSULIN 10.7 05/13/2018   INSULIN 18.3 02/06/2018   CBC    Component Value Date/Time   WBC 5.7 02/06/2018 1246   WBC 5.9 10/01/2017 0938   RBC 4.58 02/06/2018 1246   RBC 4.77 10/01/2017 0938   HGB 13.8 02/06/2018 1246   HCT 42.8 02/06/2018 1246   PLT 187.0 10/01/2017 0938   PLT WILL FOLLOW 09/12/2016  0832   MCV 93 02/06/2018 1246   MCH 30.1 02/06/2018 1246   MCH 30.5 05/13/2012 0500   MCHC 32.2 02/06/2018 1246   MCHC  33.8 10/01/2017 0938   RDW 13.8 02/06/2018 1246   LYMPHSABS 2.0 02/06/2018 1246   MONOABS 0.5 10/01/2017 0938   EOSABS 0.0 02/06/2018 1246   BASOSABS 0.0 02/06/2018 1246   Iron/TIBC/Ferritin/ %Sat No results found for: IRON, TIBC, FERRITIN, IRONPCTSAT Lipid Panel     Component Value Date/Time   CHOL 168 02/06/2018 1246   TRIG 144 02/06/2018 1246   HDL 48 02/06/2018 1246   CHOLHDL 4 11/19/2017 1051   VLDL 38.8 11/19/2017 1051   LDLCALC 91 02/06/2018 1246   LDLDIRECT 96.0 10/01/2017 0938   Hepatic Function Panel     Component Value Date/Time   PROT 7.2 05/13/2018 0934   ALBUMIN 4.6 05/13/2018 0934   AST 25 05/13/2018 0934   ALT 30 05/13/2018 0934   ALKPHOS 85 05/13/2018 0934   BILITOT 0.3 05/13/2018 0934   BILIDIR 0.1 10/01/2017 0938   BILIDIR 0.10 08/14/2016 1135   IBILI NOT CALCULATED 04/03/2011 0558      Component Value Date/Time   TSH 2.030 02/06/2018 1246   TSH 2.24 10/01/2017 0938   TSH 1.90 09/11/2016 1406   Results for LAKISA, LOTZ (MRN [id number]) as of 06/10/2018 17:26  Ref. Range 05/13/2018 09:34  Vitamin D, 25-Hydroxy Latest Ref Range: 30.0 - 100.0 ng/mL 53.0     OBESITY BEHAVIORAL INTERVENTION VISIT  Today's visit was # 7   Starting weight: 171 lbs Starting date: 02/06/18 Today's weight : Weight: 158 lb (71.7 kg)  Today's date: 06/10/2018 Total lbs lost to date: 13  ASK: We discussed the diagnosis of obesity with Amy Lang today and Amy Lang agreed to give [name] permission to discuss obesity behavioral modification therapy today.  ASSESS: Amy Lang has the diagnosis of obesity and her BMI today is 29.8. Amy Lang is in the action stage of change   ADVISE: Amy Lang was educated on the multiple health risks of obesity as well as the benefit of weight loss to improve her health. She was advised of the need for long term treatment and the importance of lifestyle modifications to improve her current health and to decrease her risk of future health  problems.  AGREE: Multiple dietary modification options and treatment options were discussed and Amy Lang agreed to follow the recommendations documented in the above note.  ARRANGE: Amy Lang was educated on the importance of frequent visits to treat obesity as outlined per CMS and USPSTF guidelines and agreed to schedule her next follow up appointment today.  I, Marcille Blanco, am acting as transcriptionist for Starlyn Skeans, MD  I have reviewed the above documentation for accuracy and completeness, and I agree with the above. -Dennard Nip, MD

## 2018-07-08 ENCOUNTER — Ambulatory Visit (INDEPENDENT_AMBULATORY_CARE_PROVIDER_SITE_OTHER): Payer: Managed Care, Other (non HMO) | Admitting: Family Medicine

## 2018-07-08 ENCOUNTER — Encounter (INDEPENDENT_AMBULATORY_CARE_PROVIDER_SITE_OTHER): Payer: Self-pay | Admitting: Family Medicine

## 2018-07-08 VITALS — BP 110/67 | HR 66 | Temp 97.5°F | Ht 61.0 in | Wt 158.0 lb

## 2018-07-08 DIAGNOSIS — Z6830 Body mass index (BMI) 30.0-30.9, adult: Secondary | ICD-10-CM | POA: Diagnosis not present

## 2018-07-08 DIAGNOSIS — E669 Obesity, unspecified: Secondary | ICD-10-CM

## 2018-07-08 DIAGNOSIS — R7303 Prediabetes: Secondary | ICD-10-CM

## 2018-07-08 NOTE — Progress Notes (Signed)
Office: [phone]  /  Fax: [phone]   HPI:   Chief Complaint: OBESITY Amy Lang is here to discuss her progress with her obesity treatment plan. She is on the Category 2 plan and is following her eating plan approximately 80 % of the time. She states she is walking, using weights, and riding a stationary bike 20 to 60 minutes 3 times per week. [name] has done well maintaining weight since her last visit, but is frustrated that she did not lose weight this time. She is deviating from her plan more and doing more portion control and smarter choices.  Her weight is 158 lb (71.7 kg) today and has not lost weight since her last visit. She has lost 13 lbs since starting treatment with [name].  Pre-Diabetes [name] has a diagnosis of pre-diabetes based on her elevated Hgb A1c and was informed this puts her at greater risk of developing diabetes. She is taking metformin currently, but frequently forgets to take her 2nd dose of metformin. She notes increased polyphagia, especially in the evening. She continues to work on diet and exercise to decrease risk of diabetes.   ASSESSMENT AND PLAN:  Prediabetes  Class 1 obesity with serious comorbidity and body mass index (BMI) of 30.0 to 30.9 in adult, unspecified obesity type  PLAN:  Pre-Diabetes [name] will continue to work on weight loss, exercise, and decreasing simple carbohydrates in her diet to help decrease the risk of diabetes. We discussed metformin including benefits and risks. She was informed that eating too many simple carbohydrates or too many calories at one sitting increases the likelihood of GI side effects. Reeve agreed to change metformin dose to breakfast and dinner. We may need to start a GLP-1 if there is no improvement in polyphagia. [name] agreed to follow up with [name] as directed to monitor her progress in 3 to 4 weeks.  I spent > than 50% of the 25 minute visit on counseling as documented in the note.  Obesity [name] is currently in the  action stage of change. As such, her goal is to continue with weight loss efforts. She has agreed to keep a food journal with 1100 to 1200 calories and 70+ grams of protein.  [name] has been instructed to work up to a goal of 150 minutes of combined cardio and strengthening exercise per week for weight loss and overall health benefits. We discussed the following Behavioral Modification Strategies today: increasing lean protein intake, decreasing simple carbohydrates, and work on meal planning and easy cooking plans.  [name] has agreed to follow up with our clinic in 3 to 4 weeks. She was informed of the importance of frequent follow up visits to maximize her success with intensive lifestyle modifications for her multiple health conditions.  ALLERGIES: Allergies  Allergen Reactions  . Bee Pollen Anaphylaxis  . Butorphanol   . Butorphanol Tartrate Other (See Comments)    REACTION: hallucinations ( stadol)  . Codeine   . Codeine Sulfate Hives and Nausea And Vomiting  . Crestor [Rosuvastatin] Other (See Comments)    Body aches  . Darvocet [Propoxyphene N-Acetaminophen] Hives and Nausea And Vomiting  . Estrogens   . Levofloxacin Other (See Comments)    abdominal pain  . Metformin And Related     diarrhea  . Penicillamine   . Penicillins Other (See Comments)    Seizure   . Sulfa Antibiotics Hives and Nausea And Vomiting  . Latex Rash    Blisters and redness with bandaids-also sensitivity to latex gloves  MEDICATIONS: Current Outpatient Medications on File Prior to Visit  Medication Sig Dispense Refill  . aspirin EC 81 MG tablet Take 81 mg by mouth every evening.     . cetirizine (ZYRTEC) 10 MG tablet Take 10 mg by mouth daily.     . cyanocobalamin (,VITAMIN B-12,) 1000 MCG/ML injection 1 ml IM or SQ every 2 weeks (lifetime) 10 mL 5  . Ertugliflozin L-PyroglutamicAc (STEGLATRO) 5 MG TABS Take 5 mg by mouth daily. 30 tablet 11  . FLUoxetine (PROZAC) 20 MG tablet TAKE 1 TABLET EVERY DAY  30 tablet 5  . fluticasone (FLONASE) 50 MCG/ACT nasal spray Place 2 sprays into both nostrils daily. 16 g 6  . losartan (COZAAR) 100 MG tablet Take 1 tablet (100 mg total) by mouth daily. 90 tablet 3  . metFORMIN (GLUCOPHAGE) 500 MG tablet Take 1 tablet (500 mg total) by mouth daily with breakfast. -- Office visit needed for further refills 30 tablet 0  . Multiple Vitamin (MULTIVITAMIN WITH MINERALS) TABS Take 1 tablet by mouth daily.    . Multiple Vitamins-Minerals (PRESERVISION AREDS 2) CAPS Take 1 capsule by mouth at bedtime.    . Omega-3 Fatty Acids (FISH OIL) 1000 MG CAPS Take 1,000 mg by mouth daily.    . pantoprazole (PROTONIX) 20 MG tablet Take 1 tablet (20 mg total) by mouth daily before breakfast. 90 tablet 3  . Probiotic Product (ALIGN) 4 MG CAPS Take 1 capsule by mouth daily. 30 capsule 1  . topiramate (TOPAMAX) 200 MG tablet Take 1 tablet (200 mg total) by mouth daily. Must keep OV in March to get refills 90 tablet 3  . VENTOLIN HFA 108 (90 Base) MCG/ACT inhaler TAKE 2 PUFFS BY MOUTH EVERY 6 HOURS AS NEEDED FOR WHEEZE OR SHORTNESS OF BREATH 18 Inhaler 0  . vitamin C (ASCORBIC ACID) 500 MG tablet Take 500 mg by mouth daily.     . Vitamin D, Ergocalciferol, (DRISDOL) 1.25 MG (50000 UT) CAPS capsule Take 1 capsule (50,000 Units total) by mouth every 3 (three) days. 10 capsule 0  . vitamin E 1000 UNIT capsule Take 1,000 Units by mouth daily.     No current facility-administered medications on file prior to visit.     PAST MEDICAL HISTORY: Past Medical History:  Diagnosis Date  . Allergy   . Anxiety   . Asthma   . Depression   . Diabetes ([place])   . Dyslipidemia   . Hypertension   . IBS (irritable bowel syndrome)   . Lactose intolerance   . Migraines   . Obesity   . PONV (postoperative nausea and vomiting)   . Shortness of breath   . Vaginal cancer ([place])   . Vitamin B 12 deficiency   . Vitamin D deficiency     PAST SURGICAL HISTORY: Past Surgical History:  Procedure  Laterality Date  . ABDOMINAL HYSTERECTOMY     still has bilateral ovaries  . BREAST LUMPECTOMY WITH RADIOACTIVE SEED LOCALIZATION Right 08/28/2017   Procedure: BREAST LUMPECTOMY WITH RADIOACTIVE SEED LOCALIZATION;  Surgeon: Erroll Luna, MD;  Location: [facility];  Service: General;  Laterality: Right;  . CHOLECYSTECTOMY  04-04-11   single site  . COLONOSCOPY W/ BIOPSIES  01/24/2013  . ESOPHAGOGASTRODUODENOSCOPY    . KNEE SURGERY    . LAPAROTOMY  05/12/2012   Procedure: LAPAROTOMY;  Surgeon: Cyril Mourning, MD;  Location: [facility];  Service: Gynecology;;  laparotomy [name] salpingectomy-oopharectomy  . NM MYOCAR PERF WALL MOTION  09/06/2009  normal  . TRANSTHORACIC ECHOCARDIOGRAM  March 2014   Normal regional wall motion. EF 55%. Mild MR    SOCIAL HISTORY: Social History   Tobacco Use  . Smoking status: Never Smoker  . Smokeless tobacco: Never Used  . Tobacco comment: Regular Exercise - No  Substance Use Topics  . Alcohol use: No  . Drug use: No    FAMILY HISTORY: Family History  Problem Relation Age of Onset  . Heart disease Mother   . COPD Mother   . Heart attack Mother 35  . Diabetes Mother   . Hypertension Mother   . Hyperlipidemia Mother   . Kidney disease Mother   . Thyroid disease Mother   . Lung cancer Father   . Hypertension Father   . Hyperlipidemia Father   . Heart disease Maternal Grandmother   . Breast cancer Maternal Grandmother 26  . Heart disease Maternal Grandfather   . Heart disease Paternal Grandmother   . Heart disease Paternal Grandfather   . Colon cancer Neg Hx   . Esophageal cancer Neg Hx   . Rectal cancer Neg Hx   . Stomach cancer Neg Hx     ROS: Review of Systems  Constitutional: Negative for weight loss.  Endo/Heme/Allergies:       Positive for polyphagia.    PHYSICAL EXAM: Blood pressure 110/67, pulse 66, temperature (!) 97.5 F (36.4 C), temperature source Oral, height 5\' 1"  (1.549 m), weight 158 lb (71.7  kg), SpO2 100 %. Body mass index is 29.85 kg/m. Physical Exam Vitals signs reviewed.  Constitutional:      Appearance: Normal appearance. She is obese.  Cardiovascular:     Rate and Rhythm: Normal rate.  Pulmonary:     Effort: Pulmonary effort is normal.  Musculoskeletal: Normal range of motion.  Skin:    General: Skin is warm and dry.  Neurological:     Mental Status: She is alert and oriented to person, place, and time.  Psychiatric:        Mood and Affect: Mood normal.        Behavior: Behavior normal.     RECENT LABS AND TESTS: BMET    Component Value Date/Time   NA 144 05/13/2018 0934   K 4.4 05/13/2018 0934   CL 108 (H) 05/13/2018 0934   CO2 19 (L) 05/13/2018 0934   GLUCOSE 89 05/13/2018 0934   GLUCOSE 126 (H) 11/19/2017 1051   BUN 18 05/13/2018 0934   CREATININE 0.78 05/13/2018 0934   CALCIUM 9.2 05/13/2018 0934   GFRNONAA 86 05/13/2018 0934   GFRAA 100 05/13/2018 0934   Lab Results  Component Value Date   HGBA1C 5.7 (H) 05/13/2018   HGBA1C 6.0 (H) 02/06/2018   HGBA1C 6.8 (H) 11/19/2017   Lab Results  Component Value Date   INSULIN 10.7 05/13/2018   INSULIN 18.3 02/06/2018   CBC    Component Value Date/Time   WBC 5.7 02/06/2018 1246   WBC 5.9 10/01/2017 0938   RBC 4.58 02/06/2018 1246   RBC 4.77 10/01/2017 0938   HGB 13.8 02/06/2018 1246   HCT 42.8 02/06/2018 1246   PLT 187.0 10/01/2017 0938   PLT WILL FOLLOW 09/12/2016 0832   MCV 93 02/06/2018 1246   MCH 30.1 02/06/2018 1246   MCH 30.5 05/13/2012 0500   MCHC 32.2 02/06/2018 1246   MCHC 33.8 10/01/2017 0938   RDW 13.8 02/06/2018 1246   LYMPHSABS 2.0 02/06/2018 1246   MONOABS 0.5 10/01/2017 0938   EOSABS 0.0 02/06/2018  1246   BASOSABS 0.0 02/06/2018 1246   Iron/TIBC/Ferritin/ %Sat No results found for: IRON, TIBC, FERRITIN, IRONPCTSAT Lipid Panel     Component Value Date/Time   CHOL 168 02/06/2018 1246   TRIG 144 02/06/2018 1246   HDL 48 02/06/2018 1246   CHOLHDL 4 11/19/2017 1051     VLDL 38.8 11/19/2017 1051   LDLCALC 91 02/06/2018 1246   LDLDIRECT 96.0 10/01/2017 0938   Hepatic Function Panel     Component Value Date/Time   PROT 7.2 05/13/2018 0934   ALBUMIN 4.6 05/13/2018 0934   AST 25 05/13/2018 0934   ALT 30 05/13/2018 0934   ALKPHOS 85 05/13/2018 0934   BILITOT 0.3 05/13/2018 0934   BILIDIR 0.1 10/01/2017 0938   BILIDIR 0.10 08/14/2016 1135   IBILI NOT CALCULATED 04/03/2011 0558      Component Value Date/Time   TSH 2.030 02/06/2018 1246   TSH 2.24 10/01/2017 0938   TSH 1.90 09/11/2016 1406   Results for STEWART, PIMENTA (MRN [id number]) as of 07/08/2018 13:45  Ref. Range 05/13/2018 09:34  Vitamin D, 25-Hydroxy Latest Ref Range: 30.0 - 100.0 ng/mL 53.0   OBESITY BEHAVIORAL INTERVENTION VISIT  Today's visit was # 8   Starting weight: 171 lbs Starting date: 02/06/18 Today's weight : Weight: 158 lb (71.7 kg)  Today's date: 07/08/2018 Total lbs lost to date: 13  ASK: We discussed the diagnosis of obesity with [name] today and Sebrena agreed to give [name] permission to discuss obesity behavioral modification therapy today.  ASSESS: Adanely has the diagnosis of obesity and her BMI today is 29.8. Jazmeen is in the action stage of change.   ADVISE: Denia was educated on the multiple health risks of obesity as well as the benefit of weight loss to improve her health. She was advised of the need for long term treatment and the importance of lifestyle modifications to improve her current health and to decrease her risk of future health problems.  AGREE: Multiple dietary modification options and treatment options were discussed and Carmie agreed to follow the recommendations documented in the above note.  ARRANGE: Ailany was educated on the importance of frequent visits to treat obesity as outlined per CMS and USPSTF guidelines and agreed to schedule her next follow up appointment today.  IMarcille Blanco, CMA, am acting as transcriptionist for Starlyn Skeans,  MD  I have reviewed the above documentation for accuracy and completeness, and I agree with the above. -Dennard Nip, MD

## 2018-08-05 ENCOUNTER — Encounter (INDEPENDENT_AMBULATORY_CARE_PROVIDER_SITE_OTHER): Payer: Self-pay | Admitting: Family Medicine

## 2018-08-05 ENCOUNTER — Ambulatory Visit (INDEPENDENT_AMBULATORY_CARE_PROVIDER_SITE_OTHER): Payer: Managed Care, Other (non HMO) | Admitting: Family Medicine

## 2018-08-05 VITALS — BP 113/69 | HR 60 | Ht 61.0 in | Wt 158.0 lb

## 2018-08-05 DIAGNOSIS — E119 Type 2 diabetes mellitus without complications: Secondary | ICD-10-CM

## 2018-08-05 DIAGNOSIS — E669 Obesity, unspecified: Secondary | ICD-10-CM | POA: Diagnosis not present

## 2018-08-05 DIAGNOSIS — Z9189 Other specified personal risk factors, not elsewhere classified: Secondary | ICD-10-CM | POA: Diagnosis not present

## 2018-08-05 DIAGNOSIS — Z6830 Body mass index (BMI) 30.0-30.9, adult: Secondary | ICD-10-CM

## 2018-08-05 DIAGNOSIS — E559 Vitamin D deficiency, unspecified: Secondary | ICD-10-CM | POA: Diagnosis not present

## 2018-08-05 MED ORDER — VITAMIN D (ERGOCALCIFEROL) 1.25 MG (50000 UNIT) PO CAPS: 50000.0000 [IU] | ORAL_CAPSULE | ORAL | 0 refills | Status: DC

## 2018-08-05 NOTE — Progress Notes (Signed)
Office: [phone]  /  Fax: [phone]   HPI:   Chief Complaint: OBESITY Amy Lang is here to discuss her progress with her obesity treatment plan. She is on the Category 2 plan and is following her eating plan approximately 80 % of the time. She states she is walking and using hand weights 35 to 40 minutes 4 times per week. Amy Lang has done very well with maintaining her weight last month. She has had extra stressors and challenged, but she was still mindful and tried to portion control and smarter choices.  Her weight is 158 lb (71.7 kg) today and has not lost weight since her last visit. She has lost 13 lbs since starting treatment with [name].  Diabetes II Amy Lang has a diagnosis of diabetes type II. Amy Lang's last A1c was 5.7 on 05/13/18 and was well controlled. She is on metformin 500mg  and Steglatro 5mg . She denies nausea, vomiting, or any hypoglycemic episodes. She has been working on intensive lifestyle modifications including diet, exercise, and weight loss to help control her blood glucose levels.  Vitamin D deficiency Amy Lang has a diagnosis of vitamin D deficiency. She is currently stable on vit D and is now at goal. She denies nausea, vomiting, or muscle weakness.  At risk for osteopenia and osteoporosis Amy Lang is at higher risk of osteopenia and osteoporosis due to vitamin D deficiency.   ASSESSMENT AND PLAN:  Type 2 diabetes mellitus without complication, without long-term current use of insulin (Amy Lang) - Plan: Vitamin B12, CBC With Differential, Comprehensive metabolic panel, Hemoglobin A1c, Insulin, random, Lipid Panel With LDL/HDL Ratio  Vitamin D deficiency - Plan: VITAMIN D 25 Hydroxy (Vit-D Deficiency, Fractures), Vitamin D, Ergocalciferol, (DRISDOL) 1.25 MG (50000 UT) CAPS capsule  At risk for osteoporosis  Class 1 obesity with serious comorbidity and body mass index (BMI) of 30.0 to 30.9 in adult, unspecified obesity type  PLAN:  Diabetes II Amy Lang has been given extensive  diabetes education by myself today including ideal fasting and post-prandial blood glucose readings, individual ideal Hgb A1c goals, and hypoglycemia prevention. We discussed the importance of good blood sugar control to decrease the likelihood of diabetic complications such as nephropathy, neuropathy, limb loss, blindness, coronary artery disease, and death. We discussed the importance of intensive lifestyle modification including diet, exercise and weight loss as the first line treatment for diabetes. We will order labs today and Amy Lang agrees to continue her diet prescription and diabetes medications and will follow up at the agreed upon time in 4 weeks.  Vitamin D Deficiency Amy Lang was informed that low vitamin D levels contributes to fatigue and are associated with obesity, breast, and colon cancer. Amy Lang agrees to continue to take prescription Vit D @50 ,000 IU every 3 days #10 with no refills and will follow up for routine testing of vitamin D, at least 2-3 times per year. She was informed of the risk of over-replacement of vitamin D and agrees to not increase her dose unless she discusses this with [name] first. Amy Lang agrees to follow up in 4 weeks as directed.  At risk for osteopenia and osteoporosis Amy Lang was given extended (15 minutes) osteoporosis prevention counseling today. Amy Lang is at risk for osteopenia and osteoporosis due to her vitamin D deficiency. She was encouraged to take her vitamin D and follow her higher calcium diet and increase strengthening exercise to help strengthen her bones and decrease her risk of osteopenia and osteoporosis.  Obesity Amy Lang is currently in the action stage of change. As such, her goal  is to continue with weight loss efforts. She has agreed to follow the Category 2 plan. Amy Lang has been instructed to work up to a goal of 150 minutes of combined cardio and strengthening exercise per week for weight loss and overall health benefits. We discussed the following Behavioral  Modification Strategies today: increasing lean protein intake, decreasing simple carbohydrates, work on meal planning and easy cooking plans, and emotional eating strategies.  Amy Lang has agreed to follow up with our clinic in 4 weeks. She was informed of the importance of frequent follow up visits to maximize her success with intensive lifestyle modifications for her multiple health conditions.  ALLERGIES: Allergies  Allergen Reactions  . Bee Pollen Anaphylaxis  . Butorphanol   . Butorphanol Tartrate Other (See Comments)    REACTION: hallucinations ( stadol)  . Codeine   . Codeine Sulfate Hives and Nausea And Vomiting  . Crestor [Rosuvastatin] Other (See Comments)    Body aches  . Amy Lang [Propoxyphene N-Acetaminophen] Hives and Nausea And Vomiting  . Estrogens   . Levofloxacin Other (See Comments)    abdominal pain  . Metformin And Related     diarrhea  . Penicillamine   . Penicillins Other (See Comments)    Seizure   . Sulfa Antibiotics Hives and Nausea And Vomiting  . Latex Rash    Blisters and redness with bandaids-also sensitivity to latex gloves    MEDICATIONS: Current Outpatient Medications on File Prior to Visit  Medication Sig Dispense Refill  . aspirin EC 81 MG tablet Take 81 mg by mouth every evening.     . cetirizine (ZYRTEC) 10 MG tablet Take 10 mg by mouth daily.     . cyanocobalamin (,VITAMIN B-12,) 1000 MCG/ML injection 1 ml IM or SQ every 2 weeks (lifetime) 10 mL 5  . Ertugliflozin L-PyroglutamicAc (STEGLATRO) 5 MG TABS Take 5 mg by mouth daily. 30 tablet 11  . FLUoxetine (PROZAC) 20 MG tablet TAKE 1 TABLET EVERY DAY 30 tablet 5  . fluticasone (FLONASE) 50 MCG/ACT nasal spray Place 2 sprays into both nostrils daily. 16 g 6  . losartan (COZAAR) 100 MG tablet Take 1 tablet (100 mg total) by mouth daily. 90 tablet 3  . metFORMIN (GLUCOPHAGE) 500 MG tablet Take 1 tablet (500 mg total) by mouth daily with breakfast. -- Office visit needed for further refills 30  tablet 0  . Multiple Vitamin (MULTIVITAMIN WITH MINERALS) TABS Take 1 tablet by mouth daily.    . Multiple Vitamins-Minerals (PRESERVISION AREDS 2) CAPS Take 1 capsule by mouth at bedtime.    . Omega-3 Fatty Acids (FISH OIL) 1000 MG CAPS Take 1,000 mg by mouth daily.    . pantoprazole (PROTONIX) 20 MG tablet Take 1 tablet (20 mg total) by mouth daily before breakfast. 90 tablet 3  . Probiotic Product (ALIGN) 4 MG CAPS Take 1 capsule by mouth daily. 30 capsule 1  . topiramate (TOPAMAX) 200 MG tablet Take 1 tablet (200 mg total) by mouth daily. Must keep OV in March to get refills 90 tablet 3  . VENTOLIN HFA 108 (90 Base) MCG/ACT inhaler TAKE 2 PUFFS BY MOUTH EVERY 6 HOURS AS NEEDED FOR WHEEZE OR SHORTNESS OF BREATH 18 Inhaler 0  . vitamin C (ASCORBIC ACID) 500 MG tablet Take 500 mg by mouth daily.     . vitamin E 1000 UNIT capsule Take 1,000 Units by mouth daily.     No current facility-administered medications on file prior to visit.     PAST MEDICAL  HISTORY: Past Medical History:  Diagnosis Date  . Allergy   . Anxiety   . Asthma   . Depression   . Diabetes ([place])   . Dyslipidemia   . Hypertension   . IBS (irritable bowel syndrome)   . Lactose intolerance   . Migraines   . Obesity   . PONV (postoperative nausea and vomiting)   . Shortness of breath   . Vaginal cancer ([place])   . Vitamin B 12 deficiency   . Vitamin D deficiency     PAST SURGICAL HISTORY: Past Surgical History:  Procedure Laterality Date  . ABDOMINAL HYSTERECTOMY     still has bilateral ovaries  . BREAST LUMPECTOMY WITH RADIOACTIVE SEED LOCALIZATION Right 08/28/2017   Procedure: BREAST LUMPECTOMY WITH RADIOACTIVE SEED LOCALIZATION;  Surgeon: Erroll Luna, MD;  Location: [facility];  Service: General;  Laterality: Right;  . CHOLECYSTECTOMY  04-04-11   single site  . COLONOSCOPY W/ BIOPSIES  01/24/2013  . ESOPHAGOGASTRODUODENOSCOPY    . KNEE SURGERY    . LAPAROTOMY  05/12/2012   Procedure:  LAPAROTOMY;  Surgeon: Cyril Mourning, MD;  Location: [facility];  Service: Gynecology;;  laparotomy [name] salpingectomy-oopharectomy  . NM MYOCAR PERF WALL MOTION  09/06/2009   normal  . TRANSTHORACIC ECHOCARDIOGRAM  March 2014   Normal regional wall motion. EF 55%. Mild MR    SOCIAL HISTORY: Social History   Tobacco Use  . Smoking status: Never Smoker  . Smokeless tobacco: Never Used  . Tobacco comment: Regular Exercise - No  Substance Use Topics  . Alcohol use: No  . Drug use: No    FAMILY HISTORY: Family History  Problem Relation Age of Onset  . Heart disease Mother   . COPD Mother   . Heart attack Mother 49  . Diabetes Mother   . Hypertension Mother   . Hyperlipidemia Mother   . Kidney disease Mother   . Thyroid disease Mother   . Lung cancer Father   . Hypertension Father   . Hyperlipidemia Father   . Heart disease Maternal Grandmother   . Breast cancer Maternal Grandmother 62  . Heart disease Maternal Grandfather   . Heart disease Paternal Grandmother   . Heart disease Paternal Grandfather   . Colon cancer Neg Hx   . Esophageal cancer Neg Hx   . Rectal cancer Neg Hx   . Stomach cancer Neg Hx     ROS: Review of Systems  Constitutional: Negative for weight loss.  Gastrointestinal: Negative for nausea and vomiting.  Musculoskeletal:       Negative for muscle weakness.  Endo/Heme/Allergies:       Negative for hypoglycemia.    PHYSICAL EXAM: Blood pressure 113/69, pulse 60, height 5\' 1"  (1.549 m), weight 158 lb (71.7 kg), SpO2 100 %. Body mass index is 29.85 kg/m. Physical Exam Vitals signs reviewed.  Constitutional:      Appearance: Normal appearance. She is obese.  Cardiovascular:     Rate and Rhythm: Normal rate.  Pulmonary:     Effort: Pulmonary effort is normal.  Musculoskeletal: Normal range of motion.  Skin:    General: Skin is warm and dry.  Neurological:     Mental Status: She is alert and oriented to person, place, and time.    Psychiatric:        Mood and Affect: Mood normal.        Behavior: Behavior normal.     RECENT LABS AND TESTS: BMET    Component  Value Date/Time   NA 144 05/13/2018 0934   K 4.4 05/13/2018 0934   CL 108 (H) 05/13/2018 0934   CO2 19 (L) 05/13/2018 0934   GLUCOSE 89 05/13/2018 0934   GLUCOSE 126 (H) 11/19/2017 1051   BUN 18 05/13/2018 0934   CREATININE 0.78 05/13/2018 0934   CALCIUM 9.2 05/13/2018 0934   GFRNONAA 86 05/13/2018 0934   GFRAA 100 05/13/2018 0934   Lab Results  Component Value Date   HGBA1C 5.7 (H) 05/13/2018   HGBA1C 6.0 (H) 02/06/2018   HGBA1C 6.8 (H) 11/19/2017   Lab Results  Component Value Date   INSULIN 10.7 05/13/2018   INSULIN 18.3 02/06/2018   CBC    Component Value Date/Time   WBC 5.7 02/06/2018 1246   WBC 5.9 10/01/2017 0938   RBC 4.58 02/06/2018 1246   RBC 4.77 10/01/2017 0938   HGB 13.8 02/06/2018 1246   HCT 42.8 02/06/2018 1246   PLT 187.0 10/01/2017 0938   PLT WILL FOLLOW 09/12/2016 0832   MCV 93 02/06/2018 1246   MCH 30.1 02/06/2018 1246   MCH 30.5 05/13/2012 0500   MCHC 32.2 02/06/2018 1246   MCHC 33.8 10/01/2017 0938   RDW 13.8 02/06/2018 1246   LYMPHSABS 2.0 02/06/2018 1246   MONOABS 0.5 10/01/2017 0938   EOSABS 0.0 02/06/2018 1246   BASOSABS 0.0 02/06/2018 1246   Iron/TIBC/Ferritin/ %Sat No results found for: IRON, TIBC, FERRITIN, IRONPCTSAT Lipid Panel     Component Value Date/Time   CHOL 168 02/06/2018 1246   TRIG 144 02/06/2018 1246   HDL 48 02/06/2018 1246   CHOLHDL 4 11/19/2017 1051   VLDL 38.8 11/19/2017 1051   LDLCALC 91 02/06/2018 1246   LDLDIRECT 96.0 10/01/2017 0938   Hepatic Function Panel     Component Value Date/Time   PROT 7.2 05/13/2018 0934   ALBUMIN 4.6 05/13/2018 0934   AST 25 05/13/2018 0934   ALT 30 05/13/2018 0934   ALKPHOS 85 05/13/2018 0934   BILITOT 0.3 05/13/2018 0934   BILIDIR 0.1 10/01/2017 0938   BILIDIR 0.10 08/14/2016 1135   IBILI NOT CALCULATED 04/03/2011 0558       Component Value Date/Time   TSH 2.030 02/06/2018 1246   TSH 2.24 10/01/2017 0938   TSH 1.90 09/11/2016 1406   Results for DEMIYAH, FISCHBACH (MRN [id number]) as of 08/05/2018 15:43  Ref. Range 05/13/2018 09:34  Vitamin D, 25-Hydroxy Latest Ref Range: 30.0 - 100.0 ng/mL 53.0   OBESITY BEHAVIORAL INTERVENTION VISIT  Today's visit was # 9  Starting weight: 171 lbs Starting date: 02/06/18 Today's weight : Weight: 158 lb (71.7 kg)  Today's date: 08/05/2018 Total lbs lost to date: 13    08/05/2018  Height 5\' 1"  (1.549 m)  Weight 158 lb (71.7 kg)  BMI (Calculated) 29.87  BLOOD PRESSURE - SYSTOLIC 627  BLOOD PRESSURE - DIASTOLIC 69   Body Fat % 03.5 %  Total Body Water (lbs) 65.6 lbs    ASK: We discussed the diagnosis of obesity with Amy Lang today and Amy Lang agreed to give [name] permission to discuss obesity behavioral modification therapy today.  ASSESS: Amy Lang has the diagnosis of obesity and her BMI today is 29.87. Amy Lang is in the action stage of change.   ADVISE: Amy Lang was educated on the multiple health risks of obesity as well as the benefit of weight loss to improve her health. She was advised of the need for long term treatment and the importance of lifestyle modifications to improve her current  health and to decrease her risk of future health problems.  AGREE: Multiple dietary modification options and treatment options were discussed and Martine agreed to follow the recommendations documented in the above note.  ARRANGE: Shakeitha was educated on the importance of frequent visits to treat obesity as outlined per CMS and USPSTF guidelines and agreed to schedule her next follow up appointment today.  IMarcille Blanco, CMA, am acting as transcriptionist for Starlyn Skeans, MD  I have reviewed the above documentation for accuracy and completeness, and I agree with the above. -Dennard Nip, MD

## 2018-08-06 LAB — CBC WITH DIFFERENTIAL
Basophils Absolute: 0 10*3/uL (ref 0.0–0.2)
Basos: 1 %
EOS (ABSOLUTE): 0.1 10*3/uL (ref 0.0–0.4)
Eos: 1 %
Hematocrit: 44.9 % (ref 34.0–46.6)
Hemoglobin: 14.8 g/dL (ref 11.1–15.9)
IMMATURE GRANS (ABS): 0 10*3/uL (ref 0.0–0.1)
Immature Granulocytes: 0 %
LYMPHS: 29 %
Lymphocytes Absolute: 1.7 10*3/uL (ref 0.7–3.1)
MCH: 30.6 pg (ref 26.6–33.0)
MCHC: 33 g/dL (ref 31.5–35.7)
MCV: 93 fL (ref 79–97)
Monocytes Absolute: 0.4 10*3/uL (ref 0.1–0.9)
Monocytes: 7 %
Neutrophils Absolute: 3.6 10*3/uL (ref 1.4–7.0)
Neutrophils: 62 %
RBC: 4.83 x10E6/uL (ref 3.77–5.28)
RDW: 12.6 % (ref 11.7–15.4)
WBC: 5.8 10*3/uL (ref 3.4–10.8)

## 2018-08-06 LAB — LIPID PANEL WITH LDL/HDL RATIO
Cholesterol, Total: 186 mg/dL (ref 100–199)
HDL: 50 mg/dL (ref >39)
LDL Calculated: 102 mg/dL — ABNORMAL HIGH (ref 0–99)
LDl/HDL Ratio: 2 ratio (ref 0.0–3.2)
Triglycerides: 172 mg/dL — ABNORMAL HIGH (ref 0–149)
VLDL Cholesterol Cal: 34 mg/dL (ref 5–40)

## 2018-08-06 LAB — HEMOGLOBIN A1C
Est. average glucose Bld gHb Est-mCnc: 111 mg/dL
Hgb A1c MFr Bld: 5.5 % (ref 4.8–5.6)

## 2018-08-06 LAB — COMPREHENSIVE METABOLIC PANEL
ALT: 23 IU/L (ref 0–32)
AST: 19 IU/L (ref 0–40)
Albumin/Globulin Ratio: 1.8 (ref 1.2–2.2)
Albumin: 4.6 g/dL (ref 3.8–4.9)
Alkaline Phosphatase: 82 IU/L (ref 39–117)
BUN/Creatinine Ratio: 26 — ABNORMAL HIGH (ref 9–23)
BUN: 21 mg/dL (ref 6–24)
Bilirubin Total: 0.3 mg/dL (ref 0.0–1.2)
CO2: 20 mmol/L (ref 20–29)
Calcium: 9.2 mg/dL (ref 8.7–10.2)
Chloride: 107 mmol/L — ABNORMAL HIGH (ref 96–106)
Creatinine, Ser: 0.81 mg/dL (ref 0.57–1.00)
GFR calc Af Amer: 95 mL/min/{1.73_m2} (ref >59)
GFR calc non Af Amer: 83 mL/min/{1.73_m2} (ref >59)
Globulin, Total: 2.5 g/dL (ref 1.5–4.5)
Glucose: 95 mg/dL (ref 65–99)
Potassium: 4.3 mmol/L (ref 3.5–5.2)
Sodium: 143 mmol/L (ref 134–144)
Total Protein: 7.1 g/dL (ref 6.0–8.5)

## 2018-08-06 LAB — VITAMIN B12: Vitamin B-12: 608 pg/mL (ref 232–1245)

## 2018-08-06 LAB — VITAMIN D 25 HYDROXY (VIT D DEFICIENCY, FRACTURES): Vit D, 25-Hydroxy: 39.9 ng/mL (ref 30.0–100.0)

## 2018-08-06 LAB — INSULIN, RANDOM: INSULIN: 14.1 u[IU]/mL (ref 2.6–24.9)

## 2018-08-14 ENCOUNTER — Other Ambulatory Visit: Payer: Self-pay | Admitting: Internal Medicine

## 2018-08-20 ENCOUNTER — Encounter (INDEPENDENT_AMBULATORY_CARE_PROVIDER_SITE_OTHER): Payer: Self-pay

## 2018-08-27 ENCOUNTER — Other Ambulatory Visit (INDEPENDENT_AMBULATORY_CARE_PROVIDER_SITE_OTHER): Payer: Self-pay | Admitting: Family Medicine

## 2018-08-27 DIAGNOSIS — E559 Vitamin D deficiency, unspecified: Secondary | ICD-10-CM

## 2018-09-02 ENCOUNTER — Ambulatory Visit (INDEPENDENT_AMBULATORY_CARE_PROVIDER_SITE_OTHER): Payer: Self-pay | Admitting: Family Medicine

## 2018-09-14 ENCOUNTER — Other Ambulatory Visit: Payer: Self-pay | Admitting: Internal Medicine

## 2018-10-26 ENCOUNTER — Other Ambulatory Visit: Payer: Self-pay | Admitting: Internal Medicine

## 2018-10-30 ENCOUNTER — Other Ambulatory Visit: Payer: Self-pay | Admitting: Internal Medicine

## 2018-10-31 ENCOUNTER — Telehealth: Payer: Self-pay

## 2018-10-31 NOTE — Telephone Encounter (Signed)
Will refill.  Thanks

## 2018-10-31 NOTE — Telephone Encounter (Signed)
Copied from [place] [phone]. Topic: General - Other >> Oct 31, 2018  1:59 PM Leward Quan A wrote: Reason for CRM: Patient called to say that she need a refill on her topiramate (TOPAMAX) 200 MG tablet. She states that she does not have any insurance and her husband lost his job and she need this refill. Please advise Ph# [phone]

## 2018-11-01 NOTE — Telephone Encounter (Signed)
Called pt no answer LMOM MD sent rx to [facility] 

## 2018-11-02 ENCOUNTER — Other Ambulatory Visit: Payer: Self-pay | Admitting: Internal Medicine

## 2018-11-22 ENCOUNTER — Other Ambulatory Visit: Payer: Self-pay | Admitting: Internal Medicine

## 2018-12-16 ENCOUNTER — Other Ambulatory Visit: Payer: Self-pay | Admitting: Internal Medicine

## 2019-01-27 ENCOUNTER — Other Ambulatory Visit: Payer: Self-pay | Admitting: Internal Medicine

## 2019-02-22 ENCOUNTER — Other Ambulatory Visit: Payer: Self-pay | Admitting: Internal Medicine

## 2019-02-24 ENCOUNTER — Encounter: Payer: Self-pay | Admitting: Internal Medicine

## 2019-02-24 ENCOUNTER — Ambulatory Visit (INDEPENDENT_AMBULATORY_CARE_PROVIDER_SITE_OTHER): Payer: Self-pay | Admitting: Internal Medicine

## 2019-02-24 ENCOUNTER — Other Ambulatory Visit: Payer: Self-pay

## 2019-02-24 DIAGNOSIS — F413 Other mixed anxiety disorders: Secondary | ICD-10-CM

## 2019-02-24 DIAGNOSIS — E119 Type 2 diabetes mellitus without complications: Secondary | ICD-10-CM

## 2019-02-24 DIAGNOSIS — E559 Vitamin D deficiency, unspecified: Secondary | ICD-10-CM

## 2019-02-24 MED ORDER — VITAMIN D3 50 MCG (2000 UT) PO CAPS: 2000.0000 [IU] | ORAL_CAPSULE | Freq: Every day | ORAL | 3 refills | Status: AC

## 2019-02-24 MED ORDER — FLUOXETINE HCL 40 MG PO CAPS: 40.0000 mg | ORAL_CAPSULE | Freq: Every day | ORAL | 1 refills | Status: DC

## 2019-02-24 MED ORDER — ALPRAZOLAM 0.25 MG PO TABS: 0.2500 mg | ORAL_TABLET | Freq: Two times a day (BID) | ORAL | 1 refills | Status: DC | PRN

## 2019-02-24 NOTE — Patient Instructions (Signed)

## 2019-02-24 NOTE — Assessment & Plan Note (Signed)
Vit D 

## 2019-02-24 NOTE — Assessment & Plan Note (Signed)
Prozac - will increase the dose Xanax prn  Potential benefits of a long term benzodiazepines  use as well as potential risks  and complications were explained to the patient and were aknowledged.

## 2019-02-24 NOTE — Progress Notes (Signed)
Subjective:  Patient ID: Amy Lang, female    DOB: [birth year]  Age: 53 y.o. MRN: [id number]  CC: No chief complaint on file.   HPI Amy Lang presents for severe anxiety, HTN C/o stress, panic attacks...  Outpatient Medications Prior to Visit  Medication Sig Dispense Refill  . aspirin EC 81 MG tablet Take 81 mg by mouth every evening.     . cetirizine (ZYRTEC) 10 MG tablet Take 10 mg by mouth daily.     . cyanocobalamin (,VITAMIN B-12,) 1000 MCG/ML injection INJECT 1 ML IM OR SUBQ EVERY 2 WEEKS (LIFETIME) 6 mL 3  . Fluoxetine HCl, PMDD, 20 MG TABS TAKE 1 TABLET BY MOUTH EVERY DAY 90 tablet 1  . fluticasone (FLONASE) 50 MCG/ACT nasal spray Place 2 sprays into both nostrils daily. 16 g 6  . losartan (COZAAR) 100 MG tablet TAKE 1 TABLET BY MOUTH EVERY DAY 90 tablet 3  . metFORMIN (GLUCOPHAGE) 500 MG tablet Take 1 tablet (500 mg total) by mouth daily with breakfast. -- Office visit needed for further refills 30 tablet 0  . Multiple Vitamin (MULTIVITAMIN WITH MINERALS) TABS Take 1 tablet by mouth daily.    . Multiple Vitamins-Minerals (PRESERVISION AREDS 2) CAPS Take 1 capsule by mouth at bedtime.    . Omega-3 Fatty Acids (FISH OIL) 1000 MG CAPS Take 1,000 mg by mouth daily.    . pantoprazole (PROTONIX) 20 MG tablet Take 1 tablet (20 mg total) by mouth daily before breakfast. **NEEDS OFFICE VISIT FOR FUTURE REFILLS** 90 tablet 0  . Probiotic Product (ALIGN) 4 MG CAPS Take 1 capsule by mouth daily. 30 capsule 1  . STEGLATRO 5 MG TABS TAKE 1 TABLET EVERY DAY 30 tablet 11  . topiramate (TOPAMAX) 200 MG tablet Take 1 tablet (200 mg total) by mouth daily. Patient needs office visit before refills will be given 90 tablet 1  . VENTOLIN HFA 108 (90 Base) MCG/ACT inhaler TAKE 2 PUFFS BY MOUTH EVERY 6 HOURS AS NEEDED FOR WHEEZE OR SHORTNESS OF BREATH 18 Inhaler 0  . vitamin C (ASCORBIC ACID) 500 MG tablet Take 500 mg by mouth daily.     . Vitamin D, Ergocalciferol, (DRISDOL) 1.25 MG (50000  UT) CAPS capsule Take 1 capsule (50,000 Units total) by mouth every 3 (three) days. 10 capsule 0  . vitamin E 1000 UNIT capsule Take 1,000 Units by mouth daily.     No facility-administered medications prior to visit.     ROS: Review of Systems  Constitutional: Positive for unexpected weight change. Negative for activity change, appetite change, chills and fatigue.  HENT: Negative for congestion, mouth sores and sinus pressure.   Eyes: Negative for visual disturbance.  Respiratory: Negative for cough and chest tightness.   Gastrointestinal: Negative for abdominal pain and nausea.  Genitourinary: Negative for difficulty urinating, frequency and vaginal pain.  Musculoskeletal: Negative for back pain and gait problem.  Skin: Negative for pallor and rash.  Neurological: Negative for dizziness, tremors, weakness, numbness and headaches.  Psychiatric/Behavioral: Positive for dysphoric mood. Negative for confusion, sleep disturbance and suicidal ideas. The patient is nervous/anxious.     Objective:  BP 132/84 (BP Location: Left Arm, Patient Position: Sitting, Cuff Size: Normal)   Pulse 65   Temp 98.2 F (36.8 C) (Oral)   Ht 5\' 1"  (1.549 m)   Wt 164 lb (74.4 kg)   SpO2 97%   BMI 30.99 kg/m   BP Readings from Last 3 Encounters:  02/24/19 132/84  08/05/18  113/69  07/08/18 110/67    Wt Readings from Last 3 Encounters:  02/24/19 164 lb (74.4 kg)  08/05/18 158 lb (71.7 kg)  07/08/18 158 lb (71.7 kg)    Physical Exam Constitutional:      General: She is not in acute distress.    Appearance: She is well-developed. She is obese.  HENT:     Head: Normocephalic.     Right Ear: External ear normal.     Left Ear: External ear normal.     Nose: Nose normal.  Eyes:     General:        Right eye: No discharge.        Left eye: No discharge.     Conjunctiva/sclera: Conjunctivae normal.     Pupils: Pupils are equal, round, and reactive to light.  Neck:     Musculoskeletal: Normal  range of motion and neck supple.     Thyroid: No thyromegaly.     Vascular: No JVD.     Trachea: No tracheal deviation.  Cardiovascular:     Rate and Rhythm: Normal rate and regular rhythm.     Heart sounds: Normal heart sounds.  Pulmonary:     Effort: No respiratory distress.     Breath sounds: No stridor. No wheezing.  Abdominal:     General: Bowel sounds are normal. There is no distension.     Palpations: Abdomen is soft. There is no mass.     Tenderness: There is no abdominal tenderness. There is no guarding or rebound.  Musculoskeletal:        General: No tenderness.  Lymphadenopathy:     Cervical: No cervical adenopathy.  Skin:    Findings: No erythema or rash.  Neurological:     Cranial Nerves: No cranial nerve deficit.     Motor: No abnormal muscle tone.     Coordination: Coordination normal.     Deep Tendon Reflexes: Reflexes normal.  Psychiatric:        Behavior: Behavior normal.        Thought Content: Thought content normal.        Judgment: Judgment normal.     Lab Results  Component Value Date   WBC 5.8 08/05/2018   HGB 14.8 08/05/2018   HCT 44.9 08/05/2018   PLT 187.0 10/01/2017   GLUCOSE 95 08/05/2018   CHOL 186 08/05/2018   TRIG 172 (H) 08/05/2018   HDL 50 08/05/2018   LDLDIRECT 96.0 10/01/2017   LDLCALC 102 (H) 08/05/2018   ALT 23 08/05/2018   AST 19 08/05/2018   NA 143 08/05/2018   K 4.3 08/05/2018   CL 107 (H) 08/05/2018   CREATININE 0.81 08/05/2018   BUN 21 08/05/2018   CO2 20 08/05/2018   TSH 2.030 02/06/2018   HGBA1C 5.5 08/05/2018    Ct Cardiac Scoring  Addendum Date: 01/07/2018   ADDENDUM REPORT: 01/07/2018 15:16 CLINICAL DATA:  Risk stratification EXAM: Coronary Calcium Score TECHNIQUE: The patient was scanned on a [employer] scanner. Axial non-contrast 3 mm slices were carried out through the heart. The data set was analyzed on a dedicated work station and scored using the [place]. FINDINGS: Non-cardiac: See separate  report from [facility]. Ascending Aorta: Normal size, trivial calcifications in the aortic root. Pericardium: Normal. Coronary arteries: Normal origin. IMPRESSION: Coronary calcium score of 0. This was 0 percentile for age and sex matched control. Electronically Signed   By: Ena Dawley   On: 01/07/2018 15:16   Result Date:  01/07/2018 EXAM: OVER-READ INTERPRETATION  CT CHEST The following report is an over-read performed by radiologist Dr. Rolm Baptise of [facility], [place] on 01/07/2018. This over-read does not include interpretation of cardiac or coronary anatomy or pathology. The coronary calcium score interpretation by the cardiologist is attached. COMPARISON:  None. FINDINGS: Vascular: Heart is normal size.  Visualized aorta is normal caliber. Mediastinum/Nodes: No adenopathy in the lower mediastinum or hila. Lungs/Pleura: Visualized lungs clear.  No effusions. Upper Abdomen: Imaging into the upper abdomen shows no acute findings. Musculoskeletal: Chest wall soft tissues are unremarkable. No acute bony abnormality. IMPRESSION: No acute or significant extra cardiac abnormality. Electronically Signed: By: Rolm Baptise M.D. On: 01/07/2018 14:46    Assessment & Plan:   There are no diagnoses linked to this encounter.   No orders of the defined types were placed in this encounter.    Follow-up: No follow-ups on file.  Walker Kehr, MD

## 2019-02-24 NOTE — Assessment & Plan Note (Signed)
Metformin 

## 2019-03-15 ENCOUNTER — Other Ambulatory Visit: Payer: Self-pay | Admitting: Internal Medicine

## 2019-03-17 ENCOUNTER — Telehealth: Payer: Self-pay | Admitting: *Deleted

## 2019-03-17 NOTE — Telephone Encounter (Signed)
A message was left, re: her follow up visit. 

## 2019-03-24 DIAGNOSIS — R7303 Prediabetes: Secondary | ICD-10-CM | POA: Insufficient documentation

## 2019-03-31 ENCOUNTER — Ambulatory Visit: Payer: Self-pay | Admitting: Cardiology

## 2019-03-31 NOTE — Progress Notes (Deleted)
Primary Care Provider: Cassandria Anger, MD Primary Cardiologist: Glenetta Hew, MD  Clinic Note: No chief complaint on file.    HPI:   Amy Lang is a 53 y.o. female with a strong family history of premature CAD (mother MI at age 22) with current risk factors of hypertension, hyperlipidemia (statin intolerance), & DM-2 below who presents today for annual f/u.  CRFs = metabolic Syndrome.  Amy Lang was last seen in August 2019 -a little frustrated with being diagnosed with borderline diabetes/hyperglycemia.  No cardiac symptoms of chest tightness or pressure.  Noted that she is not very active, but does try to get some exercise on her stationary bicycle.  Not routine.  She was trying to make conscious effort of diet adjustment. --> Coronary Calcium Score --> suggested Pravastain 20 mg as starting dose.  Recent Hospitalizations: none  Reviewed  CV studies:   The following studies were reviewed today: (if available, images/films reviewed: From Epic Chart or Care Everywhere) . Coronary Calcium Score = 0. LOW RISK.   Interval History:   ***  Cardiovascular review of symptoms: Cardiovascular ROS: {roscv:310661}  The patient {does/does not:200015} have symptoms concerning for COVID-19 infection (fever, chills, cough, or new shortness of breath).  The patient {ACTION; IS/IS [id number] practicing social distancing. *** Masking.  *** Groceries/shopping. *** Work   REVIEWED OF SYSTEMS   ROS: A comprehensive was performed. ROS   I have reviewed and (if needed) personally updated the patient's problem list, medications, allergies, past medical and surgical history, social and family history.   PAST MEDICAL HISTORY   Past Medical History:  Diagnosis Date  . Allergy   . Anxiety   . Asthma   . Depression   . Diabetes ([place])   . Dyslipidemia   . Hypertension   . IBS (irritable bowel syndrome)   . Lactose intolerance   . Migraines   . Obesity   . PONV  (postoperative nausea and vomiting)   . Shortness of breath   . Vaginal cancer ([place])   . Vitamin B 12 deficiency   . Vitamin D deficiency      PAST SURGICAL HISTORY   Past Surgical History:  Procedure Laterality Date  . ABDOMINAL HYSTERECTOMY     still has bilateral ovaries  . BREAST LUMPECTOMY WITH RADIOACTIVE SEED LOCALIZATION Right 08/28/2017   Procedure: BREAST LUMPECTOMY WITH RADIOACTIVE SEED LOCALIZATION;  Surgeon: Erroll Luna, MD;  Location: [facility];  Service: General;  Laterality: Right;  . CHOLECYSTECTOMY  04-04-11   single site  . COLONOSCOPY W/ BIOPSIES  01/24/2013  . ESOPHAGOGASTRODUODENOSCOPY    . KNEE SURGERY    . LAPAROTOMY  05/12/2012   Procedure: LAPAROTOMY;  Surgeon: Cyril Mourning, MD;  Location: [facility];  Service: Gynecology;;  laparotomy [name] salpingectomy-oopharectomy  . NM MYOCAR PERF WALL MOTION  09/06/2009   normal  . TRANSTHORACIC ECHOCARDIOGRAM  March 2014   Normal regional wall motion. EF 55%. Mild MR     MEDICATIONS/ALLERGIES   No outpatient medications have been marked as taking for the 03/31/19 encounter (Appointment) with Leonie Man, MD.    Allergies  Allergen Reactions  . Bee Pollen Anaphylaxis  . Butorphanol   . Butorphanol Tartrate Other (See Comments)    REACTION: hallucinations ( stadol)  . Codeine   . Codeine Sulfate Hives and Nausea And Vomiting  . Crestor [Rosuvastatin] Other (See Comments)    Body aches  . Darvocet [Propoxyphene N-Acetaminophen] Hives and Nausea And Vomiting  .  Estrogens   . Levofloxacin Other (See Comments)    abdominal pain  . Metformin And Related     diarrhea  . Penicillamine   . Penicillins Other (See Comments)    Seizure   . Sulfa Antibiotics Hives and Nausea And Vomiting  . Latex Rash    Blisters and redness with bandaids-also sensitivity to latex gloves     SOCIAL HISTORY/FAMILY HISTORY   Social History   Tobacco Use  . Smoking status: Never Smoker  .  Smokeless tobacco: Never Used  . Tobacco comment: Regular Exercise - No  Substance Use Topics  . Alcohol use: No  . Drug use: No   Social History   Social History Narrative   [name] with no children. Never smoked. Occasional alcohol.   Works as a Haematologist at [employer]. Is on her feet but 6 days per week.   She does a treadmill at home and walking outside of these 3-4 times a week.    family history includes Breast cancer (age of onset: 49) in her maternal grandmother; COPD in her mother; Diabetes in her mother; Heart attack (age of onset: 36) in her mother; Heart disease in her maternal grandfather, maternal grandmother, mother, paternal grandfather, and paternal grandmother; Hyperlipidemia in her father and mother; Hypertension in her father and mother; Kidney disease in her mother; Lung cancer in her father; Thyroid disease in her mother.   OBJCTIVE -PE, EKG, labs   Wt Readings from Last 3 Encounters:  02/24/19 164 lb (74.4 kg)  08/05/18 158 lb (71.7 kg)  07/08/18 158 lb (71.7 kg)    Physical Exam: There were no vitals taken for this visit. Physical Exam    Adult ECG Report  Rate: *** ;  Rhythm: {rhythm:17366};   Narrative Interpretation: ***    Recent Labs:  ***     ASSESSMENT/PLAN    Problem List Items Addressed This Visit    Obesity (BMI 30-39.9) - Primary (Chronic)   Essential hypertension (Chronic)   Hyperlipidemia (Chronic)   Hyperglycemia (Chronic)   Metabolic syndrome (Chronic)       COVID-19 Education: The signs and symptoms of COVID-19 were discussed with the patient and how to seek care for testing (follow up with PCP or arrange E-visit).   The importance of social distancing was discussed today.  I spent a total of ***minutes with the patient and chart review. >  50% of the time was spent in direct patient consultation.  Additional time spent with chart review (studies, outside notes, etc): *** Total Time: *** min    Current medicines are reviewed at length with the patient today.  (+/- concerns) ***   Patient Instructions / Medication Changes & Studies & Tests Ordered   There are no Patient Instructions on file for this visit.   Studies Ordered:   No orders of the defined types were placed in this encounter.    Glenetta Hew, M.D., M.S. Interventional Cardiologist   Pager # [phone] Phone # [phone] [address] [place], [place] [address]   Thank you for choosing [facility]care at [facility]!!

## 2019-05-15 ENCOUNTER — Other Ambulatory Visit: Payer: Self-pay | Admitting: Internal Medicine

## 2019-05-22 ENCOUNTER — Ambulatory Visit: Payer: HRSA Program | Attending: Internal Medicine

## 2019-05-22 DIAGNOSIS — Z20828 Contact with and (suspected) exposure to other viral communicable diseases: Secondary | ICD-10-CM | POA: Insufficient documentation

## 2019-05-22 DIAGNOSIS — Z20822 Contact with and (suspected) exposure to covid-19: Secondary | ICD-10-CM

## 2019-05-23 LAB — NOVEL CORONAVIRUS, NAA: SARS-CoV-2, NAA: NOT DETECTED

## 2019-06-09 LAB — HM DIABETES EYE EXAM

## 2019-06-23 ENCOUNTER — Other Ambulatory Visit: Payer: Self-pay

## 2019-06-23 ENCOUNTER — Ambulatory Visit: Payer: BC Managed Care – PPO | Admitting: Internal Medicine

## 2019-06-23 ENCOUNTER — Encounter: Payer: Self-pay | Admitting: Internal Medicine

## 2019-06-23 DIAGNOSIS — E538 Deficiency of other specified B group vitamins: Secondary | ICD-10-CM

## 2019-06-23 DIAGNOSIS — E559 Vitamin D deficiency, unspecified: Secondary | ICD-10-CM

## 2019-06-23 DIAGNOSIS — L723 Sebaceous cyst: Secondary | ICD-10-CM | POA: Insufficient documentation

## 2019-06-23 DIAGNOSIS — F331 Major depressive disorder, recurrent, moderate: Secondary | ICD-10-CM

## 2019-06-23 DIAGNOSIS — E119 Type 2 diabetes mellitus without complications: Secondary | ICD-10-CM

## 2019-06-23 DIAGNOSIS — G4700 Insomnia, unspecified: Secondary | ICD-10-CM | POA: Diagnosis not present

## 2019-06-23 DIAGNOSIS — F413 Other mixed anxiety disorders: Secondary | ICD-10-CM

## 2019-06-23 MED ORDER — VENLAFAXINE HCL ER 37.5 MG PO CP24: 37.5000 mg | ORAL_CAPSULE | Freq: Every day | ORAL | 3 refills | Status: DC

## 2019-06-23 MED ORDER — CYANOCOBALAMIN 1000 MCG/ML IJ SOLN: INTRAMUSCULAR | 3 refills | Status: DC

## 2019-06-23 MED ORDER — METFORMIN HCL 500 MG PO TABS: 500.0000 mg | ORAL_TABLET | Freq: Every day | ORAL | 3 refills | Status: DC

## 2019-06-23 NOTE — Assessment & Plan Note (Signed)
Worse 

## 2019-06-23 NOTE — Progress Notes (Signed)
Subjective:  Patient ID: Amy Lang, female    DOB: [birth year]  Age: 54 y.o. MRN: [id number]  CC: No chief complaint on file.   HPI Amy Lang presents for anxiety, depression, insomnia - worse.  Follow-up vitamin D B12 deficiency  Outpatient Medications Prior to Visit  Medication Sig Dispense Refill  . ALPRAZolam (XANAX) 0.25 MG tablet Take 1 tablet (0.25 mg total) by mouth 2 (two) times daily as needed for anxiety (panic attacks). 60 tablet 1  . aspirin EC 81 MG tablet Take 81 mg by mouth every evening.     . cetirizine (ZYRTEC) 10 MG tablet Take 10 mg by mouth daily.     . Cholecalciferol (VITAMIN D3) 50 MCG (2000 UT) capsule Take 1 capsule (2,000 Units total) by mouth daily. 100 capsule 3  . cyanocobalamin (,VITAMIN B-12,) 1000 MCG/ML injection INJECT 1 ML IM OR SUBQ EVERY 2 WEEKS (LIFETIME) 6 mL 3  . FLUoxetine (PROZAC) 40 MG capsule Take 1 capsule (40 mg total) by mouth daily. 90 capsule 1  . fluticasone (FLONASE) 50 MCG/ACT nasal spray Place 2 sprays into both nostrils daily. 16 g 6  . losartan (COZAAR) 100 MG tablet TAKE 1 TABLET BY MOUTH EVERY DAY 90 tablet 3  . Multiple Vitamin (MULTIVITAMIN WITH MINERALS) TABS Take 1 tablet by mouth daily.    . Multiple Vitamins-Minerals (PRESERVISION AREDS 2) CAPS Take 1 capsule by mouth at bedtime.    . Omega-3 Fatty Acids (FISH OIL) 1000 MG CAPS Take 1,000 mg by mouth daily.    . pantoprazole (PROTONIX) 20 MG tablet Take 1 tablet (20 mg total) by mouth daily before breakfast. 90 tablet 1  . Probiotic Product (ALIGN) 4 MG CAPS Take 1 capsule by mouth daily. 30 capsule 1  . STEGLATRO 5 MG TABS TAKE 1 TABLET EVERY DAY 30 tablet 11  . topiramate (TOPAMAX) 200 MG tablet Take 1 tablet (200 mg total) by mouth daily. 90 tablet 1  . VENTOLIN HFA 108 (90 Base) MCG/ACT inhaler TAKE 2 PUFFS BY MOUTH EVERY 6 HOURS AS NEEDED FOR WHEEZE OR SHORTNESS OF BREATH 18 Inhaler 0  . vitamin C (ASCORBIC ACID) 500 MG tablet Take 500 mg by mouth daily.       . vitamin E 1000 UNIT capsule Take 1,000 Units by mouth daily.    . metFORMIN (GLUCOPHAGE) 500 MG tablet Take 1 tablet (500 mg total) by mouth daily with breakfast. -- Office visit needed for further refills 30 tablet 0   No facility-administered medications prior to visit.    ROS: Review of Systems  Constitutional: Positive for fatigue. Negative for activity change, appetite change, chills and unexpected weight change.  HENT: Negative for congestion, mouth sores and sinus pressure.   Eyes: Negative for visual disturbance.  Respiratory: Negative for cough and chest tightness.   Gastrointestinal: Negative for abdominal pain and nausea.  Genitourinary: Negative for difficulty urinating, frequency and vaginal pain.  Musculoskeletal: Negative for back pain and gait problem.  Skin: Negative for pallor and rash.  Neurological: Negative for dizziness, tremors, weakness, numbness and headaches.  Psychiatric/Behavioral: Positive for dysphoric mood and sleep disturbance. Negative for confusion and suicidal ideas. The patient is nervous/anxious.     Objective:  BP 124/68 (BP Location: Left Arm, Patient Position: Sitting, Cuff Size: Large)   Pulse 63   Temp 98.4 F (36.9 C) (Oral)   Ht 5\' 1"  (1.549 m)   Wt 162 lb (73.5 kg)   SpO2 98%   BMI 30.61  kg/m   BP Readings from Last 3 Encounters:  06/23/19 124/68  02/24/19 132/84  08/05/18 113/69    Wt Readings from Last 3 Encounters:  06/23/19 162 lb (73.5 kg)  02/24/19 164 lb (74.4 kg)  08/05/18 158 lb (71.7 kg)    Physical Exam Constitutional:      General: She is not in acute distress.    Appearance: She is well-developed. She is obese.  HENT:     Head: Normocephalic.     Right Ear: External ear normal.     Left Ear: External ear normal.     Nose: Nose normal.  Eyes:     General:        Right eye: No discharge.        Left eye: No discharge.     Conjunctiva/sclera: Conjunctivae normal.     Pupils: Pupils are equal, round,  and reactive to light.  Neck:     Thyroid: No thyromegaly.     Vascular: No JVD.     Trachea: No tracheal deviation.  Cardiovascular:     Rate and Rhythm: Normal rate and regular rhythm.     Heart sounds: Normal heart sounds.  Pulmonary:     Effort: No respiratory distress.     Breath sounds: No stridor. No wheezing.  Abdominal:     General: Bowel sounds are normal. There is no distension.     Palpations: Abdomen is soft. There is no mass.     Tenderness: There is no abdominal tenderness. There is no guarding or rebound.  Musculoskeletal:        General: No tenderness.     Cervical back: Normal range of motion and neck supple.  Lymphadenopathy:     Cervical: No cervical adenopathy.  Skin:    Findings: No erythema or rash.  Neurological:     Cranial Nerves: No cranial nerve deficit.     Motor: No abnormal muscle tone.     Coordination: Coordination normal.     Deep Tendon Reflexes: Reflexes normal.  Psychiatric:        Behavior: Behavior normal.        Thought Content: Thought content normal.        Judgment: Judgment normal.     Lab Results  Component Value Date   WBC 5.8 08/05/2018   HGB 14.8 08/05/2018   HCT 44.9 08/05/2018   PLT 187.0 10/01/2017   GLUCOSE 95 08/05/2018   CHOL 186 08/05/2018   TRIG 172 (H) 08/05/2018   HDL 50 08/05/2018   LDLDIRECT 96.0 10/01/2017   LDLCALC 102 (H) 08/05/2018   ALT 23 08/05/2018   AST 19 08/05/2018   NA 143 08/05/2018   K 4.3 08/05/2018   CL 107 (H) 08/05/2018   CREATININE 0.81 08/05/2018   BUN 21 08/05/2018   CO2 20 08/05/2018   TSH 2.030 02/06/2018   HGBA1C 5.5 08/05/2018    CT CARDIAC SCORING  Addendum Date: 01/07/2018   ADDENDUM REPORT: 01/07/2018 15:16 CLINICAL DATA:  Risk stratification EXAM: Coronary Calcium Score TECHNIQUE: The patient was scanned on a [employer] scanner. Axial non-contrast 3 mm slices were carried out through the heart. The data set was analyzed on a dedicated work station and scored using the  [place]. FINDINGS: Non-cardiac: See separate report from [facility]. Ascending Aorta: Normal size, trivial calcifications in the aortic root. Pericardium: Normal. Coronary arteries: Normal origin. IMPRESSION: Coronary calcium score of 0. This was 0 percentile for age and sex matched control. Electronically Signed  By: Ena Dawley   On: 01/07/2018 15:16   Result Date: 01/07/2018 EXAM: OVER-READ INTERPRETATION  CT CHEST The following report is an over-read performed by radiologist Dr. Rolm Baptise of [facility], [place] on 01/07/2018. This over-read does not include interpretation of cardiac or coronary anatomy or pathology. The coronary calcium score interpretation by the cardiologist is attached. COMPARISON:  None. FINDINGS: Vascular: Heart is normal size.  Visualized aorta is normal caliber. Mediastinum/Nodes: No adenopathy in the lower mediastinum or hila. Lungs/Pleura: Visualized lungs clear.  No effusions. Upper Abdomen: Imaging into the upper abdomen shows no acute findings. Musculoskeletal: Chest wall soft tissues are unremarkable. No acute bony abnormality. IMPRESSION: No acute or significant extra cardiac abnormality. Electronically Signed: By: Rolm Baptise M.D. On: 01/07/2018 14:46    Assessment & Plan:   Diagnoses and all orders for this visit:  Moderate episode of recurrent major depressive disorder ([place])  Other mixed anxiety disorders  Insomnia, unspecified type  B12 deficiency  Type 2 diabetes mellitus without complication, without long-term current use of insulin (HCC) -     metFORMIN (GLUCOPHAGE) 500 MG tablet; Take 1 tablet (500 mg total) by mouth daily with breakfast. -- Office visit needed for further refills  Other orders -     venlafaxine XR (EFFEXOR XR) 37.5 MG 24 hr capsule; Take 1 capsule (37.5 mg total) by mouth daily with breakfast.     Meds ordered this encounter  Medications  . venlafaxine XR (EFFEXOR XR) 37.5 MG 24 hr capsule    Sig:  Take 1 capsule (37.5 mg total) by mouth daily with breakfast.    Dispense:  90 capsule    Refill:  3  . metFORMIN (GLUCOPHAGE) 500 MG tablet    Sig: Take 1 tablet (500 mg total) by mouth daily with breakfast. -- Office visit needed for further refills    Dispense:  90 tablet    Refill:  3     Follow-up: No follow-ups on file.  Walker Kehr, MD

## 2019-06-23 NOTE — Assessment & Plan Note (Addendum)
Worse Continue Prozac Add Effexor XR low dose Melatonin 5 mg Valerian root 1-2 at night

## 2019-06-23 NOTE — Assessment & Plan Note (Signed)
Vit D 

## 2019-06-23 NOTE — Patient Instructions (Addendum)
Melatonin 5 mg Valerian root 1-2 at night

## 2019-06-23 NOTE — Assessment & Plan Note (Signed)
Melatonin 5 mg Valerian root 1-2 at night

## 2019-06-23 NOTE — Assessment & Plan Note (Signed)
On B12 

## 2019-06-27 DIAGNOSIS — D225 Melanocytic nevi of trunk: Secondary | ICD-10-CM | POA: Diagnosis not present

## 2019-06-27 DIAGNOSIS — D0371 Melanoma in situ of right lower limb, including hip: Secondary | ICD-10-CM | POA: Diagnosis not present

## 2019-06-27 DIAGNOSIS — L820 Inflamed seborrheic keratosis: Secondary | ICD-10-CM | POA: Diagnosis not present

## 2019-06-27 DIAGNOSIS — Z85828 Personal history of other malignant neoplasm of skin: Secondary | ICD-10-CM | POA: Diagnosis not present

## 2019-06-27 DIAGNOSIS — L814 Other melanin hyperpigmentation: Secondary | ICD-10-CM | POA: Diagnosis not present

## 2019-06-27 DIAGNOSIS — L821 Other seborrheic keratosis: Secondary | ICD-10-CM | POA: Diagnosis not present

## 2019-07-04 ENCOUNTER — Encounter: Payer: Self-pay | Admitting: General Practice

## 2019-07-14 DIAGNOSIS — D0371 Melanoma in situ of right lower limb, including hip: Secondary | ICD-10-CM | POA: Diagnosis not present

## 2019-07-24 ENCOUNTER — Other Ambulatory Visit: Payer: Self-pay | Admitting: Obstetrics and Gynecology

## 2019-07-24 DIAGNOSIS — Z1231 Encounter for screening mammogram for malignant neoplasm of breast: Secondary | ICD-10-CM

## 2019-08-09 ENCOUNTER — Other Ambulatory Visit: Payer: Self-pay

## 2019-08-09 ENCOUNTER — Ambulatory Visit: Payer: BC Managed Care – PPO | Attending: Internal Medicine

## 2019-08-09 DIAGNOSIS — Z23 Encounter for immunization: Secondary | ICD-10-CM

## 2019-08-09 NOTE — Progress Notes (Signed)
   Covid-19 Vaccination Clinic  Name:  Amy Lang    MRN: [id number] DOB: [birth year]  08/09/2019  [name] was observed post Covid-19 immunization for 30 minutes based on pre-vaccination screening without incident. She was provided with Vaccine Information Sheet and instruction to access the V-Safe system.   [name] was instructed to call 911 with any severe reactions post vaccine: [name] Difficulty breathing  . Swelling of face and throat  . A fast heartbeat  . A bad rash all over body  . Dizziness and weakness   Immunizations Administered    Name Date Dose VIS Date Route   Pfizer COVID-19 Vaccine 08/09/2019  1:13 PM 0.3 mL 05/09/2019 Intramuscular   Manufacturer: [place]   Lot: [id number]   [place]: [id number]

## 2019-08-18 ENCOUNTER — Encounter: Payer: Self-pay | Admitting: Internal Medicine

## 2019-08-20 ENCOUNTER — Other Ambulatory Visit: Payer: Self-pay | Admitting: Internal Medicine

## 2019-08-23 ENCOUNTER — Other Ambulatory Visit: Payer: Self-pay | Admitting: Internal Medicine

## 2019-08-25 ENCOUNTER — Ambulatory Visit
Admission: RE | Admit: 2019-08-25 | Discharge: 2019-08-25 | Disposition: A | Discharge status: Home / Self Care | Payer: BC Managed Care – PPO | Source: Ambulatory Visit | Attending: Obstetrics and Gynecology | Admitting: Obstetrics and Gynecology

## 2019-08-25 ENCOUNTER — Other Ambulatory Visit: Payer: Self-pay

## 2019-08-25 DIAGNOSIS — Z1231 Encounter for screening mammogram for malignant neoplasm of breast: Secondary | ICD-10-CM

## 2019-08-26 ENCOUNTER — Other Ambulatory Visit: Payer: Self-pay | Admitting: Obstetrics and Gynecology

## 2019-08-26 DIAGNOSIS — N644 Mastodynia: Secondary | ICD-10-CM

## 2019-08-26 DIAGNOSIS — N63 Unspecified lump in unspecified breast: Secondary | ICD-10-CM

## 2019-09-01 ENCOUNTER — Ambulatory Visit: Payer: BC Managed Care – PPO | Admitting: Cardiology

## 2019-09-01 ENCOUNTER — Encounter: Payer: Self-pay | Admitting: Cardiology

## 2019-09-01 ENCOUNTER — Other Ambulatory Visit: Payer: Self-pay

## 2019-09-01 VITALS — BP 128/74 | HR 74 | Ht 61.0 in | Wt 162.6 lb

## 2019-09-01 DIAGNOSIS — E8881 Metabolic syndrome: Secondary | ICD-10-CM

## 2019-09-01 DIAGNOSIS — E782 Mixed hyperlipidemia: Secondary | ICD-10-CM | POA: Diagnosis not present

## 2019-09-01 DIAGNOSIS — I10 Essential (primary) hypertension: Secondary | ICD-10-CM

## 2019-09-01 DIAGNOSIS — Z8249 Family history of ischemic heart disease and other diseases of the circulatory system: Secondary | ICD-10-CM | POA: Diagnosis not present

## 2019-09-01 DIAGNOSIS — R739 Hyperglycemia, unspecified: Secondary | ICD-10-CM

## 2019-09-01 DIAGNOSIS — E669 Obesity, unspecified: Secondary | ICD-10-CM

## 2019-09-01 NOTE — Patient Instructions (Addendum)
Medication Instructions:  NO CHANGES  *If you need a refill on your cardiac medications before your next appointment, please call your pharmacy*   Lab Work: NOT NEEDED   Testing/Procedures: NOT NEEDED   Follow-Up: At [facility], you and your health needs are our priority.  As part of our continuing mission to provide you with exceptional heart care, we have created designated Provider Care Teams.  These Care Teams include your primary Cardiologist (physician) and Advanced Practice Providers (APPs -  Physician Assistants and Nurse Practitioners) who all work together to provide you with the care you need, when you need it.     Your next appointment:   24 month(s)  2023 The format for your next appointment:   In Person  Provider:   Glenetta Hew, MD   Other Instructions N/A

## 2019-09-01 NOTE — Progress Notes (Signed)
Primary Care Provider: Cassandria Anger, MD  Endocrinologist: Starlyn Skeans MD Cardiologist: No primary care provider on file. Electrophysiologist: None  Clinic Note: Chief Complaint  Patient presents with  . Follow-up    Plan had been 1.5-2-year follow-up  . Hypertension    Well-controlled  . Diabetes Mellitus    On new medicine    HPI:    Amy Lang is a 54 y.o. female with cardiac risk factors of hypertension, hyperlipidemia, obesity, DM-2 (essentially metabolic syndrome) with significant family history of premature CAD who presents today for roughly 1/2-year follow-up.Amy Lang was last seen in early August 2019.  Indicated that she would not have any cardiac symptoms.  No chest tightness pressure or dyspnea.  Exercising routinely on the stationary bicycle.  Had just started Metformin for diabetes.  Coronary calcium score ordered  Recent Hospitalizations: None  Reviewed  CV studies:    The following studies were reviewed today: (if available, images/films reviewed: From Epic Chart or Care Everywhere) . Coronary Calcium Score August 2019: Score 0.  Trivial aortic root calcification.  Interval History:   Amy Lang returns here today simply to maintain contact with cardiology.  She remains quite happy and healthy with no active angina symptoms.  She has not had any heart failure symptoms or palpitations.  Since I last saw her, she was started on ertugliflozin [place]) for diabetes.  For GI issues she has cut down her Metformin to 250 g a.m. dose and 500 mm p.o. dose.  She says that her blood pressures been pretty well controlled.  She had not started a statin, but was placed on co-Q10.  CV Review of Symptoms (Summary) Cardiovascular ROS: no chest pain or dyspnea on exertion negative for - edema, irregular heartbeat, orthopnea, palpitations, paroxysmal nocturnal dyspnea, rapid heart rate, shortness of breath or Syncope/near syncope, TIA/MR CS,  claudication.  The patient does not have symptoms concerning for COVID-19 infection (fever, chills, cough, or new shortness of breath).  The patient is practicing social distancing & Masking.   She has had step one of the [place] COVID-19 vaccine injection and is scheduled for the second booster shot on April 13.  REVIEWED OF SYSTEMS   Review of Systems  Constitutional: Positive for weight loss. Negative for malaise/fatigue.  HENT: Negative for congestion and nosebleeds.   Gastrointestinal: Negative for blood in stool and melena.  Genitourinary: Negative for hematuria.  Musculoskeletal: Positive for joint pain (Normal arthritis pains). Negative for falls.  Neurological: Negative for dizziness and focal weakness.  Psychiatric/Behavioral: Negative.      I have reviewed and (if needed) personally updated the patient's problem list, medications, allergies, past medical and surgical history, social and family history.   PAST MEDICAL HISTORY   Past Medical History:  Diagnosis Date  . Allergy   . Anxiety   . Asthma   . Depression   . Diabetes ([place])   . Dyslipidemia   . Hypertension   . IBS (irritable bowel syndrome)   . Lactose intolerance   . Migraines   . Obesity   . PONV (postoperative nausea and vomiting)   . Shortness of breath   . 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 Vitamin B 12 deficiency   . Vitamin D deficiency     PAST SURGICAL HISTORY   Past Surgical History:  Procedure Laterality Date  . ABDOMINAL HYSTERECTOMY     still has bilateral ovaries  . BREAST LUMPECTOMY WITH RADIOACTIVE SEED LOCALIZATION Right 08/28/2017  Procedure: BREAST LUMPECTOMY WITH RADIOACTIVE SEED LOCALIZATION;  Surgeon: Erroll Luna, MD;  Location: [facility];  Service: General;  Laterality: Right;  . CHOLECYSTECTOMY  04-04-11   single site  . COLONOSCOPY W/ BIOPSIES  01/24/2013  . ESOPHAGOGASTRODUODENOSCOPY    . KNEE SURGERY    . LAPAROTOMY  05/12/2012   Procedure: LAPAROTOMY;   Surgeon: Cyril Mourning, MD;  Location: [facility];  Service: Gynecology;;  laparotomy [name] salpingectomy-oopharectomy  . NM MYOCAR PERF WALL MOTION  09/06/2009   normal  . TRANSTHORACIC ECHOCARDIOGRAM  March 2014   Normal regional wall motion. EF 55%. Mild MR    MEDICATIONS/ALLERGIES   Current Meds  Medication Sig  . ALPRAZolam (XANAX) 0.25 MG tablet Take 1 tablet (0.25 mg total) by mouth 2 (two) times daily as needed for anxiety (panic attacks).  [name] aspirin EC 81 MG tablet Take 81 mg by mouth every evening.   . cetirizine (ZYRTEC) 10 MG tablet Take 10 mg by mouth daily.   . Cholecalciferol (VITAMIN D3) 50 MCG (2000 UT) capsule Take 1 capsule (2,000 Units total) by mouth daily.  . cyanocobalamin (,VITAMIN B-12,) 1000 MCG/ML injection INJECT 1 ML IM OR SUBQ EVERY 2 WEEKS (LIFETIME)  . FLUoxetine (PROZAC) 40 MG capsule TAKE 1 CAPSULE BY MOUTH EVERY DAY  . fluticasone (FLONASE) 50 MCG/ACT nasal spray Place 2 sprays into both nostrils daily.  [name] losartan (COZAAR) 50 MG tablet TAKE 2 TABLETS (100MG ) BY MOUTH EVERY DAY  . metFORMIN (GLUCOPHAGE) 500 MG tablet Take 1 tablet (500 mg total) by mouth daily with breakfast. -- Office visit needed for further refills  . metFORMIN (GLUCOPHAGE) 500 MG tablet Take by mouth daily with breakfast. Take 1/2 tablet (250mg )  . Multiple Vitamin (MULTIVITAMIN WITH MINERALS) TABS Take 1 tablet by mouth daily.  . Multiple Vitamins-Minerals (PRESERVISION AREDS 2) CAPS Take 1 capsule by mouth at bedtime.  . Omega-3 Fatty Acids (FISH OIL) 1000 MG CAPS Take 1,000 mg by mouth daily.  . pantoprazole (PROTONIX) 20 MG tablet Take 1 tablet (20 mg total) by mouth daily before breakfast.  . Probiotic Product (ALIGN) 4 MG CAPS Take 1 capsule by mouth daily.  [name] STEGLATRO 5 MG TABS TAKE 1 TABLET EVERY DAY  . topiramate (TOPAMAX) 200 MG tablet Take 1 tablet (200 mg total) by mouth daily.  [name] venlafaxine XR (EFFEXOR XR) 37.5 MG 24 hr capsule Take 1 capsule (37.5 mg total) by  mouth daily with breakfast.  . VENTOLIN HFA 108 (90 Base) MCG/ACT inhaler TAKE 2 PUFFS BY MOUTH EVERY 6 HOURS AS NEEDED FOR WHEEZE OR SHORTNESS OF BREATH  . vitamin C (ASCORBIC ACID) 500 MG tablet Take 500 mg by mouth daily.   . vitamin E 1000 UNIT capsule Take 1,000 Units by mouth daily.  . [DISCONTINUED] metFORMIN (GLUCOPHAGE) 500 MG tablet Take by mouth daily with breakfast. Take 1/2(250mg )tablet in the morning    Allergies  Allergen Reactions  . Bee Pollen Anaphylaxis  . Butorphanol   . Butorphanol Tartrate Other (See Comments)    REACTION: hallucinations ( stadol)  . Codeine   . Codeine Sulfate Hives and Nausea And Vomiting  . Crestor [Rosuvastatin] Other (See Comments)    Body aches  . Darvocet [Propoxyphene N-Acetaminophen] Hives and Nausea And Vomiting  . Estrogens   . Levofloxacin Other (See Comments)    abdominal pain  . Metformin And Related     diarrhea  . Penicillamine   . Penicillins Other (See Comments)    Seizure   .  Sulfa Antibiotics Hives and Nausea And Vomiting  . Latex Rash    Blisters and redness with bandaids-also sensitivity to latex gloves    SOCIAL HISTORY/FAMILY HISTORY   Reviewed in Epic:  Pertinent findings: No new changes  OBJCTIVE -PE, EKG, labs   Wt Readings from Last 3 Encounters:  09/01/19 162 lb 9.6 oz (73.8 kg)  06/23/19 162 lb (73.5 kg)  02/24/19 164 lb (74.4 kg)    Physical Exam: BP 128/74   Pulse 74   Ht 5\' 1"  (1.549 m)   Wt 162 lb 9.6 oz (73.8 kg)   SpO2 98%   BMI 30.72 kg/m  Physical Exam  Constitutional: She is oriented to person, place, and time. She appears well-developed and well-nourished. No distress.  HENT:  Head: Normocephalic and atraumatic.  Neck: No hepatojugular reflux and no JVD present. Carotid bruit is not present.  Cardiovascular: Normal rate, regular rhythm, normal heart sounds and intact distal pulses.  No extrasystoles are present. PMI is not displaced. Exam reveals no gallop and no friction rub.  No  murmur heard. Pulmonary/Chest: Effort normal and breath sounds normal. No respiratory distress. She has no wheezes. She has no rales.  Musculoskeletal:        General: No edema. Normal range of motion.     Cervical back: Normal range of motion and neck supple.  Neurological: She is alert and oriented to person, place, and time.  Psychiatric: She has a normal mood and affect. Her behavior is normal. Judgment and thought content normal.  Vitals reviewed.    Adult ECG Report  Rate: 74 ;  Rhythm: normal sinus rhythm and Normal axis, intervals and durations.;   Narrative Interpretation: Normal EKG.  Recent Labs:    Lab Results  Component Value Date   CHOL 186 08/05/2018   HDL 50 08/05/2018   LDLCALC 102 (H) 08/05/2018   LDLDIRECT 96.0 10/01/2017   TRIG 172 (H) 08/05/2018   CHOLHDL 4 11/19/2017   Lab Results  Component Value Date   CREATININE 0.81 08/05/2018   BUN 21 08/05/2018   NA 143 08/05/2018   K 4.3 08/05/2018   CL 107 (H) 08/05/2018   CO2 20 08/05/2018   Lab Results  Component Value Date   TSH 2.030 02/06/2018    ASSESSMENT/PLAN    Problem List Items Addressed This Visit    Obesity (BMI 30-39.9) (Chronic)    Barely obese.  Want to stem the tide and not gained any additional weight.  Need to continue to work on exercise and adjust dietary intake especially in light of diabetes.  With mild to modest weight loss, would expect potential improvement in glycemic control, blood pressure and lipid management as well.      Relevant Medications   metFORMIN (GLUCOPHAGE) 500 MG tablet   Essential hypertension (Chronic)    Blood pressure looks well controlled on current dose of losartan.  Appropriately chosen for diabetic.  No evidence of edema.  For now we can hold off on thiazide diuretic.      Relevant Orders   Lipid panel   Comprehensive metabolic panel   Hemoglobin A1c   EKG 12-Lead (Completed)   Hyperlipidemia (Chronic)    Should be due for new set of labs soon.   Her LDL is been roughly 100 which is pretty much goal for her.  She has not been on any medications.  The hope is that better glycemic control will help LDL.  Plan:  Continue co-Q10  Recheck lipid panel and  chemistry panel -> treat accordingly.  May consider red yeast rice if close.  (She is hoping to avoid statin)  With coronary calcium score of 0, we are not need to be overly aggressive and can target LDL less than 100.      Relevant Orders   Lipid panel   Hemoglobin A1c   Family history of coronary artery disease - Primary (Chronic)    Reassuring coronary calcium score results with a score of 0.  This means that we can be a little less aggressive with treating her lipid levels.  However very important to maintain adequate control of her other metabolic syndrome components including hypertension and diabetes.  Continue to stay active as this will be the barometer for need for any additional testing.      Relevant Orders   Comprehensive metabolic panel   Hemoglobin A1c   EKG 12-Lead (Completed)   Hyperglycemia (Chronic)   Relevant Orders   Lipid panel   Comprehensive metabolic panel   Hemoglobin [id number]   Metabolic syndrome (Chronic)       COVID-19 Education: The signs and symptoms of COVID-19 were discussed with the patient and how to seek care for testing (follow up with PCP or arrange E-visit).   The importance of social distancing was discussed today.  I spent a total of 49minutes with the patient. >  50% of the time was spent in direct patient consultation.  Additional time spent with chart review  / charting (studies, outside notes, etc): 8 Total Time: 26 min   Current medicines are reviewed at length with the patient today.  (+/- concerns) n/a  Notice: This dictation was prepared with Dragon dictation along with smaller phrase technology. Any transcriptional errors that result from this process are unintentional and may not be corrected upon review.  Patient  Instructions / Medication Changes & Studies & Tests Ordered   Patient Instructions  Medication Instructions:  NO CHANGES  *If you need a refill on your cardiac medications before your next appointment, please call your pharmacy*   Lab Work: NOT NEEDED   Testing/Procedures: NOT NEEDED   Follow-Up: At [facility], you and your health needs are our priority.  As part of our continuing mission to provide you with exceptional heart care, we have created designated Provider Care Teams.  These Care Teams include your primary Cardiologist (physician) and Advanced Practice Providers (APPs -  Physician Assistants and Nurse Practitioners) who all work together to provide you with the care you need, when you need it.     Your next appointment:   24 month(s)  2023 The format for your next appointment:   In Person  Provider:   Glenetta Hew, MD   Other Instructions N/A    Studies Ordered:   Orders Placed This Encounter  Procedures  . Lipid panel  . Comprehensive metabolic panel  . Hemoglobin A1c  . EKG 12-Lead     Glenetta Hew, M.D., M.S. Interventional Cardiologist   Pager # [phone] Phone # [phone] [address] [place], [place] [address]   Thank you for choosing Heartcare at [facility]!!

## 2019-09-02 ENCOUNTER — Telehealth: Payer: Self-pay | Admitting: Internal Medicine

## 2019-09-02 DIAGNOSIS — R5383 Other fatigue: Secondary | ICD-10-CM

## 2019-09-02 NOTE — Telephone Encounter (Signed)
    Patient calling, states Dr Ellyn Hack at [facility] has ordered labs to be drawn, would like to know if Dr Alain Marion would like to add any additional lab request, so she can get them done at the same time to avoid additional draws later.  Please advise

## 2019-09-03 ENCOUNTER — Encounter: Payer: Self-pay | Admitting: Cardiology

## 2019-09-03 NOTE — Assessment & Plan Note (Signed)
Blood pressure looks well controlled on current dose of losartan.  Appropriately chosen for diabetic.  No evidence of edema.  For now we can hold off on thiazide diuretic.

## 2019-09-03 NOTE — Telephone Encounter (Signed)
Notified pt MD will add TSH entered in computer.Marland KitchenJohny Chess

## 2019-09-03 NOTE — Telephone Encounter (Signed)
Please add TSH Thx

## 2019-09-03 NOTE — Assessment & Plan Note (Signed)
Reassuring coronary calcium score results with a score of 0.  This means that we can be a little less aggressive with treating her lipid levels.  However very important to maintain adequate control of her other metabolic syndrome components including hypertension and diabetes.  Continue to stay active as this will be the barometer for need for any additional testing.

## 2019-09-03 NOTE — Assessment & Plan Note (Signed)
Barely obese.  Want to stem the tide and not gained any additional weight.  Need to continue to work on exercise and adjust dietary intake especially in light of diabetes.  With mild to modest weight loss, would expect potential improvement in glycemic control, blood pressure and lipid management as well.

## 2019-09-03 NOTE — Assessment & Plan Note (Addendum)
Should be due for new set of labs soon.  Her LDL is been roughly 100 which is pretty much goal for her.  She has not been on any medications.  The hope is that better glycemic control will help LDL.  Plan:  Continue co-Q10  Recheck lipid panel and chemistry panel -> treat accordingly.  May consider red yeast rice if close.  (She is hoping to avoid statin)  With coronary calcium score of 0, we are not need to be overly aggressive and can target LDL less than 100.

## 2019-09-08 DIAGNOSIS — Z8249 Family history of ischemic heart disease and other diseases of the circulatory system: Secondary | ICD-10-CM | POA: Diagnosis not present

## 2019-09-08 DIAGNOSIS — R739 Hyperglycemia, unspecified: Secondary | ICD-10-CM | POA: Diagnosis not present

## 2019-09-08 DIAGNOSIS — E782 Mixed hyperlipidemia: Secondary | ICD-10-CM | POA: Diagnosis not present

## 2019-09-08 DIAGNOSIS — I10 Essential (primary) hypertension: Secondary | ICD-10-CM | POA: Diagnosis not present

## 2019-09-09 ENCOUNTER — Other Ambulatory Visit: Payer: Self-pay

## 2019-09-09 ENCOUNTER — Ambulatory Visit
Admission: RE | Admit: 2019-09-09 | Discharge: 2019-09-09 | Disposition: A | Discharge status: Home / Self Care | Payer: BC Managed Care – PPO | Source: Ambulatory Visit | Attending: Obstetrics and Gynecology | Admitting: Obstetrics and Gynecology

## 2019-09-09 ENCOUNTER — Ambulatory Visit: Payer: BC Managed Care – PPO | Attending: Internal Medicine

## 2019-09-09 DIAGNOSIS — N644 Mastodynia: Secondary | ICD-10-CM

## 2019-09-09 DIAGNOSIS — N63 Unspecified lump in unspecified breast: Secondary | ICD-10-CM

## 2019-09-09 DIAGNOSIS — Z23 Encounter for immunization: Secondary | ICD-10-CM

## 2019-09-09 DIAGNOSIS — R922 Inconclusive mammogram: Secondary | ICD-10-CM | POA: Diagnosis not present

## 2019-09-09 LAB — COMPREHENSIVE METABOLIC PANEL
ALT: 19 IU/L (ref 0–32)
AST: 20 IU/L (ref 0–40)
Albumin/Globulin Ratio: 1.9 (ref 1.2–2.2)
Albumin: 4.4 g/dL (ref 3.8–4.9)
Alkaline Phosphatase: 75 IU/L (ref 39–117)
BUN/Creatinine Ratio: 24 — ABNORMAL HIGH (ref 9–23)
BUN: 19 mg/dL (ref 6–24)
Bilirubin Total: 0.5 mg/dL (ref 0.0–1.2)
CO2: 19 mmol/L — ABNORMAL LOW (ref 20–29)
Calcium: 8.8 mg/dL (ref 8.7–10.2)
Chloride: 107 mmol/L — ABNORMAL HIGH (ref 96–106)
Creatinine, Ser: 0.78 mg/dL (ref 0.57–1.00)
GFR calc Af Amer: 99 mL/min/{1.73_m2} (ref >59)
GFR calc non Af Amer: 86 mL/min/{1.73_m2} (ref >59)
Globulin, Total: 2.3 g/dL (ref 1.5–4.5)
Glucose: 93 mg/dL (ref 65–99)
Potassium: 4.4 mmol/L (ref 3.5–5.2)
Sodium: 141 mmol/L (ref 134–144)
Total Protein: 6.7 g/dL (ref 6.0–8.5)

## 2019-09-09 LAB — LIPID PANEL
Chol/HDL Ratio: 3.5 ratio (ref 0.0–4.4)
Cholesterol, Total: 173 mg/dL (ref 100–199)
HDL: 50 mg/dL (ref >39)
LDL Chol Calc (NIH): 95 mg/dL (ref 0–99)
Triglycerides: 160 mg/dL — ABNORMAL HIGH (ref 0–149)
VLDL Cholesterol Cal: 28 mg/dL (ref 5–40)

## 2019-09-09 LAB — HEMOGLOBIN A1C
Est. average glucose Bld gHb Est-mCnc: 111 mg/dL
Hgb A1c MFr Bld: 5.5 % (ref 4.8–5.6)

## 2019-09-09 NOTE — Progress Notes (Signed)
   Covid-19 Vaccination Clinic  Name:  Amy Lang    MRN: [id number] DOB: April 26, 1966  09/09/2019  [name] was observed post Covid-19 immunization for 30 minutes based on pre-vaccination screening without incident. She was provided with Vaccine Information Sheet and instruction to access the V-Safe system.   [name] was instructed to call 911 with any severe reactions post vaccine: [name] Difficulty breathing  . Swelling of face and throat  . A fast heartbeat  . A bad rash all over body  . Dizziness and weakness   Immunizations Administered    Name Date Dose VIS Date Route   Pfizer COVID-19 Vaccine 09/09/2019  4:11 PM 0.3 mL 05/09/2019 Intramuscular   Manufacturer: [place]   Lot: [id number]   [place]: [id number]

## 2019-09-19 ENCOUNTER — Other Ambulatory Visit: Payer: Self-pay | Admitting: Internal Medicine

## 2019-11-16 ENCOUNTER — Other Ambulatory Visit: Payer: Self-pay | Admitting: Internal Medicine

## 2019-12-26 DIAGNOSIS — L814 Other melanin hyperpigmentation: Secondary | ICD-10-CM | POA: Diagnosis not present

## 2019-12-26 DIAGNOSIS — L821 Other seborrheic keratosis: Secondary | ICD-10-CM | POA: Diagnosis not present

## 2019-12-26 DIAGNOSIS — D2261 Melanocytic nevi of right upper limb, including shoulder: Secondary | ICD-10-CM | POA: Diagnosis not present

## 2019-12-26 DIAGNOSIS — Z85828 Personal history of other malignant neoplasm of skin: Secondary | ICD-10-CM | POA: Diagnosis not present

## 2019-12-26 DIAGNOSIS — D485 Neoplasm of uncertain behavior of skin: Secondary | ICD-10-CM | POA: Diagnosis not present

## 2020-01-19 DIAGNOSIS — M79642 Pain in left hand: Secondary | ICD-10-CM | POA: Diagnosis not present

## 2020-01-19 DIAGNOSIS — G5601 Carpal tunnel syndrome, right upper limb: Secondary | ICD-10-CM | POA: Diagnosis not present

## 2020-01-19 DIAGNOSIS — G5603 Carpal tunnel syndrome, bilateral upper limbs: Secondary | ICD-10-CM | POA: Diagnosis not present

## 2020-01-19 DIAGNOSIS — G5602 Carpal tunnel syndrome, left upper limb: Secondary | ICD-10-CM | POA: Diagnosis not present

## 2020-02-03 ENCOUNTER — Telehealth: Payer: Self-pay | Admitting: Internal Medicine

## 2020-02-03 NOTE — Telephone Encounter (Signed)
Patient called and said that the pharmacy called and told her that her insurance was not going to pay for her medication. She said that when Dr. Camila Li prescribed her the medication a few years ago he gave her a discount card and she was wondering if he was able to do that again. The medication is STEGLATRO 5 MG TABS.  She is wanting a call to let her know what she needs to do.

## 2020-02-05 NOTE — Telephone Encounter (Signed)
Informed pt that I will leave a GoodRx card up front for her to pick up

## 2020-02-12 ENCOUNTER — Other Ambulatory Visit: Payer: Self-pay | Admitting: Internal Medicine

## 2020-03-13 ENCOUNTER — Other Ambulatory Visit: Payer: Self-pay | Admitting: Internal Medicine

## 2020-03-21 ENCOUNTER — Other Ambulatory Visit: Payer: Self-pay | Admitting: Internal Medicine

## 2020-03-27 ENCOUNTER — Other Ambulatory Visit: Payer: Self-pay

## 2020-03-27 ENCOUNTER — Ambulatory Visit: Payer: BC Managed Care – PPO | Attending: Internal Medicine

## 2020-03-27 DIAGNOSIS — Z23 Encounter for immunization: Secondary | ICD-10-CM

## 2020-03-27 NOTE — Progress Notes (Signed)
   Covid-19 Vaccination Clinic  Name:  Amy Lang    MRN: [id number] DOB: July 01, 1965  03/27/2020  Amy Lang was observed post Covid-19 immunization for 30 minutes based on pre-vaccination screening without incident. She was provided with Vaccine Information Sheet and instruction to access the V-Safe system.   Amy Lang was instructed to call 911 with any severe reactions post vaccine: [name] Difficulty breathing  . Swelling of face and throat  . A fast heartbeat  . A bad rash all over body  . Dizziness and weakness

## 2020-04-02 DIAGNOSIS — L821 Other seborrheic keratosis: Secondary | ICD-10-CM | POA: Diagnosis not present

## 2020-04-02 DIAGNOSIS — Z85820 Personal history of malignant melanoma of skin: Secondary | ICD-10-CM | POA: Diagnosis not present

## 2020-04-02 DIAGNOSIS — Z85828 Personal history of other malignant neoplasm of skin: Secondary | ICD-10-CM | POA: Diagnosis not present

## 2020-04-02 DIAGNOSIS — L814 Other melanin hyperpigmentation: Secondary | ICD-10-CM | POA: Diagnosis not present

## 2020-04-15 DIAGNOSIS — M79641 Pain in right hand: Secondary | ICD-10-CM | POA: Diagnosis not present

## 2020-04-15 DIAGNOSIS — G5601 Carpal tunnel syndrome, right upper limb: Secondary | ICD-10-CM | POA: Diagnosis not present

## 2020-04-15 DIAGNOSIS — M79642 Pain in left hand: Secondary | ICD-10-CM | POA: Diagnosis not present

## 2020-04-15 DIAGNOSIS — G5602 Carpal tunnel syndrome, left upper limb: Secondary | ICD-10-CM | POA: Diagnosis not present

## 2020-04-18 ENCOUNTER — Other Ambulatory Visit: Payer: Self-pay | Admitting: Internal Medicine

## 2020-04-26 DIAGNOSIS — Z23 Encounter for immunization: Secondary | ICD-10-CM | POA: Diagnosis not present

## 2020-04-26 DIAGNOSIS — Z01419 Encounter for gynecological examination (general) (routine) without abnormal findings: Secondary | ICD-10-CM | POA: Diagnosis not present

## 2020-04-26 DIAGNOSIS — Z13820 Encounter for screening for osteoporosis: Secondary | ICD-10-CM | POA: Diagnosis not present

## 2020-04-26 DIAGNOSIS — Z6831 Body mass index (BMI) 31.0-31.9, adult: Secondary | ICD-10-CM | POA: Diagnosis not present

## 2020-07-19 ENCOUNTER — Other Ambulatory Visit: Payer: Self-pay | Admitting: Internal Medicine

## 2020-07-23 ENCOUNTER — Telehealth: Payer: Self-pay | Admitting: Internal Medicine

## 2020-07-23 NOTE — Telephone Encounter (Signed)
Patient states the  ertugliflozin L-PyroglutamicAc (STEGLATRO) 5 MG TABS tablet needs a PA, she has [place] now but is unable to pull in the system. She is going to send a message with that information. Patient has been out of medication for two weeks

## 2020-07-23 NOTE — Telephone Encounter (Signed)
Patients insurance information has been added and she will be uploading pictures of the card through [place].

## 2020-07-23 NOTE — Telephone Encounter (Signed)
Submitted PA via cover-my-medsw/ (Key: [id number]). Rec'd msg stating " Your information has been submitted to [facility] and is being reviewed. You may close this dialog, return to your dashboard, and perform other tasks. You will receive an electronic determination in CoverMyMeds within 72-120 hours"...Amy Lang

## 2020-07-27 ENCOUNTER — Telehealth: Payer: Self-pay | Admitting: Internal Medicine

## 2020-07-27 ENCOUNTER — Telehealth (INDEPENDENT_AMBULATORY_CARE_PROVIDER_SITE_OTHER): Payer: Managed Care, Other (non HMO) | Admitting: Family Medicine

## 2020-07-27 DIAGNOSIS — R059 Cough, unspecified: Secondary | ICD-10-CM

## 2020-07-27 DIAGNOSIS — R0981 Nasal congestion: Secondary | ICD-10-CM | POA: Diagnosis not present

## 2020-07-27 DIAGNOSIS — J4521 Mild intermittent asthma with (acute) exacerbation: Secondary | ICD-10-CM | POA: Diagnosis not present

## 2020-07-27 MED ORDER — FLUTICASONE PROPIONATE 50 MCG/ACT NA SUSP: 2.0000 | Freq: Every day | NASAL | 3 refills | Status: DC

## 2020-07-27 MED ORDER — BENZONATATE 100 MG PO CAPS: 100.0000 mg | ORAL_CAPSULE | Freq: Three times a day (TID) | ORAL | 0 refills | Status: DC | PRN

## 2020-07-27 MED ORDER — ALBUTEROL SULFATE HFA 108 (90 BASE) MCG/ACT IN AERS: 2.0000 | INHALATION_SPRAY | Freq: Four times a day (QID) | RESPIRATORY_TRACT | 0 refills | Status: DC | PRN

## 2020-07-27 NOTE — Telephone Encounter (Signed)
Patient had appointment today with Dr Maudie Mercury  Requesting refill for fluticasone (FLONASE) 50 MCG/ACT nasal spray  Pharmacy [id number] - [place], [place] - [place]

## 2020-07-27 NOTE — Progress Notes (Signed)
Virtual Visit via Video Note  I connected with Amy Lang  on 07/27/20 at 11:20 AM EST by a video enabled telemedicine application and verified that I am speaking with the correct person using two identifiers.  Location patient: home, [place] Location provider:work or home office Persons participating in the virtual visit: patient, provider  I discussed the limitations of evaluation and management by telemedicine and the availability of in person appointments. The patient expressed understanding and agreed to proceed.   HPI:  Acute telemedicine visit for : -Onset: 3-4 days ago -Symptoms include: sneezing, sore throat, coughing, sinus pressure, teeth hurt, ears hurt, chills, body aches, a little wheezy at times - alb inhaler is expired has rarely required -home covid test negative yesterday morning -Denies:CP, SOB, NVD, inability to eat/drink/get out of bed, sick exposures -Has tried: zyrtec -Pertinent past medical history:HTN, asthma - seasonal, allergies, DM -Pertinent medication allergies: codeine, penicillin, darvocet, sulfa, levoflocacin - abd pain, crestor, metformin -COVID-19 vaccine status: fully vaccinated for covid + had booster; had flu shot  ROS: See pertinent positives and negatives per HPI.  Past Medical History:  Diagnosis Date  . Allergy   . Anxiety   . Asthma   . Depression   . Diabetes ([place])   . Dyslipidemia   . Hypertension   . IBS (irritable bowel syndrome)   . Lactose intolerance   . Migraines   . Obesity   . PONV (postoperative nausea and vomiting)   . Shortness of breath   . Vaginal cancer ([place])   . Vitamin B 12 deficiency   . Vitamin D deficiency     Past Surgical History:  Procedure Laterality Date  . ABDOMINAL HYSTERECTOMY     still has bilateral ovaries  . BREAST EXCISIONAL BIOPSY Right 08/27/2017  . BREAST LUMPECTOMY WITH RADIOACTIVE SEED LOCALIZATION Right 08/28/2017   Procedure: BREAST LUMPECTOMY WITH RADIOACTIVE SEED LOCALIZATION;  Surgeon: Erroll Luna, MD;  Location: [facility];  Service: General;  Laterality: Right;  . CHOLECYSTECTOMY  04-04-11   single site  . COLONOSCOPY W/ BIOPSIES  01/24/2013  . ESOPHAGOGASTRODUODENOSCOPY    . KNEE SURGERY    . LAPAROTOMY  05/12/2012   Procedure: LAPAROTOMY;  Surgeon: Cyril Mourning, MD;  Location: [facility];  Service: Gynecology;;  laparotomy [name] salpingectomy-oopharectomy  . NM MYOCAR PERF WALL MOTION  09/06/2009   normal  . TRANSTHORACIC ECHOCARDIOGRAM  March 2014   Normal regional wall motion. EF 55%. Mild MR     Current Outpatient Medications:  .  albuterol (PROAIR HFA) 108 (90 Base) MCG/ACT inhaler, Inhale 2 puffs into the lungs every 6 (six) hours as needed for wheezing or shortness of breath., Disp: 1 each, Rfl: 0 .  benzonatate (TESSALON PERLES) 100 MG capsule, Take 1 capsule (100 mg total) by mouth 3 (three) times daily as needed., Disp: 20 capsule, Rfl: 0 .  ALPRAZolam (XANAX) 0.25 MG tablet, Take 1 tablet (0.25 mg total) by mouth 2 (two) times daily as needed for anxiety (panic attacks)., Disp: 60 tablet, Rfl: 1 .  aspirin EC 81 MG tablet, Take 81 mg by mouth every evening. , Disp: , Rfl:  .  cetirizine (ZYRTEC) 10 MG tablet, Take 10 mg by mouth daily. , Disp: , Rfl:  .  Cholecalciferol (VITAMIN D3) 50 MCG (2000 UT) capsule, Take 1 capsule (2,000 Units total) by mouth daily., Disp: 100 capsule, Rfl: 3 .  cyanocobalamin (,VITAMIN B-12,) 1000 MCG/ML injection, INJECT 1 ML IM OR SUBQ EVERY 2 WEEKS (LIFETIME), Disp:  6 mL, Rfl: 3 .  ertugliflozin L-PyroglutamicAc (STEGLATRO) 5 MG TABS tablet, Take 1 tablet (5 mg total) by mouth daily. Overdue for Follow-up appt must see provider for future refills, Disp: 30 tablet, Rfl: 0 .  FLUoxetine (PROZAC) 40 MG capsule, TAKE 1 CAPSULE BY MOUTH EVERY DAY, Disp: 90 capsule, Rfl: 1 .  fluticasone (FLONASE) 50 MCG/ACT nasal spray, Place 2 sprays into both nostrils daily., Disp: 16 g, Rfl: 6 .  losartan (COZAAR) 50 MG tablet, TAKE 2  TABLETS (100MG ) BY MOUTH EVERY DAY, Disp: 180 tablet, Rfl: 3 .  metFORMIN (GLUCOPHAGE) 500 MG tablet, Take 1 tablet (500 mg total) by mouth daily with breakfast. -- Office visit needed for further refills, Disp: 90 tablet, Rfl: 3 .  metFORMIN (GLUCOPHAGE) 500 MG tablet, Take by mouth daily with breakfast. Take 1/2 tablet (250mg ), Disp: , Rfl:  .  Multiple Vitamin (MULTIVITAMIN WITH MINERALS) TABS, Take 1 tablet by mouth daily., Disp: , Rfl:  .  Multiple Vitamins-Minerals (PRESERVISION AREDS 2) CAPS, Take 1 capsule by mouth at bedtime., Disp: , Rfl:  .  Omega-3 Fatty Acids (FISH OIL) 1000 MG CAPS, Take 1,000 mg by mouth daily., Disp: , Rfl:  .  pantoprazole (PROTONIX) 20 MG tablet, TAKE 1 TABLET (20 MG TOTAL) BY MOUTH DAILY BEFORE BREAKFAST., Disp: 90 tablet, Rfl: 1 .  Probiotic Product (ALIGN) 4 MG CAPS, Take 1 capsule by mouth daily., Disp: 30 capsule, Rfl: 1 .  topiramate (TOPAMAX) 200 MG tablet, Take 0.5 tablets (100 mg total) by mouth daily. Overdue for Annual appt must see provider for future refills, Disp: 30 tablet, Rfl: 0 .  venlafaxine XR (EFFEXOR XR) 37.5 MG 24 hr capsule, Take 1 capsule (37.5 mg total) by mouth daily with breakfast., Disp: 90 capsule, Rfl: 3 .  vitamin C (ASCORBIC ACID) 500 MG tablet, Take 500 mg by mouth daily. , Disp: , Rfl:  .  vitamin E 1000 UNIT capsule, Take 1,000 Units by mouth daily., Disp: , Rfl:   EXAM:  VITALS per patient if applicable:  GENERAL: alert, oriented, appears well and in no acute distress  HEENT: atraumatic, conjunttiva clear, no obvious abnormalities on inspection of external nose and ears  NECK: normal movements of the head and neck  LUNGS: on inspection no signs of respiratory distress, breathing rate appears normal, no obvious gross SOB, gasping or wheezing  CV: no obvious cyanosis  MS: moves all visible extremities without noticeable abnormality  PSYCH/NEURO: pleasant and cooperative, no obvious depression or anxiety, speech and  thought processing grossly intact  ASSESSMENT AND PLAN:  Discussed the following assessment and plan:  Nasal congestion  Cough  Mild intermittent asthma with acute exacerbation  -we discussed possible serious and likely etiologies, options for evaluation and workup, limitations of telemedicine visit vs in person visit, treatment, treatment risks and precautions. Pt prefers to treat via telemedicine empirically rather than in person at this moment.  Query viral upper respiratory illness, COVID-19, influenza versus other.  May  be having an asthma exacerbation.  Discussed treatment options, treatment windows for Covid and flu, symptomatic care measures and asthma management.  She opted for albuterol refill in case this is needed, Tessalon for cough, symptomatic care measures summarized in patient instructions and Covid testing.  Advised staying home while sick and per protocol of Covid. Work/School slipped offered: provided in patient instructions  Scheduled follow up with PCP offered: She agrees to schedule follow-up if needed with her primary care office. Advised to seek prompt follow-up video  visit through her primary care office or in person care if worsening, any breathing issues not frequent inhaler use, new symptoms arise, or if is not improving with treatment. Discussed options for inperson care if PCP office not available. Did let this patient know that I only do telemedicine on Tuesdays and Thursdays for [place]. Advised to schedule follow up visit with PCP or [facility] if any further questions or concerns to avoid delays in care.   I discussed the assessment and treatment plan with the patient. The patient was provided an opportunity to ask questions and all were answered. The patient agreed with the plan and demonstrated an understanding of the instructions.     Lucretia Kern, DO

## 2020-07-27 NOTE — Telephone Encounter (Signed)
Reviewed chart pt is up-to-date sent refills to [facility].../lmb  

## 2020-07-27 NOTE — Patient Instructions (Addendum)
---------------------------------------------------------------------------------------------------------------------------    WORK SLIP:  Patient Amy Lang,  1965-11-25, was seen for a medical visit today, 07/27/20 . Please excuse from work for a COVID like illness. We advise 10 days minimum from the onset of symptoms (07/23/20) PLUS 1 day of no fever and improved symptoms. Will defer to employer for a sooner return to work if has negative covid test and symptoms have resolved OR  it is greater than 5 days since the positive test, symptoms have resolved and the patient can wear a high-quality, tight fitting mask such as N95 or KN95 at all times for an additional 5 days. Would also suggest COVID19 antigen testing is negative prior to return.  Sincerely: E-signature: Dr. Colin Benton, DO [place] Ph: [phone]   ------------------------------------------------------------------------------------------------------------------------------   HOME CARE TIPS:  -[facility] testing information: [email or web address]/ OR [phone] Most pharmacies also offer testing and home test kits. Please schedule a prompt follow up video visit through your primary care office or through [facility] if the covid test is positive and you are interested in treatment. Treatment works best if initiated in the first 5-7 days of illness.   -I sent the medication(s) we discussed to your pharmacy: Meds ordered this encounter  Medications  . benzonatate (TESSALON PERLES) 100 MG capsule    Sig: Take 1 capsule (100 mg total) by mouth 3 (three) times daily as needed.    Dispense:  20 capsule    Refill:  0  . albuterol (PROAIR HFA) 108 (90 Base) MCG/ACT inhaler    Sig: Inhale 2 puffs into the lungs every 6 (six) hours as needed for wheezing or shortness of breath.    Dispense:  1 each    Refill:  0     -can use tylenol  if needed for  fevers, aches and pains per instructions  -can use nasal saline a few times per day if you have nasal congestion; sometimes  a short course of Afrin nasal spray for 3 days can help with symptoms as well  -stay hydrated, drink plenty of fluids and eat small healthy meals - avoid dairy  -can take 1000 IU (33mcg) Vit D3 and 100-500 mg of Vit C daily per instructions  -If the Covid test is positive, check out the CDC website for more information on home care, transmission and treatment for COVID19  -follow up with your doctor in 2-3 days unless improving and feeling better  -stay home while sick, except to seek medical care, and if you have [place] ideally it would be best to stay home for a full 10 days since the onset of symptoms PLUS one day of no fever and feeling better. Wear a good mask (such as N95 or KN95) if around others to reduce the risk of transmission.  It was nice to meet you today, and I really hope you are feeling better soon. I help [facility] out with telemedicine visits on Tuesdays and Thursdays and am available for visits on those days. If you have any concerns or questions following this visit please schedule a follow up visit with your Primary Care doctor or seek care at a local urgent care clinic to avoid delays in care.    Seek in person care or schedule a follow up video visit promptly if your symptoms worsen, new concerns arise or you are not improving with treatment. Call 911 and/or seek emergency care if your symptoms are severe or life threatening.

## 2020-07-28 NOTE — Telephone Encounter (Signed)
Rec'd PA back med was denied it states pt must have tried and failed both [place]. There is no indication that she has tried these two, or any contraindication.Marland KitchenJohny Lang

## 2020-07-30 MED ORDER — DAPAGLIFLOZIN PROPANEDIOL 5 MG PO TABS: 5.0000 mg | ORAL_TABLET | Freq: Every day | ORAL | 1 refills | Status: DC

## 2020-07-30 NOTE — Telephone Encounter (Signed)
We will substitute for [place].  Please schedule office visit Thanks

## 2020-07-30 NOTE — Addendum Note (Signed)
Addended by: Cassandria Anger on: 07/30/2020 01:29 PM   Modules accepted: Orders

## 2020-07-30 NOTE — Telephone Encounter (Signed)
Called pt there was no answer LMOM w/MD response../lmb 

## 2020-08-03 NOTE — Telephone Encounter (Signed)
Patient called and said she did not want to take [place]. She said she did not like the side effects she read about the medication. She was wondering if there is a discount card for ertugliflozin L-PyroglutamicAc (STEGLATRO) 5 MG TABS tablet. Her last OV was 1.25.21. Please advise. She can be reached at [phone]

## 2020-08-06 ENCOUNTER — Other Ambulatory Visit: Payer: Self-pay | Admitting: Internal Medicine

## 2020-08-06 MED ORDER — STEGLATRO 5 MG PO TABS: 5.0000 mg | ORAL_TABLET | Freq: Every day | ORAL | 3 refills | Status: DC

## 2020-08-06 NOTE — Telephone Encounter (Signed)
Called pt inform her will leave copay card up front for pick-up. Also for her to look into Pt assistance program w/manufactuer...Johny Chess

## 2020-08-06 NOTE — Addendum Note (Signed)
Addended by: Earnstine Regal on: 08/06/2020 11:56 AM   Modules accepted: Orders

## 2020-08-17 ENCOUNTER — Other Ambulatory Visit: Payer: Self-pay | Admitting: Family Medicine

## 2020-09-01 ENCOUNTER — Telehealth: Payer: Self-pay | Admitting: Internal Medicine

## 2020-09-01 NOTE — Telephone Encounter (Addendum)
Its for 04.13.22 in [place]  #[phone] Juror# [id number]

## 2020-09-01 NOTE — Telephone Encounter (Signed)
Patient wondering if Dr. Alain Marion would be willing to write her out of jury duty next week due to her medical conditions.

## 2020-09-01 NOTE — Telephone Encounter (Signed)
Called pt there was no answer lmom MD ok letter will need just # along w/the date she supposed to have duty.Marland KitchenJohny Chess

## 2020-09-01 NOTE — Telephone Encounter (Signed)
Ok, pls write a letter Thx

## 2020-09-02 NOTE — Telephone Encounter (Signed)
Generated letter.. called pt LMOM letter is on mychart.Marland KitchenJohny Lang

## 2020-09-13 ENCOUNTER — Other Ambulatory Visit: Payer: Self-pay | Admitting: Internal Medicine

## 2020-09-15 ENCOUNTER — Other Ambulatory Visit: Payer: Self-pay | Admitting: Internal Medicine

## 2020-09-24 ENCOUNTER — Telehealth: Payer: Self-pay | Admitting: Internal Medicine

## 2020-10-12 ENCOUNTER — Other Ambulatory Visit: Payer: Self-pay | Admitting: Internal Medicine

## 2020-10-12 MED ORDER — VENLAFAXINE HCL ER 37.5 MG PO CP24: 37.5000 mg | ORAL_CAPSULE | Freq: Every day | ORAL | 0 refills | Status: DC

## 2020-10-12 NOTE — Telephone Encounter (Signed)
Per office policy sent 30 day to local pharmacy until appt.../lmb  

## 2020-10-12 NOTE — Addendum Note (Signed)
Addended by: Earnstine Regal on: 10/12/2020 03:28 PM   Modules accepted: Orders

## 2020-10-12 NOTE — Telephone Encounter (Signed)
Patient called and is requesting a refill for venlafaxine XR (EFFEXOR-XR) 37.5 MG 24 hr capsule. She is scheduled for a CPE on 11-15-20. Please advise. It can be sent to [id number] - [place], [place] - [address].

## 2020-10-14 ENCOUNTER — Telehealth: Payer: Self-pay | Admitting: Internal Medicine

## 2020-10-14 NOTE — Telephone Encounter (Signed)
  Patient requesting a return call Seeking advice She reports coughing , sore throat, history of asthma, She is seeking advice regarding cough. No virtual appointments available

## 2020-10-14 NOTE — Telephone Encounter (Signed)
For a mild COVID-19 case you can take zinc 50 mg a day for 1 week, vitamin C 1000 mg daily for 1 week, vitamin D2 50,000 units weekly for 2 months (unless you are taking vitamin D daily already). Take Allegra or Benadryl.  Maintain good oral hydration and take Tylenol for high fever.   VOV w/any provider Isolate x 5 d, re-test in 5 d  Thx

## 2020-10-14 NOTE — Telephone Encounter (Signed)
Patient tested positive for covid, she is wondering what the protocol is and how long she should quarantine and when she should take another test because she needs a negative test to go back to work.

## 2020-10-15 ENCOUNTER — Encounter: Payer: Self-pay | Admitting: Family

## 2020-10-15 ENCOUNTER — Telehealth (INDEPENDENT_AMBULATORY_CARE_PROVIDER_SITE_OTHER): Payer: Managed Care, Other (non HMO) | Admitting: Family

## 2020-10-15 ENCOUNTER — Other Ambulatory Visit: Payer: Self-pay

## 2020-10-15 VITALS — Temp 99.4°F | Ht 60.0 in | Wt 165.0 lb

## 2020-10-15 DIAGNOSIS — U071 COVID-19: Secondary | ICD-10-CM | POA: Diagnosis not present

## 2020-10-15 MED ORDER — BENZONATATE 100 MG PO CAPS: 100.0000 mg | ORAL_CAPSULE | Freq: Three times a day (TID) | ORAL | 0 refills | Status: DC | PRN

## 2020-10-15 MED ORDER — NIRMATRELVIR/RITONAVIR (PAXLOVID)TABLET: 3.0000 | ORAL_TABLET | Freq: Two times a day (BID) | ORAL | 0 refills | Status: AC

## 2020-10-15 NOTE — Telephone Encounter (Signed)
Called pt there was no answer LMOM w/MD response../lmb 

## 2020-10-15 NOTE — Progress Notes (Signed)
Amy Lang is a 55 y.o. female with the following history as recorded in EpicCare:  Patient Active Problem List   Diagnosis Date Noted  . Insomnia disorder 06/23/2019  . Sebaceous cyst 06/23/2019  . Acute sinusitis 03/18/2018  . Other fatigue 02/06/2018  . Exercise-induced asthma 02/06/2018  . Type 2 diabetes mellitus without complication, without long-term current use of insulin ([place]) 02/06/2018  . Vitamin D deficiency 02/06/2018  . Metabolic syndrome 56/38/7564  . Hyperglycemia 11/19/2017  . Well adult exam 09/11/2016  . Adenopathy 01/13/2015  . Trigeminal neuralgia of left side of face 12/07/2014  . Abdominal pain, epigastric 05/14/2014  . Nausea with vomiting 05/14/2014  . Eye exam abnormal, was told she had bled behind eye 08/22/2013  . Obesity (BMI 30-39.9) 02/17/2013  . Dizziness 02/17/2013  . Family history of coronary artery disease 02/17/2013  . IBS (irritable bowel syndrome)   . Essential hypertension   . Hyperlipidemia   . Diarrhea - suspect IBS 12/23/2012  . Hematochezia 12/23/2012  . B12 deficiency 04/15/2012  . Hot flashes 04/15/2012  . ALLERGIC RHINITIS 12/02/2009  . Asthma 12/02/2009  . Anxiety disorder 06/30/2009  . Depression 06/30/2009  . LIPOMAS, MULTIPLE 06/29/2009  . GRIEF REACTION 06/29/2009  . Migraine 06/29/2009  . DYSPNEA 06/29/2009    Current Outpatient Medications  Medication Sig Dispense Refill  . albuterol (PROAIR HFA) 108 (90 Base) MCG/ACT inhaler Inhale 2 puffs into the lungs every 6 (six) hours as needed for wheezing or shortness of breath. 1 each 0  . ALPRAZolam (XANAX) 0.25 MG tablet Take 1 tablet (0.25 mg total) by mouth 2 (two) times daily as needed for anxiety (panic attacks). 60 tablet 1  . aspirin EC 81 MG tablet Take 81 mg by mouth every evening.    . benzonatate (TESSALON) 100 MG capsule Take 1 capsule (100 mg total) by mouth 3 (three) times daily as needed. 20 capsule 0  . cetirizine (ZYRTEC) 10 MG tablet Take 10 mg by mouth  daily.    . Cholecalciferol (VITAMIN D3) 50 MCG (2000 UT) capsule Take 1 capsule (2,000 Units total) by mouth daily. 100 capsule 3  . cyanocobalamin (,VITAMIN B-12,) 1000 MCG/ML injection INJECT 1 ML IM OR SUBQ EVERY 2 WEEKS (LIFETIME) 6 mL 3  . ertugliflozin L-PyroglutamicAc (STEGLATRO) 5 MG TABS tablet Take 1 tablet (5 mg total) by mouth daily. 30 tablet 3  . FLUoxetine (PROZAC) 40 MG capsule TAKE 1 CAPSULE BY MOUTH EVERY DAY 90 capsule 1  . fluticasone (FLONASE) 50 MCG/ACT nasal spray Place 2 sprays into both nostrils daily. 16 g 3  . losartan (COZAAR) 50 MG tablet TAKE 2 TABLETS (100MG ) BY MOUTH EVERY DAY 180 tablet 1  . metFORMIN (GLUCOPHAGE) 500 MG tablet Take 1 tablet (500 mg total) by mouth daily with breakfast. -- Office visit needed for further refills 90 tablet 3  . Multiple Vitamin (MULTIVITAMIN WITH MINERALS) TABS Take 1 tablet by mouth daily.    . Multiple Vitamins-Minerals (PRESERVISION AREDS 2) CAPS Take 1 capsule by mouth at bedtime.    . nirmatrelvir/ritonavir EUA (PAXLOVID) TABS Take 3 tablets by mouth 2 (two) times daily for 5 days. Patient GFR is 86. Take nirmatrelvir (150 mg) two tablets twice daily for 5 days and ritonavir (100 mg) one tablet twice daily for 5 days. 30 tablet 0  . Omega-3 Fatty Acids (FISH OIL) 1000 MG CAPS Take 1,000 mg by mouth daily.    . pantoprazole (PROTONIX) 20 MG tablet Take 1 tablet (20 mg  total) by mouth daily before breakfast. Overdue for Annual appt must see provider for future refills 30 tablet 0  . Probiotic Product (ALIGN) 4 MG CAPS Take 1 capsule by mouth daily. 30 capsule 1  . topiramate (TOPAMAX) 200 MG tablet Take 0.5 tablets (100 mg total) by mouth daily. Overdue for Annual appt must see provider for future refills 30 tablet 0  . venlafaxine XR (EFFEXOR-XR) 37.5 MG 24 hr capsule TAKE 1 CAPSULE BY MOUTH DAILY WITH BREAKFAST. 30 capsule 0  . vitamin C (ASCORBIC ACID) 500 MG tablet Take 500 mg by mouth daily.    . vitamin E 1000 UNIT capsule  Take 1,000 Units by mouth daily.    . Zinc 50 MG CAPS Take by mouth.     No current facility-administered medications for this visit.    Allergies: Bee pollen, Butorphanol, Butorphanol tartrate, Codeine, Codeine sulfate, Crestor [rosuvastatin], Darvocet [propoxyphene n-acetaminophen], Estrogens, Levofloxacin, Metformin and related, Penicillamine, Penicillins, Sulfa antibiotics, and Latex  Past Medical History:  Diagnosis Date  . Allergy   . Anxiety   . Asthma   . Depression   . Diabetes ([place])   . Dyslipidemia   . Hypertension   . IBS (irritable bowel syndrome)   . Lactose intolerance   . Migraines   . Obesity   . PONV (postoperative nausea and vomiting)   . Shortness of breath   . Vaginal cancer ([place])   . Vitamin B 12 deficiency   . Vitamin D deficiency     Past Surgical History:  Procedure Laterality Date  . ABDOMINAL HYSTERECTOMY     still has bilateral ovaries  . BREAST EXCISIONAL BIOPSY Right 08/27/2017  . BREAST LUMPECTOMY WITH RADIOACTIVE SEED LOCALIZATION Right 08/28/2017   Procedure: BREAST LUMPECTOMY WITH RADIOACTIVE SEED LOCALIZATION;  Surgeon: Erroll Luna, MD;  Location: [facility];  Service: General;  Laterality: Right;  . CHOLECYSTECTOMY  04-04-11   single site  . COLONOSCOPY W/ BIOPSIES  01/24/2013  . ESOPHAGOGASTRODUODENOSCOPY    . KNEE SURGERY    . LAPAROTOMY  05/12/2012   Procedure: LAPAROTOMY;  Surgeon: Cyril Mourning, MD;  Location: [facility];  Service: Gynecology;;  laparotomy [name] salpingectomy-oopharectomy  . NM MYOCAR PERF WALL MOTION  09/06/2009   normal  . TRANSTHORACIC ECHOCARDIOGRAM  March 2014   Normal regional wall motion. EF 55%. Mild MR    Family History  Problem Relation Age of Onset  . Heart disease Mother   . COPD Mother   . Heart attack Mother 32  . Diabetes Mother   . Hypertension Mother   . Hyperlipidemia Mother   . Kidney disease Mother   . Thyroid disease Mother   . Lung cancer Father   . Hypertension  Father   . Hyperlipidemia Father   . Heart disease Maternal Grandmother   . Breast cancer Maternal Grandmother 25  . Heart disease Maternal Grandfather   . Heart disease Paternal Grandmother   . Heart disease Paternal Grandfather   . Colon cancer Neg Hx   . Esophageal cancer Neg Hx   . Rectal cancer Neg Hx   . Stomach cancer Neg Hx     Social History   Tobacco Use  . Smoking status: Never Smoker  . Smokeless tobacco: Never Used  . Tobacco comment: Regular Exercise - No  Substance Use Topics  . Alcohol use: No    Subjective:   I connected with [name] on 10/15/20 at  3:40 PM EDT by a video enabled telemedicine  application and verified that I am speaking with the correct person using two identifiers.   I discussed the limitations of evaluation and management by telemedicine and the availability of in person appointments. The patient expressed understanding and agreed to proceed.  Started with cough/ congestion on Tuesday; notes that Wednesday she did test + for COVID; denies any chest pain or shortness of breath but is concerned about her cough due to underlying asthma; is having fevers- typically at night- around 100-101; using her albuterol and Flonase; would be interested in trying anti-viral; is taking Zinc, Vitamin C and D;     Objective:  Vitals:   10/15/20 1526  Temp: 99.4 F (37.4 C)  TempSrc: Oral  Weight: 165 lb (74.8 kg)  Height: 5' (1.524 m)    General: Well developed, well nourished, in no acute distress  Skin : Warm and dry.  Head: Normocephalic and atraumatic  Eyes: Sclera and conjunctiva clear; pupils round and reactive to light; extraocular movements intact  Ears: External normal; canals clear; tympanic membranes normal  Oropharynx: Pink, supple. No suspicious lesions  Neck: Supple without thyromegaly, adenopathy  Lungs: Respirations unlabored;  Neurologic: Alert and oriented; speech intact; face symmetrical;   Assessment:  1. COVID-19      Plan:  Rx for Paxlovid- use as directed; Rx for Tessalon Perles 100 mg tid prn; increase fluids,rest; strict ER precautions for upcoming weekend; she will follow up with her PCP next week if cough does not improve- to consider antibiotic and/or CXR.  No follow-ups on file.  No orders of the defined types were placed in this encounter.   Requested Prescriptions   Signed Prescriptions Disp Refills  . nirmatrelvir/ritonavir EUA (PAXLOVID) TABS 30 tablet 0    Sig: Take 3 tablets by mouth 2 (two) times daily for 5 days. Patient GFR is 86. Take nirmatrelvir (150 mg) two tablets twice daily for 5 days and ritonavir (100 mg) one tablet twice daily for 5 days.  . benzonatate (TESSALON) 100 MG capsule 20 capsule 0    Sig: Take 1 capsule (100 mg total) by mouth 3 (three) times daily as needed.

## 2020-10-16 ENCOUNTER — Other Ambulatory Visit: Payer: Self-pay | Admitting: Internal Medicine

## 2020-10-22 ENCOUNTER — Other Ambulatory Visit: Payer: Self-pay | Admitting: Internal Medicine

## 2020-11-01 ENCOUNTER — Encounter: Payer: Self-pay | Admitting: Internal Medicine

## 2020-11-01 LAB — HM DIABETES EYE EXAM

## 2020-11-08 ENCOUNTER — Encounter: Payer: Self-pay | Admitting: Internal Medicine

## 2020-11-15 ENCOUNTER — Encounter: Payer: Self-pay | Admitting: Internal Medicine

## 2020-11-15 ENCOUNTER — Other Ambulatory Visit: Payer: Self-pay

## 2020-11-15 ENCOUNTER — Ambulatory Visit (INDEPENDENT_AMBULATORY_CARE_PROVIDER_SITE_OTHER): Payer: Managed Care, Other (non HMO) | Admitting: Internal Medicine

## 2020-11-15 VITALS — BP 140/78 | HR 64 | Temp 98.4°F | Ht 61.0 in | Wt 163.2 lb

## 2020-11-15 DIAGNOSIS — R739 Hyperglycemia, unspecified: Secondary | ICD-10-CM | POA: Diagnosis not present

## 2020-11-15 DIAGNOSIS — E559 Vitamin D deficiency, unspecified: Secondary | ICD-10-CM | POA: Diagnosis not present

## 2020-11-15 DIAGNOSIS — E119 Type 2 diabetes mellitus without complications: Secondary | ICD-10-CM | POA: Diagnosis not present

## 2020-11-15 DIAGNOSIS — E782 Mixed hyperlipidemia: Secondary | ICD-10-CM

## 2020-11-15 DIAGNOSIS — E538 Deficiency of other specified B group vitamins: Secondary | ICD-10-CM | POA: Diagnosis not present

## 2020-11-15 DIAGNOSIS — I10 Essential (primary) hypertension: Secondary | ICD-10-CM | POA: Diagnosis not present

## 2020-11-15 DIAGNOSIS — Z0000 Encounter for general adult medical examination without abnormal findings: Secondary | ICD-10-CM

## 2020-11-15 LAB — CBC WITH DIFFERENTIAL/PLATELET
Basophils Absolute: 0 10*3/uL (ref 0.0–0.1)
Basophils Relative: 0.3 % (ref 0.0–3.0)
Eosinophils Absolute: 0 10*3/uL (ref 0.0–0.7)
Eosinophils Relative: 0.1 % (ref 0.0–5.0)
HCT: 44.8 % (ref 36.0–46.0)
Hemoglobin: 15.2 g/dL — ABNORMAL HIGH (ref 12.0–15.0)
Lymphocytes Relative: 32.7 % (ref 12.0–46.0)
Lymphs Abs: 1.9 10*3/uL (ref 0.7–4.0)
MCHC: 33.9 g/dL (ref 30.0–36.0)
MCV: 89.9 fl (ref 78.0–100.0)
Monocytes Absolute: 0.5 10*3/uL (ref 0.1–1.0)
Monocytes Relative: 8.6 % (ref 3.0–12.0)
Neutro Abs: 3.3 10*3/uL (ref 1.4–7.7)
Neutrophils Relative %: 58.3 % (ref 43.0–77.0)
Platelets: 164 10*3/uL (ref 150.0–400.0)
RBC: 4.99 Mil/uL (ref 3.87–5.11)
RDW: 13.3 % (ref 11.5–15.5)
WBC: 5.7 10*3/uL (ref 4.0–10.5)

## 2020-11-15 LAB — URINALYSIS
Bilirubin Urine: NEGATIVE
Hgb urine dipstick: NEGATIVE
Ketones, ur: NEGATIVE
Leukocytes,Ua: NEGATIVE
Nitrite: NEGATIVE
Specific Gravity, Urine: >=1.03 — AB (ref 1.000–1.030)
Total Protein, Urine: NEGATIVE
Urine Glucose: 500 — AB
Urobilinogen, UA: 0.2 (ref 0.0–1.0)
pH: 5.5 (ref 5.0–8.0)

## 2020-11-15 LAB — COMPREHENSIVE METABOLIC PANEL
ALT: 22 U/L (ref 0–35)
AST: 21 U/L (ref 0–37)
Albumin: 4.6 g/dL (ref 3.5–5.2)
Alkaline Phosphatase: 67 U/L (ref 39–117)
BUN: 19 mg/dL (ref 6–23)
CO2: 25 mEq/L (ref 19–32)
Calcium: 9.3 mg/dL (ref 8.4–10.5)
Chloride: 106 mEq/L (ref 96–112)
Creatinine, Ser: 0.73 mg/dL (ref 0.40–1.20)
GFR: 91.51 mL/min (ref >60.00)
Glucose, Bld: 96 mg/dL (ref 70–99)
Potassium: 3.8 mEq/L (ref 3.5–5.1)
Sodium: 139 mEq/L (ref 135–145)
Total Bilirubin: 0.6 mg/dL (ref 0.2–1.2)
Total Protein: 7.5 g/dL (ref 6.0–8.3)

## 2020-11-15 LAB — HEMOGLOBIN A1C: Hgb A1c MFr Bld: 5.8 % (ref 4.6–6.5)

## 2020-11-15 LAB — LIPID PANEL
Cholesterol: 187 mg/dL (ref 0–200)
HDL: 54.6 mg/dL (ref >39.00)
LDL Cholesterol: 93 mg/dL (ref 0–99)
NonHDL: 132.1
Total CHOL/HDL Ratio: 3
Triglycerides: 195 mg/dL — ABNORMAL HIGH (ref 0.0–149.0)
VLDL: 39 mg/dL (ref 0.0–40.0)

## 2020-11-15 LAB — VITAMIN B12: Vitamin B-12: 493 pg/mL (ref 211–911)

## 2020-11-15 LAB — MICROALBUMIN / CREATININE URINE RATIO
Creatinine,U: 132.4 mg/dL
Microalb Creat Ratio: 0.7 mg/g (ref 0.0–30.0)
Microalb, Ur: 0.9 mg/dL (ref 0.0–1.9)

## 2020-11-15 LAB — TSH: TSH: 1.81 u[IU]/mL (ref 0.35–4.50)

## 2020-11-15 MED ORDER — FLUOXETINE HCL 40 MG PO CAPS: ORAL_CAPSULE | ORAL | 3 refills | Status: DC

## 2020-11-15 MED ORDER — LOSARTAN POTASSIUM 50 MG PO TABS: ORAL_TABLET | ORAL | 3 refills | Status: DC

## 2020-11-15 MED ORDER — METFORMIN HCL ER 500 MG PO TB24: 500.0000 mg | ORAL_TABLET | Freq: Every day | ORAL | 3 refills | Status: DC

## 2020-11-15 MED ORDER — ALPRAZOLAM 0.25 MG PO TABS: 0.2500 mg | ORAL_TABLET | Freq: Two times a day (BID) | ORAL | 1 refills | Status: DC | PRN

## 2020-11-15 MED ORDER — FLUTICASONE PROPIONATE 50 MCG/ACT NA SUSP: NASAL | 3 refills | Status: AC

## 2020-11-15 MED ORDER — STEGLATRO 5 MG PO TABS: 5.0000 mg | ORAL_TABLET | Freq: Every day | ORAL | 3 refills | Status: DC

## 2020-11-15 NOTE — Assessment & Plan Note (Signed)
Losartan 

## 2020-11-15 NOTE — Addendum Note (Signed)
Addended by: Jacobo Forest on: 11/15/2020 10:19 AM   Modules accepted: Orders

## 2020-11-15 NOTE — Assessment & Plan Note (Addendum)
Check A1c Metformin 500 mg XR a day Coronary CT calcium score of 0 in 2019

## 2020-11-15 NOTE — Assessment & Plan Note (Signed)
On Vit D 

## 2020-11-15 NOTE — Progress Notes (Signed)
Subjective:  Patient ID: Amy Lang, female    DOB: Dec 28, 1965  Age: 55 y.o. MRN: [id number]  CC: Annual Exam   HPI Amy Lang presents for post - COVID L  back pain F/u HTN, DM, anxiety Well exam   Outpatient Medications Prior to Visit  Medication Sig Dispense Refill   albuterol (PROAIR HFA) 108 (90 Base) MCG/ACT inhaler Inhale 2 puffs into the lungs every 6 (six) hours as needed for wheezing or shortness of breath. 1 each 0   ALPRAZolam (XANAX) 0.25 MG tablet Take 1 tablet (0.25 mg total) by mouth 2 (two) times daily as needed for anxiety (panic attacks). 60 tablet 1   aspirin EC 81 MG tablet Take 81 mg by mouth every evening.     benzonatate (TESSALON) 100 MG capsule Take 1 capsule (100 mg total) by mouth 3 (three) times daily as needed. 20 capsule 0   cetirizine (ZYRTEC) 10 MG tablet Take 10 mg by mouth daily.     Cholecalciferol (VITAMIN D3) 50 MCG (2000 UT) capsule Take 1 capsule (2,000 Units total) by mouth daily. 100 capsule 3   cyanocobalamin (,VITAMIN B-12,) 1000 MCG/ML injection INJECT 1 ML IM OR SUBQ EVERY 2 WEEKS (LIFETIME) 6 mL 3   ertugliflozin L-PyroglutamicAc (STEGLATRO) 5 MG TABS tablet Take 1 tablet (5 mg total) by mouth daily. 30 tablet 3   FLUoxetine (PROZAC) 40 MG capsule TAKE 1 CAPSULE BY MOUTH EVERY DAY 90 capsule 1   fluticasone (FLONASE) 50 MCG/ACT nasal spray SPRAY 2 SPRAYS INTO EACH NOSTRIL EVERY DAY 48 mL 1   losartan (COZAAR) 50 MG tablet TAKE 2 TABLETS (100MG ) BY MOUTH EVERY DAY 180 tablet 1   metFORMIN (GLUCOPHAGE) 500 MG tablet Take 1 tablet (500 mg total) by mouth daily with breakfast. -- Office visit needed for further refills 90 tablet 3   Multiple Vitamin (MULTIVITAMIN WITH MINERALS) TABS Take 1 tablet by mouth daily.     Multiple Vitamins-Minerals (PRESERVISION AREDS 2) CAPS Take 1 capsule by mouth at bedtime.     Omega-3 Fatty Acids (FISH OIL) 1000 MG CAPS Take 1,000 mg by mouth daily.     pantoprazole (PROTONIX) 20 MG tablet TAKE 1 TABLET  BY MOUTH DAILY BEFORE BREAKFAST. OVERDUE FOR ANNUAL APPT MUST SEE DR REFILLS 30 tablet 3   Probiotic Product (ALIGN) 4 MG CAPS Take 1 capsule by mouth daily. 30 capsule 1   topiramate (TOPAMAX) 200 MG tablet Take 0.5 tablets (100 mg total) by mouth daily. Overdue for Annual appt must see provider for future refills 30 tablet 0   venlafaxine XR (EFFEXOR-XR) 37.5 MG 24 hr capsule TAKE 1 CAPSULE BY MOUTH DAILY WITH BREAKFAST. 30 capsule 0   vitamin C (ASCORBIC ACID) 500 MG tablet Take 500 mg by mouth daily.     vitamin E 1000 UNIT capsule Take 1,000 Units by mouth daily.     Zinc 50 MG CAPS Take by mouth.     No facility-administered medications prior to visit.    ROS: Review of Systems  Constitutional:  Negative for activity change, appetite change, chills, fatigue and unexpected weight change.  HENT:  Negative for congestion, mouth sores and sinus pressure.   Eyes:  Negative for visual disturbance.  Respiratory:  Positive for cough. Negative for chest tightness.   Gastrointestinal:  Negative for abdominal pain and nausea.  Genitourinary:  Negative for difficulty urinating, frequency and vaginal pain.  Musculoskeletal:  Positive for back pain. Negative for gait problem.  Skin:  Negative  for pallor and rash.  Neurological:  Negative for dizziness, tremors, weakness, numbness and headaches.  Psychiatric/Behavioral:  Negative for confusion and sleep disturbance.    Objective:  BP 140/78 (BP Location: Left Arm)   Pulse 64   Temp 98.4 F (36.9 C) (Oral)   Ht 5\' 1"  (1.549 m)   Wt 163 lb 3.2 oz (74 kg)   SpO2 96%   BMI 30.84 kg/m   BP Readings from Last 3 Encounters:  11/15/20 140/78  09/01/19 128/74  06/23/19 124/68    Wt Readings from Last 3 Encounters:  11/15/20 163 lb 3.2 oz (74 kg)  10/15/20 165 lb (74.8 kg)  09/01/19 162 lb 9.6 oz (73.8 kg)    Physical Exam Constitutional:      General: She is not in acute distress.    Appearance: She is well-developed. She is obese.   HENT:     Head: Normocephalic.     Right Ear: External ear normal.     Left Ear: External ear normal.     Nose: Nose normal.  Eyes:     General:        Right eye: No discharge.        Left eye: No discharge.     Conjunctiva/sclera: Conjunctivae normal.     Pupils: Pupils are equal, round, and reactive to light.  Neck:     Thyroid: No thyromegaly.     Vascular: No JVD.     Trachea: No tracheal deviation.  Cardiovascular:     Rate and Rhythm: Normal rate and regular rhythm.     Heart sounds: Normal heart sounds.  Pulmonary:     Effort: No respiratory distress.     Breath sounds: No stridor. No wheezing.  Abdominal:     General: Bowel sounds are normal. There is no distension.     Palpations: Abdomen is soft. There is no mass.     Tenderness: There is no abdominal tenderness. There is no guarding or rebound.  Musculoskeletal:        General: Tenderness present.     Cervical back: Normal range of motion and neck supple. No rigidity.  Lymphadenopathy:     Cervical: No cervical adenopathy.  Skin:    Findings: No erythema or rash.  Neurological:     Cranial Nerves: No cranial nerve deficit.     Motor: No abnormal muscle tone.     Coordination: Coordination normal.     Deep Tendon Reflexes: Reflexes normal.  Psychiatric:        Behavior: Behavior normal.        Thought Content: Thought content normal.        Judgment: Judgment normal.    Lab Results  Component Value Date   WBC 5.8 08/05/2018   HGB 14.8 08/05/2018   HCT 44.9 08/05/2018   PLT 187.0 10/01/2017   GLUCOSE 93 09/08/2019   CHOL 173 09/08/2019   TRIG 160 (H) 09/08/2019   HDL 50 09/08/2019   LDLDIRECT 96.0 10/01/2017   LDLCALC 95 09/08/2019   ALT 19 09/08/2019   AST 20 09/08/2019   NA 141 09/08/2019   K 4.4 09/08/2019   CL 107 (H) 09/08/2019   CREATININE 0.78 09/08/2019   BUN 19 09/08/2019   CO2 19 (L) 09/08/2019   TSH 2.030 02/06/2018   HGBA1C 5.5 09/08/2019    US BREAST LTD UNI RIGHT INC  AXILLA  Result Date: 09/09/2019 CLINICAL DATA:  55 year old female with a palpable area of concern in the right  breast as well as an area of pain in the right breast. History of benign excision for papilloma 08/28/2017. EXAM: DIGITAL DIAGNOSTIC BILATERAL MAMMOGRAM WITH CAD AND TOMO RIGHT BREAST ULTRASOUND COMPARISON:  PREVIOUS EXAMS. ACR Breast Density Category c: The breast tissue is heterogeneously dense, which may obscure small masses. FINDINGS: No suspicious masses or calcifications are seen in either breast. Postsurgical changes are present within the retroareolar right breast related to interval excision of a papilloma. Spot compression tangential tomograms were performed over the palpable area of concern in the right breast with no definite abnormality seen. Mammographic images were processed with CAD. Physical examination of the upper right breast in the region of concern reveals a mobile area of thickening. Targeted ultrasound of the right breast was performed. No suspicious masses or abnormality seen, only normal-appearing fibroglandular tissue identified. This palpable area of concern is felt to be related to a prominent fat lobule in this location. IMPRESSION: 1. No suspicious abnormality seen at the palpable area of concern in the upper right breast. This palpable area of concern is felt to be related to prominent fat lobule in this location. 2.  No mammographic evidence of malignancy in either breast. RECOMMENDATION: 1. Recommend further management of right breast pain and right breast palpable area of concern be based on clinical assessment. 2.  Screening mammogram in one year.(Code:SM-B-01Y) I have discussed the findings and recommendations with the patient. If applicable, a reminder letter will be sent to the patient regarding the next appointment. BI-RADS CATEGORY  1: Negative. Electronically Signed   By: Everlean Alstrom M.D.   On: 09/09/2019 08:25   MM DIAG BREAST TOMO BILATERAL  Result  Date: 09/09/2019 CLINICAL DATA:  55 year old female with a palpable area of concern in the right breast as well as an area of pain in the right breast. History of benign excision for papilloma 08/28/2017. EXAM: DIGITAL DIAGNOSTIC BILATERAL MAMMOGRAM WITH CAD AND TOMO RIGHT BREAST ULTRASOUND COMPARISON:  PREVIOUS EXAMS. ACR Breast Density Category c: The breast tissue is heterogeneously dense, which may obscure small masses. FINDINGS: No suspicious masses or calcifications are seen in either breast. Postsurgical changes are present within the retroareolar right breast related to interval excision of a papilloma. Spot compression tangential tomograms were performed over the palpable area of concern in the right breast with no definite abnormality seen. Mammographic images were processed with CAD. Physical examination of the upper right breast in the region of concern reveals a mobile area of thickening. Targeted ultrasound of the right breast was performed. No suspicious masses or abnormality seen, only normal-appearing fibroglandular tissue identified. This palpable area of concern is felt to be related to a prominent fat lobule in this location. IMPRESSION: 1. No suspicious abnormality seen at the palpable area of concern in the upper right breast. This palpable area of concern is felt to be related to prominent fat lobule in this location. 2.  No mammographic evidence of malignancy in either breast. RECOMMENDATION: 1. Recommend further management of right breast pain and right breast palpable area of concern be based on clinical assessment. 2.  Screening mammogram in one year.(Code:SM-B-01Y) I have discussed the findings and recommendations with the patient. If applicable, a reminder letter will be sent to the patient regarding the next appointment. BI-RADS CATEGORY  1: Negative. Electronically Signed   By: Everlean Alstrom M.D.   On: 09/09/2019 08:25    Assessment & Plan:     Walker Kehr, MD

## 2020-11-15 NOTE — Assessment & Plan Note (Signed)
  We discussed age appropriate health related issues, including available/recomended screening tests and vaccinations. Labs were ordered to be later reviewed . All questions were answered. We discussed one or more of the following - seat belt use, use of sunscreen/sun exposure exercise, safe sex, fall risk reduction, second hand smoke exposure, firearm use and storage, seat belt use, a need for adhering to healthy diet and exercise. Labs were ordered.  All questions were answered. tDAP next year

## 2020-11-15 NOTE — Assessment & Plan Note (Signed)
Check B12 On Vit B12

## 2020-11-15 NOTE — Assessment & Plan Note (Signed)
Coronary CT calcium score of 0 in 2019 

## 2020-11-15 NOTE — Assessment & Plan Note (Signed)
Metformin 500 mg every morning - change to XR

## 2020-11-17 ENCOUNTER — Other Ambulatory Visit: Payer: Self-pay | Admitting: Internal Medicine

## 2020-11-17 DIAGNOSIS — Z1231 Encounter for screening mammogram for malignant neoplasm of breast: Secondary | ICD-10-CM

## 2020-11-26 ENCOUNTER — Other Ambulatory Visit: Payer: Self-pay | Admitting: Internal Medicine

## 2020-11-26 NOTE — Telephone Encounter (Signed)
1.Medication Requested: topiramate (TOPAMAX) 200 MG tablet  90 day supply   2. Pharmacy (Name, Street, [place]): [id number] - [address], [place]  3. On Med List: yes   4. Last Visit with PCP: 11-15-20  5. Next visit date with PCP: n/a    Agent: Please be advised that RX refills may take up to 3 business days. We ask that you follow-up with your pharmacy.

## 2020-11-26 NOTE — Telephone Encounter (Signed)
Rx has been sent pof.Marland KitchenJohny Chess

## 2020-12-20 ENCOUNTER — Other Ambulatory Visit: Payer: Self-pay | Admitting: Internal Medicine

## 2020-12-28 ENCOUNTER — Telehealth: Payer: Self-pay | Admitting: *Deleted

## 2020-12-28 NOTE — Telephone Encounter (Signed)
-----   Message from Marguarite Arbour sent at 12/27/2020  9:25 AM EDT ----- PA required

## 2020-12-28 NOTE — Telephone Encounter (Signed)
Rec'd PA for [employer] completed via cover-my-meds w/ Key # [id number].PA Case ID: [id number] - Rx #: [id number]. Rec'd msg stating " [facility] is processing your inquiry and will respond shortly with next steps. You are currently using the fastest method to process this request.".Marland KitchenJohny Chess

## 2021-01-06 NOTE — Telephone Encounter (Signed)
-----   Message from Marguarite Arbour sent at 12/27/2020  9:25 AM EDT ----- PA required

## 2021-01-06 NOTE — Telephone Encounter (Signed)
Rec'd msg back stating " This request has received an Unfavorable outcome.The information did not meet the criteria necessary to approve this medication. Pt must have tried and failed : [place].Marland KitchenJohny Chess

## 2021-01-07 MED ORDER — EMPAGLIFLOZIN 10 MG PO TABS: 10.0000 mg | ORAL_TABLET | Freq: Every day | ORAL | 3 refills | Status: DC

## 2021-01-07 NOTE — Telephone Encounter (Signed)
[place] Thx

## 2021-01-15 ENCOUNTER — Other Ambulatory Visit: Payer: Self-pay | Admitting: Internal Medicine

## 2021-01-17 ENCOUNTER — Other Ambulatory Visit: Payer: Self-pay

## 2021-01-17 ENCOUNTER — Ambulatory Visit
Admission: RE | Admit: 2021-01-17 | Discharge: 2021-01-17 | Disposition: A | Discharge status: Home / Self Care | Payer: Managed Care, Other (non HMO) | Source: Ambulatory Visit | Attending: Internal Medicine | Admitting: Internal Medicine

## 2021-01-17 DIAGNOSIS — Z1231 Encounter for screening mammogram for malignant neoplasm of breast: Secondary | ICD-10-CM

## 2021-01-21 ENCOUNTER — Other Ambulatory Visit: Payer: Self-pay | Admitting: Internal Medicine

## 2021-02-12 ENCOUNTER — Other Ambulatory Visit: Payer: Self-pay | Admitting: Internal Medicine

## 2021-04-01 DIAGNOSIS — M722 Plantar fascial fibromatosis: Secondary | ICD-10-CM | POA: Insufficient documentation

## 2021-04-01 DIAGNOSIS — M7670 Peroneal tendinitis, unspecified leg: Secondary | ICD-10-CM | POA: Insufficient documentation

## 2021-04-15 ENCOUNTER — Encounter: Payer: Self-pay | Admitting: Internal Medicine

## 2021-04-23 ENCOUNTER — Other Ambulatory Visit: Payer: Self-pay | Admitting: Internal Medicine

## 2021-05-02 DIAGNOSIS — S93402A Sprain of unspecified ligament of left ankle, initial encounter: Secondary | ICD-10-CM | POA: Insufficient documentation

## 2021-05-24 ENCOUNTER — Encounter: Payer: Self-pay | Admitting: Internal Medicine

## 2021-05-24 ENCOUNTER — Other Ambulatory Visit: Payer: Self-pay

## 2021-05-24 ENCOUNTER — Ambulatory Visit: Payer: Managed Care, Other (non HMO) | Admitting: Internal Medicine

## 2021-05-24 DIAGNOSIS — J4520 Mild intermittent asthma, uncomplicated: Secondary | ICD-10-CM

## 2021-05-24 DIAGNOSIS — F413 Other mixed anxiety disorders: Secondary | ICD-10-CM | POA: Diagnosis not present

## 2021-05-24 DIAGNOSIS — E119 Type 2 diabetes mellitus without complications: Secondary | ICD-10-CM

## 2021-05-24 DIAGNOSIS — E559 Vitamin D deficiency, unspecified: Secondary | ICD-10-CM

## 2021-05-24 DIAGNOSIS — R109 Unspecified abdominal pain: Secondary | ICD-10-CM | POA: Insufficient documentation

## 2021-05-24 DIAGNOSIS — I10 Essential (primary) hypertension: Secondary | ICD-10-CM

## 2021-05-24 DIAGNOSIS — E669 Obesity, unspecified: Secondary | ICD-10-CM

## 2021-05-24 DIAGNOSIS — R10A0 Flank pain, unspecified side: Secondary | ICD-10-CM

## 2021-05-24 DIAGNOSIS — E538 Deficiency of other specified B group vitamins: Secondary | ICD-10-CM

## 2021-05-24 DIAGNOSIS — R0789 Other chest pain: Secondary | ICD-10-CM

## 2021-05-24 LAB — CBC WITH DIFFERENTIAL/PLATELET
Basophils Absolute: 0 10*3/uL (ref 0.0–0.1)
Basophils Relative: 0.4 % (ref 0.0–3.0)
Eosinophils Absolute: 0 10*3/uL (ref 0.0–0.7)
Eosinophils Relative: 0.2 % (ref 0.0–5.0)
HCT: 43.5 % (ref 36.0–46.0)
Hemoglobin: 14.6 g/dL (ref 12.0–15.0)
Lymphocytes Relative: 25.5 % (ref 12.0–46.0)
Lymphs Abs: 2.2 10*3/uL (ref 0.7–4.0)
MCHC: 33.5 g/dL (ref 30.0–36.0)
MCV: 89.8 fl (ref 78.0–100.0)
Monocytes Absolute: 0.7 10*3/uL (ref 0.1–1.0)
Monocytes Relative: 7.7 % (ref 3.0–12.0)
Neutro Abs: 5.8 10*3/uL (ref 1.4–7.7)
Neutrophils Relative %: 66.2 % (ref 43.0–77.0)
Platelets: 193 10*3/uL (ref 150.0–400.0)
RBC: 4.85 Mil/uL (ref 3.87–5.11)
RDW: 13.1 % (ref 11.5–15.5)
WBC: 8.7 10*3/uL (ref 4.0–10.5)

## 2021-05-24 LAB — URINALYSIS
Bilirubin Urine: NEGATIVE
Hgb urine dipstick: NEGATIVE
Ketones, ur: NEGATIVE
Leukocytes,Ua: NEGATIVE
Nitrite: NEGATIVE
Specific Gravity, Urine: 1.025 (ref 1.000–1.030)
Total Protein, Urine: NEGATIVE
Urine Glucose: >=1000 — AB
Urobilinogen, UA: 0.2 (ref 0.0–1.0)
pH: 5.5 (ref 5.0–8.0)

## 2021-05-24 LAB — COMPREHENSIVE METABOLIC PANEL
ALT: 25 U/L (ref 0–35)
AST: 19 U/L (ref 0–37)
Albumin: 4.4 g/dL (ref 3.5–5.2)
Alkaline Phosphatase: 81 U/L (ref 39–117)
BUN: 19 mg/dL (ref 6–23)
CO2: 25 mEq/L (ref 19–32)
Calcium: 9.5 mg/dL (ref 8.4–10.5)
Chloride: 105 mEq/L (ref 96–112)
Creatinine, Ser: 0.77 mg/dL (ref 0.40–1.20)
GFR: 85.52 mL/min (ref >60.00)
Glucose, Bld: 101 mg/dL — ABNORMAL HIGH (ref 70–99)
Potassium: 4.1 mEq/L (ref 3.5–5.1)
Sodium: 139 mEq/L (ref 135–145)
Total Bilirubin: 0.3 mg/dL (ref 0.2–1.2)
Total Protein: 7.4 g/dL (ref 6.0–8.3)

## 2021-05-24 LAB — HEMOGLOBIN A1C: Hgb A1c MFr Bld: 6 % (ref 4.6–6.5)

## 2021-05-24 MED ORDER — GABAPENTIN 100 MG PO CAPS: 100.0000 mg | ORAL_CAPSULE | Freq: Every day | ORAL | 1 refills | Status: AC

## 2021-05-24 MED ORDER — ALBUTEROL SULFATE HFA 108 (90 BASE) MCG/ACT IN AERS: INHALATION_SPRAY | RESPIRATORY_TRACT | 3 refills | Status: DC

## 2021-05-24 MED ORDER — OZEMPIC (0.25 OR 0.5 MG/DOSE) 2 MG/1.5ML ~~LOC~~ SOPN: 0.5000 mg | PEN_INJECTOR | SUBCUTANEOUS | 1 refills | Status: DC

## 2021-05-24 NOTE — Assessment & Plan Note (Signed)
We added Ozempic

## 2021-05-24 NOTE — Assessment & Plan Note (Signed)
ON b12

## 2021-05-24 NOTE — Progress Notes (Signed)
Subjective:  Patient ID: Amy Lang, female    DOB: [birth year]  Age: 55 y.o. MRN: [id number]  CC: Flank Pain (Right sided flank pain)   HPI Amy Lang presents for DM, HTN, asthma f/u C/o R rib pain since May w/COVID constant - worse at times. Advil, heat helps  Outpatient Medications Prior to Visit  Medication Sig Dispense Refill   ALPRAZolam (XANAX) 0.25 MG tablet Take 1 tablet (0.25 mg total) by mouth 2 (two) times daily as needed for anxiety (panic attacks). 60 tablet 1   aspirin EC 81 MG tablet Take 81 mg by mouth every evening.     benzonatate (TESSALON) 100 MG capsule Take 1 capsule (100 mg total) by mouth 3 (three) times daily as needed. 20 capsule 0   cetirizine (ZYRTEC) 10 MG tablet Take 10 mg by mouth daily.     Cholecalciferol (VITAMIN D3) 50 MCG (2000 UT) capsule Take 1 capsule (2,000 Units total) by mouth daily. 100 capsule 3   cyanocobalamin (,VITAMIN B-12,) 1000 MCG/ML injection INJECT 1 ML IM OR SUBQ EVERY 2 WEEKS (LIFETIME) 6 mL 3   empagliflozin (JARDIANCE) 10 MG TABS tablet Take 1 tablet (10 mg total) by mouth daily. 90 tablet 3   FLUoxetine (PROZAC) 40 MG capsule TAKE 1 CAPSULE BY MOUTH EVERY DAY 90 capsule 3   fluticasone (FLONASE) 50 MCG/ACT nasal spray SPRAY 2 SPRAYS INTO EACH NOSTRIL EVERY DAY 48 mL 3   losartan (COZAAR) 50 MG tablet TAKE 2 TABLETS (100MG ) BY MOUTH EVERY DAY 180 tablet 3   metFORMIN (GLUCOPHAGE XR) 500 MG 24 hr tablet Take 1 tablet (500 mg total) by mouth daily with breakfast. 90 tablet 3   Multiple Vitamin (MULTIVITAMIN WITH MINERALS) TABS Take 1 tablet by mouth daily.     Multiple Vitamins-Minerals (PRESERVISION AREDS 2) CAPS Take 1 capsule by mouth at bedtime.     Omega-3 Fatty Acids (FISH OIL) 1000 MG CAPS Take 1,000 mg by mouth daily.     pantoprazole (PROTONIX) 20 MG tablet Take 1 tablet (20 mg total) by mouth daily. 90 tablet 3   Probiotic Product (ALIGN) 4 MG CAPS Take 1 capsule by mouth daily. 30 capsule 1   topiramate (TOPAMAX)  200 MG tablet TAKE 0.5-1 TABLETS (100-200 MG TOTAL) BY MOUTH DAILY. 30 tablet 5   venlafaxine XR (EFFEXOR-XR) 37.5 MG 24 hr capsule TAKE 1 CAPSULE BY MOUTH DAILY WITH BREAKFAST. 90 capsule 2   vitamin C (ASCORBIC ACID) 500 MG tablet Take 500 mg by mouth daily.     vitamin E 1000 UNIT capsule Take 1,000 Units by mouth daily.     Zinc 50 MG CAPS Take by mouth.     albuterol (VENTOLIN HFA) 108 (90 Base) MCG/ACT inhaler TAKE 2 PUFFS BY MOUTH EVERY 6 HOURS AS NEEDED FOR WHEEZE OR SHORTNESS OF BREATH 6.7 each 1   ertugliflozin L-PyroglutamicAc (STEGLATRO) 5 MG TABS tablet Steglatro 5 mg tablet  TAKE 1 TABLET BY MOUTH EVERY DAY *OVERDUE FOR FOLLOWUP APPT,MUST SEE PROVIDER**     No facility-administered medications prior to visit.    ROS: Review of Systems  Constitutional:  Negative for activity change, appetite change, chills, fatigue and unexpected weight change.  HENT:  Negative for congestion, mouth sores and sinus pressure.   Eyes:  Negative for visual disturbance.  Respiratory:  Positive for cough and wheezing. Negative for chest tightness.   Gastrointestinal:  Negative for abdominal pain and nausea.  Genitourinary:  Negative for difficulty urinating, frequency and vaginal  pain.  Musculoskeletal:  Positive for back pain. Negative for gait problem.  Skin:  Negative for pallor and rash.  Neurological:  Negative for dizziness, tremors, weakness, numbness and headaches.  Psychiatric/Behavioral:  Negative for confusion and sleep disturbance.    Objective:  BP 130/72 (BP Location: Left Arm, Patient Position: Sitting, Cuff Size: Normal)    Pulse 74    Temp 98.5 F (36.9 C) (Oral)    Ht 5\' 1"  (1.549 m)    Wt 166 lb 12.8 oz (75.7 kg)    SpO2 99%    BMI 31.52 kg/m   BP Readings from Last 3 Encounters:  05/24/21 130/72  11/15/20 140/78  09/01/19 128/74    Wt Readings from Last 3 Encounters:  05/24/21 166 lb 12.8 oz (75.7 kg)  11/15/20 163 lb 3.2 oz (74 kg)  10/15/20 165 lb (74.8 kg)     Physical Exam Constitutional:      General: She is not in acute distress.    Appearance: She is well-developed. She is obese.  HENT:     Head: Normocephalic.     Right Ear: External ear normal.     Left Ear: External ear normal.     Nose: Nose normal.  Eyes:     General:        Right eye: No discharge.        Left eye: No discharge.     Conjunctiva/sclera: Conjunctivae normal.     Pupils: Pupils are equal, round, and reactive to light.  Neck:     Thyroid: No thyromegaly.     Vascular: No JVD.     Trachea: No tracheal deviation.  Cardiovascular:     Rate and Rhythm: Normal rate and regular rhythm.     Heart sounds: Normal heart sounds.  Pulmonary:     Effort: No respiratory distress.     Breath sounds: No stridor. No wheezing.  Abdominal:     General: Bowel sounds are normal. There is no distension.     Palpations: Abdomen is soft. There is no mass.     Tenderness: There is no abdominal tenderness. There is no guarding or rebound.  Musculoskeletal:        General: Tenderness present.     Cervical back: Normal range of motion and neck supple. No rigidity.  Lymphadenopathy:     Cervical: No cervical adenopathy.  Skin:    Findings: No erythema or rash.  Neurological:     Mental Status: She is oriented to person, place, and time.     Cranial Nerves: No cranial nerve deficit.     Motor: No abnormal muscle tone.     Coordination: Coordination normal.     Deep Tendon Reflexes: Reflexes normal.  Psychiatric:        Behavior: Behavior normal.        Thought Content: Thought content normal.        Judgment: Judgment normal.  R post ribs, flank - painful  Lab Results  Component Value Date   WBC 5.7 11/15/2020   HGB 15.2 (H) 11/15/2020   HCT 44.8 11/15/2020   PLT 164.0 11/15/2020   GLUCOSE 96 11/15/2020   CHOL 187 11/15/2020   TRIG 195.0 (H) 11/15/2020   HDL 54.60 11/15/2020   LDLDIRECT 96.0 10/01/2017   LDLCALC 93 11/15/2020   ALT 22 11/15/2020   AST 21  11/15/2020   NA 139 11/15/2020   K 3.8 11/15/2020   CL 106 11/15/2020   CREATININE 0.73 11/15/2020  BUN 19 11/15/2020   CO2 25 11/15/2020   TSH 1.81 11/15/2020   HGBA1C 5.8 11/15/2020   MICROALBUR 0.9 11/15/2020    MM 3D SCREEN BREAST BILATERAL  Result Date: 01/18/2021 CLINICAL DATA:  Screening. EXAM: DIGITAL SCREENING BILATERAL MAMMOGRAM WITH TOMOSYNTHESIS AND CAD TECHNIQUE: Bilateral screening digital craniocaudal and mediolateral oblique mammograms were obtained. Bilateral screening digital breast tomosynthesis was performed. The images were evaluated with computer-aided detection. COMPARISON:  Previous exam(s). ACR Breast Density Category c: The breast tissue is heterogeneously dense, which may obscure small masses. FINDINGS: There are no findings suspicious for malignancy. IMPRESSION: No mammographic evidence of malignancy. A result letter of this screening mammogram will be mailed directly to the patient. RECOMMENDATION: Screening mammogram in one year. (Code:SM-B-01Y) BI-RADS CATEGORY  1: Negative. Electronically Signed   By: Lajean Manes M.D.   On: 01/18/2021 15:33    Assessment & Plan:   Problem List Items Addressed This Visit     Asthma (Chronic)   Relevant Medications   albuterol (VENTOLIN HFA) 108 (90 Base) MCG/ACT inhaler   B12 deficiency (Chronic)    ON b12      Essential hypertension (Chronic)    Losartan      Obesity (BMI 30-39.9) (Chronic)    We added Ozempic      Relevant Medications   Semaglutide,0.25 or 0.5MG /DOS, (OZEMPIC, 0.25 OR 0.5 MG/DOSE,) 2 MG/1.5ML SOPN   Anxiety disorder    Prozac Xanax prn Add Effexor XR low dose  Potential benefits of a long term benzodiazepines  use as well as potential risks  and complications were explained to the patient and were aknowledged.      Chest pain, atypical    R chest/flank pain x 6 months.  Advil prn UA, CBC CXR Abd [name]      Relevant Orders   DG Chest 2 View   US Abdomen Complete   Flank pain     R chest/flank pain x 6 months.  Advil prn UA, CBC CXR Abd [name]      Relevant Orders   DG Chest 2 View   CBC with Differential/Platelet   Comprehensive metabolic panel   Urinalysis   Hemoglobin A1c   US Abdomen Complete   Type 2 diabetes mellitus without complication, without long-term current use of insulin (HCC)    Metformin 500 mg XR a day      Relevant Medications   Semaglutide,0.25 or 0.5MG /DOS, (OZEMPIC, 0.25 OR 0.5 MG/DOSE,) 2 MG/1.5ML SOPN   Other Relevant Orders   CBC with Differential/Platelet   Comprehensive metabolic panel   Urinalysis   Hemoglobin A1c   Vitamin D deficiency    On Vit D         Meds ordered this encounter  Medications   albuterol (VENTOLIN HFA) 108 (90 Base) MCG/ACT inhaler    Sig: TAKE 2 PUFFS BY MOUTH EVERY 6 HOURS AS NEEDED FOR WHEEZE OR SHORTNESS OF BREATH    Dispense:  54 each    Refill:  3    PATIENT NEEDS REFILL   Semaglutide,0.25 or 0.5MG /DOS, (OZEMPIC, 0.25 OR 0.5 MG/DOSE,) 2 MG/1.5ML SOPN    Sig: Inject 0.5 mg into the skin once a week.    Dispense:  1.5 mL    Refill:  1   gabapentin (NEURONTIN) 100 MG capsule    Sig: Take 1-2 capsules (100-200 mg total) by mouth at bedtime.    Dispense:  180 capsule    Refill:  1      Follow-up: Return in  about 2 months (around 07/25/2021) for a follow-up visit.  Walker Kehr, MD

## 2021-05-24 NOTE — Assessment & Plan Note (Signed)
R chest/flank pain x 6 months.  Advil prn UA, CBC CXR Abd [name]

## 2021-05-24 NOTE — Assessment & Plan Note (Signed)
Metformin 500 mg XR a day

## 2021-05-24 NOTE — Assessment & Plan Note (Signed)
Losartan 

## 2021-05-24 NOTE — Assessment & Plan Note (Signed)
On Vit D 

## 2021-05-24 NOTE — Assessment & Plan Note (Signed)
Prozac Xanax prn Add Effexor XR low dose  Potential benefits of a long term benzodiazepines  use as well as potential risks  and complications were explained to the patient and were aknowledged.

## 2021-05-25 ENCOUNTER — Ambulatory Visit (INDEPENDENT_AMBULATORY_CARE_PROVIDER_SITE_OTHER): Payer: Managed Care, Other (non HMO)

## 2021-05-25 DIAGNOSIS — R109 Unspecified abdominal pain: Secondary | ICD-10-CM | POA: Diagnosis not present

## 2021-05-25 DIAGNOSIS — R0789 Other chest pain: Secondary | ICD-10-CM | POA: Diagnosis not present

## 2021-05-25 DIAGNOSIS — R10A0 Flank pain, unspecified side: Secondary | ICD-10-CM

## 2021-05-26 ENCOUNTER — Ambulatory Visit
Admission: RE | Admit: 2021-05-26 | Discharge: 2021-05-26 | Disposition: A | Discharge status: Home / Self Care | Payer: Managed Care, Other (non HMO) | Source: Ambulatory Visit | Attending: Internal Medicine | Admitting: Internal Medicine

## 2021-06-06 ENCOUNTER — Telehealth: Payer: Self-pay | Admitting: Internal Medicine

## 2021-06-06 NOTE — Telephone Encounter (Signed)
Connected to [facility] 1.8.2023.   Caller states she tested positive for Covid yesterday. Has covid headache,chest pain with coughing, back pain, cough, nausea. Bilateral ear ache, sore throat and teeth pain hurts and feels like a sinus infection per caller. SOB with talking, has asthma and uses albuterol inhaler. No fever.  Advised to call PCP within 24 hours.

## 2021-06-07 ENCOUNTER — Encounter: Payer: Self-pay | Admitting: Internal Medicine

## 2021-06-07 ENCOUNTER — Telehealth (INDEPENDENT_AMBULATORY_CARE_PROVIDER_SITE_OTHER): Payer: Managed Care, Other (non HMO) | Admitting: Internal Medicine

## 2021-06-07 ENCOUNTER — Other Ambulatory Visit: Payer: Self-pay

## 2021-06-07 DIAGNOSIS — J4520 Mild intermittent asthma, uncomplicated: Secondary | ICD-10-CM | POA: Diagnosis not present

## 2021-06-07 DIAGNOSIS — J209 Acute bronchitis, unspecified: Secondary | ICD-10-CM | POA: Insufficient documentation

## 2021-06-07 DIAGNOSIS — U071 COVID-19: Secondary | ICD-10-CM | POA: Diagnosis not present

## 2021-06-07 MED ORDER — METHYLPREDNISOLONE 4 MG PO TBPK: ORAL_TABLET | ORAL | 0 refills | Status: DC

## 2021-06-07 MED ORDER — NIRMATRELVIR/RITONAVIR (PAXLOVID)TABLET: 3.0000 | ORAL_TABLET | Freq: Two times a day (BID) | ORAL | 0 refills | Status: AC

## 2021-06-07 MED ORDER — AZITHROMYCIN 250 MG PO TABS: ORAL_TABLET | ORAL | 0 refills | Status: DC

## 2021-06-07 MED ORDER — PROMETHAZINE-DM 6.25-15 MG/5ML PO SYRP: 5.0000 mL | ORAL_SOLUTION | Freq: Four times a day (QID) | ORAL | 0 refills | Status: DC | PRN

## 2021-06-07 NOTE — Progress Notes (Signed)
Virtual Visit via Video Note  I connected with Amy Lang on 06/07/21 at  2:40 PM EST by a video enabled telemedicine application and verified that I am speaking with the correct person using two identifiers.   I discussed the limitations of evaluation and management by telemedicine and the availability of in person appointments. The patient expressed understanding and agreed to proceed.  I was located at our [facility] office. The patient was at home. There was no one else present in the visit.  No chief complaint on file.    History of Present Illness: Amy Lang got sick on Saturday.  Should Welcome1 Covid (+) on Sat.  She was getting worse with severe cough, chest pain, congestion.  Her mucus is yellow now.  She was not able to sleep due to cough.  She had nausea vomiting diarrhea for couple days that now resolved.  She is still sick.  She has a low-grade fever of 99.1.  Review of Systems  Constitutional:  Positive for chills and fever.  HENT:  Positive for congestion and sinus pain.   Respiratory:  Positive for cough and sputum production.   Cardiovascular:  Positive for chest pain.    Observations/Objective: The patient appears to be in no acute distress.  She looks tired.  Assessment and Plan:  Problem List Items Addressed This Visit     Asthma (Chronic)    Treat COVID.  Prescribed Z-Pak.  Use Medrol pack.  Continue with albuterol Cough syrup with promethazine.      Relevant Medications   methylPREDNISolone (MEDROL DOSEPAK) 4 MG TBPK tablet   Acute bronchitis    Treat COVID.  Prescribed Z-Pak.  Use Medrol pack.  Continue with albuterol Cough syrup with promethazine.      COVID-19    Will prescribe Paxlovid.  She was able to tolerate it well last time.  Supportive measures      Relevant Medications   azithromycin (ZITHROMAX Z-PAK) 250 MG tablet   nirmatrelvir/ritonavir EUA (PAXLOVID) 20 x 150 MG & 10 x 100MG  TABS     Meds ordered this encounter   Medications   azithromycin (ZITHROMAX Z-PAK) 250 MG tablet    Sig: As directed    Dispense:  6 tablet    Refill:  0   methylPREDNISolone (MEDROL DOSEPAK) 4 MG TBPK tablet    Sig: As directed    Dispense:  21 tablet    Refill:  0   nirmatrelvir/ritonavir EUA (PAXLOVID) 20 x 150 MG & 10 x 100MG  TABS    Sig: Take 3 tablets by mouth 2 (two) times daily for 5 days. (Take nirmatrelvir 150 mg two tablets twice daily for 5 days and ritonavir 100 mg one tablet twice daily for 5 days) Patient GFR is 91    Dispense:  30 tablet    Refill:  0   promethazine-dextromethorphan (PROMETHAZINE-DM) 6.25-15 MG/5ML syrup    Sig: Take 5 mLs by mouth 4 (four) times daily as needed for cough.    Dispense:  240 mL    Refill:  0     Follow Up Instructions:    I discussed the assessment and treatment plan with the patient. The patient was provided an opportunity to ask questions and all were answered. The patient agreed with the plan and demonstrated an understanding of the instructions.   The patient was advised to call back or seek an in-person evaluation if the symptoms worsen or if the condition fails to improve as anticipated.  I provided  face-to-face time during this encounter. We were at different locations.   Walker Kehr, MD

## 2021-06-07 NOTE — Assessment & Plan Note (Signed)
Treat COVID.  Prescribed Z-Pak.  Use Medrol pack.  Continue with albuterol Cough syrup with promethazine.

## 2021-06-07 NOTE — Assessment & Plan Note (Signed)
Will prescribe Paxlovid.  She was able to tolerate it well last time.  Supportive measures

## 2021-07-18 ENCOUNTER — Other Ambulatory Visit: Payer: Self-pay | Admitting: Internal Medicine

## 2021-08-18 ENCOUNTER — Other Ambulatory Visit: Payer: Self-pay | Admitting: Internal Medicine

## 2021-09-05 ENCOUNTER — Encounter: Payer: Self-pay | Admitting: Internal Medicine

## 2021-09-05 ENCOUNTER — Ambulatory Visit (INDEPENDENT_AMBULATORY_CARE_PROVIDER_SITE_OTHER): Payer: BC Managed Care – PPO | Admitting: Internal Medicine

## 2021-09-05 VITALS — BP 124/74 | HR 96 | Temp 98.0°F | Ht 61.0 in | Wt 158.6 lb

## 2021-09-05 DIAGNOSIS — E119 Type 2 diabetes mellitus without complications: Secondary | ICD-10-CM | POA: Diagnosis not present

## 2021-09-05 DIAGNOSIS — G9332 Myalgic encephalomyelitis/chronic fatigue syndrome: Secondary | ICD-10-CM

## 2021-09-05 DIAGNOSIS — E538 Deficiency of other specified B group vitamins: Secondary | ICD-10-CM

## 2021-09-05 LAB — TSH: TSH: 2.04 u[IU]/mL (ref 0.35–5.50)

## 2021-09-05 LAB — COMPREHENSIVE METABOLIC PANEL
ALT: 19 U/L (ref 0–35)
AST: 18 U/L (ref 0–37)
Albumin: 4.5 g/dL (ref 3.5–5.2)
Alkaline Phosphatase: 67 U/L (ref 39–117)
BUN: 18 mg/dL (ref 6–23)
CO2: 24 mEq/L (ref 19–32)
Calcium: 9.4 mg/dL (ref 8.4–10.5)
Chloride: 108 mEq/L (ref 96–112)
Creatinine, Ser: 0.75 mg/dL (ref 0.40–1.20)
GFR: 88.09 mL/min (ref >60.00)
Glucose, Bld: 88 mg/dL (ref 70–99)
Potassium: 4.4 mEq/L (ref 3.5–5.1)
Sodium: 141 mEq/L (ref 135–145)
Total Bilirubin: 0.4 mg/dL (ref 0.2–1.2)
Total Protein: 7.1 g/dL (ref 6.0–8.3)

## 2021-09-05 LAB — HEMOGLOBIN A1C: Hgb A1c MFr Bld: 5.7 % (ref 4.6–6.5)

## 2021-09-05 MED ORDER — AMPHETAMINE-DEXTROAMPHETAMINE 10 MG PO TABS: 10.0000 mg | ORAL_TABLET | Freq: Two times a day (BID) | ORAL | 0 refills | Status: DC

## 2021-09-05 MED ORDER — OZEMPIC (0.25 OR 0.5 MG/DOSE) 2 MG/1.5ML ~~LOC~~ SOPN: 0.2500 mg | PEN_INJECTOR | SUBCUTANEOUS | 3 refills | Status: DC

## 2021-09-05 MED ORDER — ONDANSETRON HCL 4 MG PO TABS: 4.0000 mg | ORAL_TABLET | Freq: Three times a day (TID) | ORAL | 0 refills | Status: DC | PRN

## 2021-09-05 NOTE — Assessment & Plan Note (Signed)
On B12 

## 2021-09-05 NOTE — Patient Instructions (Signed)
Wt Readings from Last 3 Encounters:  ?09/05/21 158 lb 9.6 oz (71.9 kg)  ?05/24/21 166 lb 12.8 oz (75.7 kg)  ?11/15/20 163 lb 3.2 oz (74 kg)  ? ? ?

## 2021-09-05 NOTE — Assessment & Plan Note (Signed)
Worse after COVID 19. No relation to meds. ?Start Adderall low dose ? Potential benefits of a long term amphetamines  use as well as potential risks  and complications were explained to the patient and were aknowledged. ? ?

## 2021-09-05 NOTE — Progress Notes (Signed)
? ?Subjective:  ?Patient ID: Amy Lang, female    DOB: 1965/12/27  Age: 56 y.o. MRN: [id number] ? ?CC: Medication Refill (Ozempic ) ? ? ?HPI ?Amy Lang presents for anxiety,  ?C/o fatigue - CFS (worse after COVID), asthma ?C/o sleeping a lot ?On Ozempic low dose - loosing wt ? ?Outpatient Medications Prior to Visit  ?Medication Sig Dispense Refill  ? albuterol (VENTOLIN HFA) 108 (90 Base) MCG/ACT inhaler TAKE 2 PUFFS BY MOUTH EVERY 6 HOURS AS NEEDED FOR WHEEZE OR SHORTNESS OF BREATH 54 each 3  ? ALPRAZolam (XANAX) 0.25 MG tablet Take 1 tablet (0.25 mg total) by mouth 2 (two) times daily as needed for anxiety (panic attacks). 60 tablet 1  ? aspirin EC 81 MG tablet Take 81 mg by mouth every evening.    ? azithromycin (ZITHROMAX Z-PAK) 250 MG tablet As directed 6 tablet 0  ? cetirizine (ZYRTEC) 10 MG tablet Take 10 mg by mouth daily.    ? Cholecalciferol (VITAMIN D3) 50 MCG (2000 UT) capsule Take 1 capsule (2,000 Units total) by mouth daily. 100 capsule 3  ? cyanocobalamin (,VITAMIN B-12,) 1000 MCG/ML injection INJECT 1 ML IM OR SUBQ EVERY 2 WEEKS (LIFETIME) 6 mL 3  ? empagliflozin (JARDIANCE) 10 MG TABS tablet Take 1 tablet (10 mg total) by mouth daily. 90 tablet 3  ? FLUoxetine (PROZAC) 40 MG capsule TAKE 1 CAPSULE BY MOUTH EVERY DAY 90 capsule 3  ? fluticasone (FLONASE) 50 MCG/ACT nasal spray SPRAY 2 SPRAYS INTO EACH NOSTRIL EVERY DAY 48 mL 3  ? gabapentin (NEURONTIN) 100 MG capsule Take 1-2 capsules (100-200 mg total) by mouth at bedtime. 180 capsule 1  ? losartan (COZAAR) 50 MG tablet TAKE 2 TABLETS ('100MG'$ ) BY MOUTH EVERY DAY 180 tablet 3  ? metFORMIN (GLUCOPHAGE XR) 500 MG 24 hr tablet Take 1 tablet (500 mg total) by mouth daily with breakfast. 90 tablet 3  ? methylPREDNISolone (MEDROL DOSEPAK) 4 MG TBPK tablet As directed 21 tablet 0  ? Multiple Vitamins-Minerals (PRESERVISION AREDS 2) CAPS Take 1 capsule by mouth at bedtime.    ? Omega-3 Fatty Acids (FISH OIL) 1000 MG CAPS Take 1,000 mg by mouth daily.     ? pantoprazole (PROTONIX) 20 MG tablet Take 1 tablet (20 mg total) by mouth daily. 90 tablet 3  ? Probiotic Product (ALIGN) 4 MG CAPS Take 1 capsule by mouth daily. 30 capsule 1  ? topiramate (TOPAMAX) 200 MG tablet TAKE 1/2-1 TABLETS BY MOUTH DAILY 90 tablet 1  ? venlafaxine XR (EFFEXOR-XR) 37.5 MG 24 hr capsule TAKE 1 CAPSULE BY MOUTH DAILY WITH BREAKFAST. 90 capsule 2  ? vitamin C (ASCORBIC ACID) 500 MG tablet Take 500 mg by mouth daily.    ? vitamin E 1000 UNIT capsule Take 1,000 Units by mouth daily.    ? Zinc 50 MG CAPS Take by mouth.    ? benzonatate (TESSALON) 100 MG capsule Take 1 capsule (100 mg total) by mouth 3 (three) times daily as needed. 20 capsule 0  ? Multiple Vitamin (MULTIVITAMIN WITH MINERALS) TABS Take 1 tablet by mouth daily.    ? promethazine-dextromethorphan (PROMETHAZINE-DM) 6.25-15 MG/5ML syrup Take 5 mLs by mouth 4 (four) times daily as needed for cough. 240 mL 0  ? Semaglutide,0.25 or 0.'5MG'$ /DOS, (OZEMPIC, 0.25 OR 0.5 MG/DOSE,) 2 MG/1.5ML SOPN Inject 0.5 mg into the skin once a week. 1.5 mL 1  ? ?No facility-administered medications prior to visit.  ? ? ?ROS: ?Review of Systems  ?Constitutional:  Positive for fatigue. Negative for activity change, appetite change, chills and unexpected weight change.  ?HENT:  Negative for congestion, mouth sores and sinus pressure.   ?Eyes:  Negative for visual disturbance.  ?Respiratory:  Negative for cough and chest tightness.   ?Gastrointestinal:  Negative for abdominal pain and nausea.  ?Genitourinary:  Negative for difficulty urinating, frequency and vaginal pain.  ?Musculoskeletal:  Negative for back pain and gait problem.  ?Skin:  Negative for pallor and rash.  ?Neurological:  Negative for dizziness, tremors, weakness, numbness and headaches.  ?Psychiatric/Behavioral:  Positive for sleep disturbance. Negative for confusion and suicidal ideas.   ? ?Objective:  ?BP 124/74   Pulse 96   Temp 98 ?F (36.7 ?C) (Oral)   Ht '5\' 1"'$  (1.549 m)   Wt 158  lb 9.6 oz (71.9 kg)   SpO2 98%   BMI 29.97 kg/m?  ? ?BP Readings from Last 3 Encounters:  ?09/05/21 124/74  ?05/24/21 130/72  ?11/15/20 140/78  ? ? ?Wt Readings from Last 3 Encounters:  ?09/05/21 158 lb 9.6 oz (71.9 kg)  ?05/24/21 166 lb 12.8 oz (75.7 kg)  ?11/15/20 163 lb 3.2 oz (74 kg)  ? ? ?Physical Exam ?Constitutional:   ?   General: She is not in acute distress. ?   Appearance: She is well-developed.  ?HENT:  ?   Head: Normocephalic.  ?   Right Ear: External ear normal.  ?   Left Ear: External ear normal.  ?   Nose: Nose normal.  ?Eyes:  ?   General:     ?   Right eye: No discharge.     ?   Left eye: No discharge.  ?   Conjunctiva/sclera: Conjunctivae normal.  ?   Pupils: Pupils are equal, round, and reactive to light.  ?Neck:  ?   Thyroid: No thyromegaly.  ?   Vascular: No JVD.  ?   Trachea: No tracheal deviation.  ?Cardiovascular:  ?   Rate and Rhythm: Normal rate and regular rhythm.  ?   Heart sounds: Normal heart sounds.  ?Pulmonary:  ?   Effort: No respiratory distress.  ?   Breath sounds: No stridor. No wheezing.  ?Abdominal:  ?   General: Bowel sounds are normal. There is no distension.  ?   Palpations: Abdomen is soft. There is no mass.  ?   Tenderness: There is no abdominal tenderness. There is no guarding or rebound.  ?Musculoskeletal:     ?   General: No tenderness.  ?   Cervical back: Normal range of motion and neck supple. No rigidity.  ?Lymphadenopathy:  ?   Cervical: No cervical adenopathy.  ?Skin: ?   Findings: No erythema or rash.  ?Neurological:  ?   Cranial Nerves: No cranial nerve deficit.  ?   Motor: No abnormal muscle tone.  ?   Coordination: Coordination normal.  ?   Gait: Gait normal.  ?   Deep Tendon Reflexes: Reflexes normal.  ?Psychiatric:     ?   Behavior: Behavior normal.     ?   Thought Content: Thought content normal.     ?   Judgment: Judgment normal.  ? ? ?Lab Results  ?Component Value Date  ? WBC 8.7 05/24/2021  ? HGB 14.6 05/24/2021  ? HCT 43.5 05/24/2021  ? PLT 193.0  05/24/2021  ? GLUCOSE 101 (H) 05/24/2021  ? CHOL 187 11/15/2020  ? TRIG 195.0 (H) 11/15/2020  ? HDL 54.60 11/15/2020  ? LDLDIRECT 96.0 10/01/2017  ?  [place] 93 11/15/2020  ? ALT 25 05/24/2021  ? AST 19 05/24/2021  ? NA 139 05/24/2021  ? K 4.1 05/24/2021  ? CL 105 05/24/2021  ? CREATININE 0.77 05/24/2021  ? BUN 19 05/24/2021  ? CO2 25 05/24/2021  ? TSH 1.81 11/15/2020  ? HGBA1C 6.0 05/24/2021  ? MICROALBUR 0.9 11/15/2020  ? ? ?US Abdomen Complete ? ?Result Date: 05/26/2021 ?CLINICAL DATA:  Right flank pain EXAM: ABDOMEN ULTRASOUND COMPLETE COMPARISON:  CT 05/25/2014 FINDINGS: Gallbladder: Surgically absent Common bile duct: Diameter: 9 mm Liver: Appears slightly echogenic. No focal hepatic abnormality is seen. Portal vein is patent on color Doppler imaging with normal direction of blood flow towards the liver. IVC: No abnormality visualized. Pancreas: Visualized portion unremarkable. Spleen: Size and appearance within normal limits. Right Kidney: Length: 12.8 cm. Echogenicity within normal limits. No hydronephrosis. Prominent area isoechoic to normal tissue at the mid aspect of right kidney suspected to represent focally prominent cortical tissue rather than a true mass. Left Kidney: Length: 12.7 cm. Echogenicity within normal limits. No mass or hydronephrosis visualized. Abdominal aorta: No aneurysm visualized. Other findings: None. IMPRESSION: 1. Surgical absence of the gallbladder. 2. Slightly echogenic liver consistent with hepatic steatosis and or hepatocellular disease 3. Negative for hydronephrosis. Electronically Signed   By: Donavan Foil M.D.   On: 05/26/2021 15:26  ? ? ?Assessment & Plan:  ? ?Problem List Items Addressed This Visit   ? ? Type 2 diabetes mellitus without complication, without long-term current use of insulin ([place]) - Primary  ? Relevant Medications  ? Semaglutide,0.25 or 0.'5MG'$ /DOS, (OZEMPIC, 0.25 OR 0.5 MG/DOSE,) 2 MG/1.5ML SOPN  ? Other Relevant Orders  ? Comprehensive metabolic panel  ?  Hemoglobin A1c  ? TSH  ? CFS (chronic fatigue syndrome)  ?  Worse after COVID 19. No relation to meds. ?Start Adderall low dose ? Potential benefits of a long term amphetamines  use as well as potential risks  and

## 2021-09-15 ENCOUNTER — Encounter: Payer: Self-pay | Admitting: Family Medicine

## 2021-09-15 ENCOUNTER — Ambulatory Visit (INDEPENDENT_AMBULATORY_CARE_PROVIDER_SITE_OTHER): Payer: BC Managed Care – PPO | Admitting: Family Medicine

## 2021-09-15 ENCOUNTER — Ambulatory Visit: Payer: Managed Care, Other (non HMO) | Admitting: Family Medicine

## 2021-09-15 VITALS — BP 110/68 | HR 69 | Temp 97.8°F | Wt 156.4 lb

## 2021-09-15 DIAGNOSIS — W57XXXA Bitten or stung by nonvenomous insect and other nonvenomous arthropods, initial encounter: Secondary | ICD-10-CM

## 2021-09-15 DIAGNOSIS — S30860A Insect bite (nonvenomous) of lower back and pelvis, initial encounter: Secondary | ICD-10-CM | POA: Diagnosis not present

## 2021-09-15 NOTE — Progress Notes (Signed)
? ?Subjective:  ? ? ? Patient ID: Amy Lang, female    DOB: [birth year], 56 y.o.   MRN: [id number] ? ?Chief Complaint  ?Patient presents with  ? Rash  ?  Tick bite on 4/11, itching and burning along with rash at bite sight.   ? ? ?HPI ?Patient is in today for a tick bite 9 days ago on her left low back.  States the tick was very small and she still has it at home.  She will send a picture.  She does not know how long the tick was on her back.  States the tick was not engorged.  Denies fever, chills, fatigue, headache, dizziness, arthralgias or myalgias.  No chest pain, palpitations, shortness of breath, abdominal pain, nausea, vomiting or diarrhea. ? ? ? ? ?Health Maintenance Due  ?Topic Date Due  ? FOOT EXAM  Never done  ? HIV Screening  Never done  ? Hepatitis C Screening  Never done  ? ? ?Past Medical History:  ?Diagnosis Date  ? Allergy   ? Anxiety   ? Asthma   ? Depression   ? Diabetes ([place])   ? Dyslipidemia   ? Hypertension   ? IBS (irritable bowel syndrome)   ? Lactose intolerance   ? Migraines   ? Obesity   ? PONV (postoperative nausea and vomiting)   ? Shortness of breath   ? 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 Vitamin B 12 deficiency   ? Vitamin D deficiency   ? ? ?Past Surgical History:  ?Procedure Laterality Date  ? ABDOMINAL HYSTERECTOMY    ? still has bilateral ovaries  ? BREAST EXCISIONAL BIOPSY Right 08/27/2017  ? BREAST LUMPECTOMY WITH RADIOACTIVE SEED LOCALIZATION Right 08/28/2017  ? Procedure: BREAST LUMPECTOMY WITH RADIOACTIVE SEED LOCALIZATION;  Surgeon: Erroll Luna, MD;  Location: [facility];  Service: General;  Laterality: Right;  ? CHOLECYSTECTOMY  04-04-11  ? single site  ? COLONOSCOPY W/ BIOPSIES  01/24/2013  ? ESOPHAGOGASTRODUODENOSCOPY    ? KNEE SURGERY    ? LAPAROTOMY  05/12/2012  ? Procedure: LAPAROTOMY;  Surgeon: Cyril Mourning, MD;  Location: [facility];  Service: Gynecology;;  laparotomy [name] salpingectomy-oopharectomy  ? NM MYOCAR PERF WALL MOTION  09/06/2009  ? normal  ?  TRANSTHORACIC ECHOCARDIOGRAM  March 2014  ? Normal regional wall motion. EF 55%. Mild MR  ? ? ?Family History  ?Problem Relation Age of Onset  ? Heart disease Mother   ? COPD Mother   ? Heart attack Mother 3  ? Diabetes Mother   ? Hypertension Mother   ? Hyperlipidemia Mother   ? Kidney disease Mother   ? Thyroid disease Mother   ? Lung cancer Father   ? Hypertension Father   ? Hyperlipidemia Father   ? Heart disease Maternal Grandmother   ? Breast cancer Maternal Grandmother 75  ? Heart disease Maternal Grandfather   ? Heart disease Paternal Grandmother   ? Heart disease Paternal Grandfather   ? Colon cancer Neg Hx   ? Esophageal cancer Neg Hx   ? Rectal cancer Neg Hx   ? Stomach cancer Neg Hx   ? ? ?Social History  ? ?Socioeconomic History  ? Marital status: Married  ?  Spouse name: Tonesha Tsou  ? Number of children: Not on file  ? Years of education: Not on file  ? Highest education level: Not on file  ?Occupational History  ? Not on file  ?Tobacco Use  ? Smoking status:  Never  ? Smokeless tobacco: Never  ? Tobacco comments:  ?  Regular Exercise - No  ?Vaping Use  ? Vaping Use: Never used  ?Substance and Sexual Activity  ? Alcohol use: No  ? Drug use: No  ? Sexual activity: Yes  ?  Comment: hysterectomy  ?Other Topics Concern  ? Not on file  ?Social History Narrative  ? [name] with no children. Never smoked. Occasional alcohol.  ? Works as a Haematologist at [employer]. Is on her feet but 6 days per week.  ? She does a treadmill at home and walking outside of these 3-4 times a week.  ? ?Social Determinants of Health  ? ?Financial Resource Strain: Not on file  ?Food Insecurity: Not on file  ?Transportation Needs: Not on file  ?Physical Activity: Not on file  ?Stress: Not on file  ?Social Connections: Not on file  ?Intimate Partner Violence: Not on file  ? ? ?Outpatient Medications Prior to Visit  ?Medication Sig Dispense Refill  ? albuterol (VENTOLIN HFA) 108 (90 Base) MCG/ACT inhaler TAKE 2  PUFFS BY MOUTH EVERY 6 HOURS AS NEEDED FOR WHEEZE OR SHORTNESS OF BREATH 54 each 3  ? ALPRAZolam (XANAX) 0.25 MG tablet Take 1 tablet (0.25 mg total) by mouth 2 (two) times daily as needed for anxiety (panic attacks). 60 tablet 1  ? amphetamine-dextroamphetamine (ADDERALL) 10 MG tablet Take 1 tablet (10 mg total) by mouth 2 (two) times daily. breakfast and lunch 60 tablet 0  ? aspirin EC 81 MG tablet Take 81 mg by mouth every evening.    ? cetirizine (ZYRTEC) 10 MG tablet Take 10 mg by mouth daily.    ? Cholecalciferol (VITAMIN D3) 50 MCG (2000 UT) capsule Take 1 capsule (2,000 Units total) by mouth daily. 100 capsule 3  ? cyanocobalamin (,VITAMIN B-12,) 1000 MCG/ML injection INJECT 1 ML IM OR SUBQ EVERY 2 WEEKS (LIFETIME) 6 mL 3  ? empagliflozin (JARDIANCE) 10 MG TABS tablet Take 1 tablet (10 mg total) by mouth daily. 90 tablet 3  ? FLUoxetine (PROZAC) 40 MG capsule TAKE 1 CAPSULE BY MOUTH EVERY DAY 90 capsule 3  ? fluticasone (FLONASE) 50 MCG/ACT nasal spray SPRAY 2 SPRAYS INTO EACH NOSTRIL EVERY DAY 48 mL 3  ? gabapentin (NEURONTIN) 100 MG capsule Take 1-2 capsules (100-200 mg total) by mouth at bedtime. 180 capsule 1  ? losartan (COZAAR) 50 MG tablet TAKE 2 TABLETS ('100MG'$ ) BY MOUTH EVERY DAY 180 tablet 3  ? metFORMIN (GLUCOPHAGE XR) 500 MG 24 hr tablet Take 1 tablet (500 mg total) by mouth daily with breakfast. 90 tablet 3  ? Multiple Vitamins-Minerals (PRESERVISION AREDS 2) CAPS Take 1 capsule by mouth at bedtime.    ? Omega-3 Fatty Acids (FISH OIL) 1000 MG CAPS Take 1,000 mg by mouth daily.    ? ondansetron (ZOFRAN) 4 MG tablet Take 1 tablet (4 mg total) by mouth every 8 (eight) hours as needed for nausea or vomiting. 20 tablet 0  ? pantoprazole (PROTONIX) 20 MG tablet Take 1 tablet (20 mg total) by mouth daily. 90 tablet 3  ? Probiotic Product (ALIGN) 4 MG CAPS Take 1 capsule by mouth daily. 30 capsule 1  ? Semaglutide,0.25 or 0.'5MG'$ /DOS, (OZEMPIC, 0.25 OR 0.5 MG/DOSE,) 2 MG/1.5ML SOPN Inject 0.25 mg into the  skin once a week. 1.5 mL 3  ? topiramate (TOPAMAX) 200 MG tablet TAKE 1/2-1 TABLETS BY MOUTH DAILY 90 tablet 1  ? venlafaxine XR (EFFEXOR-XR) 37.5 MG 24 hr capsule  TAKE 1 CAPSULE BY MOUTH DAILY WITH BREAKFAST. 90 capsule 2  ? vitamin C (ASCORBIC ACID) 500 MG tablet Take 500 mg by mouth daily.    ? vitamin E 1000 UNIT capsule Take 1,000 Units by mouth daily.    ? Zinc 50 MG CAPS Take by mouth.    ? methylPREDNISolone (MEDROL DOSEPAK) 4 MG TBPK tablet As directed (Patient not taking: Reported on 09/15/2021) 21 tablet 0  ? azithromycin (ZITHROMAX Z-PAK) 250 MG tablet As directed 6 tablet 0  ? ?No facility-administered medications prior to visit.  ? ? ?Allergies  ?Allergen Reactions  ? Bee Pollen Anaphylaxis  ? Butorphanol   ? Butorphanol Tartrate Other (See Comments)  ?  REACTION: hallucinations ( stadol)  ? Codeine   ? Codeine Sulfate Hives and Nausea And Vomiting  ? Crestor [Rosuvastatin] Other (See Comments)  ?  Body aches  ? Darvocet [Propoxyphene N-Acetaminophen] Hives and Nausea And Vomiting  ? Estrogens   ? Levofloxacin Other (See Comments)  ?  abdominal pain  ? Metformin And Related   ?  diarrhea  ? Penicillamine   ? Penicillins Other (See Comments)  ?  Seizure ?  ? Sulfa Antibiotics Hives and Nausea And Vomiting  ? Latex Rash  ?  Blisters and redness with bandaids-also sensitivity to latex gloves  ? ? ?ROS ?Pertinent positives and negatives in the history of present illness. ? ?   ?Objective:  ?  ?Physical Exam ? ?BP 110/68 (BP Location: Left Arm, Patient Position: Sitting, Cuff Size: Large)   Pulse 69   Temp 97.8 ?F (36.6 ?C) (Temporal)   Wt 156 lb 6.4 oz (70.9 kg)   SpO2 100%   BMI 29.55 kg/m?  ?Wt Readings from Last 3 Encounters:  ?09/15/21 156 lb 6.4 oz (70.9 kg)  ?09/05/21 158 lb 9.6 oz (71.9 kg)  ?05/24/21 166 lb 12.8 oz (75.7 kg)  ? ? ? ? ? ?Insect bite with surrounding erythema, no EM rash. ?   ?Assessment & Plan:  ? ?Problem List Items Addressed This Visit   ?None ?Visit Diagnoses   ? ? Tick bite  of lower back, initial encounter    -  Primary  ? ?  ? ?Low suspicion for tickborne illness.  She does not have any symptoms of Lyme disease and she does not describe engorgement of the tick.   Rash appe

## 2021-09-15 NOTE — Patient Instructions (Signed)
Let me know if you develop fever, chills, headache, fatigue, joint pain or anything of worrisome symptoms.  ? ? ?Insect Bite, Adult ?An insect bite can make your skin red, itchy, and swollen. Some insects can spread disease to people with a bite. However, most insect bites do not lead to disease, and most are not serious. ?What are the causes? ?Insects may bite for many reasons, including: ?Hunger. ?To defend themselves. ?Insects that bite include: ?Spiders. ?Mosquitoes. ?Ticks. ?Fleas. ?Ants. ?Flies. ?Kissing bugs. ?Chiggers. ?What are the signs or symptoms? ?Symptoms of this condition include: ?Itching or pain in the bite area. ?Redness and swelling in the bite area. ?An open wound (skin ulcer). ?Symptoms often last for 2-4 days. ?In rare cases, a person may have a very bad allergic reaction (anaphylactic reaction) to a bite. Symptoms of an anaphylactic reaction may include: ?Feeling warm in the face (flushed). Your face may turn red. ?Itchy, red, swollen areas of skin (hives). ?Swelling of the: ?Eyes. ?Lips. ?Face. ?Mouth. ?Tongue. ?Throat. ?Trouble with any of these: ?Breathing. ?Talking. ?Swallowing. ?Loud breathing (wheezing). ?Feeling dizzy or light-headed. ?Passing out (fainting). ?Pain or cramps in your belly. ?Throwing up (vomiting). ?Watery poop (diarrhea). ?How is this treated? ?Treatment is usually not needed. Symptoms often go away on their own. When treatment is needed, it may involve: ?Putting a cream or lotion on the bite area. This helps with itching. ?Taking an antibiotic medicine. This treatment is needed if the bite area gets infected. ?Getting a tetanus shot, if you are not up to date on this vaccine. ?Putting ice on the affected area. ?Using medicines called antihistamines. This treatment may be needed if you have itching or an allergic reaction to the insect bite. ?Giving yourself a shot of medicine (epinephrine) using an auto-injector "pen" if you have an anaphylactic reaction to a bite.  Your doctor will teach you how to use this pen. ?Follow these instructions at home: ?Bite area care ? ?Do not scratch the bite area. ?Keep the bite area clean and dry. ?Wash the bite area every day with soap and water as told by your doctor. ?Check the bite area every day for signs of infection. Check for: ?Redness, swelling, or pain. ?Fluid or blood. ?Warmth. ?Pus or a bad smell. ?Managing pain, itching, and swelling ? ?You may put any of these on the bite area as told by your doctor: ?A paste made of baking soda and water. ?Cortisone cream. ?Calamine lotion. ?If told, put ice on the bite area. ?Put ice in a plastic bag. ?Place a towel between your skin and the bag. ?Leave the ice on for 20 minutes, 2-3 times a day. ?General instructions ?Apply or take over-the-counter and prescription medicines only as told by your doctor. ?If you were prescribed an antibiotic medicine, take or apply it as told by your doctor. Do not stop using the antibiotic even if your condition improves. ?Keep all follow-up visits as told by your doctor. This is important. ?How is this prevented? ?To help you have a lower risk of insect bites: ?When you are outside, wear clothing that covers your arms and legs. ?Use insect repellent. The best insect repellents contain one of these: ?DEET. ?Picaridin. ?Oil of lemon eucalyptus (OLE). ?IR3535. ?Consider spraying your clothing with a pesticide called permethrin. Permethrin helps prevent insect bites. It works for several weeks and for up to 5-6 clothing washes. Do not apply permethrin directly to the skin. ?If your home windows do not have screens, think about putting some in. ?  If you will be sleeping in an area where there are mosquitoes, consider covering your sleeping area with a mosquito net. ?Contact a doctor if: ?You have redness, swelling, or pain in the bite area. ?You have fluid or blood coming from the bite area. ?The bite area feels warm to the touch. ?You have pus or a bad smell coming  from the bite area. ?You have a fever. ?Get help right away if: ?You have joint pain. ?You have a rash. ?You feel more tired or sleepy than you normally do. ?You have neck pain. ?You have a headache. ?You feel weaker than you normally do. ?You have signs of an anaphylactic reaction. Signs may include: ?Feeling warm in the face. ?Itchy, red, swollen areas of skin. ?Swelling of your: ?Eyes. ?Lips. ?Face. ?Mouth. ?Tongue. ?Throat. ?Trouble with any of these: ?Breathing. ?Talking. ?Swallowing. ?Loud breathing. ?Feeling dizzy or light-headed. ?Passing out. ?Pain or cramps in your belly. ?Throwing up. ?Watery poop. ?These symptoms may be an emergency. Do not wait to see if the symptoms will go away. Do this right away: ?Use your auto-injector pen as you have been told. ?Get medical help. Call your local emergency services (911 in the [place] Do not drive yourself to the hospital. ?Summary ?An insect bite can make your skin red, itchy, and swollen. ?Treatment is usually not needed. Symptoms often go away on their own. ?Do not scratch the bite area. Keep it clean and dry. ?Ice can help with pain and itching from the bite. ?This information is not intended to replace advice given to you by your health care provider. Make sure you discuss any questions you have with your health care provider. ?Document Revised: 02/14/2021 Document Reviewed: 02/14/2021 ?Elsevier Patient Education ? [place]. ? ?

## 2021-10-05 ENCOUNTER — Telehealth: Payer: Self-pay | Admitting: *Deleted

## 2021-10-05 NOTE — Telephone Encounter (Signed)
Someone started PA for [name] w/ (Key: [id number]). Rec'd msg stating med approve Additional information will be provided in the approval communication.Marland KitchenJohny Lang ?

## 2021-10-07 NOTE — Telephone Encounter (Signed)
Rec;d coverage determination med was approved effective 10/05/21 - 10/04/23. Faxed approval letter to [place]../,b  ?

## 2021-10-31 ENCOUNTER — Telehealth: Payer: Self-pay | Admitting: Internal Medicine

## 2021-10-31 LAB — HM DIABETES EYE EXAM

## 2021-10-31 NOTE — Telephone Encounter (Signed)
Patient called stating that her insurance company needs a prior authorization on her ozempic

## 2021-10-31 NOTE — Telephone Encounter (Signed)
Rec'd msg back from insurance med was approved. Faxing &notifying pt w/ status.Marland KitchenJohny Chess

## 2021-10-31 NOTE — Telephone Encounter (Signed)
Pt was on cover-my-meds.. need PA on Ozempic. Submitted PA w/ (Key: [id number]). Rec'd msg sent to plan.Marland KitchenJohny Chess

## 2021-11-09 ENCOUNTER — Encounter: Payer: Self-pay | Admitting: Internal Medicine

## 2021-11-15 ENCOUNTER — Other Ambulatory Visit: Payer: Self-pay | Admitting: Internal Medicine

## 2021-11-30 ENCOUNTER — Other Ambulatory Visit: Payer: Self-pay | Admitting: Internal Medicine

## 2021-12-05 ENCOUNTER — Other Ambulatory Visit: Payer: Self-pay | Admitting: Internal Medicine

## 2021-12-13 ENCOUNTER — Telehealth: Payer: Self-pay

## 2021-12-13 ENCOUNTER — Telehealth: Payer: Self-pay | Admitting: Internal Medicine

## 2021-12-13 DIAGNOSIS — K219 Gastro-esophageal reflux disease without esophagitis: Secondary | ICD-10-CM

## 2021-12-13 MED ORDER — AMPHETAMINE-DEXTROAMPHETAMINE 10 MG PO TABS: 10.0000 mg | ORAL_TABLET | Freq: Two times a day (BID) | ORAL | 0 refills | Status: DC

## 2021-12-13 NOTE — Telephone Encounter (Signed)
Pt was advised by pharmacy that she needs a prior authorization for omeprazole (PRILOSEC) 10 MG capsule.    Please advise

## 2021-12-13 NOTE — Telephone Encounter (Signed)
Caller & Relationship to patient: Phylliss Strege  Call back number: [phone]  Date of last office visit: 09/05/21  Date of next office visit:   Medication(s) to be refilled: amphetamine-dextroamphetamine (ADDERALL) 10 MG tablet  [id number] [place] [address]  [phone] Fax:  [phone]

## 2021-12-13 NOTE — Telephone Encounter (Signed)
Pt is calling requesting the refill on the discontinued medication: pantoprazole (PROTONIX) 20 MG tablet.  Insurance is not covering omeprazole (PRILOSEC) 10 MG capsule.  [id number] [place] [place] [address]

## 2021-12-13 NOTE — Telephone Encounter (Signed)
Submitted PA on cover-my-meds w/ (Key: [id number]). Rec'd msg back stating  " Your PA request cannot be processed electronically. The drug(s) you requested is not supported by the electronic process. For further inquiries please contact the number on the back of the member prescription card." Will call pt to f/u on insurance.Marland KitchenJohny Chess

## 2021-12-13 NOTE — Telephone Encounter (Signed)
[place] - in August Thx

## 2021-12-14 MED ORDER — PANTOPRAZOLE SODIUM 20 MG PO TBEC: 20.0000 mg | DELAYED_RELEASE_TABLET | Freq: Every day | ORAL | 3 refills | Status: DC

## 2021-12-14 NOTE — Telephone Encounter (Signed)
Okay.  Thanks.

## 2021-12-14 NOTE — Telephone Encounter (Signed)
Pt call bck on yesterday spoke w/ Caryl Pina (scheduler) she is wanting MD to send in pantoprazole 20 mg she states she never took omeprazole 10 mg. Per chart MD sent in pantoprazole 20 mg../lmb

## 2021-12-16 NOTE — Telephone Encounter (Signed)
Pt states that she received a call from the pharmacy that the Rx was available for pick up so their were no insurance issues

## 2021-12-23 NOTE — Telephone Encounter (Signed)
Pt called today and stated that when she arrived at the pharmacy to pick up the rx she was informed that her insurance would not be covering the rx because provider did not provide insurance company with enough information. Pt was not able to tell me what information was missing. She stated she was not told, all they told her was to give her pcp office a call and let us know that more information is needed.    Please advise.

## 2021-12-26 ENCOUNTER — Other Ambulatory Visit: Payer: Self-pay

## 2021-12-26 DIAGNOSIS — K219 Gastro-esophageal reflux disease without esophagitis: Secondary | ICD-10-CM

## 2021-12-26 MED ORDER — PANTOPRAZOLE SODIUM 20 MG PO TBEC: 20.0000 mg | DELAYED_RELEASE_TABLET | Freq: Every day | ORAL | 3 refills | Status: DC

## 2021-12-26 NOTE — Telephone Encounter (Signed)
Her Dx is GERD Thx

## 2021-12-30 ENCOUNTER — Other Ambulatory Visit: Payer: Self-pay | Admitting: Internal Medicine

## 2021-12-30 DIAGNOSIS — Z1231 Encounter for screening mammogram for malignant neoplasm of breast: Secondary | ICD-10-CM

## 2022-01-09 ENCOUNTER — Encounter: Payer: Self-pay | Admitting: Internal Medicine

## 2022-01-09 ENCOUNTER — Ambulatory Visit (INDEPENDENT_AMBULATORY_CARE_PROVIDER_SITE_OTHER): Payer: BC Managed Care – PPO | Admitting: Internal Medicine

## 2022-01-09 DIAGNOSIS — E119 Type 2 diabetes mellitus without complications: Secondary | ICD-10-CM

## 2022-01-09 DIAGNOSIS — F413 Other mixed anxiety disorders: Secondary | ICD-10-CM

## 2022-01-09 DIAGNOSIS — E538 Deficiency of other specified B group vitamins: Secondary | ICD-10-CM | POA: Diagnosis not present

## 2022-01-09 DIAGNOSIS — K219 Gastro-esophageal reflux disease without esophagitis: Secondary | ICD-10-CM

## 2022-01-09 LAB — COMPREHENSIVE METABOLIC PANEL
ALT: 20 U/L (ref 0–35)
AST: 19 U/L (ref 0–37)
Albumin: 4.3 g/dL (ref 3.5–5.2)
Alkaline Phosphatase: 68 U/L (ref 39–117)
BUN: 21 mg/dL (ref 6–23)
CO2: 24 mEq/L (ref 19–32)
Calcium: 8.9 mg/dL (ref 8.4–10.5)
Chloride: 110 mEq/L (ref 96–112)
Creatinine, Ser: 0.79 mg/dL (ref 0.40–1.20)
GFR: 82.57 mL/min (ref >60.00)
Glucose, Bld: 95 mg/dL (ref 70–99)
Potassium: 4.5 mEq/L (ref 3.5–5.1)
Sodium: 141 mEq/L (ref 135–145)
Total Bilirubin: 0.3 mg/dL (ref 0.2–1.2)
Total Protein: 6.9 g/dL (ref 6.0–8.3)

## 2022-01-09 LAB — HEMOGLOBIN A1C: Hgb A1c MFr Bld: 5.6 % (ref 4.6–6.5)

## 2022-01-09 MED ORDER — AMPHETAMINE-DEXTROAMPHETAMINE 10 MG PO TABS: 10.0000 mg | ORAL_TABLET | Freq: Two times a day (BID) | ORAL | 0 refills | Status: DC

## 2022-01-09 MED ORDER — METFORMIN HCL ER 500 MG PO TB24: 500.0000 mg | ORAL_TABLET | Freq: Every evening | ORAL | 3 refills | Status: DC

## 2022-01-09 MED ORDER — SEMAGLUTIDE (1 MG/DOSE) 4 MG/3ML ~~LOC~~ SOPN: 1.0000 mg | PEN_INJECTOR | SUBCUTANEOUS | 3 refills | Status: DC

## 2022-01-09 MED ORDER — PANTOPRAZOLE SODIUM 40 MG PO TBEC: 40.0000 mg | DELAYED_RELEASE_TABLET | Freq: Every day | ORAL | 3 refills | Status: DC

## 2022-01-09 NOTE — Assessment & Plan Note (Signed)
Xanax prn Add Effexor XR low dose  Potential benefits of a long term benzodiazepines  use as well as potential risks  and complications were explained to the patient and were aknowledged.

## 2022-01-09 NOTE — Assessment & Plan Note (Signed)
Re-start B12

## 2022-01-09 NOTE — Assessment & Plan Note (Signed)
Coronary CT calcium score of 0 in 2019

## 2022-01-09 NOTE — Addendum Note (Signed)
Addended by: Cassandria Anger on: 01/09/2022 02:09 PM   Modules accepted: Orders

## 2022-01-09 NOTE — Progress Notes (Addendum)
Subjective:  Patient ID: Amy Lang, female    DOB: [birth year]  Age: 56 y.o. MRN: [id number]  CC: No chief complaint on file.   HPI VIHANA KYDD presents for DM, GERD - worse - out of Protonix, CFS post-COVID   Outpatient Medications Prior to Visit  Medication Sig Dispense Refill   albuterol (VENTOLIN HFA) 108 (90 Base) MCG/ACT inhaler TAKE 2 PUFFS BY MOUTH EVERY 6 HOURS AS NEEDED FOR WHEEZE OR SHORTNESS OF BREATH 54 each 3   ALPRAZolam (XANAX) 0.25 MG tablet Take 1 tablet (0.25 mg total) by mouth 2 (two) times daily as needed for anxiety (panic attacks). 60 tablet 1   aspirin EC 81 MG tablet Take 81 mg by mouth every evening.     cetirizine (ZYRTEC) 10 MG tablet Take 10 mg by mouth daily.     Cholecalciferol (VITAMIN D3) 50 MCG (2000 UT) capsule Take 1 capsule (2,000 Units total) by mouth daily. 100 capsule 3   cyanocobalamin (,VITAMIN B-12,) 1000 MCG/ML injection INJECT 1 ML IM OR SUBQ EVERY 2 WEEKS (LIFETIME) 6 mL 3   empagliflozin (JARDIANCE) 10 MG TABS tablet Take 1 tablet (10 mg total) by mouth daily. Follow-up appt id due in July must see provider for future refills 90 tablet 0   FLUoxetine (PROZAC) 40 MG capsule TAKE 1 CAPSULE BY MOUTH EVERY DAY 90 capsule 0   fluticasone (FLONASE) 50 MCG/ACT nasal spray SPRAY 2 SPRAYS INTO EACH NOSTRIL EVERY DAY 48 mL 3   gabapentin (NEURONTIN) 100 MG capsule Take 1-2 capsules (100-200 mg total) by mouth at bedtime. 180 capsule 1   losartan (COZAAR) 50 MG tablet TAKE 2 TABLETS ('100MG'$ ) BY MOUTH EVERY DAY 180 tablet 0   Multiple Vitamins-Minerals (PRESERVISION AREDS 2) CAPS Take 1 capsule by mouth at bedtime.     Omega-3 Fatty Acids (FISH OIL) 1000 MG CAPS Take 1,000 mg by mouth daily.     ondansetron (ZOFRAN) 4 MG tablet Take 1 tablet (4 mg total) by mouth every 8 (eight) hours as needed for nausea or vomiting. 20 tablet 0   Probiotic Product (ALIGN) 4 MG CAPS Take 1 capsule by mouth daily. 30 capsule 1   topiramate (TOPAMAX) 200 MG tablet  TAKE 1/2-1 TABLETS BY MOUTH DAILY 90 tablet 1   venlafaxine XR (EFFEXOR-XR) 37.5 MG 24 hr capsule TAKE 1 CAPSULE BY MOUTH DAILY WITH BREAKFAST. 90 capsule 2   vitamin C (ASCORBIC ACID) 500 MG tablet Take 500 mg by mouth daily.     vitamin E 1000 UNIT capsule Take 1,000 Units by mouth daily.     Zinc 50 MG CAPS Take by mouth.     amphetamine-dextroamphetamine (ADDERALL) 10 MG tablet Take 1 tablet (10 mg total) by mouth 2 (two) times daily. breakfast and lunch 60 tablet 0   metFORMIN (GLUCOPHAGE-XR) 500 MG 24 hr tablet TAKE 1 TABLET BY MOUTH EVERY DAY WITH BREAKFAST 90 tablet 3   pantoprazole (PROTONIX) 20 MG tablet Take 1 tablet (20 mg total) by mouth daily. 90 tablet 3   Semaglutide,0.25 or 0.'5MG'$ /DOS, (OZEMPIC, 0.25 OR 0.5 MG/DOSE,) 2 MG/1.5ML SOPN Inject 0.25 mg into the skin once a week. 1.5 mL 3   methylPREDNISolone (MEDROL DOSEPAK) 4 MG TBPK tablet As directed (Patient not taking: Reported on 09/15/2021) 21 tablet 0   No facility-administered medications prior to visit.    ROS: Review of Systems  Constitutional:  Positive for fatigue. Negative for activity change, appetite change, chills and unexpected weight change.  HENT:  Negative for congestion, mouth sores and sinus pressure.   Eyes:  Negative for visual disturbance.  Respiratory:  Negative for cough and chest tightness.   Gastrointestinal:  Negative for abdominal pain and nausea.  Genitourinary:  Negative for difficulty urinating, frequency and vaginal pain.  Musculoskeletal:  Negative for back pain and gait problem.  Skin:  Negative for pallor and rash.  Neurological:  Negative for dizziness, tremors, weakness, numbness and headaches.  Psychiatric/Behavioral:  Negative for confusion and sleep disturbance.     Objective:  BP 110/70 (BP Location: Left Arm, Patient Position: Sitting, Cuff Size: Large)   Pulse 70   Temp 98.6 F (37 C) (Oral)   Ht '5\' 1"'$  (1.549 m)   Wt 150 lb (68 kg)   SpO2 99%   BMI 28.34 kg/m   BP  Readings from Last 3 Encounters:  01/09/22 110/70  09/15/21 110/68  09/05/21 124/74    Wt Readings from Last 3 Encounters:  01/09/22 150 lb (68 kg)  09/15/21 156 lb 6.4 oz (70.9 kg)  09/05/21 158 lb 9.6 oz (71.9 kg)    Physical Exam Constitutional:      General: She is not in acute distress.    Appearance: She is well-developed.  HENT:     Head: Normocephalic.     Right Ear: External ear normal.     Left Ear: External ear normal.     Nose: Nose normal.  Eyes:     General:        Right eye: No discharge.        Left eye: No discharge.     Conjunctiva/sclera: Conjunctivae normal.     Pupils: Pupils are equal, round, and reactive to light.  Neck:     Thyroid: No thyromegaly.     Vascular: No JVD.     Trachea: No tracheal deviation.  Cardiovascular:     Rate and Rhythm: Normal rate and regular rhythm.     Heart sounds: Normal heart sounds.  Pulmonary:     Effort: No respiratory distress.     Breath sounds: No stridor. No wheezing.  Abdominal:     General: Bowel sounds are normal. There is no distension.     Palpations: Abdomen is soft. There is no mass.     Tenderness: There is no abdominal tenderness. There is no guarding or rebound.  Musculoskeletal:        General: No tenderness.     Cervical back: Normal range of motion and neck supple. No rigidity.  Lymphadenopathy:     Cervical: No cervical adenopathy.  Skin:    Findings: No erythema or rash.  Neurological:     Cranial Nerves: No cranial nerve deficit.     Motor: No abnormal muscle tone.     Coordination: Coordination normal.     Deep Tendon Reflexes: Reflexes normal.  Psychiatric:        Behavior: Behavior normal.        Thought Content: Thought content normal.        Judgment: Judgment normal.     Lab Results  Component Value Date   WBC 8.7 05/24/2021   HGB 14.6 05/24/2021   HCT 43.5 05/24/2021   PLT 193.0 05/24/2021   GLUCOSE 88 09/05/2021   CHOL 187 11/15/2020   TRIG 195.0 (H) 11/15/2020    HDL 54.60 11/15/2020   LDLDIRECT 96.0 10/01/2017   LDLCALC 93 11/15/2020   ALT 19 09/05/2021   AST 18 09/05/2021   NA 141 09/05/2021  K 4.4 09/05/2021   CL 108 09/05/2021   CREATININE 0.75 09/05/2021   BUN 18 09/05/2021   CO2 24 09/05/2021   TSH 2.04 09/05/2021   HGBA1C 5.7 09/05/2021   MICROALBUR 0.9 11/15/2020    US Abdomen Complete  Result Date: 05/26/2021 CLINICAL DATA:  Right flank pain EXAM: ABDOMEN ULTRASOUND COMPLETE COMPARISON:  CT 05/25/2014 FINDINGS: Gallbladder: Surgically absent Common bile duct: Diameter: 9 mm Liver: Appears slightly echogenic. No focal hepatic abnormality is seen. Portal vein is patent on color Doppler imaging with normal direction of blood flow towards the liver. IVC: No abnormality visualized. Pancreas: Visualized portion unremarkable. Spleen: Size and appearance within normal limits. Right Kidney: Length: 12.8 cm. Echogenicity within normal limits. No hydronephrosis. Prominent area isoechoic to normal tissue at the mid aspect of right kidney suspected to represent focally prominent cortical tissue rather than a true mass. Left Kidney: Length: 12.7 cm. Echogenicity within normal limits. No mass or hydronephrosis visualized. Abdominal aorta: No aneurysm visualized. Other findings: None. IMPRESSION: 1. Surgical absence of the gallbladder. 2. Slightly echogenic liver consistent with hepatic steatosis and or hepatocellular disease 3. Negative for hydronephrosis. Electronically Signed   By: Donavan Foil M.D.   On: 05/26/2021 15:26    Assessment & Plan:   Problem List Items Addressed This Visit     Anxiety disorder    Xanax prn Add Effexor XR low dose  Potential benefits of a long term benzodiazepines  use as well as potential risks  and complications were explained to the patient and were aknowledged.      B12 deficiency (Chronic)    Re-start B12      GERD (gastroesophageal reflux disease)   Relevant Medications   pantoprazole (PROTONIX) 40 MG  tablet   Type 2 diabetes mellitus without complication, without long-term current use of insulin (HCC)    Coronary CT calcium score of 0 in 2019      Relevant Medications   Semaglutide, 1 MG/DOSE, 4 MG/3ML SOPN   metFORMIN (GLUCOPHAGE-XR) 500 MG 24 hr tablet   Other Relevant Orders   Comprehensive metabolic panel   Hemoglobin A1c      Meds ordered this encounter  Medications   pantoprazole (PROTONIX) 40 MG tablet    Sig: Take 1 tablet (40 mg total) by mouth daily.    Dispense:  90 tablet    Refill:  3   Semaglutide, 1 MG/DOSE, 4 MG/3ML SOPN    Sig: Inject 1 mg as directed once a week.    Dispense:  3 mL    Refill:  3   metFORMIN (GLUCOPHAGE-XR) 500 MG 24 hr tablet    Sig: Take 1 tablet (500 mg total) by mouth every evening. Take with dinner.    Dispense:  90 tablet    Refill:  3   amphetamine-dextroamphetamine (ADDERALL) 10 MG tablet    Sig: Take 1 tablet (10 mg total) by mouth 2 (two) times daily. breakfast and lunch    Dispense:  60 tablet    Refill:  0    Please fill on or after 03/13/22   amphetamine-dextroamphetamine (ADDERALL) 10 MG tablet    Sig: Take 1 tablet (10 mg total) by mouth 2 (two) times daily.    Dispense:  60 tablet    Refill:  0    Please fill on or after 02/11/22   amphetamine-dextroamphetamine (ADDERALL) 10 MG tablet    Sig: Take 1 tablet (10 mg total) by mouth 2 (two) times daily.    Dispense:  60 tablet    Refill:  0    Please fill on or after 01/12/22      Follow-up: Return in about 4 months (around 05/11/2022) for a follow-up visit.  Walker Kehr, MD

## 2022-01-11 ENCOUNTER — Telehealth: Payer: Self-pay | Admitting: Internal Medicine

## 2022-01-11 ENCOUNTER — Other Ambulatory Visit: Payer: Self-pay

## 2022-01-11 MED ORDER — CYANOCOBALAMIN 1000 MCG/ML IJ SOLN: INTRAMUSCULAR | 3 refills | Status: DC

## 2022-01-11 NOTE — Telephone Encounter (Signed)
Pt is requesting a rx for b12 shot [facility]  [id number] [place]  [phone] Fax:  [phone]      Please advise

## 2022-01-23 ENCOUNTER — Ambulatory Visit
Admission: RE | Admit: 2022-01-23 | Discharge: 2022-01-23 | Disposition: A | Discharge status: Home / Self Care | Payer: BC Managed Care – PPO | Source: Ambulatory Visit | Attending: Internal Medicine | Admitting: Internal Medicine

## 2022-01-23 DIAGNOSIS — Z1231 Encounter for screening mammogram for malignant neoplasm of breast: Secondary | ICD-10-CM | POA: Diagnosis not present

## 2022-02-08 ENCOUNTER — Telehealth: Payer: Self-pay | Admitting: Internal Medicine

## 2022-02-08 NOTE — Telephone Encounter (Signed)
Patient states that she tested positive for Covid last night - she has fever, congestion, etc. And is wanting to know if Dr. Alain Marion can send in the same medicines that he sent in last time - Paxlovid, tessalon perles and an inhaler. Call back number is [phone] and [facility]

## 2022-02-09 MED ORDER — BENZONATATE 200 MG PO CAPS: 200.0000 mg | ORAL_CAPSULE | Freq: Three times a day (TID) | ORAL | 0 refills | Status: DC | PRN

## 2022-02-09 MED ORDER — ALBUTEROL SULFATE HFA 108 (90 BASE) MCG/ACT IN AERS: INHALATION_SPRAY | RESPIRATORY_TRACT | 3 refills | Status: AC

## 2022-02-09 MED ORDER — NIRMATRELVIR/RITONAVIR (PAXLOVID)TABLET: 3.0000 | ORAL_TABLET | Freq: Two times a day (BID) | ORAL | 0 refills | Status: AC

## 2022-02-09 NOTE — Telephone Encounter (Signed)
Okay.  Done.  Feel better! Thanks

## 2022-02-09 NOTE — Telephone Encounter (Signed)
Notified pt MD sent rx to [facility]../lmb 

## 2022-02-20 DIAGNOSIS — L821 Other seborrheic keratosis: Secondary | ICD-10-CM | POA: Diagnosis not present

## 2022-02-20 DIAGNOSIS — Z85828 Personal history of other malignant neoplasm of skin: Secondary | ICD-10-CM | POA: Diagnosis not present

## 2022-02-20 DIAGNOSIS — L570 Actinic keratosis: Secondary | ICD-10-CM | POA: Diagnosis not present

## 2022-02-20 DIAGNOSIS — L814 Other melanin hyperpigmentation: Secondary | ICD-10-CM | POA: Diagnosis not present

## 2022-02-20 DIAGNOSIS — Z85820 Personal history of malignant melanoma of skin: Secondary | ICD-10-CM | POA: Diagnosis not present

## 2022-03-05 ENCOUNTER — Other Ambulatory Visit: Payer: Self-pay | Admitting: Internal Medicine

## 2022-03-15 ENCOUNTER — Other Ambulatory Visit: Payer: Self-pay | Admitting: Internal Medicine

## 2022-03-23 ENCOUNTER — Other Ambulatory Visit: Payer: Self-pay | Admitting: Internal Medicine

## 2022-03-31 ENCOUNTER — Other Ambulatory Visit: Payer: Self-pay | Admitting: Internal Medicine

## 2022-04-12 DIAGNOSIS — G5603 Carpal tunnel syndrome, bilateral upper limbs: Secondary | ICD-10-CM | POA: Diagnosis not present

## 2022-04-12 DIAGNOSIS — G5601 Carpal tunnel syndrome, right upper limb: Secondary | ICD-10-CM | POA: Diagnosis not present

## 2022-05-05 ENCOUNTER — Telehealth: Payer: Self-pay | Admitting: Internal Medicine

## 2022-05-05 NOTE — Telephone Encounter (Signed)
Rec'd form place on MD desk to review.Marland KitchenJohny Chess

## 2022-05-05 NOTE — Telephone Encounter (Signed)
Spouse [employer] brought back Wellness form for work. Form needs signature in Section 3: Signature by lab representative OR provider.  Call [facility] [phone] when ready.  Deadline at work is 05/12/22.

## 2022-05-07 NOTE — Telephone Encounter (Signed)
Okay. Thank you.

## 2022-05-08 ENCOUNTER — Ambulatory Visit: Payer: BC Managed Care – PPO | Admitting: Internal Medicine

## 2022-05-08 NOTE — Telephone Encounter (Signed)
Notified husband form is ready for pick-up.Marland KitchenJohny Lang

## 2022-05-24 ENCOUNTER — Other Ambulatory Visit: Payer: Self-pay | Admitting: Internal Medicine

## 2022-05-24 NOTE — Telephone Encounter (Signed)
Patient called and said that there will need to be an overide done on this medication - it was going to cost her $900 or more.  Patient also needs her pantoprazole sent in.  Libi Corso:  [phone]  Patient would like to be called concerning this.

## 2022-05-25 ENCOUNTER — Other Ambulatory Visit: Payer: Self-pay | Admitting: *Deleted

## 2022-05-25 MED ORDER — PANTOPRAZOLE SODIUM 40 MG PO TBEC: 40.0000 mg | DELAYED_RELEASE_TABLET | Freq: Every day | ORAL | 1 refills | Status: DC

## 2022-05-25 NOTE — Telephone Encounter (Signed)
Alternative Requested:NOT COVERED.

## 2022-05-25 NOTE — Telephone Encounter (Signed)
Need refill on pantoprazole../lm,b

## 2022-05-30 ENCOUNTER — Telehealth: Payer: Self-pay | Admitting: Cardiology

## 2022-05-30 NOTE — Telephone Encounter (Signed)
Spoke to patient she stated she has been having a sensation in left chest for the past 4 to 5 months off and on feels like water running.Stated a strange vibration.No chest pain. No sob.Sensation last appox 2 to 3 sec.Appointment scheduled with [name] NP 1/4 at 2:45 pm at [facility] office.2 year follow up appointment scheduled with Dr.Harding 1/17 at 3:40 pm.

## 2022-05-30 NOTE — Telephone Encounter (Signed)
Pt c/o of Chest Pain: STAT if CP now or developed within 24 hours  1. Are you having CP right now? No   2. Are you experiencing any other symptoms (ex. SOB, nausea, vomiting, sweating)? No   3. How long have you been experiencing CP? Past 4-5 months   4. Is your CP continuous or coming and going? Coming and going   5. Have you taken Nitroglycerin? No  ?  Patient is calling stating for the past 4-5 months she has been having what she described as a vibration/gurgling sensation on the left side of her chest right over her heart. She denies any CP or chest discomfort. States this has become more frequent over the past 4-5 weeks.She does not c/o any other symptoms.  States it has been occurring at night waking her up as well as at work. Please advise.

## 2022-06-01 ENCOUNTER — Ambulatory Visit (HOSPITAL_BASED_OUTPATIENT_CLINIC_OR_DEPARTMENT_OTHER): Payer: BC Managed Care – PPO | Admitting: Family

## 2022-06-01 ENCOUNTER — Encounter (HOSPITAL_BASED_OUTPATIENT_CLINIC_OR_DEPARTMENT_OTHER): Payer: Self-pay | Admitting: Family

## 2022-06-01 VITALS — BP 132/84 | HR 65 | Ht 61.0 in | Wt 150.0 lb

## 2022-06-01 DIAGNOSIS — R0789 Other chest pain: Secondary | ICD-10-CM | POA: Diagnosis not present

## 2022-06-01 DIAGNOSIS — R002 Palpitations: Secondary | ICD-10-CM | POA: Diagnosis not present

## 2022-06-01 DIAGNOSIS — I10 Essential (primary) hypertension: Secondary | ICD-10-CM | POA: Diagnosis not present

## 2022-06-01 NOTE — Progress Notes (Signed)
Office Visit    Patient Name: Amy Lang Date of Encounter: 06/01/2022  PCP:  Cassandria Anger, MD   [place]  Cardiologist:  Glenetta Hew, MD  Advanced Practice Provider:  No care team member to display Electrophysiologist:  None      Chief Complaint    Amy Lang is a 57 y.o. 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[place])    Vitamin B 12 deficiency    Vitamin D deficiency    Past Surgical History:  Procedure Laterality Date   ABDOMINAL HYSTERECTOMY     still has bilateral ovaries   BREAST EXCISIONAL BIOPSY Right 08/27/2017   BREAST LUMPECTOMY WITH RADIOACTIVE SEED LOCALIZATION Right 08/28/2017   Procedure: BREAST LUMPECTOMY WITH RADIOACTIVE SEED LOCALIZATION;  Surgeon: Erroll Luna, MD;  Location: [facility];  Service: General;  Laterality: Right;   CHOLECYSTECTOMY  04-04-11   single site   COLONOSCOPY W/ BIOPSIES  01/24/2013   ESOPHAGOGASTRODUODENOSCOPY     KNEE SURGERY     LAPAROTOMY  05/12/2012   Procedure: LAPAROTOMY;  Surgeon: Cyril Mourning, MD;  Location: [facility];  Service: Gynecology;;  laparotomy [name] salpingectomy-oopharectomy   NM MYOCAR PERF WALL MOTION  09/06/2009   normal   TRANSTHORACIC ECHOCARDIOGRAM  March 2014   Normal regional wall motion. EF 55%. Mild MR    Allergies  Allergies  Allergen Reactions   Bee Pollen Anaphylaxis   Butorphanol    Butorphanol Tartrate Other (See Comments)    REACTION: hallucinations ( stadol)   Codeine    Codeine Sulfate Hives and Nausea And Vomiting   Crestor [Rosuvastatin] Other (See Comments)    Body aches    Darvocet [Propoxyphene N-Acetaminophen] Hives and Nausea And Vomiting   Estrogens    Levofloxacin Other (See Comments)    abdominal pain   Metformin And Related     diarrhea   Penicillamine    Penicillins Other (See Comments)    Seizure    Sulfa Antibiotics Hives and Nausea And Vomiting   Latex Rash    Blisters and redness with bandaids-also sensitivity to latex gloves    History of Present Illness    Amy Lang is a 57 y.o. female with a hx of DM2, hypertension, hyperlipidemia last seen 09/01/19.   Prior 12/2017 cardiac CTA coronary calcium score of 0.  Had established with Dr. Ellyn Hack for family history of coronary disease and workup unremarkable.  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 All four grandparents and her mother passed away of cardiac issues which makes her understandably concerned. Notes issues over the last 5 months progressive over the last 6 weeks. Feels like a speaker vibration or something is running under her skin. Lasts a few seconds. In her left chest also hears a "gurgle" in her left chest almost like her stomach gurgles. No associated dyspnea, pain, dizziness, nausea, syncope.   She started Adderall over the summer for energy and to focus. She takes '5mg'$ . She takes it only  when she is working. Notes her episodes predate the Adderal.   She walks for exercise and does some hand weights.  No exertional chest discomfort nor dyspnea.  She eats mostly at home and does follow a low sodium diet. She does have acid reflux - her acid reflux equivalent  is indigestion which is overall unchanged.   EKGs/Labs/Other Studies Reviewed:   The following studies were reviewed today:   EKG:  EKG is  ordered today.  The ekg ordered today demonstrates NSR 65 bpm with no acute ST/T wave changes.   Recent Labs: 09/05/2021: TSH 2.04 01/09/2022: ALT 20; BUN 21; Creatinine, Ser 0.79; Potassium 4.5; Sodium 141  Recent Lipid Panel    Component Value Date/Time   CHOL 187  11/15/2020 1020   CHOL 173 09/08/2019 1021   TRIG 195.0 (H) 11/15/2020 1020   HDL 54.60 11/15/2020 1020   HDL 50 09/08/2019 1021   CHOLHDL 3 11/15/2020 1020   VLDL 39.0 11/15/2020 1020   LDLCALC 93 11/15/2020 1020   LDLCALC 95 09/08/2019 1021   LDLDIRECT 96.0 10/01/2017 0938   Home Medications   Current Meds  Medication Sig   albuterol (VENTOLIN HFA) 108 (90 Base) MCG/ACT inhaler TAKE 2 PUFFS BY MOUTH EVERY 6 HOURS AS NEEDED FOR WHEEZE OR SHORTNESS OF BREATH   ALPRAZolam (XANAX) 0.25 MG tablet Take 1 tablet (0.25 mg total) by mouth 2 (two) times daily as needed for anxiety (panic attacks).   amphetamine-dextroamphetamine (ADDERALL) 10 MG tablet Take 1 tablet (10 mg total) by mouth 2 (two) times daily. (Patient taking differently: Take 10 mg by mouth 2 (two) times daily. Takes 1/2 tab daily)   aspirin EC 81 MG tablet Take 81 mg by mouth every evening.   benzonatate (TESSALON) 200 MG capsule Take 1 capsule (200 mg total) by mouth 3 (three) times daily as needed for cough.   cetirizine (ZYRTEC) 10 MG tablet Take 10 mg by mouth daily.   Cholecalciferol (VITAMIN D3) 50 MCG (2000 UT) capsule Take 1 capsule (2,000 Units total) by mouth daily.   cyanocobalamin (VITAMIN B12) 1000 MCG/ML injection INJECT 1 ML IM OR SUBQ EVERY 2 WEEKS (LIFETIME)   empagliflozin (JARDIANCE) 10 MG TABS tablet TAKE 1 TABLET (10 MG TOTAL) BY MOUTH DAILY.   FLUoxetine (PROZAC) 40 MG capsule TAKE 1 CAPSULE BY MOUTH EVERY DAY   fluticasone (FLONASE) 50 MCG/ACT nasal spray SPRAY 2 SPRAYS INTO EACH NOSTRIL EVERY DAY   gabapentin (NEURONTIN) 100 MG capsule Take 1-2 capsules (100-200 mg total) by mouth at bedtime. (Patient taking differently: Take 100-200 mg by mouth daily as needed.)   losartan (COZAAR) 50 MG tablet TAKE 2 TABLETS ('100MG'$ ) BY MOUTH EVERY DAY   metFORMIN (GLUCOPHAGE-XR) 500 MG 24 hr tablet Take 1 tablet (500 mg total) by mouth every evening. Take with dinner.   Multiple Vitamins-Minerals (PRESERVISION AREDS 2)  CAPS Take 1 capsule by mouth at bedtime.   Omega-3 Fatty Acids (FISH OIL) 1000 MG CAPS Take 1,000 mg by mouth daily.   ondansetron (ZOFRAN) 4 MG tablet Take 1 tablet (4 mg total) by mouth every 8 (eight) hours as needed for nausea or vomiting.   pantoprazole (PROTONIX) 40 MG tablet Take 1 tablet (40 mg total) by mouth daily.   Probiotic Product (PROBIOTIC ADVANCED PO) Take by mouth daily.   Semaglutide, 1 MG/DOSE, (OZEMPIC, 1 MG/DOSE,) 4 MG/3ML SOPN INJECT 1 MG ONCE A WEEK AS DIRECTED   topiramate (TOPAMAX) 200 MG tablet TAKE 1/2-1 TABLETS BY MOUTH DAILY  venlafaxine XR (EFFEXOR-XR) 37.5 MG 24 hr capsule TAKE 1 CAPSULE BY MOUTH DAILY WITH BREAKFAST.   vitamin C (ASCORBIC ACID) 500 MG tablet Take 500 mg by mouth daily.   vitamin E 1000 UNIT capsule Take 1,000 Units by mouth daily.   Zinc 50 MG CAPS Take by mouth.     Review of Systems      All other systems reviewed and are otherwise negative except as noted above.  Physical Exam    VS:  BP 132/84   Pulse 65   Ht '5\' 1"'$  (1.549 m)   Wt 150 lb (68 kg)   BMI 28.34 kg/m  , BMI Body mass index is 28.34 kg/m.  Wt Readings from Last 3 Encounters:  06/01/22 150 lb (68 kg)  01/09/22 150 lb (68 kg)  09/15/21 156 lb 6.4 oz (70.9 kg)    GEN: Well nourished, well developed, in no acute distress. HEENT: normal. Neck: Supple, no JVD, carotid bruits, or masses. Cardiac: RRR, no murmurs, rubs, or gallops. No clubbing, cyanosis, edema.  Radials/PT 2+ and equal bilaterally.  Respiratory:  Respirations regular and unlabored, clear to auscultation bilaterally. GI: Soft, nontender, nondistended. MS: No deformity or atrophy. Skin: Warm and dry, no rash. Neuro:  Strength and sensation are intact. Psych: Normal affect.  Assessment & Plan    Palpitations / Atypical chest pain -left-sided chest "vibration "or "something running " as well as left chest "gurgle" per her report.  No exertional dyspnea nor chest pain.  Chest pains are atypical for  angina.  We discussed possible etiology vasospasm versus PVC.  She does note recent stress.  EKG today NSR with no arrhythmias and no acute ST/T wave changes.  Reassurance provided.  No indication for cardiac testing at this time.  She will increase hydration, manage stress as best able and follow-up in a few weeks as scheduled with Dr. Ellyn Hack per her preference.  If symptoms persist could consider ZIO. Discussed that adderall could be contributory to symptoms   Anxiety - Continue to follow with PCP. Does note recent stress with rebuilding her home.   HTN - BP reasonably well controlled.  Continue present antihypertensive regimen. Discussed to monitor BP at home at least 2 hours after medications and sitting for 5-10 minutes.          Disposition: Follow up  as scheduled  with Glenetta Hew, MD per her preference  Signed, Loel Dubonnet, NP 06/01/2022, 4:26 PM [place]

## 2022-06-01 NOTE — Patient Instructions (Addendum)
Medication Instructions:  Your Physician recommend you continue on your current medication as directed.    *If you need a refill on your cardiac medications before your next appointment, please call your pharmacy*  Follow-Up: At [facility], you and your health needs are our priority.  As part of our continuing mission to provide you with exceptional heart care, we have created designated Provider Care Teams.  These Care Teams include your primary Cardiologist (physician) and Advanced Practice Providers (APPs -  Physician Assistants and Nurse Practitioners) who all work together to provide you with the care you need, when you need it.  We recommend signing up for the patient portal called "MyChart".  Sign up information is provided on this After Visit Summary.  MyChart is used to connect with patients for Virtual Visits (Telemedicine).  Patients are able to view lab/test results, encounter notes, upcoming appointments, etc.  Non-urgent messages can be sent to your provider as well.   To learn more about what you can do with MyChart, go to [email or web address].    Your next appointment:   Follow up scheduled   Other Instructions To prevent palpitations: Make sure you are adequately hydrated.  Avoid and/or limit caffeine containing beverages like soda or tea. Exercise regularly.  Manage stress well. Some over the counter medications can cause palpitations such as Benadryl, AdvilPM, TylenolPM. Regular Advil or Tylenol do not cause palpitations.    Premature Ventricular Contraction  A premature ventricular contraction (PVC) is a type of irregular heartbeat (arrhythmia). The heart has four chambers, including the upper chambers (atria) and lower chambers (ventricles). Normally, an electrical signal starts in a group of cells called the sinoatrial node (SA node) and travels through the atria, causing them to pump blood into the ventricles. During a PVC, the heartbeat starts in one of  the lower ventricles. This may cause the heartbeat to be shorter and less effective. In most cases, PVCs come and go and do not require treatment. What increases the risk? The following factors may make you more likely to develop this condition: Age, especially being over age 75. Being female. An imbalance of salts and minerals in the body (electrolytes). Low blood oxygen levels or high carbon dioxide levels. Certain medicines, including over-the-counter and prescribed medicines. High blood pressure. Obesity. Episodes may be triggered by: Vigorous exercise. Tobacco, alcohol, or caffeine use. Illegal drug use. Emotional stress. Poor or irregular sleep. What are the signs or symptoms? The main symptoms of this condition are fast or irregular heartbeats (palpitations) or the feeling of a pause in the heartbeat. Other symptoms include: Shortness of breath. Difficulty exercising. Chest pain. Feeling tired. Dizziness. In some cases, there are no symptoms. Possible treatments include: Avoiding things that cause PVCs (triggers). These include caffeine, tobacco, and alcohol. Taking medicines if symptoms are severe or if the arrhythmias happen a lot. Getting treatment for underlying conditions that cause PVCs. Having an implantable cardioverter defibrillator (ICD) placed in the chest to monitor the heartbeat. The monitor sends a shock to the heart if it senses an arrhythmia and brings the heartbeat back to normal. Having a catheter ablation procedure to destroy the part of the heart tissue that sends abnormal signals. In many cases, no treatment is required.

## 2022-06-05 ENCOUNTER — Ambulatory Visit (INDEPENDENT_AMBULATORY_CARE_PROVIDER_SITE_OTHER): Payer: BC Managed Care – PPO | Admitting: Internal Medicine

## 2022-06-05 ENCOUNTER — Encounter: Payer: Self-pay | Admitting: Internal Medicine

## 2022-06-05 VITALS — BP 122/74 | HR 72 | Temp 97.8°F | Ht 61.0 in | Wt 149.0 lb

## 2022-06-05 DIAGNOSIS — E538 Deficiency of other specified B group vitamins: Secondary | ICD-10-CM | POA: Diagnosis not present

## 2022-06-05 DIAGNOSIS — R0789 Other chest pain: Secondary | ICD-10-CM | POA: Diagnosis not present

## 2022-06-05 DIAGNOSIS — J4520 Mild intermittent asthma, uncomplicated: Secondary | ICD-10-CM

## 2022-06-05 DIAGNOSIS — E119 Type 2 diabetes mellitus without complications: Secondary | ICD-10-CM

## 2022-06-05 DIAGNOSIS — K219 Gastro-esophageal reflux disease without esophagitis: Secondary | ICD-10-CM

## 2022-06-05 LAB — COMPREHENSIVE METABOLIC PANEL
ALT: 18 U/L (ref 0–35)
AST: 17 U/L (ref 0–37)
Albumin: 4.4 g/dL (ref 3.5–5.2)
Alkaline Phosphatase: 77 U/L (ref 39–117)
BUN: 20 mg/dL (ref 6–23)
CO2: 23 mEq/L (ref 19–32)
Calcium: 9.2 mg/dL (ref 8.4–10.5)
Chloride: 110 mEq/L (ref 96–112)
Creatinine, Ser: 0.7 mg/dL (ref 0.40–1.20)
GFR: 95.19 mL/min (ref >60.00)
Glucose, Bld: 114 mg/dL — ABNORMAL HIGH (ref 70–99)
Potassium: 4.2 mEq/L (ref 3.5–5.1)
Sodium: 140 mEq/L (ref 135–145)
Total Bilirubin: 0.3 mg/dL (ref 0.2–1.2)
Total Protein: 7.2 g/dL (ref 6.0–8.3)

## 2022-06-05 LAB — HEMOGLOBIN A1C: Hgb A1c MFr Bld: 5.6 % (ref 4.6–6.5)

## 2022-06-05 MED ORDER — PANTOPRAZOLE SODIUM 40 MG PO TBEC: 40.0000 mg | DELAYED_RELEASE_TABLET | Freq: Every day | ORAL | 3 refills | Status: AC

## 2022-06-05 MED ORDER — OZEMPIC (1 MG/DOSE) 4 MG/3ML ~~LOC~~ SOPN: PEN_INJECTOR | SUBCUTANEOUS | 3 refills | Status: DC

## 2022-06-05 NOTE — Assessment & Plan Note (Signed)
On Protonix 40 mg qd

## 2022-06-05 NOTE — Assessment & Plan Note (Signed)
Metformin 500 mg XR a day On Ozempic - may need a PA

## 2022-06-05 NOTE — Assessment & Plan Note (Signed)
No sx's 

## 2022-06-05 NOTE — Assessment & Plan Note (Signed)
Noncardiac

## 2022-06-05 NOTE — Progress Notes (Signed)
Subjective:  Patient ID: Amy Lang, female    DOB: 15-Aug-1965  Age: 57 y.o. MRN: [id number]  CC: Follow-up (Having problem with pantoprazole and ozempic)   HPI Amy Lang presents for DM, HTN, GERD f/u  Outpatient Medications Prior to Visit  Medication Sig Dispense Refill   albuterol (VENTOLIN HFA) 108 (90 Base) MCG/ACT inhaler TAKE 2 PUFFS BY MOUTH EVERY 6 HOURS AS NEEDED FOR WHEEZE OR SHORTNESS OF BREATH 54 each 3   ALPRAZolam (XANAX) 0.25 MG tablet Take 1 tablet (0.25 mg total) by mouth 2 (two) times daily as needed for anxiety (panic attacks). 60 tablet 1   amphetamine-dextroamphetamine (ADDERALL) 10 MG tablet Take 1 tablet (10 mg total) by mouth 2 (two) times daily. (Patient taking differently: Take 10 mg by mouth 2 (two) times daily. Takes 1/2 tab daily) 60 tablet 0   aspirin EC 81 MG tablet Take 81 mg by mouth every evening.     benzonatate (TESSALON) 200 MG capsule Take 1 capsule (200 mg total) by mouth 3 (three) times daily as needed for cough. 60 capsule 0   cetirizine (ZYRTEC) 10 MG tablet Take 10 mg by mouth daily.     Cholecalciferol (VITAMIN D3) 50 MCG (2000 UT) capsule Take 1 capsule (2,000 Units total) by mouth daily. 100 capsule 3   cyanocobalamin (VITAMIN B12) 1000 MCG/ML injection INJECT 1 ML IM OR SUBQ EVERY 2 WEEKS (LIFETIME) 6 mL 3   empagliflozin (JARDIANCE) 10 MG TABS tablet TAKE 1 TABLET (10 MG TOTAL) BY MOUTH DAILY. 90 tablet 1   FLUoxetine (PROZAC) 40 MG capsule TAKE 1 CAPSULE BY MOUTH EVERY DAY 90 capsule 1   fluticasone (FLONASE) 50 MCG/ACT nasal spray SPRAY 2 SPRAYS INTO EACH NOSTRIL EVERY DAY 48 mL 3   gabapentin (NEURONTIN) 100 MG capsule Take 1-2 capsules (100-200 mg total) by mouth at bedtime. (Patient taking differently: Take 100-200 mg by mouth daily as needed.) 180 capsule 1   losartan (COZAAR) 50 MG tablet TAKE 2 TABLETS ('100MG'$ ) BY MOUTH EVERY DAY 180 tablet 1   metFORMIN (GLUCOPHAGE-XR) 500 MG 24 hr tablet Take 1 tablet (500 mg total) by mouth  every evening. Take with dinner. 90 tablet 3   Multiple Vitamins-Minerals (PRESERVISION AREDS 2) CAPS Take 1 capsule by mouth at bedtime.     Omega-3 Fatty Acids (FISH OIL) 1000 MG CAPS Take 1,000 mg by mouth daily.     ondansetron (ZOFRAN) 4 MG tablet Take 1 tablet (4 mg total) by mouth every 8 (eight) hours as needed for nausea or vomiting. 20 tablet 0   Probiotic Product (PROBIOTIC ADVANCED PO) Take by mouth daily.     topiramate (TOPAMAX) 200 MG tablet TAKE 1/2-1 TABLETS BY MOUTH DAILY 90 tablet 1   venlafaxine XR (EFFEXOR-XR) 37.5 MG 24 hr capsule TAKE 1 CAPSULE BY MOUTH DAILY WITH BREAKFAST. 90 capsule 2   vitamin C (ASCORBIC ACID) 500 MG tablet Take 500 mg by mouth daily.     vitamin E 1000 UNIT capsule Take 1,000 Units by mouth daily.     Zinc 50 MG CAPS Take by mouth.     pantoprazole (PROTONIX) 40 MG tablet Take 1 tablet (40 mg total) by mouth daily. 90 tablet 1   Semaglutide, 1 MG/DOSE, (OZEMPIC, 1 MG/DOSE,) 4 MG/3ML SOPN INJECT 1 MG ONCE A WEEK AS DIRECTED 3 mL 3   No facility-administered medications prior to visit.    ROS: Review of Systems  Constitutional:  Negative for activity change, appetite change, chills,  fatigue and unexpected weight change.  HENT:  Negative for congestion, mouth sores and sinus pressure.   Eyes:  Negative for visual disturbance.  Respiratory:  Negative for cough and chest tightness.   Gastrointestinal:  Negative for abdominal pain and nausea.  Genitourinary:  Negative for difficulty urinating, frequency and vaginal pain.  Musculoskeletal:  Negative for back pain and gait problem.  Skin:  Negative for pallor and rash.  Neurological:  Negative for dizziness, tremors, weakness, numbness and headaches.  Psychiatric/Behavioral:  Negative for confusion and sleep disturbance.     Objective:  BP 122/74 (BP Location: Left Arm, Patient Position: Sitting, Cuff Size: Normal)   Pulse 72   Temp 97.8 F (36.6 C) (Oral)   Ht '5\' 1"'$  (1.549 m)   Wt 149 lb  (67.6 kg)   SpO2 100%   BMI 28.15 kg/m   BP Readings from Last 3 Encounters:  06/05/22 122/74  06/01/22 132/84  01/09/22 110/70    Wt Readings from Last 3 Encounters:  06/05/22 149 lb (67.6 kg)  06/01/22 150 lb (68 kg)  01/09/22 150 lb (68 kg)    Physical Exam Constitutional:      General: She is not in acute distress.    Appearance: She is well-developed. She is obese.  HENT:     Head: Normocephalic.     Right Ear: External ear normal.     Left Ear: External ear normal.     Nose: Nose normal.  Eyes:     General:        Right eye: No discharge.        Left eye: No discharge.     Conjunctiva/sclera: Conjunctivae normal.     Pupils: Pupils are equal, round, and reactive to light.  Neck:     Thyroid: No thyromegaly.     Vascular: No JVD.     Trachea: No tracheal deviation.  Cardiovascular:     Rate and Rhythm: Normal rate and regular rhythm.     Heart sounds: Normal heart sounds.  Pulmonary:     Effort: No respiratory distress.     Breath sounds: No stridor. No wheezing.  Abdominal:     General: Bowel sounds are normal. There is no distension.     Palpations: Abdomen is soft. There is no mass.     Tenderness: There is no abdominal tenderness. There is no guarding or rebound.  Musculoskeletal:        General: No tenderness.     Cervical back: Normal range of motion and neck supple. No rigidity.  Lymphadenopathy:     Cervical: No cervical adenopathy.  Skin:    Findings: No erythema or rash.  Neurological:     Mental Status: She is oriented to person, place, and time.     Cranial Nerves: No cranial nerve deficit.     Motor: No abnormal muscle tone.     Coordination: Coordination normal.     Deep Tendon Reflexes: Reflexes normal.  Psychiatric:        Behavior: Behavior normal.        Thought Content: Thought content normal.        Judgment: Judgment normal.     Lab Results  Component Value Date   WBC 8.7 05/24/2021   HGB 14.6 05/24/2021   HCT 43.5  05/24/2021   PLT 193.0 05/24/2021   GLUCOSE 95 01/09/2022   CHOL 187 11/15/2020   TRIG 195.0 (H) 11/15/2020   HDL 54.60 11/15/2020   LDLDIRECT 96.0 10/01/2017  LDLCALC 93 11/15/2020   ALT 20 01/09/2022   AST 19 01/09/2022   NA 141 01/09/2022   K 4.5 01/09/2022   CL 110 01/09/2022   CREATININE 0.79 01/09/2022   BUN 21 01/09/2022   CO2 24 01/09/2022   TSH 2.04 09/05/2021   HGBA1C 5.6 01/09/2022   MICROALBUR 0.9 11/15/2020    MM 3D SCREEN BREAST BILATERAL  Result Date: 01/25/2022 CLINICAL DATA:  Screening. EXAM: DIGITAL SCREENING BILATERAL MAMMOGRAM WITH TOMOSYNTHESIS AND CAD TECHNIQUE: Bilateral screening digital craniocaudal and mediolateral oblique mammograms were obtained. Bilateral screening digital breast tomosynthesis was performed. The images were evaluated with computer-aided detection. COMPARISON:  Previous exam(s). ACR Breast Density Category c: The breast tissue is heterogeneously dense, which may obscure small masses. FINDINGS: There are no findings suspicious for malignancy. IMPRESSION: No mammographic evidence of malignancy. A result letter of this screening mammogram will be mailed directly to the patient. RECOMMENDATION: Screening mammogram in one year. (Code:SM-B-01Y) BI-RADS CATEGORY  1: Negative. Electronically Signed   By: Claudie Revering M.D.   On: 01/25/2022 12:54    Assessment & Plan:   Problem List Items Addressed This Visit       Digestive   GERD (gastroesophageal reflux disease) - Primary    On Protonix 40 mg qd      Relevant Medications   pantoprazole (PROTONIX) 40 MG tablet     Endocrine   Type 2 diabetes mellitus without complication, without long-term current use of insulin (HCC)    Metformin 500 mg XR a day On Ozempic - may need a PA      Relevant Medications   Semaglutide, 1 MG/DOSE, (OZEMPIC, 1 MG/DOSE,) 4 MG/3ML SOPN      Meds ordered this encounter  Medications   Semaglutide, 1 MG/DOSE, (OZEMPIC, 1 MG/DOSE,) 4 MG/3ML SOPN    Sig:  INJECT 1 MG ONCE A WEEK AS DIRECTED    Dispense:  9 mL    Refill:  3   pantoprazole (PROTONIX) 40 MG tablet    Sig: Take 1 tablet (40 mg total) by mouth daily.    Dispense:  90 tablet    Refill:  3      Follow-up: Return in about 4 months (around 10/04/2022) for a follow-up visit.  Walker Kehr, MD

## 2022-06-05 NOTE — Assessment & Plan Note (Signed)
On B12 

## 2022-06-14 ENCOUNTER — Ambulatory Visit: Payer: BC Managed Care – PPO | Attending: Cardiology | Admitting: Cardiology

## 2022-06-14 ENCOUNTER — Ambulatory Visit (INDEPENDENT_AMBULATORY_CARE_PROVIDER_SITE_OTHER): Payer: BC Managed Care – PPO

## 2022-06-14 ENCOUNTER — Encounter: Payer: Self-pay | Admitting: Cardiology

## 2022-06-14 VITALS — BP 128/72 | HR 68 | Ht 61.0 in | Wt 150.8 lb

## 2022-06-14 DIAGNOSIS — I10 Essential (primary) hypertension: Secondary | ICD-10-CM | POA: Diagnosis not present

## 2022-06-14 DIAGNOSIS — E8881 Metabolic syndrome: Secondary | ICD-10-CM | POA: Diagnosis not present

## 2022-06-14 DIAGNOSIS — R002 Palpitations: Secondary | ICD-10-CM

## 2022-06-14 NOTE — Progress Notes (Signed)
Primary Care Provider: Cassandria Anger, MD [place] Cardiologist: Glenetta Hew, MD Electrophysiologist: None  Clinic Note: Chief Complaint  Patient presents with   2 Week Follow-up    Still having the unusual sensations in his chest.  No chest pain   ===================================  ASSESSMENT/PLAN   Problem List Items Addressed This Visit       Cardiology Problems   Essential hypertension (Chronic)    Well-controlled BP on ARB.        Other   Fluttering sensation of heart - Primary    For lack of better term I think fluttering sensation makes sense.  Is very unusual description of symptoms.  Does not seem to be palpitations, effective seems more like muscular fasciculations.  However it is not unreasonable to evaluate for arrhythmia.  I think this would be a more peace of mind.  Plan: 2-week Zio patch      Relevant Orders   LONG TERM MONITOR (4-13 DAYS)   Metabolic syndrome (Chronic)    No longer obese so she has lost weight.  She is on Ozempic and Jardiance along with metformin for diabetes.  Last A1c is 5.6.  Blood pressure is well-controlled.       ===================================  HPI:    Amy Lang is a 57 y.o. female with a CRF HTN, HLD, DM-2 and Obesity (Metabolic Syndrome) with Coronary Calcium Score of 0 (2019), and Strong Family History of Premature CAD below who presents today for 2-3 week f/u of Unusual Chest Symptoms at the request of Plotnikov, Evie Lacks, MD.  I last saw Andersen in April 2021 for routine follow-up-we have been planning 1 or 2-year follow-ups because of family history of CAD.  Amy Lang  called in on May 30, 2022 with a "crawling sensation" on the left side of her chest for the last several months.  Described as almost like a vibration or water trickling.  She was seen by [name], NP on January 4 for evaluation of the symptoms.  She describes these episodes like a speaker vibrating on her chest  or water running under her skin like a gurgling sensation.  There is no other associated symptoms.  Not a chest discomfort.  She indicated that the symptoms did not occur with exertion-she walks and does hand weights for exercise.  She is taking medication for reflux and indigestion, but is stable. => We talked about potential etiologies including spasm or PVCs.  She was reassured and told to increase hydration and monitor closely.  Manage stress.  Plan was to see her back in short follow-up.  Recent Hospitalizations: None  Reviewed  CV studies:    The following studies were reviewed today: (if available, images/films reviewed: From Epic Chart or Care Everywhere) No recent studies.:  Interval History:   Amy Lang returns today for close follow-up noting that she still having these episodes that are just annoying to her.  She has been under quite a bit of stress because of issues with their house.  Apparently most of the issues were due to builder error, unfortunately they are beyond the statute of limitations for the builder to be responsible and therefore they are having to pay for all the cost of rebuilding their house.  She has had to sell her inheritance (which was a plot of land planned retirement) in order to pay for rebuilding their house.  She has been under quite a bit of stress dealing with legal issues and  the financial concerns.[employer], she is not having any chest pain or pressure with rest or exertion.  She may get a little bit of exertional dyspnea because she is deconditioned and has been exercising.  She has not had any syncope or near syncope.  No TIA or amaurosis fugax. She really does not describe irregular heartbeats or palpitations or pounding heartbeats which she just describes that fluttering or crawling sensation on her skin with a gushing sensation.  Very unusual.  She says that it may be worse with caffeine but she has not been drinking much caffeine lately.  Says  it probably happens about once a week.  CV Review of Symptoms (Summary): no chest pain or dyspnea on exertion positive for - atypical sensation in the chest which could be palpitations or muscle fasciculations. negative for - edema, irregular heartbeat, orthopnea, paroxysmal nocturnal dyspnea, rapid heart rate, shortness of breath, or syncope or near syncope, TIA/amaurosis fugax, claudication  REVIEWED OF SYSTEMS   Review of Systems  Constitutional:  Positive for malaise/fatigue (Somewhat deconditioned.  Not very active.). Negative for weight loss.  HENT:  Negative for congestion.   Respiratory:  Negative for cough, shortness of breath and wheezing.   Cardiovascular:        Per HPI  Gastrointestinal:  Negative for blood in stool and melena.  Genitourinary:  Negative for hematuria.  Musculoskeletal:  Negative for falls and joint pain.  Neurological:  Negative for dizziness and focal weakness.  Psychiatric/Behavioral:  Negative for depression and memory loss. The patient is nervous/anxious. The patient does not have insomnia.     I have reviewed and (if needed) personally updated the patient's problem list, medications, allergies, past medical and surgical history, social and family history.   PAST MEDICAL HISTORY   Past Medical History:  Diagnosis Date   Allergy    Anxiety    Asthma    Depression    Diabetes ([place])    Dyslipidemia    Hypertension    IBS (irritable bowel syndrome)    Lactose intolerance    Migraines    Obesity    PONV (postoperative nausea and vomiting)    Shortness of breath    Vaginal cancer ([place])    Vitamin B 12 deficiency    Vitamin D deficiency     PAST SURGICAL HISTORY   Past Surgical History:  Procedure Laterality Date   ABDOMINAL HYSTERECTOMY     still has bilateral ovaries   BREAST EXCISIONAL BIOPSY Right 08/27/2017   BREAST LUMPECTOMY WITH RADIOACTIVE SEED LOCALIZATION Right 08/28/2017   Procedure: BREAST LUMPECTOMY WITH RADIOACTIVE SEED  LOCALIZATION;  Surgeon: Erroll Luna, MD;  Location: [facility];  Service: General;  Laterality: Right;   CHOLECYSTECTOMY  04-04-11   single site   COLONOSCOPY W/ BIOPSIES  01/24/2013   ESOPHAGOGASTRODUODENOSCOPY     KNEE SURGERY     LAPAROTOMY  05/12/2012   Procedure: LAPAROTOMY;  Surgeon: Cyril Mourning, MD;  Location: [facility];  Service: Gynecology;;  laparotomy [name] salpingectomy-oopharectomy   NM MYOCAR PERF WALL MOTION  09/06/2009   normal   TRANSTHORACIC ECHOCARDIOGRAM  March 2014   Normal regional wall motion. EF 55%. Mild MR   01/07/2018: Coronary Calcium Score 0.  Immunization History  Administered Date(s) Administered   Influenza Inj Mdck Quad Pf 04/26/2020, 02/18/2021   Influenza Split 04/15/2012   Influenza Whole 03/08/2009   Influenza,inj,Quad PF,6+ Mos 03/29/2018, 02/03/2019, 03/14/2022   Influenza-Unspecified 04/26/2020   PFIZER(Purple Top)SARS-COV-2 Vaccination 08/09/2019, 09/09/2019, 03/27/2020  Pension scheme manager 51yr & up 02/18/2021   Pneumococcal Conjugate-13 08/20/2017   Tdap 04/15/2012   Zoster Recombinat (Shingrix) 10/01/2017, 12/17/2017    MEDICATIONS/ALLERGIES   Current Meds  Medication Sig   albuterol (VENTOLIN HFA) 108 (90 Base) MCG/ACT inhaler TAKE 2 PUFFS BY MOUTH EVERY 6 HOURS AS NEEDED FOR WHEEZE OR SHORTNESS OF BREATH   ALPRAZolam (XANAX) 0.25 MG tablet Take 1 tablet (0.25 mg total) by mouth 2 (two) times daily as needed for anxiety (panic attacks).   amphetamine-dextroamphetamine (ADDERALL) 10 MG tablet Take 1 tablet (10 mg total) by mouth 2 (two) times daily. (Patient taking differently: Take 10 mg by mouth 2 (two) times daily. Takes 1/2 tab daily)   aspirin EC 81 MG tablet Take 81 mg by mouth every evening.   cetirizine (ZYRTEC) 10 MG tablet Take 10 mg by mouth daily.   Cholecalciferol (VITAMIN D3) 50 MCG (2000 UT) capsule Take 1 capsule (2,000 Units total) by mouth daily.   cyanocobalamin (VITAMIN  B12) 1000 MCG/ML injection INJECT 1 ML IM OR SUBQ EVERY 2 WEEKS (LIFETIME)   empagliflozin (JARDIANCE) 10 MG TABS tablet TAKE 1 TABLET (10 MG TOTAL) BY MOUTH DAILY.   FLUoxetine (PROZAC) 40 MG capsule TAKE 1 CAPSULE BY MOUTH EVERY DAY   fluticasone (FLONASE) 50 MCG/ACT nasal spray SPRAY 2 SPRAYS INTO EACH NOSTRIL EVERY DAY   gabapentin (NEURONTIN) 100 MG capsule Take 1-2 capsules (100-200 mg total) by mouth at bedtime. (Patient taking differently: Take 100-200 mg by mouth daily as needed.)   losartan (COZAAR) 50 MG tablet TAKE 2 TABLETS ('100MG'$ ) BY MOUTH EVERY DAY   metFORMIN (GLUCOPHAGE-XR) 500 MG 24 hr tablet Take 1 tablet (500 mg total) by mouth every evening. Take with dinner.   Multiple Vitamins-Minerals (PRESERVISION AREDS 2) CAPS Take 1 capsule by mouth at bedtime.   Omega-3 Fatty Acids (FISH OIL) 1000 MG CAPS Take 1,000 mg by mouth daily.   ondansetron (ZOFRAN) 4 MG tablet Take 1 tablet (4 mg total) by mouth every 8 (eight) hours as needed for nausea or vomiting.   pantoprazole (PROTONIX) 40 MG tablet Take 1 tablet (40 mg total) by mouth daily.   Probiotic Product (PROBIOTIC ADVANCED PO) Take by mouth daily.   Semaglutide, 1 MG/DOSE, (OZEMPIC, 1 MG/DOSE,) 4 MG/3ML SOPN INJECT 1 MG ONCE A WEEK AS DIRECTED   topiramate (TOPAMAX) 200 MG tablet TAKE 1/2-1 TABLETS BY MOUTH DAILY   venlafaxine XR (EFFEXOR-XR) 37.5 MG 24 hr capsule TAKE 1 CAPSULE BY MOUTH DAILY WITH BREAKFAST.   vitamin C (ASCORBIC ACID) 500 MG tablet Take 500 mg by mouth daily.   vitamin E 1000 UNIT capsule Take 1,000 Units by mouth daily.   Zinc 50 MG CAPS Take by mouth.   [DISCONTINUED] benzonatate (TESSALON) 200 MG capsule Take 1 capsule (200 mg total) by mouth 3 (three) times daily as needed for cough.    Allergies  Allergen Reactions   Bee Pollen Anaphylaxis   Butorphanol    Butorphanol Tartrate Other (See Comments)    REACTION: hallucinations ( stadol)   Codeine    Codeine Sulfate Hives and Nausea And Vomiting    Crestor [Rosuvastatin] Other (See Comments)    Body aches   Darvocet [Propoxyphene N-Acetaminophen] Hives and Nausea And Vomiting   Estrogens    Levofloxacin Other (See Comments)    abdominal pain   Metformin And Related     diarrhea   Penicillamine    Penicillins Other (See Comments)    Seizure  Sulfa Antibiotics Hives, Nausea And Vomiting and Other (See Comments)   Latex Rash    Blisters and redness with bandaids-also sensitivity to latex gloves    SOCIAL HISTORY/FAMILY HISTORY   Reviewed in Epic:  Pertinent findings:  Social History   Tobacco Use   Smoking status: Never   Smokeless tobacco: Never   Tobacco comments:    Regular Exercise - No  Vaping Use   Vaping Use: Never used  Substance Use Topics   Alcohol use: No   Drug use: No   Social History   Social History Narrative   [name] with no children. Never smoked. Occasional alcohol.   Works as a Haematologist at [employer]. Is on her feet but 6 days per week.   She does a treadmill at home and walking outside of these 3-4 times a week.    OBJCTIVE -PE, EKG, labs   Wt Readings from Last 3 Encounters:  06/14/22 150 lb 12.8 oz (68.4 kg)  06/05/22 149 lb (67.6 kg)  06/01/22 150 lb (68 kg)    Physical Exam: BP 128/72   Pulse 68   Ht '5\' 1"'$  (1.549 m)   Wt 150 lb 12.8 oz (68.4 kg)   SpO2 95%   BMI 28.49 kg/m  Physical Exam Vitals reviewed.  Constitutional:      General: She is not in acute distress.    Appearance: Normal appearance. She is normal weight. She is not ill-appearing or toxic-appearing.  HENT:     Head: Normocephalic and atraumatic.  Neck:     Vascular: No carotid bruit.  Cardiovascular:     Rate and Rhythm: Normal rate and regular rhythm.     Pulses: Normal pulses.     Heart sounds: Normal heart sounds.  Pulmonary:     Effort: Pulmonary effort is normal. No respiratory distress.     Breath sounds: Normal breath sounds. No wheezing, rhonchi or rales.  Chest:      Chest wall: No tenderness.  Musculoskeletal:        General: No swelling. Normal range of motion.     Cervical back: Normal range of motion and neck supple.  Skin:    General: Skin is warm and dry.  Neurological:     General: No focal deficit present.     Mental Status: She is alert and oriented to person, place, and time.     Gait: Gait normal.  Psychiatric:        Mood and Affect: Mood normal.        Behavior: Behavior normal.        Thought Content: Thought content normal.        Judgment: Judgment normal.     Adult ECG Report Not checked  Recent Labs: Reviewed. Lab Results  Component Value Date   CHOL 187 11/15/2020   HDL 54.60 11/15/2020   LDLCALC 93 11/15/2020   LDLDIRECT 96.0 10/01/2017   TRIG 195.0 (H) 11/15/2020   CHOLHDL 3 11/15/2020   Lab Results  Component Value Date   CREATININE 0.70 06/05/2022   BUN 20 06/05/2022   NA 140 06/05/2022   K 4.2 06/05/2022   CL 110 06/05/2022   CO2 23 06/05/2022      Latest Ref Rng & Units 05/24/2021    4:13 PM 11/15/2020   10:20 AM 08/05/2018   10:29 AM  CBC  WBC 4.0 - 10.5 K/uL 8.7  5.7  5.8   Hemoglobin 12.0 - 15.0 g/dL 14.6  15.2  14.8   Hematocrit 36.0 - 46.0 % 43.5  44.8  44.9   Platelets 150.0 - 400.0 K/uL 193.0  164.0      Lab Results  Component Value Date   HGBA1C 5.6 06/05/2022   Lab Results  Component Value Date   TSH 2.04 09/05/2021    ================================================== I spent a total of 17 minutes with the patient spent in direct patient consultation.  Additional time spent with chart review  / charting (studies, outside notes, etc): 15 min Total Time: 32 min  Current medicines are reviewed at length with the patient today.  (+/- concerns) N/A  Notice: This dictation was prepared with Dragon dictation along with smart phrase technology. Any transcriptional errors that result from this process are unintentional and may not be corrected upon review.  Studies Ordered:   Orders  Placed This Encounter  Procedures   LONG TERM MONITOR (3-14 DAYS)   No orders of the defined types were placed in this encounter.   Patient Instructions / Medication Changes & Studies & Tests Ordered   Patient Instructions  Medication Instructions:  No changes   *If you need a refill on your cardiac medications before your next appointment, please call your pharmacy*   Lab Work: Not needed    Testing/Procedures: Will be mailed to you in 3 to 5 days Your physician has recommended that you wear a holter monitor 14 day ZIO . Holter monitors are medical devices that record the heart's electrical activity. Doctors most often use these monitors to diagnose arrhythmias. Arrhythmias are problems with the speed or rhythm of the heartbeat. The monitor is a small, portable device. You can wear one while you do your normal daily activities. This is usually used to diagnose what is causing palpitations/syncope (passing out).   Follow-Up: At [facility], you and your health needs are our priority.  As part of our continuing mission to provide you with exceptional heart care, we have created designated Provider Care Teams.  These Care Teams include your primary Cardiologist (physician) and Advanced Practice Providers (APPs -  Physician Assistants and Nurse Practitioners) who all work together to provide you with the care you need, when you need it.     Your next appointment:   3 to 4  month(s)  The format for your next appointment:   Virtual or in person   Provider:   Glenetta Hew, MD    Other Instructions  ZIO XT- Long Term Monitor Instructions  Your physician has requested you wear a ZIO patch monitor for 14 days.  This is a single patch monitor. [employer] supplies one patch monitor per enrollment. Additional stickers are not available. Please do not apply patch if you will be having a Nuclear Stress Test,  Echocardiogram, Cardiac CT, MRI, or Chest Xray during the period you would  be wearing the  monitor. The patch cannot be worn during these tests. You cannot remove and re-apply the  ZIO XT patch monitor.  Your ZIO patch monitor will be mailed 3 day USPS to your address on file. It may take 3-5 days  to receive your monitor after you have been enrolled.  Once you have received your monitor, please review the enclosed instructions. Your monitor  has already been registered assigning a specific monitor serial # to you.  Billing and Patient Assistance Program Information  We have supplied [employer] with any of your insurance information on file for billing purposes. [employer] offers a sliding scale Patient Assistance Program for patients  that do not have  insurance, or whose insurance does not completely cover the cost of the ZIO monitor.  You must apply for the Patient Assistance Program to qualify for this discounted rate.  To apply, please call [employer] at [phone], select option 4, select option 2, ask to apply for  Patient Assistance Program. Theodore Demark will ask your household income, and how many people  are in your household. They will quote your out-of-pocket cost based on that information.  [employer] will also be able to set up a 52-month interest-free payment plan if needed.  Applying the monitor   Shave hair from upper left chest.  Hold abrader disc by orange tab. Rub abrader in 40 strokes over the upper left chest as  indicated in your monitor instructions.  Clean area with 4 enclosed alcohol pads. Let dry.  Apply patch as indicated in monitor instructions. Patch will be placed under collarbone on left  side of chest with arrow pointing upward.  Rub patch adhesive wings for 2 minutes. Remove white label marked "1". Remove the white  label marked "2". Rub patch adhesive wings for 2 additional minutes.  While looking in a mirror, press and release button in center of patch. A small green light will  flash 3-4 times. This will be your only indicator that the  monitor has been turned on.  Do not shower for the first 24 hours. You may shower after the first 24 hours.  Press the button if you feel a symptom. You will hear a small click. Record Date, Time and  Symptom in the Patient Logbook.  When you are ready to remove the patch, follow instructions on the last 2 pages of Patient  Logbook. Stick patch monitor onto the last page of Patient Logbook.  Place Patient Logbook in the blue and white box. Use locking tab on box and tape box closed  securely. The blue and white box has prepaid postage on it. Please place it in the mailbox as  soon as possible. Your physician should have your test results approximately 7 days after the  [facility]  Call [facility] [phone] you have questions regarding  your ZIO XT patch monitor. Call them immediately if you see an orange light blinking on your  monitor.  If your monitor falls off in less than 4 days, contact our Monitor department at [phone]  If your monitor becomes loose or falls off after 4 days call [employer] at [phone]  suggestions on securing your monitor      DLeonie Man MD, MS DGlenetta Hew M.D., M.S. Interventional Cardiologist  [place] Pager # [phone] # [phone] [place] [address]  Thank you for choosing [facility]!

## 2022-06-14 NOTE — Progress Notes (Unsigned)
Enrolled patient for a 14 day Zio XT  monitor to be mailed to patients home  °

## 2022-06-14 NOTE — Patient Instructions (Signed)
Medication Instructions:  No changes   *If you need a refill on your cardiac medications before your next appointment, please call your pharmacy*   Lab Work: Not needed    Testing/Procedures: Will be mailed to you in 3 to 5 days Your physician has recommended that you wear a holter monitor 14 day ZIO . Holter monitors are medical devices that record the heart's electrical activity. Doctors most often use these monitors to diagnose arrhythmias. Arrhythmias are problems with the speed or rhythm of the heartbeat. The monitor is a small, portable device. You can wear one while you do your normal daily activities. This is usually used to diagnose what is causing palpitations/syncope (passing out).   Follow-Up: At [facility], you and your health needs are our priority.  As part of our continuing mission to provide you with exceptional heart care, we have created designated Provider Care Teams.  These Care Teams include your primary Cardiologist (physician) and Advanced Practice Providers (APPs -  Physician Assistants and Nurse Practitioners) who all work together to provide you with the care you need, when you need it.     Your next appointment:   3 to 4  month(s)  The format for your next appointment:   Virtual or in person   Provider:   Glenetta Hew, MD    Other Instructions  ZIO XT- Long Term Monitor Instructions  Your physician has requested you wear a ZIO patch monitor for 14 days.  This is a single patch monitor. [employer] supplies one patch monitor per enrollment. Additional stickers are not available. Please do not apply patch if you will be having a Nuclear Stress Test,  Echocardiogram, Cardiac CT, MRI, or Chest Xray during the period you would be wearing the  monitor. The patch cannot be worn during these tests. You cannot remove and re-apply the  ZIO XT patch monitor.  Your ZIO patch monitor will be mailed 3 day USPS to your address on file. It may take 3-5 days  to  receive your monitor after you have been enrolled.  Once you have received your monitor, please review the enclosed instructions. Your monitor  has already been registered assigning a specific monitor serial # to you.  Billing and Patient Assistance Program Information  We have supplied [employer] with any of your insurance information on file for billing purposes. [employer] offers a sliding scale Patient Assistance Program for patients that do not have  insurance, or whose insurance does not completely cover the cost of the ZIO monitor.  You must apply for the Patient Assistance Program to qualify for this discounted rate.  To apply, please call [employer] at [phone], select option 4, select option 2, ask to apply for  Patient Assistance Program. Theodore Demark will ask your household income, and how many people  are in your household. They will quote your out-of-pocket cost based on that information.  [employer] will also be able to set up a 16-month interest-free payment plan if needed.  Applying the monitor   Shave hair from upper left chest.  Hold abrader disc by orange tab. Rub abrader in 40 strokes over the upper left chest as  indicated in your monitor instructions.  Clean area with 4 enclosed alcohol pads. Let dry.  Apply patch as indicated in monitor instructions. Patch will be placed under collarbone on left  side of chest with arrow pointing upward.  Rub patch adhesive wings for 2 minutes. Remove white label marked "1". Remove the white  label marked "  2". Rub patch adhesive wings for 2 additional minutes.  While looking in a mirror, press and release button in center of patch. A small green light will  flash 3-4 times. This will be your only indicator that the monitor has been turned on.  Do not shower for the first 24 hours. You may shower after the first 24 hours.  Press the button if you feel a symptom. You will hear a small click. Record Date, Time and  Symptom in the Patient  Logbook.  When you are ready to remove the patch, follow instructions on the last 2 pages of Patient  Logbook. Stick patch monitor onto the last page of Patient Logbook.  Place Patient Logbook in the blue and white box. Use locking tab on box and tape box closed  securely. The blue and white box has prepaid postage on it. Please place it in the mailbox as  soon as possible. Your physician should have your test results approximately 7 days after the  monitor has been mailed back to [facility].  Call [place] at [phone] if you have questions regarding  your ZIO XT patch monitor. Call them immediately if you see an orange light blinking on your  monitor.  If your monitor falls off in less than 4 days, contact our Monitor department at [phone].  If your monitor becomes loose or falls off after 4 days call [employer] at [phone] for  suggestions on securing your monitor

## 2022-06-17 ENCOUNTER — Encounter: Payer: Self-pay | Admitting: Cardiology

## 2022-06-17 NOTE — Assessment & Plan Note (Signed)
For lack of better term I think fluttering sensation makes sense.  Is very unusual description of symptoms.  Does not seem to be palpitations, effective seems more like muscular fasciculations.  However it is not unreasonable to evaluate for arrhythmia.  I think this would be a more peace of mind.  Plan: 2-week Zio patch

## 2022-06-17 NOTE — Assessment & Plan Note (Signed)
Well-controlled BP on ARB.

## 2022-06-17 NOTE — Assessment & Plan Note (Signed)
No longer obese so she has lost weight.  She is on Ozempic and Jardiance along with metformin for diabetes.  Last A1c is 5.6.  Blood pressure is well-controlled.

## 2022-06-21 DIAGNOSIS — R002 Palpitations: Secondary | ICD-10-CM

## 2022-08-09 ENCOUNTER — Telehealth: Payer: Self-pay | Admitting: *Deleted

## 2022-08-09 NOTE — Telephone Encounter (Signed)
Called spoke to patient -  asked if she can move to another -timeslot. Patient was in agreement.  Schedule moved-  instruction were given how to do a mychart visit. Patient voiced understanding.

## 2022-08-14 DIAGNOSIS — L821 Other seborrheic keratosis: Secondary | ICD-10-CM | POA: Diagnosis not present

## 2022-08-14 DIAGNOSIS — D485 Neoplasm of uncertain behavior of skin: Secondary | ICD-10-CM | POA: Diagnosis not present

## 2022-08-14 DIAGNOSIS — D2261 Melanocytic nevi of right upper limb, including shoulder: Secondary | ICD-10-CM | POA: Diagnosis not present

## 2022-08-14 DIAGNOSIS — Z13820 Encounter for screening for osteoporosis: Secondary | ICD-10-CM | POA: Diagnosis not present

## 2022-08-14 DIAGNOSIS — Z6828 Body mass index (BMI) 28.0-28.9, adult: Secondary | ICD-10-CM | POA: Diagnosis not present

## 2022-08-14 DIAGNOSIS — Z01419 Encounter for gynecological examination (general) (routine) without abnormal findings: Secondary | ICD-10-CM | POA: Diagnosis not present

## 2022-08-14 DIAGNOSIS — L820 Inflamed seborrheic keratosis: Secondary | ICD-10-CM | POA: Diagnosis not present

## 2022-08-14 DIAGNOSIS — Z85828 Personal history of other malignant neoplasm of skin: Secondary | ICD-10-CM | POA: Diagnosis not present

## 2022-08-14 DIAGNOSIS — L570 Actinic keratosis: Secondary | ICD-10-CM | POA: Diagnosis not present

## 2022-08-14 DIAGNOSIS — Z1272 Encounter for screening for malignant neoplasm of vagina: Secondary | ICD-10-CM | POA: Diagnosis not present

## 2022-08-16 ENCOUNTER — Ambulatory Visit: Payer: BC Managed Care – PPO | Admitting: Cardiology

## 2022-08-16 ENCOUNTER — Ambulatory Visit: Payer: BC Managed Care – PPO | Attending: Cardiology | Admitting: Cardiology

## 2022-08-16 VITALS — BP 118/80 | Ht 60.5 in | Wt 147.0 lb

## 2022-08-16 DIAGNOSIS — R002 Palpitations: Secondary | ICD-10-CM

## 2022-08-16 DIAGNOSIS — R0789 Other chest pain: Secondary | ICD-10-CM

## 2022-08-16 DIAGNOSIS — I10 Essential (primary) hypertension: Secondary | ICD-10-CM | POA: Diagnosis not present

## 2022-08-16 DIAGNOSIS — F413 Other mixed anxiety disorders: Secondary | ICD-10-CM

## 2022-08-16 DIAGNOSIS — E8881 Metabolic syndrome: Secondary | ICD-10-CM | POA: Diagnosis not present

## 2022-08-16 NOTE — Progress Notes (Signed)
Virtual Visit via Video Note   Because of Amy Lang's co-morbid illnesses, she is at least at moderate risk for complications without adequate follow up.  This format is felt to be most appropriate for this patient at this time.  All issues noted in this document were discussed and addressed.  A limited physical exam was performed with this format.  Please refer to the patient's chart for her consent to telehealth for [facility].      Patient has given verbal permission to conduct this visit via virtual appointment and to bill insurance 08/19/2022 10:49 AM     Evaluation Performed:  Follow-up visit  Date:  08/19/2022   ID:  Amy Lang, DOB Nov 11, 1965, MRN [id number]  Patient Location: Home Provider Location: Office/Clinic  PCP:  Cassandria Anger, MD  Cardiologist:  Glenetta Hew, MD  Electrophysiologist:  None   Chief Complaint:   Chief Complaint  Patient presents with   Follow-up    Monitor results; feeling better.    ====================================  ASSESSMENT & PLAN:    Problem List Items Addressed This Visit       Cardiology Problems   Essential hypertension - Primary (Chronic)    BP stable on losartan.        Other   Metabolic syndrome (Chronic)    Notable weight loss.  A1c was down to 5.6.  Healthy on [place] and [place].      Fluttering sensation of heart    Nothing to suggest symptoms lasting enough for her to note.  She had 6 short atrial runs lasting less than 3 seconds.  Unlikely to be the cause of her symptoms.  Suspect these are mild nerve twitches exacerbated by anxiety.      Chest pain, atypical    I do not think that the symptoms she was having was true chest pain she was probably feeling mild nerve twitches.  Nothing noted on monitor.      Anxiety disorder    Monitored by Dr. Alain Marion.  On Prozac and Xanax along with Effexor.       ====================================  History of Present Illness:     Amy Lang is a 57 y.o. female with notable CRF's of HTN, HLD, DM-2, obesity (metabolic syndrome) with Coronary Calcium Score of 0 (2019) as strong Family History of Premature CAD who presents via audio/video conferencing for a telehealth visit today as a 41-month follow-up to discuss results of Zio patch monitor.Amy Lang was seen close follow-up back on June 14, 2022 at the request of Dr. Alain Marion.  I have not seen her since April 2021.  In this visit she described a strange crawling sensation in the left side of her chest off and on for several months.  She had actually seen [name], NP in our [place] office with a sensation of a gurgling like sensation in her chest since the water running across or under her skin.  There is no chest discomfort or pain.  Not associated with exertion. => We decided to evaluate this with a Zio patch monitor. -> Of note, she had been under quite a bit of stress due to issues with their house that were related to building error, but statute limitations was beyond it being covered by the builder.  Hospitalizations:  None  Recent - Interim CV studies:   The following studies were reviewed today: 14-day Zio patch January 2024   Predominant rhythm was sinus rhythm with a range  of 53-129 bpm; Avg 71 bpm   6 atrial runs noted: For past this was 5 beats at a max rate of 100 deforming, long this was 10 beats with rate of 145 bpm.   Otherwise rare PACs with some couplets and triplets.  Rare PVCs without couplets triplets, bigeminy or trigeminy.Amy Lang History   Amy Lang is being seen via virtual video conferencing to discuss results of her event monitor.  She says that her palpitations have definitely improved.  She did not necessarily notice any of the atrial runs-mostly noting sinus rhythm with occasional ectopy.  She really thinks that the episodes are related to anxiety as the symptoms seem to be fading away and no longer on  significant.  She still has some mild occasional symptoms but nothing really melena or except for now.  Otherwise been pretty stable from cardiac standpoint.  Cardiovascular ROS: no chest pain or dyspnea on exertion positive for - palpitations and notably improved.  Also less anxious. negative for - edema, orthopnea, paroxysmal nocturnal dyspnea, rapid heart rate, shortness of breath, or lightheadedness, dizziness or wooziness, syncope/near syncope or TIA/amaurosis fugax, claudication   ROS:  Please see the history of present illness.     Review of Systems  Constitutional:  Negative for malaise/fatigue and weight loss.  Psychiatric/Behavioral:  The patient is nervous/anxious (But notably improved.).     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[place])    Vitamin B 12 deficiency    Vitamin D deficiency    Past Surgical History:  Procedure Laterality Date   ABDOMINAL HYSTERECTOMY     still has bilateral ovaries   BREAST EXCISIONAL BIOPSY Right 08/27/2017   BREAST LUMPECTOMY WITH RADIOACTIVE SEED LOCALIZATION Right 08/28/2017   Procedure: BREAST LUMPECTOMY WITH RADIOACTIVE SEED LOCALIZATION;  Surgeon: Erroll Luna, MD;  Location: [facility];  Service: General;  Laterality: Right;   CHOLECYSTECTOMY  04-04-11   single site   COLONOSCOPY W/ BIOPSIES  01/24/2013   ESOPHAGOGASTRODUODENOSCOPY     KNEE SURGERY     LAPAROTOMY  05/12/2012   Procedure: LAPAROTOMY;  Surgeon: Cyril Mourning, MD;  Location: [facility];  Service: Gynecology;;  laparotomy [name] salpingectomy-oopharectomy   NM MYOCAR PERF WALL MOTION  09/06/2009   normal   TRANSTHORACIC ECHOCARDIOGRAM  March 2014   Normal regional wall motion. EF  55%. Mild MR     Current Meds  Medication Sig   albuterol (VENTOLIN HFA) 108 (90 Base) MCG/ACT inhaler TAKE 2 PUFFS BY MOUTH EVERY 6 HOURS AS NEEDED FOR WHEEZE OR SHORTNESS OF BREATH   ALPRAZolam (XANAX) 0.25 MG tablet Take 1 tablet (0.25 mg total) by mouth 2 (two) times daily as needed for anxiety (panic attacks).   amphetamine-dextroamphetamine (ADDERALL) 10 MG tablet Take 1 tablet (10 mg total) by mouth 2 (two) times daily. (Patient taking differently: Take 10 mg by mouth 2 (two) times daily. Takes 1/2 tab daily)   aspirin EC 81 MG tablet Take 81 mg by mouth every evening.   cetirizine (ZYRTEC) 10 MG tablet Take 10 mg by mouth daily.   Cholecalciferol (VITAMIN D3)  50 MCG (2000 UT) capsule Take 1 capsule (2,000 Units total) by mouth daily.   cyanocobalamin (VITAMIN B12) 1000 MCG/ML injection INJECT 1 ML IM OR SUBQ EVERY 2 WEEKS (LIFETIME)   empagliflozin (JARDIANCE) 10 MG TABS tablet TAKE 1 TABLET (10 MG TOTAL) BY MOUTH DAILY.   FLUoxetine (PROZAC) 40 MG capsule TAKE 1 CAPSULE BY MOUTH EVERY DAY   fluticasone (FLONASE) 50 MCG/ACT nasal spray SPRAY 2 SPRAYS INTO EACH NOSTRIL EVERY DAY   gabapentin (NEURONTIN) 100 MG capsule Take 1-2 capsules (100-200 mg total) by mouth at bedtime. (Patient taking differently: Take 100-200 mg by mouth daily as needed.)   losartan (COZAAR) 50 MG tablet TAKE 2 TABLETS (100MG ) BY MOUTH EVERY DAY   metFORMIN (GLUCOPHAGE-XR) 500 MG 24 hr tablet Take 1 tablet (500 mg total) by mouth every evening. Take with dinner.   Multiple Vitamins-Minerals (PRESERVISION AREDS 2) CAPS Take 1 capsule by mouth at bedtime.   Omega-3 Fatty Acids (FISH OIL) 1000 MG CAPS Take 1,000 mg by mouth daily.   ondansetron (ZOFRAN) 4 MG tablet Take 1 tablet (4 mg total) by mouth every 8 (eight) hours as needed for nausea or vomiting.   pantoprazole (PROTONIX) 40 MG tablet Take 1 tablet (40 mg total) by mouth daily.   Probiotic Product (PROBIOTIC ADVANCED PO) Take by mouth daily.   Semaglutide,  1 MG/DOSE, (OZEMPIC, 1 MG/DOSE,) 4 MG/3ML SOPN INJECT 1 MG ONCE A WEEK AS DIRECTED   topiramate (TOPAMAX) 200 MG tablet TAKE 1/2-1 TABLETS BY MOUTH DAILY   venlafaxine XR (EFFEXOR-XR) 37.5 MG 24 hr capsule TAKE 1 CAPSULE BY MOUTH DAILY WITH BREAKFAST.   vitamin C (ASCORBIC ACID) 500 MG tablet Take 500 mg by mouth daily.   vitamin E 1000 UNIT capsule Take 1,000 Units by mouth daily.   Zinc 50 MG CAPS Take by mouth.     Allergies:   Bee pollen, Butorphanol, Butorphanol tartrate, Codeine, Codeine sulfate, Crestor [rosuvastatin], Darvocet [propoxyphene n-acetaminophen], Estrogens, Levofloxacin, Metformin and related, Penicillamine, Penicillins, Sulfa antibiotics, and Latex   Social History   Tobacco Use   Smoking status: Never   Smokeless tobacco: Never   Tobacco comments:    Regular Exercise - No  Vaping Use   Vaping Use: Never used  Substance Use Topics   Alcohol use: No   Drug use: No     Family Hx: The patient's family history includes Breast cancer (age of onset: 62) in her maternal grandmother; COPD in her mother; Diabetes in her mother; Heart attack (age of onset: 82) in her mother; Heart disease in her maternal grandfather, maternal grandmother, mother, paternal grandfather, and paternal grandmother; Hyperlipidemia in her father and mother; Hypertension in her father and mother; Kidney disease in her mother; Lung cancer in her father; Thyroid disease in her mother. There is no history of Colon cancer, Esophageal cancer, Rectal cancer, or Stomach cancer.   Labs/Other Tests and Data Reviewed:    EKG:  No ECG reviewed.  Recent Labs: 09/05/2021: TSH 2.04 06/05/2022: ALT 18; BUN 20; Creatinine, Ser 0.70; Potassium 4.2; Sodium 140   Recent Lipid Panel Lab Results  Component Value Date/Time   CHOL 187 11/15/2020 10:20 AM   CHOL 173 09/08/2019 10:21 AM   TRIG 195.0 (H) 11/15/2020 10:20 AM   HDL 54.60 11/15/2020 10:20 AM   HDL 50 09/08/2019 10:21 AM   CHOLHDL 3 11/15/2020 10:20 AM    LDLCALC 93 11/15/2020 10:20 AM   LDLCALC 95 09/08/2019 10:21 AM   LDLDIRECT 96.0 10/01/2017 09:38 AM  Wt Readings from Last 3 Encounters:  08/16/22 147 lb (66.7 kg)  06/14/22 150 lb 12.8 oz (68.4 kg)  06/05/22 149 lb (67.6 kg)     Objective:    Vital Signs:  BP 118/80   Ht 5' 0.5" (1.537 m)   Wt 147 lb (66.7 kg)   BMI 28.24 kg/m   VITAL SIGNS:  reviewed GEN:  no acute distress RESPIRATORY:  normal respiratory effort, symmetric expansion NEURO:  alert and oriented x 3, no obvious focal deficit PSYCH:  normal affect   ==========================================  COVID-19 Education: The signs and symptoms of COVID-19 were discussed with the patient and how to seek care for testing (follow up with PCP or arrange E-visit).   The importance of social distancing was discussed today.  Time:   Today, I have spent 12 minutes with the patient with telehealth technology discussing the above problems.   An additional 10 minutes spent charting (reviewing prior notes, hospital records, studies, labs etc.) Total 34minutes   Medication Adjustments/Labs and Tests Ordered: Current medicines are reviewed at length with the patient today.  Concerns regarding medicines are outlined above.   There are no Patient Instructions on file for this visit.   Signed, Glenetta Hew, MD  08/19/2022 10:49 AM    [place]

## 2022-08-19 ENCOUNTER — Encounter: Payer: Self-pay | Admitting: Cardiology

## 2022-08-19 NOTE — Assessment & Plan Note (Signed)
Nothing to suggest symptoms lasting enough for her to note.  She had 6 short atrial runs lasting less than 3 seconds.  Unlikely to be the cause of her symptoms.  Suspect these are mild nerve twitches exacerbated by anxiety.

## 2022-08-19 NOTE — Assessment & Plan Note (Signed)
BP stable on losartan. 

## 2022-08-19 NOTE — Assessment & Plan Note (Signed)
Monitored by Dr. Alain Marion.  On Prozac and Xanax along with Effexor.

## 2022-08-19 NOTE — Assessment & Plan Note (Signed)
I do not think that the symptoms she was having was true chest pain she was probably feeling mild nerve twitches.  Nothing noted on monitor.

## 2022-08-19 NOTE — Assessment & Plan Note (Signed)
Notable weight loss.  A1c was down to 5.6.  Healthy on [place] and [place].

## 2022-09-10 ENCOUNTER — Other Ambulatory Visit: Payer: Self-pay | Admitting: Internal Medicine

## 2022-09-25 ENCOUNTER — Other Ambulatory Visit: Payer: Self-pay | Admitting: Internal Medicine

## 2022-09-27 ENCOUNTER — Other Ambulatory Visit: Payer: Self-pay | Admitting: Internal Medicine

## 2022-11-03 ENCOUNTER — Encounter: Payer: Self-pay | Admitting: Internal Medicine

## 2022-11-03 ENCOUNTER — Ambulatory Visit (INDEPENDENT_AMBULATORY_CARE_PROVIDER_SITE_OTHER): Payer: BC Managed Care – PPO | Admitting: Internal Medicine

## 2022-11-03 VITALS — BP 118/78 | HR 63 | Temp 98.3°F | Ht 60.5 in | Wt 148.2 lb

## 2022-11-03 DIAGNOSIS — E119 Type 2 diabetes mellitus without complications: Secondary | ICD-10-CM

## 2022-11-03 DIAGNOSIS — E538 Deficiency of other specified B group vitamins: Secondary | ICD-10-CM

## 2022-11-03 DIAGNOSIS — E559 Vitamin D deficiency, unspecified: Secondary | ICD-10-CM

## 2022-11-03 DIAGNOSIS — Z6828 Body mass index (BMI) 28.0-28.9, adult: Secondary | ICD-10-CM

## 2022-11-03 DIAGNOSIS — I10 Essential (primary) hypertension: Secondary | ICD-10-CM | POA: Diagnosis not present

## 2022-11-03 DIAGNOSIS — Z7985 Long-term (current) use of injectable non-insulin antidiabetic drugs: Secondary | ICD-10-CM

## 2022-11-03 DIAGNOSIS — G4700 Insomnia, unspecified: Secondary | ICD-10-CM

## 2022-11-03 DIAGNOSIS — Z1211 Encounter for screening for malignant neoplasm of colon: Secondary | ICD-10-CM

## 2022-11-03 DIAGNOSIS — E669 Obesity, unspecified: Secondary | ICD-10-CM

## 2022-11-03 LAB — COMPREHENSIVE METABOLIC PANEL
ALT: 17 U/L (ref 0–35)
AST: 18 U/L (ref 0–37)
Albumin: 4.4 g/dL (ref 3.5–5.2)
Alkaline Phosphatase: 70 U/L (ref 39–117)
BUN: 26 mg/dL — ABNORMAL HIGH (ref 6–23)
CO2: 27 mEq/L (ref 19–32)
Calcium: 9 mg/dL (ref 8.4–10.5)
Chloride: 107 mEq/L (ref 96–112)
Creatinine, Ser: 0.77 mg/dL (ref 0.40–1.20)
GFR: 84.66 mL/min (ref >60.00)
Glucose, Bld: 92 mg/dL (ref 70–99)
Potassium: 4.9 mEq/L (ref 3.5–5.1)
Sodium: 140 mEq/L (ref 135–145)
Total Bilirubin: 0.4 mg/dL (ref 0.2–1.2)
Total Protein: 7.1 g/dL (ref 6.0–8.3)

## 2022-11-03 LAB — TSH: TSH: 2.96 u[IU]/mL (ref 0.35–5.50)

## 2022-11-03 LAB — HEMOGLOBIN A1C: Hgb A1c MFr Bld: 5.3 % (ref 4.6–6.5)

## 2022-11-03 MED ORDER — "BD ECLIPSE SYRINGE 25G X 1"" 3 ML MISC": 3 refills | Status: AC

## 2022-11-03 MED ORDER — SEMAGLUTIDE (2 MG/DOSE) 8 MG/3ML ~~LOC~~ SOPN: 2.0000 mg | PEN_INJECTOR | SUBCUTANEOUS | 3 refills | Status: DC

## 2022-11-03 MED ORDER — SEMAGLUTIDE (2 MG/DOSE) 8 MG/3ML ~~LOC~~ SOPN: 2.0000 mg | PEN_INJECTOR | SUBCUTANEOUS | 5 refills | Status: DC

## 2022-11-03 NOTE — Assessment & Plan Note (Signed)
On Ozempic °

## 2022-11-03 NOTE — Assessment & Plan Note (Signed)
Losartan 

## 2022-11-03 NOTE — Assessment & Plan Note (Signed)
On Vit D 

## 2022-11-03 NOTE — Assessment & Plan Note (Addendum)
On B12 sq 

## 2022-11-03 NOTE — Progress Notes (Signed)
Subjective:  Patient ID: Amy Lang, female    DOB: 1966/03/21  Age: 57 y.o. MRN: [id number]  CC: No chief complaint on file.   HPI Amy Lang presents for DM, anxiety, CFS/FM sx's  Outpatient Medications Prior to Visit  Medication Sig Dispense Refill   albuterol (VENTOLIN HFA) 108 (90 Base) MCG/ACT inhaler TAKE 2 PUFFS BY MOUTH EVERY 6 HOURS AS NEEDED FOR WHEEZE OR SHORTNESS OF BREATH 54 each 3   ALPRAZolam (XANAX) 0.25 MG tablet Take 1 tablet (0.25 mg total) by mouth 2 (two) times daily as needed for anxiety (panic attacks). 60 tablet 1   amphetamine-dextroamphetamine (ADDERALL) 10 MG tablet Take 1 tablet (10 mg total) by mouth 2 (two) times daily. (Patient taking differently: Take 10 mg by mouth 2 (two) times daily. Takes 1/2 tab daily) 60 tablet 0   aspirin EC 81 MG tablet Take 81 mg by mouth every evening.     cetirizine (ZYRTEC) 10 MG tablet Take 10 mg by mouth daily.     Cholecalciferol (VITAMIN D3) 50 MCG (2000 UT) capsule Take 1 capsule (2,000 Units total) by mouth daily. 100 capsule 3   cyanocobalamin (VITAMIN B12) 1000 MCG/ML injection INJECT 1 ML IM OR SUBQ EVERY 2 WEEKS (LIFETIME) 6 mL 3   FLUoxetine (PROZAC) 40 MG capsule TAKE 1 CAPSULE BY MOUTH EVERY DAY 90 capsule 1   fluticasone (FLONASE) 50 MCG/ACT nasal spray SPRAY 2 SPRAYS INTO EACH NOSTRIL EVERY DAY 48 mL 3   gabapentin (NEURONTIN) 100 MG capsule Take 1-2 capsules (100-200 mg total) by mouth at bedtime. (Patient taking differently: Take 100-200 mg by mouth daily as needed.) 180 capsule 1   JARDIANCE 10 MG TABS tablet TAKE 1 TABLET BY MOUTH EVERY DAY 90 tablet 1   losartan (COZAAR) 50 MG tablet TAKE 2 TABLETS (100MG ) BY MOUTH EVERY DAY 180 tablet 1   metFORMIN (GLUCOPHAGE-XR) 500 MG 24 hr tablet Take 1 tablet (500 mg total) by mouth every evening. Take with dinner. 90 tablet 3   Multiple Vitamins-Minerals (PRESERVISION AREDS 2) CAPS Take 1 capsule by mouth at bedtime.     Omega-3 Fatty Acids (FISH OIL) 1000 MG  CAPS Take 1,000 mg by mouth daily.     ondansetron (ZOFRAN) 4 MG tablet Take 1 tablet (4 mg total) by mouth every 8 (eight) hours as needed for nausea or vomiting. 20 tablet 0   pantoprazole (PROTONIX) 40 MG tablet Take 1 tablet (40 mg total) by mouth daily. 90 tablet 3   Probiotic Product (PROBIOTIC ADVANCED PO) Take by mouth daily.     topiramate (TOPAMAX) 200 MG tablet TAKE 1/2-1 TABLETS BY MOUTH DAILY 60 tablet 2   venlafaxine XR (EFFEXOR-XR) 37.5 MG 24 hr capsule TAKE 1 CAPSULE BY MOUTH DAILY WITH BREAKFAST. 90 capsule 2   vitamin C (ASCORBIC ACID) 500 MG tablet Take 500 mg by mouth daily.     vitamin E 1000 UNIT capsule Take 1,000 Units by mouth daily.     Zinc 50 MG CAPS Take by mouth.     Semaglutide, 1 MG/DOSE, (OZEMPIC, 1 MG/DOSE,) 4 MG/3ML SOPN INJECT 1 MG ONCE A WEEK AS DIRECTED 9 mL 3   No facility-administered medications prior to visit.    ROS: Review of Systems  Constitutional:  Negative for activity change, appetite change, chills, fatigue and unexpected weight change.  HENT:  Negative for congestion, mouth sores and sinus pressure.   Eyes:  Negative for visual disturbance.  Respiratory:  Negative for cough and  chest tightness.   Gastrointestinal:  Negative for abdominal pain and nausea.  Genitourinary:  Negative for difficulty urinating, frequency and vaginal pain.  Musculoskeletal:  Positive for arthralgias and back pain. Negative for gait problem.  Skin:  Negative for pallor and rash.  Neurological:  Negative for dizziness, tremors, weakness, numbness and headaches.  Psychiatric/Behavioral:  Negative for confusion and sleep disturbance.     Objective:  BP 118/78 (BP Location: Left Arm, Patient Position: Sitting, Cuff Size: Normal)   Pulse 63   Temp 98.3 F (36.8 C) (Oral)   Ht 5' 0.5" (1.537 m)   Wt 148 lb 3.2 oz (67.2 kg)   SpO2 98%   BMI 28.47 kg/m   BP Readings from Last 3 Encounters:  11/03/22 118/78  08/16/22 118/80  06/14/22 128/72    Wt Readings  from Last 3 Encounters:  11/03/22 148 lb 3.2 oz (67.2 kg)  08/16/22 147 lb (66.7 kg)  06/14/22 150 lb 12.8 oz (68.4 kg)    Physical Exam Constitutional:      General: She is not in acute distress.    Appearance: She is well-developed.  HENT:     Head: Normocephalic.     Right Ear: External ear normal.     Left Ear: External ear normal.     Nose: Nose normal.  Eyes:     General:        Right eye: No discharge.        Left eye: No discharge.     Conjunctiva/sclera: Conjunctivae normal.     Pupils: Pupils are equal, round, and reactive to light.  Neck:     Thyroid: No thyromegaly.     Vascular: No JVD.     Trachea: No tracheal deviation.  Cardiovascular:     Rate and Rhythm: Normal rate and regular rhythm.     Heart sounds: Normal heart sounds.  Pulmonary:     Effort: No respiratory distress.     Breath sounds: No stridor. No wheezing.  Abdominal:     General: Bowel sounds are normal. There is no distension.     Palpations: Abdomen is soft. There is no mass.     Tenderness: There is no abdominal tenderness. There is no guarding or rebound.  Musculoskeletal:        General: No tenderness.     Cervical back: Normal range of motion and neck supple. No rigidity.  Lymphadenopathy:     Cervical: No cervical adenopathy.  Skin:    Findings: No erythema or rash.  Neurological:     Cranial Nerves: No cranial nerve deficit.     Motor: No abnormal muscle tone.     Coordination: Coordination normal.     Deep Tendon Reflexes: Reflexes normal.  Psychiatric:        Behavior: Behavior normal.        Thought Content: Thought content normal.        Judgment: Judgment normal.     Lab Results  Component Value Date   WBC 8.7 05/24/2021   HGB 14.6 05/24/2021   HCT 43.5 05/24/2021   PLT 193.0 05/24/2021   GLUCOSE 114 (H) 06/05/2022   CHOL 187 11/15/2020   TRIG 195.0 (H) 11/15/2020   HDL 54.60 11/15/2020   LDLDIRECT 96.0 10/01/2017   LDLCALC 93 11/15/2020   ALT 18 06/05/2022    AST 17 06/05/2022   NA 140 06/05/2022   K 4.2 06/05/2022   CL 110 06/05/2022   CREATININE 0.70 06/05/2022   BUN 20  06/05/2022   CO2 23 06/05/2022   TSH 2.04 09/05/2021   HGBA1C 5.6 06/05/2022   MICROALBUR 0.9 11/15/2020    MM 3D SCREEN BREAST BILATERAL  Result Date: 01/25/2022 CLINICAL DATA:  Screening. EXAM: DIGITAL SCREENING BILATERAL MAMMOGRAM WITH TOMOSYNTHESIS AND CAD TECHNIQUE: Bilateral screening digital craniocaudal and mediolateral oblique mammograms were obtained. Bilateral screening digital breast tomosynthesis was performed. The images were evaluated with computer-aided detection. COMPARISON:  Previous exam(s). ACR Breast Density Category c: The breast tissue is heterogeneously dense, which may obscure small masses. FINDINGS: There are no findings suspicious for malignancy. IMPRESSION: No mammographic evidence of malignancy. A result letter of this screening mammogram will be mailed directly to the patient. RECOMMENDATION: Screening mammogram in one year. (Code:SM-B-01Y) BI-RADS CATEGORY  1: Negative. Electronically Signed   By: Beckie Salts M.D.   On: 01/25/2022 12:54    Assessment & Plan:   Problem List Items Addressed This Visit     B12 deficiency (Chronic)    On B12 sq      Obesity (BMI 30-39.9) (Chronic)    On Ozempic      Relevant Medications   Semaglutide, 2 MG/DOSE, 8 MG/3ML SOPN   Essential hypertension - Primary (Chronic)    Losartan      Relevant Orders   Comprehensive metabolic panel   Hemoglobin A1c   TSH   Type 2 diabetes mellitus without complication, without long-term current use of insulin (HCC)   Relevant Medications   Semaglutide, 2 MG/DOSE, 8 MG/3ML SOPN   Other Relevant Orders   Hemoglobin A1c   Vitamin D deficiency    On Vit D      Insomnia disorder    Melatonin 5 mg Valerian root 1-2 at night      Other Visit Diagnoses     Screening for colon cancer       Relevant Orders   Ambulatory referral to Gastroenterology          Meds ordered this encounter  Medications   DISCONTD: Semaglutide, 2 MG/DOSE, 8 MG/3ML SOPN    Sig: Inject 2 mg as directed once a week.    Dispense:  3 mL    Refill:  5   SYRINGE-NEEDLE, DISP, 3 ML (BD ECLIPSE SYRINGE) 25G X 1" 3 ML MISC    Sig: Use sq for B12 inj    Dispense:  50 each    Refill:  3   Semaglutide, 2 MG/DOSE, 8 MG/3ML SOPN    Sig: Inject 2 mg as directed once a week.    Dispense:  9 mL    Refill:  3      Follow-up: Return in about 4 months (around 03/05/2023) for a follow-up visit.  Sonda Primes, MD

## 2022-11-03 NOTE — Assessment & Plan Note (Signed)
Melatonin 5 mg Valerian root 1-2 at night 

## 2022-11-13 ENCOUNTER — Telehealth: Payer: Self-pay | Admitting: Internal Medicine

## 2022-11-13 NOTE — Telephone Encounter (Signed)
Notified pt rx was sent to [facility] on 11/03/22. She states she haven't heard anything from them. Gave pt [facility] # to call check status.../l;mb    Semaglutide, 2 MG/DOSE, 8 MG/3ML SOPN 2 mg, Weekly 3 ordered       Summary: Inject 2 mg as directed once a week., Starting Fri 11/03/2022, Normal Dose, Route, Frequency: 2 mg, Injection, WeeklyStart: 06/07/2024Ord/Sold: 11/03/2022 (O)Ordered On: 06/07/2024Pharmacy: [facility] - [place], [place] - [address] [facility] AT [address]

## 2022-11-13 NOTE — Telephone Encounter (Signed)
Patients ozempic was not sent to the mail order pharmacy - [facility] [address] - Should she pick up this one and you send the other to the mail order for 3 months.  Please call patient and advise.  [phone]

## 2022-11-16 ENCOUNTER — Telehealth: Payer: Self-pay | Admitting: Internal Medicine

## 2022-11-16 NOTE — Telephone Encounter (Signed)
Patient called and said she would like a call back to answer questions about Ozempic. Best callback is [phone].

## 2022-11-16 NOTE — Telephone Encounter (Signed)
Called pt she states saw MD 2 weeks ago and was given 0.2 mg on the [facility]. Have not gotten rx filled bcz she doesn't want to loose a lot weight. Want to know can she stay on the 0.1 mg. If so can [facility].Marland KitchenRaechel Chute

## 2022-11-17 NOTE — Telephone Encounter (Signed)
Called pt and informed her of MD response, pt verbalized understanding and would like the 1mg  dosage of Ozempic [facility] to [facility] 2mg .

## 2022-11-17 NOTE — Telephone Encounter (Signed)
Yes, she can stay on semaglutide 1 mg subcu weekly.  Thanks

## 2022-11-19 MED ORDER — SEMAGLUTIDE (1 MG/DOSE) 4 MG/3ML ~~LOC~~ SOPN: 1.0000 mg | PEN_INJECTOR | SUBCUTANEOUS | 1 refills | Status: DC

## 2022-11-19 NOTE — Telephone Encounter (Signed)
Okay.  Thanks.

## 2022-11-19 NOTE — Addendum Note (Signed)
Addended by: Tresa Garter on: 11/19/2022 11:39 AM   Modules accepted: Orders

## 2022-11-20 MED ORDER — SEMAGLUTIDE (1 MG/DOSE) 4 MG/3ML ~~LOC~~ SOPN: 1.0000 mg | PEN_INJECTOR | SUBCUTANEOUS | 1 refills | Status: DC

## 2022-11-20 NOTE — Addendum Note (Signed)
Addended by: Marinus Maw on: 11/20/2022 08:03 AM   Modules accepted: Orders

## 2022-12-06 ENCOUNTER — Other Ambulatory Visit: Payer: Self-pay | Admitting: Internal Medicine

## 2022-12-14 ENCOUNTER — Other Ambulatory Visit: Payer: Self-pay | Admitting: Internal Medicine

## 2023-01-24 ENCOUNTER — Other Ambulatory Visit: Payer: Self-pay | Admitting: Internal Medicine

## 2023-01-24 DIAGNOSIS — Z0000 Encounter for general adult medical examination without abnormal findings: Secondary | ICD-10-CM

## 2023-02-14 ENCOUNTER — Other Ambulatory Visit: Payer: Self-pay | Admitting: Internal Medicine

## 2023-02-19 ENCOUNTER — Ambulatory Visit
Admission: RE | Admit: 2023-02-19 | Discharge: 2023-02-19 | Disposition: A | Discharge status: Home / Self Care | Payer: BC Managed Care – PPO | Source: Ambulatory Visit | Attending: Internal Medicine | Admitting: Internal Medicine

## 2023-02-19 DIAGNOSIS — Z0000 Encounter for general adult medical examination without abnormal findings: Secondary | ICD-10-CM

## 2023-02-19 DIAGNOSIS — Z1231 Encounter for screening mammogram for malignant neoplasm of breast: Secondary | ICD-10-CM | POA: Diagnosis not present

## 2023-02-22 ENCOUNTER — Other Ambulatory Visit: Payer: Self-pay | Admitting: Internal Medicine

## 2023-02-22 DIAGNOSIS — R928 Other abnormal and inconclusive findings on diagnostic imaging of breast: Secondary | ICD-10-CM

## 2023-02-27 ENCOUNTER — Ambulatory Visit
Admission: RE | Admit: 2023-02-27 | Discharge: 2023-02-27 | Disposition: A | Discharge status: Home / Self Care | Payer: BC Managed Care – PPO | Source: Ambulatory Visit | Attending: Internal Medicine | Admitting: Internal Medicine

## 2023-02-27 DIAGNOSIS — R928 Other abnormal and inconclusive findings on diagnostic imaging of breast: Secondary | ICD-10-CM

## 2023-02-27 DIAGNOSIS — R921 Mammographic calcification found on diagnostic imaging of breast: Secondary | ICD-10-CM | POA: Diagnosis not present

## 2023-02-28 ENCOUNTER — Other Ambulatory Visit: Payer: Self-pay | Admitting: Internal Medicine

## 2023-02-28 DIAGNOSIS — R921 Mammographic calcification found on diagnostic imaging of breast: Secondary | ICD-10-CM

## 2023-03-02 ENCOUNTER — Ambulatory Visit
Admission: RE | Admit: 2023-03-02 | Discharge: 2023-03-02 | Disposition: A | Discharge status: Home / Self Care | Payer: BC Managed Care – PPO | Source: Ambulatory Visit | Attending: Internal Medicine | Admitting: Internal Medicine

## 2023-03-02 ENCOUNTER — Other Ambulatory Visit: Payer: Self-pay | Admitting: Internal Medicine

## 2023-03-02 ENCOUNTER — Other Ambulatory Visit (HOSPITAL_COMMUNITY): Payer: Self-pay | Admitting: Diagnostic Radiology

## 2023-03-02 DIAGNOSIS — R921 Mammographic calcification found on diagnostic imaging of breast: Secondary | ICD-10-CM

## 2023-03-02 DIAGNOSIS — Z1211 Encounter for screening for malignant neoplasm of colon: Secondary | ICD-10-CM

## 2023-03-02 DIAGNOSIS — D0511 Intraductal carcinoma in situ of right breast: Secondary | ICD-10-CM | POA: Diagnosis not present

## 2023-03-02 DIAGNOSIS — Z1212 Encounter for screening for malignant neoplasm of rectum: Secondary | ICD-10-CM

## 2023-03-02 DIAGNOSIS — N6311 Unspecified lump in the right breast, upper outer quadrant: Secondary | ICD-10-CM | POA: Diagnosis not present

## 2023-03-02 HISTORY — PX: BREAST BIOPSY: SHX20

## 2023-03-05 LAB — SURGICAL PATHOLOGY

## 2023-03-06 ENCOUNTER — Telehealth: Payer: Self-pay | Admitting: Internal Medicine

## 2023-03-06 NOTE — Telephone Encounter (Signed)
Prescription Request  03/06/2023  LOV: 11/03/2022  Pt called asking if Dr. Macario Golds can up her Baron Hamper and also for him to prescribe her some Zofran due to pt just got diagnose with breast cancer pt stated her nerves is out the roof and she keep getting sick on the stomach.  What is the name of the medication or equipment? ALPRAZolam (XANAX) 0.25 MG tablet  ondansetron (ZOFRAN) 4 MG tablet   Have you contacted your pharmacy to request a refill? No   Which pharmacy would you like this sent to?  [id number] [place], [place] - [address] AT [facility] [address] [place] [address] Phone: [phone] Fax: [phone]    Patient notified that their request is being sent to the clinical staff for review and that they should receive a response within 2 business days.   Please advise at Mobile [phone] (mobile)

## 2023-03-08 MED ORDER — ALPRAZOLAM 0.5 MG PO TABS: 0.5000 mg | ORAL_TABLET | Freq: Two times a day (BID) | ORAL | 3 refills | Status: DC | PRN

## 2023-03-08 MED ORDER — ONDANSETRON HCL 4 MG PO TABS: 4.0000 mg | ORAL_TABLET | Freq: Three times a day (TID) | ORAL | 1 refills | Status: AC | PRN

## 2023-03-08 NOTE — Telephone Encounter (Signed)
Okay.  It is done. I am sorry.  I do not see the biopsy report yet.  Thank you

## 2023-03-15 ENCOUNTER — Other Ambulatory Visit: Payer: Self-pay | Admitting: General Surgery

## 2023-03-15 ENCOUNTER — Telehealth: Payer: Self-pay | Admitting: Radiation Oncology

## 2023-03-15 DIAGNOSIS — D0511 Intraductal carcinoma in situ of right breast: Secondary | ICD-10-CM

## 2023-03-15 NOTE — Progress Notes (Addendum)
Location of Breast Cancer:right    Diagnoses  Ductal carcinoma in situ (DCIS) of right breast     Histology per Pathology Report:    Receptor Status: ER(40), PR (0 negative),   Did patient present with symptoms (if so, please note symptoms) or was this found on screening mammography?: screening mamogram  Past/Anticipated interventions by surgeon, if any: Dr. Dwain Sarna     Past/Anticipated interventions by medical oncology, if any: Chemotherapy   Lymphedema issues, if any: She denies lymphedema  Pain issues, if any:  no   SAFETY ISSUES: Prior radiation? no Pacemaker/ICD? no Possible current pregnancy?no Is the patient on methotrexate? no  Current Complaints / other details:  She states that she does not have any questions or concerns today.    BP (!) 129/57 (BP Location: Left Arm, Patient Position: Sitting, Cuff Size: Normal)   Pulse 64   Temp (!) 96.8 F (36 C)   Resp 18   Ht 5' (1.524 m)   Wt 149 lb 9.6 oz (67.9 kg)   SpO2 100%   BMI 29.22 kg/m

## 2023-03-15 NOTE — Telephone Encounter (Signed)
Called patient to schedule a consultation w. Dr. Roselind Messier. No answer, LVM for a return call.

## 2023-03-16 DIAGNOSIS — Z1211 Encounter for screening for malignant neoplasm of colon: Secondary | ICD-10-CM | POA: Diagnosis not present

## 2023-03-16 DIAGNOSIS — Z1212 Encounter for screening for malignant neoplasm of rectum: Secondary | ICD-10-CM | POA: Diagnosis not present

## 2023-03-17 NOTE — Progress Notes (Signed)
Radiation Oncology         [phone] ________________________________  Initial Outpatient Consultation  Name: Amy Lang MRN: [id number]  Date: 03/19/2023  DOB: Dec 01, 1965  WU:[id number], Georgina Quint, MD  Emelia Loron, MD   REFERRING PHYSICIAN: Emelia Loron, MD  DIAGNOSIS: {There were no encounter diagnoses. 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 female who is accompanied by ***. she is seen as a courtesy of Dr. Dwain Sarna for an opinion concerning radiation therapy as part of management for her recently diagnosed right breast DCIS.   She presented for a routine screening mammogram on 02/19/23 which showed possible calcifications in the right breast. Right breast diagnostic mammogram on 02/27/23 further revealed indeterminate calcifications spanning 1.5 cm in the central right breast.  Biopsy of the right breast calcifications on 03/02/23 showed high grade DCIS measuring 3 mm in the greatest linear extent of the sample with necrosis. Prognostic indicators significant for: estrogen receptor 40% positive with moderate staining intensity; progesterone receptor 0% negative; Her2 not assessed.   She was accordingly seen in consultation by Dr. Dwain Sarna on 03/15/23 and has opted to proceed with breast conserving surgery. Her procedure is currently scheduled for 03/28/23.    PREVIOUS RADIATION THERAPY: No  PAST MEDICAL HISTORY:  Past Medical History:  Diagnosis Date   Allergy    Anxiety    Asthma    Depression    Diabetes (HCC)    Dyslipidemia    Hypertension    IBS (irritable bowel syndrome)    Lactose intolerance    Migraines    Obesity    PONV (postoperative nausea and vomiting)    Shortness of breath    Vaginal cancer (HCC)    Vitamin B 12 deficiency    Vitamin D deficiency     PAST SURGICAL  HISTORY: Past Surgical History:  Procedure Laterality Date   ABDOMINAL HYSTERECTOMY     still has bilateral ovaries   BREAST BIOPSY Right 03/02/2023   MM RT BREAST BX W LOC DEV 1ST LESION IMAGE BX SPEC STEREO GUIDE 03/02/2023 [facility]   BREAST EXCISIONAL BIOPSY Right 08/27/2017   BREAST LUMPECTOMY WITH RADIOACTIVE SEED LOCALIZATION Right 08/28/2017   Procedure: BREAST LUMPECTOMY WITH RADIOACTIVE SEED LOCALIZATION;  Surgeon: Harriette Bouillon, MD;  Location: [facility];  Service: General;  Laterality: Right;   CHOLECYSTECTOMY  04-04-11   single site   COLONOSCOPY W/ BIOPSIES  01/24/2013   ESOPHAGOGASTRODUODENOSCOPY     KNEE SURGERY     LAPAROTOMY  05/12/2012   Procedure: LAPAROTOMY;  Surgeon: Jeani Hawking, MD;  Location: [facility];  Service: Gynecology;;  laparotomy Maura Crandall salpingectomy-oopharectomy   NM MYOCAR PERF WALL MOTION  09/06/2009   normal   TRANSTHORACIC ECHOCARDIOGRAM  March 2014   Normal regional wall motion. EF 55%. Mild MR    FAMILY HISTORY:  Family History  Problem Relation Age of Onset   Heart disease Mother    COPD Mother    Heart attack Mother 79   Diabetes Mother    Hypertension Mother    Hyperlipidemia Mother    Kidney disease Mother    Thyroid disease Mother    Lung cancer Father    Hypertension Father    Hyperlipidemia Father    Heart disease Maternal Grandmother    Breast cancer Maternal Grandmother 60   Heart disease Maternal  Grandfather    Heart disease Paternal Grandmother    Heart disease Paternal Grandfather    Colon cancer Neg Hx    Esophageal cancer Neg Hx    Rectal cancer Neg Hx    Stomach cancer Neg Hx     SOCIAL HISTORY:  Social History   Tobacco Use   Smoking status: Never   Smokeless tobacco: Never   Tobacco comments:    Regular Exercise - No  Vaping Use   Vaping status: Never Used  Substance Use Topics   Alcohol use: No   Drug use: No    ALLERGIES:  Allergies  Allergen Reactions   Bee Pollen  Anaphylaxis   Butorphanol    Butorphanol Tartrate Other (See Comments)    REACTION: hallucinations ( stadol)   Codeine    Codeine Sulfate Hives and Nausea And Vomiting   Crestor [Rosuvastatin] Other (See Comments)    Body aches   Darvocet [Propoxyphene N-Acetaminophen] Hives and Nausea And Vomiting   Estrogens    Levofloxacin Other (See Comments)    abdominal pain   Metformin And Related     diarrhea   Penicillamine    Penicillins Other (See Comments)    Seizure    Sulfa Antibiotics Hives, Nausea And Vomiting and Other (See Comments)   Latex Rash    Blisters and redness with bandaids-also sensitivity to latex gloves    MEDICATIONS:  Current Outpatient Medications  Medication Sig Dispense Refill   albuterol (VENTOLIN HFA) 108 (90 Base) MCG/ACT inhaler TAKE 2 PUFFS BY MOUTH EVERY 6 HOURS AS NEEDED FOR WHEEZE OR SHORTNESS OF BREATH 54 each 3   ALPRAZolam (XANAX) 0.5 MG tablet Take 1 tablet (0.5 mg total) by mouth 2 (two) times daily as needed for anxiety. 60 tablet 3   amphetamine-dextroamphetamine (ADDERALL) 10 MG tablet Take 1 tablet (10 mg total) by mouth 2 (two) times daily. (Patient taking differently: Take 10 mg by mouth 2 (two) times daily. Takes 1/2 tab daily) 60 tablet 0   aspirin EC 81 MG tablet Take 81 mg by mouth every evening.     cetirizine (ZYRTEC) 10 MG tablet Take 10 mg by mouth daily.     Cholecalciferol (VITAMIN D3) 50 MCG (2000 UT) capsule Take 1 capsule (2,000 Units total) by mouth daily. 100 capsule 3   cyanocobalamin (VITAMIN B12) 1000 MCG/ML injection INJECT 1 ML INTO MUSCLE OR SUBCUTANEOUSLY EVERY 2 WEEKS (LIFETIME) 6 mL 3   FLUoxetine (PROZAC) 40 MG capsule TAKE 1 CAPSULE BY MOUTH EVERY DAY 90 capsule 1   fluticasone (FLONASE) 50 MCG/ACT nasal spray SPRAY 2 SPRAYS INTO EACH NOSTRIL EVERY DAY 48 mL 3   gabapentin (NEURONTIN) 100 MG capsule Take 1-2 capsules (100-200 mg total) by mouth at bedtime. (Patient taking differently: Take 100-200 mg by mouth daily as  needed.) 180 capsule 1   JARDIANCE 10 MG TABS tablet TAKE 1 TABLET BY MOUTH EVERY DAY 90 tablet 1   losartan (COZAAR) 50 MG tablet TAKE 2 TABLETS (100MG ) BY MOUTH EVERY DAY 180 tablet 1   metFORMIN (GLUCOPHAGE-XR) 500 MG 24 hr tablet TAKE 1 TABLET (500 MG TOTAL) BY MOUTH EVERY EVENING. TAKE WITH DINNER. 90 tablet 3   Multiple Vitamins-Minerals (PRESERVISION AREDS 2) CAPS Take 1 capsule by mouth at bedtime.     Omega-3 Fatty Acids (FISH OIL) 1000 MG CAPS Take 1,000 mg by mouth daily.     ondansetron (ZOFRAN) 4 MG tablet Take 1 tablet (4 mg total) by mouth every 8 (eight) hours as  needed for nausea or vomiting. 20 tablet 1   pantoprazole (PROTONIX) 40 MG tablet Take 1 tablet (40 mg total) by mouth daily. 90 tablet 3   Probiotic Product (PROBIOTIC ADVANCED PO) Take by mouth daily.     Semaglutide, 1 MG/DOSE, 4 MG/3ML SOPN Inject 1 mg as directed once a week. 9 mL 1   SYRINGE-NEEDLE, DISP, 3 ML (BD ECLIPSE SYRINGE) 25G X 1" 3 ML MISC Use sq for B12 inj 50 each 3   topiramate (TOPAMAX) 200 MG tablet TAKE 1/2-1 TABLETS BY MOUTH DAILY 60 tablet 2   venlafaxine XR (EFFEXOR-XR) 37.5 MG 24 hr capsule TAKE 1 CAPSULE BY MOUTH DAILY WITH BREAKFAST. 90 capsule 2   vitamin C (ASCORBIC ACID) 500 MG tablet Take 500 mg by mouth daily.     vitamin E 1000 UNIT capsule Take 1,000 Units by mouth daily.     Zinc 50 MG CAPS Take by mouth.     No current facility-administered medications for this encounter.    REVIEW OF SYSTEMS:  A 10+ POINT REVIEW OF SYSTEMS WAS OBTAINED including neurology, dermatology, psychiatry, cardiac, respiratory, lymph, extremities, GI, GU, musculoskeletal, constitutional, reproductive, HEENT. ***   PHYSICAL EXAM:  vitals were not taken for this visit.   General: Alert and oriented, in no acute distress HEENT: Head is normocephalic. Extraocular movements are intact. Oropharynx is clear. Neck: Neck is supple, no palpable cervical or supraclavicular lymphadenopathy. Heart: Regular in rate  and rhythm with no murmurs, rubs, or gallops. Chest: Clear to auscultation bilaterally, with no rhonchi, wheezes, or rales. Abdomen: Soft, nontender, nondistended, with no rigidity or guarding. Extremities: No cyanosis or edema. Lymphatics: see Neck Exam Skin: No concerning lesions. Musculoskeletal: symmetric strength and muscle tone throughout. Neurologic: Cranial nerves II through XII are grossly intact. No obvious focalities. Speech is fluent. Coordination is intact. Psychiatric: Judgment and insight are intact. Affect is appropriate.  Left Breast: no palpable mass, nipple discharge or bleeding. Right Breast: ***  ECOG = ***  0 - Asymptomatic (Fully active, able to carry on all predisease activities without restriction)  1 - Symptomatic but completely ambulatory (Restricted in physically strenuous activity but ambulatory and able to carry out work of a light or sedentary nature. For example, light housework, office work)  2 - Symptomatic, <50% in bed during the day (Ambulatory and capable of all self care but unable to carry out any work activities. Up and about more than 50% of waking hours)  3 - Symptomatic, >50% in bed, but not bedbound (Capable of only limited self-care, confined to bed or chair 50% or more of waking hours)  4 - Bedbound (Completely disabled. Cannot carry on any self-care. Totally confined to bed or chair)  5 - Death   Santiago Glad MM, Creech RH, Tormey DC, et al. [phone]). "Toxicity and response criteria of the [facility]". Am. Evlyn Clines. Oncol. 5 (6): 649-55  LABORATORY DATA:  Lab Results  Component Value Date   WBC 8.7 05/24/2021   HGB 14.6 05/24/2021   HCT 43.5 05/24/2021   MCV 89.8 05/24/2021   PLT 193.0 05/24/2021   NEUTROABS 5.8 05/24/2021   Lab Results  Component Value Date   NA 140 11/03/2022   K 4.9 11/03/2022   CL 107 11/03/2022   CO2 27 11/03/2022   GLUCOSE 92 11/03/2022   BUN 26 (H) 11/03/2022   CREATININE 0.77 11/03/2022    CALCIUM 9.0 11/03/2022      RADIOGRAPHY: MM RT BREAST BX W LOC DEV  1ST LESION IMAGE BX SPEC STEREO GUIDE  Addendum Date: 03/05/2023   ADDENDUM REPORT: 03/05/2023 12:49 ADDENDUM: Pathology revealed DUCTAL CARCINOMA IN SITU, HIGH GRADE, CRIBRIFORM TYPE, NECROSIS: PRESENT, CALCIFICATIONS: PRESENT, BACKGROUND BREAST TISSUE WITH STROMAL FIBROSIS of the RIGHT breast, upper-outer quadrant, (venus clip) . This was found to be concordant by Dr. Baird Lyons. Pathology results were discussed with the patient by telephone. The patient reported doing well after the biopsy with tenderness and bruising at the site. Post biopsy instructions and care were reviewed and questions were answered. The patient was encouraged to call [facility] for any additional concerns. My direct phone number was provided. Surgical consultation has been arranged with Dr. Emelia Loron, per patient request, at [facility] on March 15, 2023. Consideration for a bilateral breast MRI for further evaluation of extent of disease given the high grade histology. Pathology results reported by Rene Kocher, RN on 03/05/2023. Electronically Signed   By: Baird Lyons M.D.   On: 03/05/2023 12:49   Result Date: 03/05/2023 CLINICAL DATA:  Right breast calcifications. EXAM: RIGHT BREAST STEREOTACTIC CORE NEEDLE BIOPSY COMPARISON:  Previous exam(s). FINDINGS: The patient and I discussed the procedure of stereotactic-guided biopsy including benefits and alternatives. We discussed the high likelihood of a successful procedure. We discussed the risks of the procedure including infection, bleeding, tissue injury, clip migration, and inadequate sampling. Informed written consent was given. The usual time out protocol was performed immediately prior to the procedure. Using sterile technique and 1% lidocaine and 1% lidocaine with epinephrine as local anesthetic, under stereotactic guidance, a 9 gauge vacuum assisted  device was used to perform core needle biopsy of calcifications in the upper-outer quadrant of the right breast using a superior to inferior approach. Specimen radiograph was performed showing calcifications are present in the tissue samples. Specimens with calcifications are identified for pathology. Lesion quadrant: Upper-outer quadrant At the conclusion of the procedure, venus shaped tissue marker clip was deployed into the biopsy cavity. Follow-up 2-view mammogram was performed and dictated separately. IMPRESSION: Stereotactic-guided biopsy of the right.  No apparent complications. Electronically Signed: By: Baird Lyons M.D. On: 03/02/2023 08:07   MM CLIP PLACEMENT RIGHT  Result Date: 03/02/2023 CLINICAL DATA:  Status post stereotactic biopsy of right breast calcifications. EXAM: 3D DIAGNOSTIC RIGHT MAMMOGRAM POST STEREOTACTIC BIOPSY COMPARISON:  Previous exam(s). FINDINGS: 3D Mammographic images were obtained following stereotactic guided biopsy of the right breast. The biopsy marking clip is in expected location in the upper-outer quadrant of the right breast. IMPRESSION: Appropriate positioning of the venous shaped biopsy marking clip at the site of biopsy in the upper-outer quadrant of the right breast. Final Assessment: Post Procedure Mammograms for Marker Placement BI-RADS CATEGORY  71M: Post-Procedure Mammogram for Marker Placement Electronically Signed   By: Baird Lyons M.D.   On: 03/02/2023 08:16   MM Digital Diagnostic Unilat R  Result Date: 02/27/2023 CLINICAL DATA:  57 year old female presenting as a recall from screening for possible right breast calcifications. EXAM: DIGITAL DIAGNOSTIC UNILATERAL RIGHT MAMMOGRAM TECHNIQUE: Right digital diagnostic mammography was performed. COMPARISON:  Previous exam(s). ACR Breast Density Category c: The breasts are heterogeneously dense, which may obscure small masses. FINDINGS: Spot 2D magnification views and full true lateral 2D views of the right breast  were performed demonstrating persistence of a group of faint pleomorphic calcifications in a linear distribution in the central right breast spanning 1.5 cm. No associated mass or distortion. IMPRESSION: Indeterminate calcifications spanning 1.5 cm in the central right  breast. RECOMMENDATION: Stereotactic core needle biopsy of the right breast x1. I have discussed the findings and recommendations with the patient. If applicable, a reminder letter will be sent to the patient regarding the next appointment. BI-RADS CATEGORY  4: Suspicious. Electronically Signed   By: Emmaline Kluver M.D.   On: 02/27/2023 16:08   MM 3D SCREENING MAMMOGRAM BILATERAL BREAST  Result Date: 02/21/2023 CLINICAL DATA:  Screening. EXAM: DIGITAL SCREENING BILATERAL MAMMOGRAM WITH TOMOSYNTHESIS AND CAD TECHNIQUE: Bilateral screening digital craniocaudal and mediolateral oblique mammograms were obtained. Bilateral screening digital breast tomosynthesis was performed. The images were evaluated with computer-aided detection. COMPARISON:  Previous exam(s). ACR Breast Density Category c: The breasts are heterogeneously dense, which may obscure small masses. FINDINGS: In the right breast, calcifications warrant further evaluation with magnified views. In the left breast, no findings suspicious for malignancy. IMPRESSION: Further evaluation is suggested for calcifications in the right breast. RECOMMENDATION: Diagnostic mammogram of the right breast. (Code:[id number]-63M) The patient will be contacted regarding the findings, and additional imaging will be scheduled. BI-RADS CATEGORY  0: Incomplete: Need additional imaging evaluation. Electronically Signed   By: Emmaline Kluver M.D.   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  We discussed the natural history of  *** and general treatment, highlighting the role of radiotherapy in the management.  We discussed the available radiation techniques, and focused on the details of logistics and delivery.  We reviewed the anticipated acute and late sequelae associated with radiation in this setting.  The patient was encouraged to ask questions that I answered to the best of my ability. *** A patient consent form was discussed and signed.  We retained a copy for our records.  The patient would like to proceed with radiation and will be scheduled for CT simulation.  PLAN: ***    *** minutes of total time was spent for this patient encounter, including preparation, face-to-face counseling with the patient and coordination of care, physical exam, and documentation of the encounter.   ------------------------------------------------  Billie Lade, PhD, MD  This document serves as a record of services personally performed by Antony Blackbird, MD. It was created on his behalf by Neena Rhymes, a trained medical scribe. The creation of this record is based on the scribe's personal observations and the provider's statements to them. This document has been checked and approved by the attending provider.

## 2023-03-19 ENCOUNTER — Ambulatory Visit
Admission: RE | Admit: 2023-03-19 | Discharge: 2023-03-19 | Disposition: A | Discharge status: Home / Self Care | Payer: BC Managed Care – PPO | Source: Ambulatory Visit | Attending: Radiation Oncology | Admitting: Radiation Oncology

## 2023-03-19 VITALS — BP 129/57 | HR 64 | Temp 96.8°F | Resp 18 | Ht 60.0 in | Wt 149.6 lb

## 2023-03-19 DIAGNOSIS — D0511 Intraductal carcinoma in situ of right breast: Secondary | ICD-10-CM | POA: Diagnosis not present

## 2023-03-19 DIAGNOSIS — I10 Essential (primary) hypertension: Secondary | ICD-10-CM | POA: Insufficient documentation

## 2023-03-19 DIAGNOSIS — E538 Deficiency of other specified B group vitamins: Secondary | ICD-10-CM | POA: Insufficient documentation

## 2023-03-19 DIAGNOSIS — Z7985 Long-term (current) use of injectable non-insulin antidiabetic drugs: Secondary | ICD-10-CM | POA: Diagnosis not present

## 2023-03-19 DIAGNOSIS — Z801 Family history of malignant neoplasm of trachea, bronchus and lung: Secondary | ICD-10-CM | POA: Diagnosis not present

## 2023-03-19 DIAGNOSIS — Z7982 Long term (current) use of aspirin: Secondary | ICD-10-CM | POA: Insufficient documentation

## 2023-03-19 DIAGNOSIS — Z170 Estrogen receptor positive status [ER+]: Secondary | ICD-10-CM | POA: Insufficient documentation

## 2023-03-19 DIAGNOSIS — K589 Irritable bowel syndrome without diarrhea: Secondary | ICD-10-CM | POA: Diagnosis not present

## 2023-03-19 DIAGNOSIS — E785 Hyperlipidemia, unspecified: Secondary | ICD-10-CM | POA: Insufficient documentation

## 2023-03-19 DIAGNOSIS — Z7984 Long term (current) use of oral hypoglycemic drugs: Secondary | ICD-10-CM | POA: Diagnosis not present

## 2023-03-19 DIAGNOSIS — Z79899 Other long term (current) drug therapy: Secondary | ICD-10-CM | POA: Diagnosis not present

## 2023-03-19 DIAGNOSIS — Z803 Family history of malignant neoplasm of breast: Secondary | ICD-10-CM | POA: Diagnosis not present

## 2023-03-19 DIAGNOSIS — E119 Type 2 diabetes mellitus without complications: Secondary | ICD-10-CM | POA: Diagnosis not present

## 2023-03-19 DIAGNOSIS — E559 Vitamin D deficiency, unspecified: Secondary | ICD-10-CM | POA: Diagnosis not present

## 2023-03-20 ENCOUNTER — Encounter (HOSPITAL_BASED_OUTPATIENT_CLINIC_OR_DEPARTMENT_OTHER): Payer: Self-pay | Admitting: General Surgery

## 2023-03-20 ENCOUNTER — Other Ambulatory Visit: Payer: Self-pay

## 2023-03-23 LAB — COLOGUARD: COLOGUARD: NEGATIVE

## 2023-03-26 ENCOUNTER — Other Ambulatory Visit: Payer: Self-pay | Admitting: Internal Medicine

## 2023-03-27 ENCOUNTER — Ambulatory Visit
Admission: RE | Admit: 2023-03-27 | Discharge: 2023-03-27 | Disposition: A | Discharge status: Home / Self Care | Payer: BC Managed Care – PPO | Source: Ambulatory Visit | Attending: General Surgery | Admitting: General Surgery

## 2023-03-27 ENCOUNTER — Encounter (HOSPITAL_BASED_OUTPATIENT_CLINIC_OR_DEPARTMENT_OTHER)
Admission: RE | Admit: 2023-03-27 | Discharge: 2023-03-27 | Disposition: A | Discharge status: Home / Self Care | Payer: BC Managed Care – PPO | Source: Ambulatory Visit | Attending: General Surgery | Admitting: General Surgery

## 2023-03-27 DIAGNOSIS — E669 Obesity, unspecified: Secondary | ICD-10-CM | POA: Diagnosis not present

## 2023-03-27 DIAGNOSIS — Z803 Family history of malignant neoplasm of breast: Secondary | ICD-10-CM | POA: Diagnosis not present

## 2023-03-27 DIAGNOSIS — Z1722 Progesterone receptor negative status: Secondary | ICD-10-CM | POA: Diagnosis not present

## 2023-03-27 DIAGNOSIS — D0511 Intraductal carcinoma in situ of right breast: Secondary | ICD-10-CM | POA: Diagnosis not present

## 2023-03-27 DIAGNOSIS — J45909 Unspecified asthma, uncomplicated: Secondary | ICD-10-CM | POA: Diagnosis not present

## 2023-03-27 DIAGNOSIS — Z170 Estrogen receptor positive status [ER+]: Secondary | ICD-10-CM | POA: Diagnosis not present

## 2023-03-27 DIAGNOSIS — K219 Gastro-esophageal reflux disease without esophagitis: Secondary | ICD-10-CM | POA: Diagnosis not present

## 2023-03-27 DIAGNOSIS — Z6829 Body mass index (BMI) 29.0-29.9, adult: Secondary | ICD-10-CM | POA: Diagnosis not present

## 2023-03-27 DIAGNOSIS — Z7984 Long term (current) use of oral hypoglycemic drugs: Secondary | ICD-10-CM | POA: Diagnosis not present

## 2023-03-27 DIAGNOSIS — I10 Essential (primary) hypertension: Secondary | ICD-10-CM | POA: Diagnosis not present

## 2023-03-27 DIAGNOSIS — E119 Type 2 diabetes mellitus without complications: Secondary | ICD-10-CM | POA: Diagnosis not present

## 2023-03-27 HISTORY — PX: BREAST BIOPSY: SHX20

## 2023-03-27 LAB — BASIC METABOLIC PANEL
Anion gap: 5 (ref 5–15)
BUN: 14 mg/dL (ref 6–20)
CO2: 25 mmol/L (ref 22–32)
Calcium: 8.8 mg/dL — ABNORMAL LOW (ref 8.9–10.3)
Chloride: 109 mmol/L (ref 98–111)
Creatinine, Ser: 0.66 mg/dL (ref 0.44–1.00)
GFR, Estimated: >60 mL/min (ref >60)
Glucose, Bld: 109 mg/dL — ABNORMAL HIGH (ref 70–99)
Potassium: 4.4 mmol/L (ref 3.5–5.1)
Sodium: 139 mmol/L (ref 135–145)

## 2023-03-27 MED ORDER — CHLORHEXIDINE GLUCONATE CLOTH 2 % EX PADS: 6.0000 | MEDICATED_PAD | Freq: Once | CUTANEOUS | Status: DC

## 2023-03-27 MED ORDER — ENSURE PRE-SURGERY PO LIQD: 296.0000 mL | Freq: Once | ORAL | Status: DC

## 2023-03-27 NOTE — H&P (Signed)
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 She has a family history and a paternal grandmother at age 67. She had no mass or no discharge. She underwent a screening mammogram. This shows her to have C density breast tissue. There was some right breast calcifications. Magnification views showed a 1.5 cm area of faint pleomorphic microcalcifications. This underwent a biopsy. 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 She is here with her husband to discuss options. 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  Review of Systems: A complete review of systems was obtained from the patient. I have reviewed this information and discussed as appropriate with the patient. See HPI as well for other ROS.  Review of Systems  Psychiatric/Behavioral: The patient is nervous/anxious.  All other systems reviewed and are negative.  Medical History: Past Medical History:  Diagnosis Date  Anxiety  Asthma, unspecified asthma severity, unspecified whether complicated, unspecified whether persistent (HHS-HCC)  Diabetes mellitus without complication (CMS/HHS-HCC)  GERD (gastroesophageal reflux disease)  History of cancer   Past Surgical History:  Procedure Laterality Date  CHOLECYSTECTOMY  HYSTERECTOMY  JOINT REPLACEMENT   Allergies  Allergen Reactions  Codeine Nausea  Penicillin Other (See Comments)  SEIZURES  Stadol [Butorphanol] Other (See Comments)  HALLUCINATIONS  Sulfa (Sulfonamide Antibiotics) Nausea   Current Outpatient Medications on File Prior to Visit  Medication Sig Dispense Refill  ALPRAZolam (XANAX) 0.5 MG tablet Take 0.5 mg by mouth 2 (two) times daily as needed  ascorbic acid, vitamin C, (VITAMIN C) 500 MG tablet Take 500 mg by mouth once daily  aspirin 81 MG EC tablet Take 81 mg by mouth every evening  cetirizine (ZYRTEC) 10 MG tablet Take 10 mg by  mouth once daily  FLUoxetine (PROZAC) 40 MG capsule Take 1 capsule by mouth once daily  JARDIANCE 10 mg tablet Take 1 tablet by mouth once daily  losartan (COZAAR) 50 MG tablet TAKE 2 TABLETS (100MG ) BY MOUTH EVERY DAY  omega-3 fatty acids/fish oil (FISH OIL) 340-1,000 mg capsule Take 1,000 mg by mouth once daily  ondansetron (ZOFRAN) 4 MG tablet Take 4 mg by mouth every 8 (eight) hours as needed  OZEMPIC 2 mg/dose (8 mg/3 mL) pen injector  pantoprazole (PROTONIX) 40 MG DR tablet Take 40 mg by mouth once daily  topiramate (TOPAMAX) 200 MG tablet TAKE 1/2-1 TABLETS BY MOUTH DAILY  venlafaxine (EFFEXOR-XR) 37.5 MG XR capsule Take 1 capsule by mouth daily with breakfast  vitamin E 1000 UNIT capsule Take 1,000 Units by mouth once daily   Family History  Problem Relation Age of Onset  Coronary Artery Disease (Blocked arteries around heart) Mother  High blood pressure (Hypertension) Mother  Hyperlipidemia (Elevated cholesterol) Mother  Diabetes Mother  Skin cancer Father  Coronary Artery Disease (Blocked arteries around heart) Brother  Breast cancer Paternal Grandmother    Social History   Tobacco Use  Smoking Status Never  Smokeless Tobacco Never  Marital status: Married  Tobacco Use  Smoking status: Never  Smokeless tobacco: Never  Vaping Use  Vaping status: Never Used  Substance and Sexual Activity  Alcohol use: Not Currently  Drug use: Never   Objective:   Vitals:  03/15/23 1020  BP: 114/70  Pulse: 70  Temp: 36.7 C (98.1 F)  SpO2: 97%  Weight: 68.6 kg (151 lb 3.2 oz)  Height: 152.4 cm (5')  PainSc: 0-No  pain   Body mass index is 29.53 kg/m.  Physical Exam Vitals reviewed.  Constitutional:  Appearance: Normal appearance.  Chest:  Breasts: Right: No inverted nipple, mass or nipple discharge.  Left: No inverted nipple, mass or nipple discharge.  Lymphadenopathy:  Upper Body:  Right upper body: No supraclavicular or axillary adenopathy.  Left upper body: No  supraclavicular or axillary adenopathy.  Neurological:  Mental Status: She is alert.   Assessment and Plan:  Ductal carcinoma in situ (DCIS) of right breast  Right breast seed guided lumpectomy  We discussed the staging and pathophysiology of breast cancer. We discussed all of the different options for treatment for breast cancer including surgery, chemotherapy, radiation therapy, Herceptin, and antiestrogen therapy.  We discussed a sentinel lymph node biopsy and I do not think she needs one due to DCIS.  We discussed the options for treatment of the breast cancer which included lumpectomy versus a mastectomy. We discussed the performance of the lumpectomy with radioactive seed placement. We discussed a 5-10% chance of a positive margin requiring reexcision in the operating room. We also discussed that she will likely need radiation therapy if she undergoes lumpectomy. We discussed mastectomy and the postoperative care for that as well. Mastectomy can be followed by reconstruction.Most mastectomy patients will not need radiation therapy. We discussed that there is no difference in her survival whether she undergoes lumpectomy with radiation therapy or antiestrogen therapy versus a mastectomy. There is also no real difference between her recurrence in the breast.  We discussed the risks of operation including bleeding, infection, possible reoperation. She understands her further therapy will be based on what her stages at the time of her operation.

## 2023-03-27 NOTE — Progress Notes (Signed)

## 2023-03-28 ENCOUNTER — Other Ambulatory Visit: Payer: Self-pay

## 2023-03-28 ENCOUNTER — Encounter (HOSPITAL_BASED_OUTPATIENT_CLINIC_OR_DEPARTMENT_OTHER)
Admission: RE | Disposition: A | Discharge status: Home / Self Care | Payer: Self-pay | Source: Home / Self Care | Attending: General Surgery

## 2023-03-28 ENCOUNTER — Ambulatory Visit (HOSPITAL_BASED_OUTPATIENT_CLINIC_OR_DEPARTMENT_OTHER)
Admission: RE | Admit: 2023-03-28 | Discharge: 2023-03-28 | Disposition: A | Discharge status: Home / Self Care | Payer: BC Managed Care – PPO | Attending: General Surgery | Admitting: General Surgery

## 2023-03-28 ENCOUNTER — Encounter (HOSPITAL_BASED_OUTPATIENT_CLINIC_OR_DEPARTMENT_OTHER): Payer: Self-pay | Admitting: General Surgery

## 2023-03-28 ENCOUNTER — Ambulatory Visit (HOSPITAL_BASED_OUTPATIENT_CLINIC_OR_DEPARTMENT_OTHER): Payer: BC Managed Care – PPO | Admitting: Anesthesiology

## 2023-03-28 ENCOUNTER — Ambulatory Visit
Admission: RE | Admit: 2023-03-28 | Discharge: 2023-03-28 | Disposition: A | Discharge status: Home / Self Care | Payer: BC Managed Care – PPO | Source: Ambulatory Visit | Attending: General Surgery | Admitting: General Surgery

## 2023-03-28 ENCOUNTER — Ambulatory Visit (HOSPITAL_BASED_OUTPATIENT_CLINIC_OR_DEPARTMENT_OTHER): Payer: Self-pay | Admitting: Anesthesiology

## 2023-03-28 DIAGNOSIS — Z6829 Body mass index (BMI) 29.0-29.9, adult: Secondary | ICD-10-CM | POA: Diagnosis not present

## 2023-03-28 DIAGNOSIS — Z803 Family history of malignant neoplasm of breast: Secondary | ICD-10-CM | POA: Insufficient documentation

## 2023-03-28 DIAGNOSIS — Z7984 Long term (current) use of oral hypoglycemic drugs: Secondary | ICD-10-CM | POA: Diagnosis not present

## 2023-03-28 DIAGNOSIS — Z170 Estrogen receptor positive status [ER+]: Secondary | ICD-10-CM | POA: Diagnosis not present

## 2023-03-28 DIAGNOSIS — K219 Gastro-esophageal reflux disease without esophagitis: Secondary | ICD-10-CM | POA: Diagnosis not present

## 2023-03-28 DIAGNOSIS — D0511 Intraductal carcinoma in situ of right breast: Secondary | ICD-10-CM | POA: Insufficient documentation

## 2023-03-28 DIAGNOSIS — F32A Depression, unspecified: Secondary | ICD-10-CM | POA: Diagnosis not present

## 2023-03-28 DIAGNOSIS — E119 Type 2 diabetes mellitus without complications: Secondary | ICD-10-CM | POA: Diagnosis not present

## 2023-03-28 DIAGNOSIS — I10 Essential (primary) hypertension: Secondary | ICD-10-CM | POA: Diagnosis not present

## 2023-03-28 DIAGNOSIS — Z1722 Progesterone receptor negative status: Secondary | ICD-10-CM | POA: Insufficient documentation

## 2023-03-28 DIAGNOSIS — N62 Hypertrophy of breast: Secondary | ICD-10-CM | POA: Diagnosis not present

## 2023-03-28 DIAGNOSIS — E669 Obesity, unspecified: Secondary | ICD-10-CM | POA: Insufficient documentation

## 2023-03-28 DIAGNOSIS — J45909 Unspecified asthma, uncomplicated: Secondary | ICD-10-CM | POA: Insufficient documentation

## 2023-03-28 DIAGNOSIS — N6031 Fibrosclerosis of right breast: Secondary | ICD-10-CM | POA: Diagnosis not present

## 2023-03-28 DIAGNOSIS — N6011 Diffuse cystic mastopathy of right breast: Secondary | ICD-10-CM | POA: Diagnosis not present

## 2023-03-28 DIAGNOSIS — N641 Fat necrosis of breast: Secondary | ICD-10-CM | POA: Diagnosis not present

## 2023-03-28 HISTORY — PX: BREAST LUMPECTOMY WITH RADIOACTIVE SEED LOCALIZATION: SHX6424

## 2023-03-28 LAB — GLUCOSE, CAPILLARY
Glucose-Capillary: 68 mg/dL — ABNORMAL LOW (ref 70–99)
Glucose-Capillary: 103 mg/dL — ABNORMAL HIGH (ref 70–99)

## 2023-03-28 SURGERY — BREAST LUMPECTOMY WITH RADIOACTIVE SEED LOCALIZATION
Anesthesia: General | Site: Breast | Laterality: Right

## 2023-03-28 MED ORDER — LIDOCAINE 2% (20 MG/ML) 5 ML SYRINGE
INTRAMUSCULAR | Status: DC | PRN
  Administered 2023-03-28: 60 mg via INTRAVENOUS

## 2023-03-28 MED ORDER — CIPROFLOXACIN IN D5W 400 MG/200ML IV SOLN
INTRAVENOUS | Status: AC
  Filled 2023-03-28: qty 200

## 2023-03-28 MED ORDER — FAMOTIDINE IN NACL 20-0.9 MG/50ML-% IV SOLN
INTRAVENOUS | Status: AC
  Filled 2023-03-28: qty 50

## 2023-03-28 MED ORDER — FAMOTIDINE 20 MG PO TABS
ORAL_TABLET | ORAL | Status: AC
  Filled 2023-03-28: qty 1

## 2023-03-28 MED ORDER — FENTANYL CITRATE (PF) 100 MCG/2ML IJ SOLN
INTRAMUSCULAR | Status: AC
  Filled 2023-03-28: qty 2

## 2023-03-28 MED ORDER — SCOPOLAMINE 1 MG/3DAYS TD PT72
MEDICATED_PATCH | TRANSDERMAL | Status: AC
  Filled 2023-03-28: qty 1

## 2023-03-28 MED ORDER — DEXAMETHASONE SODIUM PHOSPHATE 10 MG/ML IJ SOLN
INTRAMUSCULAR | Status: AC
  Filled 2023-03-28: qty 1

## 2023-03-28 MED ORDER — ONDANSETRON HCL 4 MG/2ML IJ SOLN
INTRAMUSCULAR | Status: AC
  Filled 2023-03-28: qty 2

## 2023-03-28 MED ORDER — ONDANSETRON HCL 4 MG/2ML IJ SOLN: 4.0000 mg | Freq: Once | INTRAMUSCULAR | Status: DC | PRN

## 2023-03-28 MED ORDER — OXYCODONE HCL 5 MG PO TABS
5.0000 mg | ORAL_TABLET | Freq: Once | ORAL | Status: AC | PRN
  Administered 2023-03-28: 5 mg via ORAL

## 2023-03-28 MED ORDER — HYDROMORPHONE HCL 1 MG/ML IJ SOLN
INTRAMUSCULAR | Status: AC
  Filled 2023-03-28: qty 0.5

## 2023-03-28 MED ORDER — ACETAMINOPHEN 325 MG PO TABS: 325.0000 mg | ORAL_TABLET | ORAL | Status: DC | PRN

## 2023-03-28 MED ORDER — OXYCODONE HCL 5 MG PO TABS
ORAL_TABLET | ORAL | Status: AC
  Filled 2023-03-28: qty 1

## 2023-03-28 MED ORDER — FENTANYL CITRATE (PF) 100 MCG/2ML IJ SOLN
INTRAMUSCULAR | Status: DC | PRN
  Administered 2023-03-28 (×2): 50 ug via INTRAVENOUS

## 2023-03-28 MED ORDER — SCOPOLAMINE 1 MG/3DAYS TD PT72
1.0000 | MEDICATED_PATCH | TRANSDERMAL | Status: DC
  Administered 2023-03-28: 1.5 mg via TRANSDERMAL

## 2023-03-28 MED ORDER — CIPROFLOXACIN IN D5W 400 MG/200ML IV SOLN
400.0000 mg | INTRAVENOUS | Status: AC
  Administered 2023-03-28: 400 mg via INTRAVENOUS

## 2023-03-28 MED ORDER — HYDROMORPHONE HCL 1 MG/ML IJ SOLN
0.2500 mg | INTRAMUSCULAR | Status: DC | PRN
Start: 2023-03-28 — End: 2023-03-28
  Administered 2023-03-28 (×2): 0.5 mg via INTRAVENOUS

## 2023-03-28 MED ORDER — MIDAZOLAM HCL 5 MG/5ML IJ SOLN
INTRAMUSCULAR | Status: DC | PRN
  Administered 2023-03-28: 2 mg via INTRAVENOUS

## 2023-03-28 MED ORDER — ACETAMINOPHEN 500 MG PO TABS
ORAL_TABLET | ORAL | Status: AC
Start: 2023-03-28 — End: ?
  Filled 2023-03-28: qty 2

## 2023-03-28 MED ORDER — FAMOTIDINE IN NACL 20-0.9 MG/50ML-% IV SOLN
20.0000 mg | Freq: Once | INTRAVENOUS | Status: AC
  Administered 2023-03-28: 20 mg via INTRAVENOUS

## 2023-03-28 MED ORDER — LIDOCAINE 2% (20 MG/ML) 5 ML SYRINGE
INTRAMUSCULAR | Status: AC
  Filled 2023-03-28: qty 5

## 2023-03-28 MED ORDER — BUPIVACAINE HCL (PF) 0.25 % IJ SOLN
INTRAMUSCULAR | Status: DC | PRN
  Administered 2023-03-28: 10 mL

## 2023-03-28 MED ORDER — FENTANYL CITRATE (PF) 100 MCG/2ML IJ SOLN
25.0000 ug | INTRAMUSCULAR | Status: DC | PRN
  Administered 2023-03-28 (×2): 50 ug via INTRAVENOUS

## 2023-03-28 MED ORDER — MEPERIDINE HCL 25 MG/ML IJ SOLN: 6.2500 mg | INTRAMUSCULAR | Status: DC | PRN

## 2023-03-28 MED ORDER — ACETAMINOPHEN 160 MG/5ML PO SOLN: 325.0000 mg | ORAL | Status: DC | PRN

## 2023-03-28 MED ORDER — MIDAZOLAM HCL 2 MG/2ML IJ SOLN
INTRAMUSCULAR | Status: AC
  Filled 2023-03-28: qty 2

## 2023-03-28 MED ORDER — PROPOFOL 10 MG/ML IV BOLUS
INTRAVENOUS | Status: DC | PRN
  Administered 2023-03-28: 200 mg via INTRAVENOUS

## 2023-03-28 MED ORDER — DEXAMETHASONE SODIUM PHOSPHATE 4 MG/ML IJ SOLN
INTRAMUSCULAR | Status: DC | PRN
  Administered 2023-03-28: 10 mg via INTRAVENOUS

## 2023-03-28 MED ORDER — OXYCODONE HCL 5 MG/5ML PO SOLN: 5.0000 mg | Freq: Once | ORAL | Status: AC | PRN

## 2023-03-28 MED ORDER — ONDANSETRON HCL 4 MG/2ML IJ SOLN
INTRAMUSCULAR | Status: DC | PRN
  Administered 2023-03-28: 4 mg via INTRAVENOUS

## 2023-03-28 MED ORDER — ACETAMINOPHEN 500 MG PO TABS
1000.0000 mg | ORAL_TABLET | ORAL | Status: AC
  Administered 2023-03-28: 1000 mg via ORAL

## 2023-03-28 MED ORDER — PROPOFOL 500 MG/50ML IV EMUL
INTRAVENOUS | Status: DC | PRN
  Administered 2023-03-28: 200 ug/kg/min via INTRAVENOUS

## 2023-03-28 MED ORDER — LACTATED RINGERS IV SOLN: INTRAVENOUS | Status: DC

## 2023-03-28 SURGICAL SUPPLY — 59 items
ADH SKN CLS APL DERMABOND .7 (GAUZE/BANDAGES/DRESSINGS) ×1
APL PRP STRL LF DISP 70% ISPRP (MISCELLANEOUS) ×1
APPLIER CLIP 9.375 MED OPEN (MISCELLANEOUS)
APR CLP MED 9.3 20 MLT OPN (MISCELLANEOUS)
BINDER BREAST LRG (GAUZE/BANDAGES/DRESSINGS) IMPLANT
BINDER BREAST MEDIUM (GAUZE/BANDAGES/DRESSINGS) IMPLANT
BINDER BREAST XLRG (GAUZE/BANDAGES/DRESSINGS) IMPLANT
BINDER BREAST XXLRG (GAUZE/BANDAGES/DRESSINGS) IMPLANT
BLADE SURG 15 STRL LF DISP TIS (BLADE) ×1 IMPLANT
BLADE SURG 15 STRL SS (BLADE) ×1
CANISTER SUC SOCK COL 7IN (MISCELLANEOUS) IMPLANT
CANISTER SUCT 1200ML W/VALVE (MISCELLANEOUS) IMPLANT
CHLORAPREP W/TINT 26 (MISCELLANEOUS) ×1 IMPLANT
CLIP APPLIE 9.375 MED OPEN (MISCELLANEOUS) IMPLANT
CLIP TI WIDE RED SMALL 6 (CLIP) IMPLANT
COVER BACK TABLE 60X90IN (DRAPES) ×1 IMPLANT
COVER MAYO STAND STRL (DRAPES) ×1 IMPLANT
COVER PROBE CYLINDRICAL 5X96 (MISCELLANEOUS) ×1 IMPLANT
DERMABOND ADVANCED .7 DNX12 (GAUZE/BANDAGES/DRESSINGS) ×1 IMPLANT
DRAPE LAPAROSCOPIC ABDOMINAL (DRAPES) ×1 IMPLANT
DRAPE UTILITY XL STRL (DRAPES) ×1 IMPLANT
DRSG TEGADERM 4X4.75 (GAUZE/BANDAGES/DRESSINGS) IMPLANT
ELECT COATED BLADE 2.86 ST (ELECTRODE) ×2 IMPLANT
ELECT REM PT RETURN 9FT ADLT (ELECTROSURGICAL) ×1
ELECTRODE REM PT RTRN 9FT ADLT (ELECTROSURGICAL) ×2 IMPLANT
GAUZE SPONGE 4X4 12PLY STRL LF (GAUZE/BANDAGES/DRESSINGS) IMPLANT
GLOVE BIO SURGEON STRL SZ7 (GLOVE) ×2 IMPLANT
GLOVE BIOGEL PI IND STRL 6.5 (GLOVE) IMPLANT
GLOVE BIOGEL PI IND STRL 7.0 (GLOVE) IMPLANT
GLOVE BIOGEL PI IND STRL 7.5 (GLOVE) ×1 IMPLANT
GLOVE SURG SS PI 6.5 STRL IVOR (GLOVE) IMPLANT
GOWN STRL REUS W/ TWL LRG LVL3 (GOWN DISPOSABLE) ×2 IMPLANT
GOWN STRL REUS W/TWL 2XL LVL3 (GOWN DISPOSABLE) IMPLANT
GOWN STRL REUS W/TWL LRG LVL3 (GOWN DISPOSABLE) ×2
HEMOSTAT ARISTA ABSORB 3G PWDR (HEMOSTASIS) IMPLANT
KIT MARKER MARGIN INK (KITS) ×1 IMPLANT
NDL HYPO 25X1 1.5 SAFETY (NEEDLE) ×1 IMPLANT
NEEDLE HYPO 25X1 1.5 SAFETY (NEEDLE) ×1
NS IRRIG 1000ML POUR BTL (IV SOLUTION) IMPLANT
PACK BASIN DAY SURGERY FS (CUSTOM PROCEDURE TRAY) ×1 IMPLANT
PENCIL SMOKE EVACUATOR (MISCELLANEOUS) ×2 IMPLANT
RETRACTOR ONETRAX LX 90X20 (MISCELLANEOUS) IMPLANT
SLEEVE SCD COMPRESS KNEE MED (STOCKING) ×2 IMPLANT
SPIKE FLUID TRANSFER (MISCELLANEOUS) IMPLANT
SPONGE T-LAP 4X18 ~~LOC~~+RFID (SPONGE) ×1 IMPLANT
STRIP CLOSURE SKIN 1/2X4 (GAUZE/BANDAGES/DRESSINGS) ×1 IMPLANT
SUT MNCRL AB 4-0 PS2 18 (SUTURE) ×2 IMPLANT
SUT MON AB 5-0 PS2 18 (SUTURE) IMPLANT
SUT SILK 2 0 SH (SUTURE) IMPLANT
SUT VIC AB 2-0 SH 27 (SUTURE) ×2
SUT VIC AB 2-0 SH 27XBRD (SUTURE) ×2 IMPLANT
SUT VIC AB 3-0 SH 27 (SUTURE) ×1
SUT VIC AB 3-0 SH 27X BRD (SUTURE) ×1 IMPLANT
SUT VIC AB 5-0 PS2 18 (SUTURE) IMPLANT
SYR CONTROL 10ML LL (SYRINGE) ×2 IMPLANT
TOWEL GREEN STERILE FF (TOWEL DISPOSABLE) ×2 IMPLANT
TRAY FAXITRON CT DISP (TRAY / TRAY PROCEDURE) ×2 IMPLANT
TUBE CONNECTING 20X1/4 (TUBING) IMPLANT
YANKAUER SUCT BULB TIP NO VENT (SUCTIONS) IMPLANT

## 2023-03-28 NOTE — Op Note (Signed)
Preoperative diagnosis: Clinical stage 0 right breast cancer Postoperative diagnosis: Same as above Procedure:  Right breast radioactive seed guided lumpectomy Surgeon: Dr. Harden Mo Anesthesia: General  Complications: None Drains: None Estimated blood loss: Less than 50 cc Specimens: 1.  Right breast tissue containing seed and 2 clip 2.  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 She has a family history and a paternal grandmother at age 15.She underwent a screening mammogram.. There was some right breast calcifications. Magnification views showed a 1.5 cm area of faint pleomorphic microcalcifications. This underwent a biopsy. 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 We discussed a lumpectomy   Procedure: After informed consent was obtained she was taken to the operating room.She was given antibiotics.  SCDs were in place.  She was placed under general anesthesia without complication.  She was prepped and draped in the standard sterile surgical fashion.  A surgical timeout was then performed.  I then identified the seed in the UOQ.   I made a curvilinear incision between them and then used the neoprobe to remove the seed and the surrounding tissue to get a clear margin  I confirmed removal of the seed and the clip with mammography.  I then thought I was close to a couple margins on CT imaging so I did remove these as above.  Hemostasis was obtained.  I placed clips in the cavity.  I then closed the cavity with 2-0 Vicryl.  The skin was closed with 3-0 Vicryl and 5-0 Monocryl.  Eventually glue and Steri-Strips were both applied.   She tolerated this well was extubated and  transferred to recovery stable.

## 2023-03-28 NOTE — Interval H&P Note (Signed)
History and Physical Interval Note:  03/28/2023 2:01 PM  Amy Lang  has presented today for surgery, with the diagnosis of RIGHT BREAST DUCTAL CARCINOMA IN SITU.  The various methods of treatment have been discussed with the patient and family. After consideration of risks, benefits and other options for treatment, the patient has consented to  Procedure(s) with comments: RIGHT BREAST SEED GUIDED LUMPECTOMY (Right) - LMA as a surgical intervention.  The patient's history has been reviewed, patient examined, no change in status, stable for surgery.  I have reviewed the patient's chart and labs.  Questions were answered to the patient's satisfaction.     Emelia Loron

## 2023-03-28 NOTE — Progress Notes (Signed)
Pt received oxycodone and pt was concerned allergy to codeine was problematic with her receiving this drug. Called Dr. Sampson Goon, no new orders at this time - pt not presenting with hives, nausea or vomiting.

## 2023-03-28 NOTE — Anesthesia Procedure Notes (Signed)
Procedure Name: LMA Insertion Date/Time: 03/28/2023 2:29 PM  Performed by: Burna Cash, CRNAPre-anesthesia Checklist: Patient identified, Emergency Drugs available, Suction available and Patient being monitored Patient Re-evaluated:Patient Re-evaluated prior to induction Oxygen Delivery Method: Circle system utilized Preoxygenation: Pre-oxygenation with 100% oxygen Induction Type: IV induction Ventilation: Mask ventilation without difficulty LMA: LMA inserted LMA Size: 4.0 Number of attempts: 1 Airway Equipment and Method: Bite block Placement Confirmation: positive ETCO2 Tube secured with: Tape Dental Injury: Teeth and Oropharynx as per pre-operative assessment

## 2023-03-28 NOTE — Progress Notes (Signed)
RN called Dr Sampson Goon regarding pt complaints of pain with inspiration, pt states it is similar to indigestion that she gets at home but worse. Pt oxygen saturation is 99 on room air, clear breath sounds bilaterally, does not appear to be in respiratory distress. New orders, ok to dc per Dr Sampson Goon.

## 2023-03-28 NOTE — Anesthesia Preprocedure Evaluation (Addendum)
Anesthesia Evaluation  Patient identified by MRN, date of birth, ID band Patient awake    Reviewed: Allergy & Precautions, NPO status , Patient's Chart, lab work & pertinent test results  History of Anesthesia Complications (+) PONV and history of anesthetic complications  Airway Mallampati: II  TM Distance: >3 FB Neck ROM: Full    Dental  (+) Teeth Intact, Dental Advisory Given   Pulmonary asthma    Pulmonary exam normal        Cardiovascular hypertension, Pt. on medications Normal cardiovascular exam     Neuro/Psych  Headaches PSYCHIATRIC DISORDERS Anxiety Depression     Neuromuscular disease    GI/Hepatic Neg liver ROS,GERD  Medicated and Controlled,,  Endo/Other  diabetes, Type 2, Insulin Dependent    Renal/GU negative Renal ROS  negative genitourinary   Musculoskeletal negative musculoskeletal ROS (+)    Abdominal   Peds negative pediatric ROS (+)  Hematology negative hematology ROS (+)   Anesthesia Other Findings On GLP-1a, last dose >1 week ago   Reproductive/Obstetrics                             Anesthesia Physical Anesthesia Plan  ASA: 3  Anesthesia Plan: General   Post-op Pain Management: Tylenol PO (pre-op)* and Celebrex PO (pre-op)*   Induction: Intravenous  PONV Risk Score and Plan: 4 or greater and Ondansetron, Dexamethasone, Midazolam, Scopolamine patch - Pre-op, Treatment may vary due to age or medical condition and TIVA  Airway Management Planned: LMA  Additional Equipment: None  Intra-op Plan:   Post-operative Plan: Extubation in OR  Informed Consent: I have reviewed the patients History and Physical, chart, labs and discussed the procedure including the risks, benefits and alternatives for the proposed anesthesia with the patient or authorized representative who has indicated his/her understanding and acceptance.     Dental advisory given  Plan  Discussed with: CRNA and Anesthesiologist  Anesthesia Plan Comments:         Anesthesia Quick Evaluation

## 2023-03-28 NOTE — Transfer of Care (Signed)
Immediate Anesthesia Transfer of Care Note  Patient: Amy Lang  Procedure(s) Performed: RIGHT BREAST SEED GUIDED LUMPECTOMY (Right: Breast)  Patient Location: PACU  Anesthesia Type:General  Level of Consciousness: awake, alert , and oriented  Airway & Oxygen Therapy: Patient Spontanous Breathing and Patient connected to face mask oxygen  Post-op Assessment: Report given to RN and Post -op Vital signs reviewed and stable  Post vital signs: Reviewed and stable  Last Vitals:  Vitals Value Taken Time  BP 108/62 03/28/23 1522  Temp 36.2 C 03/28/23 1522  Pulse 66 03/28/23 1524  Resp 15 03/28/23 1524  SpO2 100 % 03/28/23 1524  Vitals shown include unfiled device data.  Last Pain:  Vitals:   03/28/23 1209  TempSrc: Oral  PainSc: 0-No pain      Patients Stated Pain Goal: 7 (03/28/23 1209)  Complications: No notable events documented.

## 2023-03-28 NOTE — Discharge Instructions (Addendum)
Post Anesthesia Home Care Instructions  Activity: Get plenty of rest for the remainder of the day. A responsible individual must stay with you for 24 hours following the procedure.  For the next 24 hours, DO NOT: -Drive a car -Advertising copywriter -Drink alcoholic beverages -Take any medication unless instructed by your physician -Make any legal decisions or sign important papers.  Meals: Start with liquid foods such as gelatin or soup. Progress to regular foods as tolerated. Avoid greasy, spicy, heavy foods. If nausea and/or vomiting occur, drink only clear liquids until the nausea and/or vomiting subsides. Call your physician if vomiting continues.  Special Instructions/Symptoms: Your throat may feel dry or sore from the anesthesia or the breathing tube placed in your throat during surgery. If this causes discomfort, gargle with warm salt water. The discomfort should disappear within 24 hours.  If you had a scopolamine patch placed behind your ear for the management of post- operative nausea and/or vomiting:  1. The medication in the patch is effective for 72 hours, after which it should be removed.  Wrap patch in a tissue and discard in the trash. Wash hands thoroughly with soap and water. 2. You may remove the patch earlier than 72 hours if you experience unpleasant side effects which may include dry mouth, dizziness or visual disturbances. 3. Avoid touching the patch. Wash your hands with soap and water after contact with the patch.     Next dose of Tylenol may be given at 6:20pm if needed.   [facility] Office Phone Number [phone]  POST OP INSTRUCTIONS Take 400 mg of ibuprofen every 8 hours or 650 mg tylenol every 6 hours for next 72 hours then as needed. Use ice several times daily also.  A prescription for pain medication may be given to you upon discharge.  Take your pain medication as prescribed, if needed.  If narcotic pain medicine is not needed, then  you may take acetaminophen (Tylenol), naprosyn (Alleve) or ibuprofen (Advil) as needed. Take your usually prescribed medications unless otherwise directed If you need a refill on your pain medication, please contact your pharmacy.  They will contact our office to request authorization.  Prescriptions will not be filled after 5pm or on week-ends. You should eat very light the first 24 hours after surgery, such as soup, crackers, pudding, etc.  Resume your normal diet the day after surgery. Most patients will experience some swelling and bruising in the breast.  Ice packs and a good support bra will help.  Wear the breast binder provided or a sports bra for 72 hours day and night.  After that wear a sports bra during the day until you return to the office. Swelling and bruising can take several days to resolve.  It is common to experience some constipation if taking pain medication after surgery.  Increasing fluid intake and taking a stool softener will usually help or prevent this problem from occurring.  A mild laxative (Milk of Magnesia or Miralax) should be taken according to package directions if there are no bowel movements after 48 hours. I used skin glue on the incision, you may shower in 24 hours.  The glue will flake off over the next 2-3 weeks.  Any sutures or staples will be removed at the office during your follow-up visit. ACTIVITIES:  You may resume regular daily activities (gradually increasing) beginning the next day.  Wearing a good support bra or sports bra minimizes pain and swelling.  You may have sexual intercourse  when it is comfortable. You may drive when you no longer are taking prescription pain medication, you can comfortably wear a seatbelt, and you can safely maneuver your car and apply brakes. RETURN TO WORK:  ______________________________________________________________________________________ Amy Lang should see your doctor in the office for a follow-up appointment approximately  two weeks after your surgery.  Your doctor's nurse will typically make your follow-up appointment when she calls you with your pathology report.  Expect your pathology report 3-4 business days after your surgery.  You may call to check if you do not hear from [name] after three days. OTHER INSTRUCTIONS: _______________________________________________________________________________________________ _____________________________________________________________________________________________________________________________________ _____________________________________________________________________________________________________________________________________ _____________________________________________________________________________________________________________________________________  WHEN TO CALL DR Maeghan Canny: Fever over 101.0 Nausea and/or vomiting. Extreme swelling or bruising. Continued bleeding from incision. Increased pain, redness, or drainage from the incision.  The clinic staff is available to answer your questions during regular business hours.  Please don't hesitate to call and ask to speak to one of the nurses for clinical concerns.  If you have a medical emergency, go to the nearest emergency room or call 911.  A surgeon from [facility] is always on call at the hospital.  For further questions, please visit [email or web address]

## 2023-03-29 ENCOUNTER — Encounter (HOSPITAL_BASED_OUTPATIENT_CLINIC_OR_DEPARTMENT_OTHER): Payer: Self-pay | Admitting: General Surgery

## 2023-03-29 NOTE — Anesthesia Postprocedure Evaluation (Signed)
Anesthesia Post Note  Patient: Amy Lang  Procedure(s) Performed: RIGHT BREAST SEED GUIDED LUMPECTOMY (Right: Breast)     Patient location during evaluation: PACU Anesthesia Type: General Level of consciousness: awake and alert Pain management: pain level controlled Vital Signs Assessment: post-procedure vital signs reviewed and stable Respiratory status: spontaneous breathing, nonlabored ventilation and respiratory function stable Cardiovascular status: stable and blood pressure returned to baseline Anesthetic complications: no   No notable events documented.  Last Vitals:  Vitals:   03/28/23 1626 03/28/23 1730  BP: (!) 154/61 (!) 170/83  Pulse: 63 66  Resp: 16 16  Temp: (!) 36.1 C   SpO2: 97% 96%    Last Pain:  Vitals:   03/28/23 1626  TempSrc:   PainSc: 4                  Beryle Lathe

## 2023-03-29 NOTE — Plan of Care (Signed)
 [facility] Tonsillectomy/Adenoidectomy, Postoperative PEDS care plan entered in error.

## 2023-04-03 ENCOUNTER — Telehealth: Payer: Self-pay | Admitting: Radiation Oncology

## 2023-04-03 ENCOUNTER — Telehealth: Payer: Self-pay | Admitting: *Deleted

## 2023-04-03 ENCOUNTER — Encounter: Payer: Self-pay | Admitting: *Deleted

## 2023-04-03 DIAGNOSIS — D0511 Intraductal carcinoma in situ of right breast: Secondary | ICD-10-CM

## 2023-04-03 NOTE — Telephone Encounter (Signed)
Left message for patient to call back to schedule consult per 11/5 referral.

## 2023-04-03 NOTE — Telephone Encounter (Signed)
Per Dr. Pamelia Hoit, request prognostics to be ordered on final surgical specimen. Order sent to pathology

## 2023-04-04 ENCOUNTER — Telehealth: Payer: Self-pay | Admitting: Radiation Oncology

## 2023-04-04 LAB — SURGICAL PATHOLOGY

## 2023-04-04 NOTE — Telephone Encounter (Signed)
Left message for patient to call back to schedule consult per 11/5 referral.

## 2023-04-09 ENCOUNTER — Encounter: Payer: Self-pay | Admitting: Internal Medicine

## 2023-04-16 ENCOUNTER — Inpatient Hospital Stay: Payer: BC Managed Care – PPO | Attending: Hematology and Oncology | Admitting: Hematology and Oncology

## 2023-04-16 ENCOUNTER — Encounter: Payer: Self-pay | Admitting: *Deleted

## 2023-04-16 ENCOUNTER — Inpatient Hospital Stay: Payer: BC Managed Care – PPO

## 2023-04-16 VITALS — BP 109/56 | HR 72 | Temp 98.9°F | Resp 18 | Ht 60.0 in | Wt 149.0 lb

## 2023-04-16 DIAGNOSIS — K589 Irritable bowel syndrome without diarrhea: Secondary | ICD-10-CM | POA: Insufficient documentation

## 2023-04-16 DIAGNOSIS — E785 Hyperlipidemia, unspecified: Secondary | ICD-10-CM | POA: Diagnosis not present

## 2023-04-16 DIAGNOSIS — Z8544 Personal history of malignant neoplasm of other female genital organs: Secondary | ICD-10-CM | POA: Insufficient documentation

## 2023-04-16 DIAGNOSIS — F419 Anxiety disorder, unspecified: Secondary | ICD-10-CM | POA: Insufficient documentation

## 2023-04-16 DIAGNOSIS — Z79899 Other long term (current) drug therapy: Secondary | ICD-10-CM | POA: Insufficient documentation

## 2023-04-16 DIAGNOSIS — D0511 Intraductal carcinoma in situ of right breast: Secondary | ICD-10-CM | POA: Insufficient documentation

## 2023-04-16 DIAGNOSIS — Z170 Estrogen receptor positive status [ER+]: Secondary | ICD-10-CM | POA: Insufficient documentation

## 2023-04-16 DIAGNOSIS — E119 Type 2 diabetes mellitus without complications: Secondary | ICD-10-CM | POA: Diagnosis not present

## 2023-04-16 DIAGNOSIS — Z90710 Acquired absence of both cervix and uterus: Secondary | ICD-10-CM | POA: Diagnosis not present

## 2023-04-16 DIAGNOSIS — I10 Essential (primary) hypertension: Secondary | ICD-10-CM | POA: Diagnosis not present

## 2023-04-16 DIAGNOSIS — J45909 Unspecified asthma, uncomplicated: Secondary | ICD-10-CM | POA: Insufficient documentation

## 2023-04-16 DIAGNOSIS — Z91030 Bee allergy status: Secondary | ICD-10-CM | POA: Insufficient documentation

## 2023-04-16 DIAGNOSIS — F32A Depression, unspecified: Secondary | ICD-10-CM | POA: Insufficient documentation

## 2023-04-16 DIAGNOSIS — E669 Obesity, unspecified: Secondary | ICD-10-CM | POA: Diagnosis not present

## 2023-04-16 NOTE — Progress Notes (Signed)
[facility] CONSULT NOTE  Patient Care Team: Plotnikov, Georgina Quint, MD as PCP - General Herbie [place] Piedad Climes, MD as PCP - Cardiology (Cardiology) Freddy Finner, MD (Inactive) as Attending Physician (Obstetrics and Gynecology) Flo Shanks, MD as Attending Physician (Otolaryngology) Marcelle Overlie, MD as Consulting Physician (Obstetrics and Gynecology) Marykay Lex, MD as Consulting Physician (Cardiology) Pershing Proud, RN as Oncology Nurse Navigator Donnelly Angelica, RN as Oncology Nurse Navigator (Hematology and Oncology) Serena Croissant, MD as Consulting Physician (Hematology and Oncology)  CHIEF COMPLAINTS/PURPOSE OF CONSULTATION:  Newly diagnosed DCIS  HISTORY OF PRESENTING ILLNESS:   History of Present Illness   The patient, with a significant family history of breast cancer, presented for a follow-up consultation after undergoing surgery for Ductal Carcinoma In Situ (DCIS). The DCIS was detected during a routine mammogram and confirmed through subsequent biopsies. The patient reported healing well post-surgery. She has a past medical history of endometriosis, for which she underwent multiple surgeries, and vaginal cancer, treated with localized chemotherapy. The patient has tested negative for genetic predisposition to breast cancer. She also had a papilloma five years ago, which required no further treatment beyond surgical removal. 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        I reviewed her records extensively and collaborated the history with the patient.    MEDICAL HISTORY:  Past Medical History:  Diagnosis Date   Allergy    Anxiety    Asthma    Depression    Diabetes (HCC)    Dyslipidemia    Hypertension    IBS (irritable bowel syndrome)    Lactose intolerance    Migraines    Obesity    PONV (postoperative nausea and vomiting)    Shortness of breath    Vaginal cancer (HCC)    Vitamin B 12 deficiency    Vitamin D  deficiency     SURGICAL HISTORY: Past Surgical History:  Procedure Laterality Date   ABDOMINAL HYSTERECTOMY     still has bilateral ovaries   BREAST BIOPSY Right 03/02/2023   MM RT BREAST BX W LOC DEV 1ST LESION IMAGE BX SPEC STEREO GUIDE 03/02/2023 [facility]   BREAST BIOPSY  03/27/2023   MM RT RADIOACTIVE SEED LOC MAMMO GUIDE 03/27/2023 [facility]   BREAST EXCISIONAL BIOPSY Right 08/27/2017   BREAST LUMPECTOMY WITH RADIOACTIVE SEED LOCALIZATION Right 08/28/2017   Procedure: BREAST LUMPECTOMY WITH RADIOACTIVE SEED LOCALIZATION;  Surgeon: Harriette Bouillon, MD;  Location: [facility];  Service: General;  Laterality: Right;   BREAST LUMPECTOMY WITH RADIOACTIVE SEED LOCALIZATION Right 03/28/2023   Procedure: RIGHT BREAST SEED GUIDED LUMPECTOMY;  Surgeon: Emelia Loron, MD;  Location: [facility];  Service: General;  Laterality: Right;  LMA   CHOLECYSTECTOMY  04-04-11   single site   COLONOSCOPY W/ BIOPSIES  01/24/2013   ESOPHAGOGASTRODUODENOSCOPY     KNEE SURGERY     LAPAROTOMY  05/12/2012   Procedure: LAPAROTOMY;  Surgeon: Jeani Hawking, MD;  Location: [facility];  Service: Gynecology;;  laparotomy Maura Crandall salpingectomy-oopharectomy   NM MYOCAR PERF WALL MOTION  09/06/2009   normal   TRANSTHORACIC ECHOCARDIOGRAM  March 2014   Normal regional wall motion. EF 55%. Mild MR    SOCIAL HISTORY: Social History   Socioeconomic History   Marital status: Married    Spouse name: Zephyr Febles   Number of children: Not on file   Years of education: Not on file   Highest education level:  Not on file  Occupational History   Not on file  Tobacco Use   Smoking status: Never   Smokeless tobacco: Never   Tobacco comments:    Regular Exercise - No  Vaping Use   Vaping status: Never Used  Substance and Sexual Activity   Alcohol use: No   Drug use: No   Sexual activity: Yes    Comment: hysterectomy  Other Topics Concern   Not on file  Social  History Narrative   [name] with no children. Never smoked. Occasional alcohol.   Works as a Scientist, research (medical) at [employer]. Is on her feet but 6 days per week.   She does a treadmill at home and walking outside of these 3-4 times a week.   Social Determinants of Health   Financial Resource Strain: Not on file  Food Insecurity: No Food Insecurity (03/19/2023)   Hunger Vital Sign    Worried About Running Out of Food in the Last Year: Never true    Ran Out of Food in the Last Year: Never true  Transportation Needs: No Transportation Needs (03/19/2023)   PRAPARE - Administrator, Civil Service (Medical): No    Lack of Transportation (Non-Medical): No  Physical Activity: Not on file  Stress: Not on file  Social Connections: Not on file  Intimate Partner Violence: Not At Risk (03/19/2023)   Humiliation, Afraid, Rape, and Kick questionnaire    Fear of Current or Ex-Partner: No    Emotionally Abused: No    Physically Abused: No    Sexually Abused: No    FAMILY HISTORY: Family History  Problem Relation Age of Onset   Heart disease Mother    COPD Mother    Heart attack Mother 37   Diabetes Mother    Hypertension Mother    Hyperlipidemia Mother    Kidney disease Mother    Thyroid disease Mother    Lung cancer Father    Hypertension Father    Hyperlipidemia Father    Heart disease Maternal Grandmother    Breast cancer Maternal Grandmother 62   Heart disease Maternal Grandfather    Heart disease Paternal Grandmother    Heart disease Paternal Grandfather    Colon cancer Neg Hx    Esophageal cancer Neg Hx    Rectal cancer Neg Hx    Stomach cancer Neg Hx     ALLERGIES:  is allergic to bee pollen, butorphanol, butorphanol tartrate, codeine, codeine sulfate, crestor [rosuvastatin], darvocet [propoxyphene n-acetaminophen], estrogens, levofloxacin, metformin and related, penicillamine, penicillins, sulfa antibiotics, and latex.  MEDICATIONS:  Current  Outpatient Medications  Medication Sig Dispense Refill   albuterol (VENTOLIN HFA) 108 (90 Base) MCG/ACT inhaler TAKE 2 PUFFS BY MOUTH EVERY 6 HOURS AS NEEDED FOR WHEEZE OR SHORTNESS OF BREATH 54 each 3   ALPRAZolam (XANAX) 0.5 MG tablet Take 1 tablet (0.5 mg total) by mouth 2 (two) times daily as needed for anxiety. 60 tablet 3   amphetamine-dextroamphetamine (ADDERALL) 10 MG tablet Take 1 tablet (10 mg total) by mouth 2 (two) times daily. (Patient taking differently: Take 10 mg by mouth 2 (two) times daily. Takes 1/2 tab daily) 60 tablet 0   aspirin EC 81 MG tablet Take 81 mg by mouth every evening.     cetirizine (ZYRTEC) 10 MG tablet Take 10 mg by mouth daily.     Cholecalciferol (VITAMIN D3) 50 MCG (2000 UT) capsule Take 1 capsule (2,000 Units total) by mouth daily. 100 capsule 3  cyanocobalamin (VITAMIN B12) 1000 MCG/ML injection INJECT 1 ML INTO MUSCLE OR SUBCUTANEOUSLY EVERY 2 WEEKS (LIFETIME) 6 mL 3   FLUoxetine (PROZAC) 40 MG capsule TAKE 1 CAPSULE BY MOUTH EVERY DAY 90 capsule 1   fluticasone (FLONASE) 50 MCG/ACT nasal spray SPRAY 2 SPRAYS INTO EACH NOSTRIL EVERY DAY 48 mL 3   gabapentin (NEURONTIN) 100 MG capsule Take 1-2 capsules (100-200 mg total) by mouth at bedtime. (Patient taking differently: Take 100-200 mg by mouth daily as needed.) 180 capsule 1   JARDIANCE 10 MG TABS tablet TAKE 1 TABLET BY MOUTH EVERY DAY 90 tablet 1   losartan (COZAAR) 50 MG tablet TAKE 2 TABLETS (100MG ) BY MOUTH EVERY DAY 180 tablet 1   Multiple Vitamins-Minerals (PRESERVISION AREDS 2) CAPS Take 1 capsule by mouth at bedtime.     Omega-3 Fatty Acids (FISH OIL) 1000 MG CAPS Take 1,000 mg by mouth daily.     ondansetron (ZOFRAN) 4 MG tablet Take 1 tablet (4 mg total) by mouth every 8 (eight) hours as needed for nausea or vomiting. 20 tablet 1   pantoprazole (PROTONIX) 40 MG tablet Take 1 tablet (40 mg total) by mouth daily. 90 tablet 3   Probiotic Product (PROBIOTIC ADVANCED PO) Take by mouth daily.      Semaglutide, 1 MG/DOSE, 4 MG/3ML SOPN Inject 1 mg as directed once a week. 9 mL 1   SYRINGE-NEEDLE, DISP, 3 ML (BD ECLIPSE SYRINGE) 25G X 1" 3 ML MISC Use sq for B12 inj 50 each 3   topiramate (TOPAMAX) 200 MG tablet TAKE 1/2-1 TABLETS BY MOUTH DAILY 60 tablet 2   venlafaxine XR (EFFEXOR-XR) 37.5 MG 24 hr capsule TAKE 1 CAPSULE BY MOUTH DAILY WITH BREAKFAST. 90 capsule 2   vitamin C (ASCORBIC ACID) 500 MG tablet Take 500 mg by mouth daily.     vitamin E 1000 UNIT capsule Take 1,000 Units by mouth daily.     Zinc 50 MG CAPS Take by mouth.     No current facility-administered medications for this visit.    REVIEW OF SYSTEMS:   Constitutional: Denies fevers, chills or abnormal night sweats   All other systems were reviewed with the patient and are negative.  PHYSICAL EXAMINATION: ECOG PERFORMANCE STATUS: 0 - Asymptomatic  Vitals:   04/16/23 1244  BP: (!) 109/56  Pulse: 72  Resp: 18  Temp: 98.9 F (37.2 C)  SpO2: 98%   Filed Weights   04/16/23 1244  Weight: 149 lb (67.6 kg)    GENERAL:alert, no distress and comfortable    LABORATORY DATA:  I have reviewed the data as listed Lab Results  Component Value Date   WBC 8.7 05/24/2021   HGB 14.6 05/24/2021   HCT 43.5 05/24/2021   MCV 89.8 05/24/2021   PLT 193.0 05/24/2021   Lab Results  Component Value Date   NA 139 03/27/2023   K 4.4 03/27/2023   CL 109 03/27/2023   CO2 25 03/27/2023    RADIOGRAPHIC STUDIES: I have personally reviewed the radiological reports and agreed with the findings in the report.  ASSESSMENT AND PLAN:  Ductal carcinoma in situ of right breast 03/28/2023: Right lumpectomy: High-grade DCIS with necrosis and calcifications discontinuously involving fibrotic breast tissue, no invasive cancer, margins negative, ER 40%, PR 0%  Pathology counseling: I discussed the final pathology report of the patient provided  a copy of this report. I discussed the margins.  We also discussed the final staging  along with previously performed ER/PR testing.  Treatment  plan: Adjuvant radiation therapy Followed by adjuvant antiestrogen therapy    Past Medical History of Vaginal Cancer (at [facility] 17 years ago): Apparently treated with intravaginal chemotherapy infusion Discussed patient's past history of vaginal cancer and its potential impact on current treatment plan. - Request records from previous treatment to review and assess potential contraindications to Tamoxifen therapy.     Return to clinic at the end of radiation    All questions were answered. The patient knows to call the clinic with any problems, questions or concerns.    Tamsen Meek, MD 04/16/23

## 2023-04-16 NOTE — Assessment & Plan Note (Signed)
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 I discussed the margins.  We also discussed the final staging along with previously performed ER/PR testing.  Treatment plan: Adjuvant radiation therapy Followed by adjuvant antiestrogen therapy  Return to clinic at the end of radiation

## 2023-04-20 ENCOUNTER — Telehealth: Payer: Self-pay | Admitting: Radiation Oncology

## 2023-04-20 NOTE — Telephone Encounter (Signed)
Left message for patient to call back to reschedule 11/27 appointments due to provider in breast clinic.

## 2023-04-24 NOTE — Progress Notes (Signed)
Location of Breast Cancer:  Ductal carcinoma in situ of right breast   Histology per Pathology Report:    Receptor Status: ER(40), PR (negative),   Did patient present with symptoms (if so, please note symptoms) or was this found on screening mammography?: mammography  Past/Anticipated interventions by surgeon, if [id number]    Past/Anticipated interventions by medical oncology, if any:  Chemotherapy    Lymphedema issues, if any:  Denies   Pain issues, if any:  Reports continued tenderness to right breast   SAFETY ISSUES: Prior radiation? No Pacemaker/ICD? No Possible current pregnancy? No--hysterectomy  Is the patient on methotrexate? No  Current Complaints / other details:  Nothing else of note

## 2023-04-25 ENCOUNTER — Ambulatory Visit: Payer: BC Managed Care – PPO

## 2023-04-25 ENCOUNTER — Ambulatory Visit: Payer: BC Managed Care – PPO | Admitting: Radiation Oncology

## 2023-04-29 NOTE — Progress Notes (Signed)
Radiation Oncology         [phone] ________________________________  Name: Amy Lang MRN: [id number]  Date: 04/30/2023  DOB: 07/16/1965  Re-Evaluation Note  CC: Plotnikov, Georgina Quint, MD  Emelia Loron, MD  No diagnosis found.  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 She was seen in consultation on 03/19/23.   She opted to proceed with a right breast lumpectomy without nodal biopsies on 03/28/23 under the care of Dr. Dwain Sarna. Pathology from the procedure revealed: high grade DCIS with necrosis and calcifications; negative for invasive carcinoma; all margins negative for DCIS.  Prognostic indicators significant for: estrogen receptor 25% positive with moderate to strong staining intensity; progesterone receptor 0% negative; Her2 not assessed.   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 Dr. Pamelia Hoit has expressed some concern that her history of vaginal cancer and corresponding treatment may impact her treatment plan, especially pertaining to her anticipated adjuvant antiestrogen therapy. Dr. Pamelia Hoit has subsequently requested records from her previous treatment to review and assess for any potential contraindications to Tamoxifen therapy. She will return to Dr. Pamelia Hoit following XRT to discuss this topic further.   On review of systems, the patient reports ***. She denies *** and any other symptoms.    Allergies:  is allergic to bee pollen, butorphanol, butorphanol tartrate, codeine, codeine sulfate, crestor [rosuvastatin], darvocet [propoxyphene n-acetaminophen], estrogens, levofloxacin, metformin and related, penicillamine, penicillins, sulfa antibiotics, and  latex.  Meds: Current Outpatient Medications  Medication Sig Dispense Refill   albuterol (VENTOLIN HFA) 108 (90 Base) MCG/ACT inhaler TAKE 2 PUFFS BY MOUTH EVERY 6 HOURS AS NEEDED FOR WHEEZE OR SHORTNESS OF BREATH 54 each 3   ALPRAZolam (XANAX) 0.5 MG tablet Take 1 tablet (0.5 mg total) by mouth 2 (two) times daily as needed for anxiety. 60 tablet 3   amphetamine-dextroamphetamine (ADDERALL) 10 MG tablet Take 1 tablet (10 mg total) by mouth 2 (two) times daily. (Patient taking differently: Take 10 mg by mouth 2 (two) times daily. Takes 1/2 tab daily) 60 tablet 0   aspirin EC 81 MG tablet Take 81 mg by mouth every evening.     cetirizine (ZYRTEC) 10 MG tablet Take 10 mg by mouth daily.     Cholecalciferol (VITAMIN D3) 50 MCG (2000 UT) capsule Take 1 capsule (2,000 Units total) by mouth daily. 100 capsule 3   cyanocobalamin (VITAMIN B12) 1000 MCG/ML injection INJECT 1 ML INTO MUSCLE OR SUBCUTANEOUSLY EVERY 2 WEEKS (LIFETIME) 6 mL 3   FLUoxetine (PROZAC) 40 MG capsule TAKE 1 CAPSULE BY MOUTH EVERY DAY 90 capsule 1   fluticasone (FLONASE) 50 MCG/ACT nasal spray SPRAY 2 SPRAYS INTO EACH NOSTRIL EVERY DAY 48 mL 3   gabapentin (NEURONTIN) 100 MG capsule Take 1-2 capsules (100-200 mg total) by mouth at bedtime. (Patient taking differently: Take 100-200 mg by mouth daily as needed.) 180 capsule 1   JARDIANCE 10 MG TABS tablet TAKE 1 TABLET BY MOUTH EVERY DAY 90 tablet 1   losartan (COZAAR) 50 MG tablet TAKE 2 TABLETS (100MG ) BY MOUTH EVERY DAY 180 tablet 1   Multiple Vitamins-Minerals (PRESERVISION AREDS 2) CAPS Take 1 capsule by mouth at bedtime.     Omega-3 Fatty Acids (FISH  OIL) 1000 MG CAPS Take 1,000 mg by mouth daily.     ondansetron (ZOFRAN) 4 MG tablet Take 1 tablet (4 mg total) by mouth every 8 (eight) hours as needed for nausea or vomiting. 20 tablet 1   pantoprazole (PROTONIX) 40 MG tablet Take 1 tablet (40 mg total) by mouth daily. 90 tablet 3   Probiotic Product (PROBIOTIC ADVANCED PO) Take  by mouth daily.     Semaglutide, 1 MG/DOSE, 4 MG/3ML SOPN Inject 1 mg as directed once a week. 9 mL 1   SYRINGE-NEEDLE, DISP, 3 ML (BD ECLIPSE SYRINGE) 25G X 1" 3 ML MISC Use sq for B12 inj 50 each 3   topiramate (TOPAMAX) 200 MG tablet TAKE 1/2-1 TABLETS BY MOUTH DAILY 60 tablet 2   venlafaxine XR (EFFEXOR-XR) 37.5 MG 24 hr capsule TAKE 1 CAPSULE BY MOUTH DAILY WITH BREAKFAST. 90 capsule 2   vitamin C (ASCORBIC ACID) 500 MG tablet Take 500 mg by mouth daily.     vitamin E 1000 UNIT capsule Take 1,000 Units by mouth daily.     Zinc 50 MG CAPS Take by mouth.     No current facility-administered medications for this encounter.    Physical Findings: The patient is in no acute distress. Patient is alert and oriented.  vitals were not taken for this visit.  No significant changes. Lungs are clear to auscultation bilaterally. Heart has regular rate and rhythm. No palpable cervical, supraclavicular, or axillary adenopathy. Abdomen soft, non-tender, normal bowel sounds. Left Breast: no palpable mass, nipple discharge or bleeding. Right Breast: ***  Lab Findings: Lab Results  Component Value Date   WBC 8.7 05/24/2021   HGB 14.6 05/24/2021   HCT 43.5 05/24/2021   MCV 89.8 05/24/2021   PLT 193.0 05/24/2021    Radiographic Findings: No results found.  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 ***  -----------------------------------  Billie Lade, PhD, MD  This document serves as a record of services personally performed by Antony Blackbird, MD. It was created on his behalf by Neena Rhymes, a trained medical scribe. The creation of this record is based on the scribe's personal observations and the provider's statements to them. This document has been checked and approved by the attending  provider.

## 2023-04-30 ENCOUNTER — Encounter: Payer: Self-pay | Admitting: Radiation Oncology

## 2023-04-30 ENCOUNTER — Ambulatory Visit
Admission: RE | Admit: 2023-04-30 | Discharge: 2023-04-30 | Disposition: A | Discharge status: Home / Self Care | Payer: BC Managed Care – PPO | Source: Ambulatory Visit | Attending: Radiation Oncology | Admitting: Radiation Oncology

## 2023-04-30 VITALS — BP 119/64 | HR 69 | Temp 97.8°F | Resp 16 | Wt 147.4 lb

## 2023-04-30 DIAGNOSIS — Z170 Estrogen receptor positive status [ER+]: Secondary | ICD-10-CM | POA: Diagnosis not present

## 2023-04-30 DIAGNOSIS — D0511 Intraductal carcinoma in situ of right breast: Secondary | ICD-10-CM

## 2023-04-30 DIAGNOSIS — Z7982 Long term (current) use of aspirin: Secondary | ICD-10-CM | POA: Insufficient documentation

## 2023-04-30 DIAGNOSIS — R5383 Other fatigue: Secondary | ICD-10-CM | POA: Insufficient documentation

## 2023-04-30 DIAGNOSIS — Z923 Personal history of irradiation: Secondary | ICD-10-CM | POA: Diagnosis not present

## 2023-04-30 DIAGNOSIS — Z8544 Personal history of malignant neoplasm of other female genital organs: Secondary | ICD-10-CM | POA: Diagnosis not present

## 2023-04-30 DIAGNOSIS — Z79899 Other long term (current) drug therapy: Secondary | ICD-10-CM | POA: Insufficient documentation

## 2023-05-01 ENCOUNTER — Encounter: Payer: Self-pay | Admitting: *Deleted

## 2023-05-01 DIAGNOSIS — D0511 Intraductal carcinoma in situ of right breast: Secondary | ICD-10-CM

## 2023-05-04 DIAGNOSIS — D0511 Intraductal carcinoma in situ of right breast: Secondary | ICD-10-CM | POA: Diagnosis not present

## 2023-05-04 DIAGNOSIS — Z170 Estrogen receptor positive status [ER+]: Secondary | ICD-10-CM | POA: Diagnosis not present

## 2023-05-08 ENCOUNTER — Other Ambulatory Visit: Payer: Self-pay

## 2023-05-08 ENCOUNTER — Ambulatory Visit
Admission: RE | Admit: 2023-05-08 | Discharge: 2023-05-08 | Disposition: A | Discharge status: Home / Self Care | Payer: BC Managed Care – PPO | Source: Ambulatory Visit | Attending: Radiation Oncology | Admitting: Radiation Oncology

## 2023-05-08 DIAGNOSIS — Z170 Estrogen receptor positive status [ER+]: Secondary | ICD-10-CM | POA: Diagnosis not present

## 2023-05-08 DIAGNOSIS — D0511 Intraductal carcinoma in situ of right breast: Secondary | ICD-10-CM

## 2023-05-08 DIAGNOSIS — Z510 Encounter for antineoplastic radiation therapy: Secondary | ICD-10-CM | POA: Diagnosis not present

## 2023-05-08 LAB — RAD ONC ARIA SESSION SUMMARY
Course Elapsed Days: 0
Plan Fractions Treated to Date: 1
Plan Prescribed Dose Per Fraction: 2.67 Gy
Plan Total Fractions Prescribed: 15
Plan Total Prescribed Dose: 40.05 Gy
Reference Point Dosage Given to Date: 2.67 Gy
Reference Point Session Dosage Given: 2.67 Gy
Session Number: 1

## 2023-05-09 ENCOUNTER — Ambulatory Visit
Admission: RE | Admit: 2023-05-09 | Discharge: 2023-05-09 | Disposition: A | Discharge status: Home / Self Care | Payer: BC Managed Care – PPO | Source: Ambulatory Visit | Attending: Radiation Oncology | Admitting: Radiation Oncology

## 2023-05-09 ENCOUNTER — Other Ambulatory Visit: Payer: Self-pay

## 2023-05-09 ENCOUNTER — Ambulatory Visit: Payer: BC Managed Care – PPO

## 2023-05-09 DIAGNOSIS — Z170 Estrogen receptor positive status [ER+]: Secondary | ICD-10-CM | POA: Diagnosis not present

## 2023-05-09 DIAGNOSIS — Z510 Encounter for antineoplastic radiation therapy: Secondary | ICD-10-CM | POA: Diagnosis not present

## 2023-05-09 DIAGNOSIS — D0511 Intraductal carcinoma in situ of right breast: Secondary | ICD-10-CM | POA: Diagnosis not present

## 2023-05-09 LAB — RAD ONC ARIA SESSION SUMMARY
Course Elapsed Days: 1
Plan Fractions Treated to Date: 2
Plan Prescribed Dose Per Fraction: 2.67 Gy
Plan Total Fractions Prescribed: 15
Plan Total Prescribed Dose: 40.05 Gy
Reference Point Dosage Given to Date: 5.34 Gy
Reference Point Session Dosage Given: 2.67 Gy
Session Number: 2

## 2023-05-09 MED ORDER — RADIAPLEXRX EX GEL: Freq: Once | CUTANEOUS | Status: AC

## 2023-05-09 MED ORDER — ALRA NON-METALLIC DEODORANT (RAD-ONC)
1.0000 | Freq: Once | TOPICAL | Status: AC
  Administered 2023-05-09: 1 via TOPICAL

## 2023-05-10 ENCOUNTER — Other Ambulatory Visit: Payer: Self-pay

## 2023-05-10 ENCOUNTER — Ambulatory Visit
Admission: RE | Admit: 2023-05-10 | Discharge: 2023-05-10 | Disposition: A | Discharge status: Home / Self Care | Payer: BC Managed Care – PPO | Source: Ambulatory Visit | Attending: Radiation Oncology | Admitting: Radiation Oncology

## 2023-05-10 DIAGNOSIS — D0511 Intraductal carcinoma in situ of right breast: Secondary | ICD-10-CM | POA: Diagnosis not present

## 2023-05-10 DIAGNOSIS — Z510 Encounter for antineoplastic radiation therapy: Secondary | ICD-10-CM | POA: Diagnosis not present

## 2023-05-10 DIAGNOSIS — Z170 Estrogen receptor positive status [ER+]: Secondary | ICD-10-CM | POA: Diagnosis not present

## 2023-05-10 LAB — RAD ONC ARIA SESSION SUMMARY
Course Elapsed Days: 2
Plan Fractions Treated to Date: 3
Plan Prescribed Dose Per Fraction: 2.67 Gy
Plan Total Fractions Prescribed: 15
Plan Total Prescribed Dose: 40.05 Gy
Reference Point Dosage Given to Date: 8.01 Gy
Reference Point Session Dosage Given: 2.67 Gy
Session Number: 3

## 2023-05-11 ENCOUNTER — Ambulatory Visit
Admission: RE | Admit: 2023-05-11 | Discharge: 2023-05-11 | Disposition: A | Discharge status: Home / Self Care | Payer: BC Managed Care – PPO | Source: Ambulatory Visit | Attending: Radiation Oncology | Admitting: Radiation Oncology

## 2023-05-11 ENCOUNTER — Other Ambulatory Visit: Payer: Self-pay

## 2023-05-11 DIAGNOSIS — D0511 Intraductal carcinoma in situ of right breast: Secondary | ICD-10-CM | POA: Diagnosis not present

## 2023-05-11 DIAGNOSIS — Z510 Encounter for antineoplastic radiation therapy: Secondary | ICD-10-CM | POA: Diagnosis not present

## 2023-05-11 DIAGNOSIS — Z170 Estrogen receptor positive status [ER+]: Secondary | ICD-10-CM | POA: Diagnosis not present

## 2023-05-11 LAB — RAD ONC ARIA SESSION SUMMARY
Course Elapsed Days: 3
Plan Fractions Treated to Date: 4
Plan Prescribed Dose Per Fraction: 2.67 Gy
Plan Total Fractions Prescribed: 15
Plan Total Prescribed Dose: 40.05 Gy
Reference Point Dosage Given to Date: 10.68 Gy
Reference Point Session Dosage Given: 2.67 Gy
Session Number: 4

## 2023-05-14 ENCOUNTER — Other Ambulatory Visit: Payer: Self-pay

## 2023-05-14 ENCOUNTER — Ambulatory Visit
Admission: RE | Admit: 2023-05-14 | Discharge: 2023-05-14 | Disposition: A | Discharge status: Home / Self Care | Payer: BC Managed Care – PPO | Source: Ambulatory Visit | Attending: Radiation Oncology | Admitting: Radiation Oncology

## 2023-05-14 DIAGNOSIS — Z510 Encounter for antineoplastic radiation therapy: Secondary | ICD-10-CM | POA: Diagnosis not present

## 2023-05-14 DIAGNOSIS — Z170 Estrogen receptor positive status [ER+]: Secondary | ICD-10-CM | POA: Diagnosis not present

## 2023-05-14 DIAGNOSIS — D0511 Intraductal carcinoma in situ of right breast: Secondary | ICD-10-CM | POA: Diagnosis not present

## 2023-05-14 LAB — RAD ONC ARIA SESSION SUMMARY
Course Elapsed Days: 6
Plan Fractions Treated to Date: 5
Plan Prescribed Dose Per Fraction: 2.67 Gy
Plan Total Fractions Prescribed: 15
Plan Total Prescribed Dose: 40.05 Gy
Reference Point Dosage Given to Date: 13.35 Gy
Reference Point Session Dosage Given: 2.67 Gy
Session Number: 5

## 2023-05-15 ENCOUNTER — Ambulatory Visit
Admission: RE | Admit: 2023-05-15 | Discharge: 2023-05-15 | Disposition: A | Discharge status: Home / Self Care | Payer: BC Managed Care – PPO | Source: Ambulatory Visit | Attending: Radiation Oncology | Admitting: Radiation Oncology

## 2023-05-15 ENCOUNTER — Other Ambulatory Visit: Payer: Self-pay

## 2023-05-15 ENCOUNTER — Ambulatory Visit: Payer: BC Managed Care – PPO

## 2023-05-15 DIAGNOSIS — D0511 Intraductal carcinoma in situ of right breast: Secondary | ICD-10-CM | POA: Diagnosis not present

## 2023-05-15 DIAGNOSIS — Z510 Encounter for antineoplastic radiation therapy: Secondary | ICD-10-CM | POA: Diagnosis not present

## 2023-05-15 DIAGNOSIS — Z170 Estrogen receptor positive status [ER+]: Secondary | ICD-10-CM | POA: Diagnosis not present

## 2023-05-15 LAB — RAD ONC ARIA SESSION SUMMARY
Course Elapsed Days: 7
Plan Fractions Treated to Date: 6
Plan Prescribed Dose Per Fraction: 2.67 Gy
Plan Total Fractions Prescribed: 15
Plan Total Prescribed Dose: 40.05 Gy
Reference Point Dosage Given to Date: 16.02 Gy
Reference Point Session Dosage Given: 2.67 Gy
Session Number: 6

## 2023-05-16 ENCOUNTER — Ambulatory Visit
Admission: RE | Admit: 2023-05-16 | Discharge: 2023-05-16 | Disposition: A | Discharge status: Home / Self Care | Payer: BC Managed Care – PPO | Source: Ambulatory Visit | Attending: Radiation Oncology | Admitting: Radiation Oncology

## 2023-05-16 ENCOUNTER — Other Ambulatory Visit: Payer: Self-pay

## 2023-05-16 DIAGNOSIS — Z510 Encounter for antineoplastic radiation therapy: Secondary | ICD-10-CM | POA: Diagnosis not present

## 2023-05-16 DIAGNOSIS — D0511 Intraductal carcinoma in situ of right breast: Secondary | ICD-10-CM | POA: Diagnosis not present

## 2023-05-16 DIAGNOSIS — Z170 Estrogen receptor positive status [ER+]: Secondary | ICD-10-CM | POA: Diagnosis not present

## 2023-05-16 LAB — RAD ONC ARIA SESSION SUMMARY
Course Elapsed Days: 8
Plan Fractions Treated to Date: 7
Plan Prescribed Dose Per Fraction: 2.67 Gy
Plan Total Fractions Prescribed: 15
Plan Total Prescribed Dose: 40.05 Gy
Reference Point Dosage Given to Date: 18.69 Gy
Reference Point Session Dosage Given: 2.67 Gy
Session Number: 7

## 2023-05-17 ENCOUNTER — Ambulatory Visit
Admission: RE | Admit: 2023-05-17 | Discharge: 2023-05-17 | Disposition: A | Discharge status: Home / Self Care | Payer: BC Managed Care – PPO | Source: Ambulatory Visit | Attending: Radiation Oncology | Admitting: Radiation Oncology

## 2023-05-17 ENCOUNTER — Other Ambulatory Visit: Payer: Self-pay

## 2023-05-17 DIAGNOSIS — Z170 Estrogen receptor positive status [ER+]: Secondary | ICD-10-CM | POA: Diagnosis not present

## 2023-05-17 DIAGNOSIS — Z510 Encounter for antineoplastic radiation therapy: Secondary | ICD-10-CM | POA: Diagnosis not present

## 2023-05-17 DIAGNOSIS — D0511 Intraductal carcinoma in situ of right breast: Secondary | ICD-10-CM | POA: Diagnosis not present

## 2023-05-17 LAB — RAD ONC ARIA SESSION SUMMARY
Course Elapsed Days: 9
Plan Fractions Treated to Date: 8
Plan Prescribed Dose Per Fraction: 2.67 Gy
Plan Total Fractions Prescribed: 15
Plan Total Prescribed Dose: 40.05 Gy
Reference Point Dosage Given to Date: 21.36 Gy
Reference Point Session Dosage Given: 2.67 Gy
Session Number: 8

## 2023-05-18 ENCOUNTER — Other Ambulatory Visit: Payer: Self-pay

## 2023-05-18 ENCOUNTER — Ambulatory Visit
Admission: RE | Admit: 2023-05-18 | Discharge: 2023-05-18 | Disposition: A | Discharge status: Home / Self Care | Payer: BC Managed Care – PPO | Source: Ambulatory Visit | Attending: Radiation Oncology | Admitting: Radiation Oncology

## 2023-05-18 DIAGNOSIS — Z510 Encounter for antineoplastic radiation therapy: Secondary | ICD-10-CM | POA: Diagnosis not present

## 2023-05-18 DIAGNOSIS — Z170 Estrogen receptor positive status [ER+]: Secondary | ICD-10-CM | POA: Diagnosis not present

## 2023-05-18 DIAGNOSIS — D0511 Intraductal carcinoma in situ of right breast: Secondary | ICD-10-CM | POA: Diagnosis not present

## 2023-05-18 LAB — RAD ONC ARIA SESSION SUMMARY
Course Elapsed Days: 10
Plan Fractions Treated to Date: 9
Plan Prescribed Dose Per Fraction: 2.67 Gy
Plan Total Fractions Prescribed: 15
Plan Total Prescribed Dose: 40.05 Gy
Reference Point Dosage Given to Date: 24.03 Gy
Reference Point Session Dosage Given: 2.67 Gy
Session Number: 9

## 2023-05-21 ENCOUNTER — Other Ambulatory Visit: Payer: Self-pay

## 2023-05-21 ENCOUNTER — Ambulatory Visit
Admission: RE | Admit: 2023-05-21 | Discharge: 2023-05-21 | Disposition: A | Discharge status: Home / Self Care | Payer: BC Managed Care – PPO | Source: Ambulatory Visit | Attending: Radiation Oncology | Admitting: Radiation Oncology

## 2023-05-21 DIAGNOSIS — Z510 Encounter for antineoplastic radiation therapy: Secondary | ICD-10-CM | POA: Diagnosis not present

## 2023-05-21 DIAGNOSIS — D0511 Intraductal carcinoma in situ of right breast: Secondary | ICD-10-CM | POA: Diagnosis not present

## 2023-05-21 DIAGNOSIS — Z170 Estrogen receptor positive status [ER+]: Secondary | ICD-10-CM | POA: Diagnosis not present

## 2023-05-21 LAB — RAD ONC ARIA SESSION SUMMARY
Course Elapsed Days: 13
Plan Fractions Treated to Date: 10
Plan Prescribed Dose Per Fraction: 2.67 Gy
Plan Total Fractions Prescribed: 15
Plan Total Prescribed Dose: 40.05 Gy
Reference Point Dosage Given to Date: 26.7 Gy
Reference Point Session Dosage Given: 2.67 Gy
Session Number: 10

## 2023-05-22 ENCOUNTER — Ambulatory Visit
Admission: RE | Admit: 2023-05-22 | Discharge: 2023-05-22 | Disposition: A | Discharge status: Home / Self Care | Payer: BC Managed Care – PPO | Source: Ambulatory Visit | Attending: Radiation Oncology | Admitting: Radiation Oncology

## 2023-05-22 ENCOUNTER — Other Ambulatory Visit: Payer: Self-pay

## 2023-05-22 DIAGNOSIS — Z170 Estrogen receptor positive status [ER+]: Secondary | ICD-10-CM | POA: Diagnosis not present

## 2023-05-22 DIAGNOSIS — D0511 Intraductal carcinoma in situ of right breast: Secondary | ICD-10-CM | POA: Diagnosis not present

## 2023-05-22 DIAGNOSIS — Z510 Encounter for antineoplastic radiation therapy: Secondary | ICD-10-CM | POA: Diagnosis not present

## 2023-05-22 LAB — RAD ONC ARIA SESSION SUMMARY
Course Elapsed Days: 14
Plan Fractions Treated to Date: 11
Plan Prescribed Dose Per Fraction: 2.67 Gy
Plan Total Fractions Prescribed: 15
Plan Total Prescribed Dose: 40.05 Gy
Reference Point Dosage Given to Date: 29.37 Gy
Reference Point Session Dosage Given: 2.67 Gy
Session Number: 11

## 2023-05-24 ENCOUNTER — Other Ambulatory Visit: Payer: Self-pay

## 2023-05-24 ENCOUNTER — Ambulatory Visit
Admission: RE | Admit: 2023-05-24 | Discharge: 2023-05-24 | Disposition: A | Discharge status: Home / Self Care | Payer: BC Managed Care – PPO | Source: Ambulatory Visit | Attending: Radiation Oncology | Admitting: Radiation Oncology

## 2023-05-24 DIAGNOSIS — Z170 Estrogen receptor positive status [ER+]: Secondary | ICD-10-CM | POA: Diagnosis not present

## 2023-05-24 DIAGNOSIS — Z510 Encounter for antineoplastic radiation therapy: Secondary | ICD-10-CM | POA: Diagnosis not present

## 2023-05-24 DIAGNOSIS — D0511 Intraductal carcinoma in situ of right breast: Secondary | ICD-10-CM | POA: Diagnosis not present

## 2023-05-24 LAB — RAD ONC ARIA SESSION SUMMARY
Course Elapsed Days: 16
Plan Fractions Treated to Date: 12
Plan Prescribed Dose Per Fraction: 2.67 Gy
Plan Total Fractions Prescribed: 15
Plan Total Prescribed Dose: 40.05 Gy
Reference Point Dosage Given to Date: 32.04 Gy
Reference Point Session Dosage Given: 2.67 Gy
Session Number: 12

## 2023-05-25 ENCOUNTER — Other Ambulatory Visit: Payer: Self-pay

## 2023-05-25 ENCOUNTER — Ambulatory Visit
Admission: RE | Admit: 2023-05-25 | Discharge: 2023-05-25 | Disposition: A | Discharge status: Home / Self Care | Payer: BC Managed Care – PPO | Source: Ambulatory Visit | Attending: Radiation Oncology | Admitting: Radiation Oncology

## 2023-05-25 DIAGNOSIS — Z510 Encounter for antineoplastic radiation therapy: Secondary | ICD-10-CM | POA: Diagnosis not present

## 2023-05-25 DIAGNOSIS — D0511 Intraductal carcinoma in situ of right breast: Secondary | ICD-10-CM | POA: Diagnosis not present

## 2023-05-25 DIAGNOSIS — Z170 Estrogen receptor positive status [ER+]: Secondary | ICD-10-CM | POA: Diagnosis not present

## 2023-05-25 LAB — RAD ONC ARIA SESSION SUMMARY
Course Elapsed Days: 17
Plan Fractions Treated to Date: 13
Plan Prescribed Dose Per Fraction: 2.67 Gy
Plan Total Fractions Prescribed: 15
Plan Total Prescribed Dose: 40.05 Gy
Reference Point Dosage Given to Date: 34.71 Gy
Reference Point Session Dosage Given: 2.67 Gy
Session Number: 13

## 2023-05-27 DIAGNOSIS — D0511 Intraductal carcinoma in situ of right breast: Secondary | ICD-10-CM | POA: Diagnosis not present

## 2023-05-27 DIAGNOSIS — Z510 Encounter for antineoplastic radiation therapy: Secondary | ICD-10-CM | POA: Diagnosis not present

## 2023-05-27 DIAGNOSIS — Z170 Estrogen receptor positive status [ER+]: Secondary | ICD-10-CM | POA: Diagnosis not present

## 2023-05-28 ENCOUNTER — Other Ambulatory Visit: Payer: Self-pay

## 2023-05-28 ENCOUNTER — Ambulatory Visit
Admission: RE | Admit: 2023-05-28 | Discharge: 2023-05-28 | Disposition: A | Discharge status: Home / Self Care | Payer: BC Managed Care – PPO | Source: Ambulatory Visit | Attending: Radiation Oncology | Admitting: Radiation Oncology

## 2023-05-28 ENCOUNTER — Other Ambulatory Visit: Payer: Self-pay | Admitting: Radiation Oncology

## 2023-05-28 DIAGNOSIS — Z170 Estrogen receptor positive status [ER+]: Secondary | ICD-10-CM | POA: Diagnosis not present

## 2023-05-28 DIAGNOSIS — Z510 Encounter for antineoplastic radiation therapy: Secondary | ICD-10-CM | POA: Diagnosis not present

## 2023-05-28 DIAGNOSIS — D0511 Intraductal carcinoma in situ of right breast: Secondary | ICD-10-CM | POA: Diagnosis not present

## 2023-05-28 LAB — RAD ONC ARIA SESSION SUMMARY
Course Elapsed Days: 20
Plan Fractions Treated to Date: 14
Plan Prescribed Dose Per Fraction: 2.67 Gy
Plan Total Fractions Prescribed: 15
Plan Total Prescribed Dose: 40.05 Gy
Reference Point Dosage Given to Date: 37.38 Gy
Reference Point Session Dosage Given: 2.67 Gy
Session Number: 14

## 2023-05-28 MED ORDER — TRIAMCINOLONE ACETONIDE 0.5 % EX OINT: 1.0000 | TOPICAL_OINTMENT | Freq: Two times a day (BID) | CUTANEOUS | 0 refills | Status: DC

## 2023-05-29 ENCOUNTER — Ambulatory Visit
Admission: RE | Admit: 2023-05-29 | Discharge: 2023-05-29 | Disposition: A | Discharge status: Home / Self Care | Payer: BC Managed Care – PPO | Source: Ambulatory Visit | Attending: Radiation Oncology | Admitting: Radiation Oncology

## 2023-05-29 ENCOUNTER — Ambulatory Visit: Payer: BC Managed Care – PPO

## 2023-05-29 ENCOUNTER — Other Ambulatory Visit: Payer: Self-pay

## 2023-05-29 ENCOUNTER — Ambulatory Visit: Payer: BC Managed Care – PPO | Admitting: Radiation Oncology

## 2023-05-29 DIAGNOSIS — Z170 Estrogen receptor positive status [ER+]: Secondary | ICD-10-CM | POA: Diagnosis not present

## 2023-05-29 DIAGNOSIS — D0511 Intraductal carcinoma in situ of right breast: Secondary | ICD-10-CM | POA: Diagnosis not present

## 2023-05-29 DIAGNOSIS — Z510 Encounter for antineoplastic radiation therapy: Secondary | ICD-10-CM | POA: Diagnosis not present

## 2023-05-29 LAB — RAD ONC ARIA SESSION SUMMARY
Course Elapsed Days: 21
Plan Fractions Treated to Date: 15
Plan Prescribed Dose Per Fraction: 2.67 Gy
Plan Total Fractions Prescribed: 15
Plan Total Prescribed Dose: 40.05 Gy
Reference Point Dosage Given to Date: 40.05 Gy
Reference Point Session Dosage Given: 2.67 Gy
Session Number: 15

## 2023-05-31 ENCOUNTER — Other Ambulatory Visit: Payer: Self-pay

## 2023-05-31 ENCOUNTER — Ambulatory Visit
Admission: RE | Admit: 2023-05-31 | Discharge: 2023-05-31 | Disposition: A | Discharge status: Home / Self Care | Payer: BC Managed Care – PPO | Source: Ambulatory Visit | Attending: Radiation Oncology | Admitting: Radiation Oncology

## 2023-05-31 DIAGNOSIS — D0511 Intraductal carcinoma in situ of right breast: Secondary | ICD-10-CM | POA: Diagnosis not present

## 2023-05-31 DIAGNOSIS — Z170 Estrogen receptor positive status [ER+]: Secondary | ICD-10-CM | POA: Diagnosis not present

## 2023-05-31 DIAGNOSIS — Z510 Encounter for antineoplastic radiation therapy: Secondary | ICD-10-CM | POA: Diagnosis not present

## 2023-05-31 LAB — RAD ONC ARIA SESSION SUMMARY
Course Elapsed Days: 23
Plan Fractions Treated to Date: 1
Plan Prescribed Dose Per Fraction: 2 Gy
Plan Total Fractions Prescribed: 5
Plan Total Prescribed Dose: 10 Gy
Reference Point Dosage Given to Date: 2 Gy
Reference Point Session Dosage Given: 2 Gy
Session Number: 16

## 2023-06-01 ENCOUNTER — Ambulatory Visit
Admission: RE | Admit: 2023-06-01 | Discharge: 2023-06-01 | Disposition: A | Discharge status: Home / Self Care | Payer: BC Managed Care – PPO | Source: Ambulatory Visit | Attending: Radiation Oncology | Admitting: Radiation Oncology

## 2023-06-01 ENCOUNTER — Other Ambulatory Visit: Payer: Self-pay

## 2023-06-01 DIAGNOSIS — D0511 Intraductal carcinoma in situ of right breast: Secondary | ICD-10-CM | POA: Diagnosis not present

## 2023-06-01 LAB — RAD ONC ARIA SESSION SUMMARY
Course Elapsed Days: 24
Plan Fractions Treated to Date: 2
Plan Prescribed Dose Per Fraction: 2 Gy
Plan Total Fractions Prescribed: 5
Plan Total Prescribed Dose: 10 Gy
Reference Point Dosage Given to Date: 4 Gy
Reference Point Session Dosage Given: 2 Gy
Session Number: 17

## 2023-06-04 ENCOUNTER — Other Ambulatory Visit: Payer: Self-pay

## 2023-06-04 ENCOUNTER — Ambulatory Visit
Admission: RE | Admit: 2023-06-04 | Discharge: 2023-06-04 | Disposition: A | Discharge status: Home / Self Care | Payer: BC Managed Care – PPO | Source: Ambulatory Visit | Attending: Radiation Oncology | Admitting: Radiation Oncology

## 2023-06-04 DIAGNOSIS — D0511 Intraductal carcinoma in situ of right breast: Secondary | ICD-10-CM | POA: Diagnosis not present

## 2023-06-04 LAB — RAD ONC ARIA SESSION SUMMARY
Course Elapsed Days: 27
Plan Fractions Treated to Date: 3
Plan Prescribed Dose Per Fraction: 2 Gy
Plan Total Fractions Prescribed: 5
Plan Total Prescribed Dose: 10 Gy
Reference Point Dosage Given to Date: 6 Gy
Reference Point Session Dosage Given: 2 Gy
Session Number: 18

## 2023-06-05 ENCOUNTER — Ambulatory Visit: Payer: BC Managed Care – PPO

## 2023-06-05 ENCOUNTER — Other Ambulatory Visit: Payer: Self-pay | Admitting: Radiation Oncology

## 2023-06-05 ENCOUNTER — Ambulatory Visit
Admission: RE | Admit: 2023-06-05 | Discharge: 2023-06-05 | Disposition: A | Discharge status: Home / Self Care | Payer: BC Managed Care – PPO | Source: Ambulatory Visit | Attending: Radiation Oncology | Admitting: Radiation Oncology

## 2023-06-05 ENCOUNTER — Other Ambulatory Visit: Payer: Self-pay

## 2023-06-05 DIAGNOSIS — D0511 Intraductal carcinoma in situ of right breast: Secondary | ICD-10-CM | POA: Diagnosis not present

## 2023-06-05 LAB — RAD ONC ARIA SESSION SUMMARY
Course Elapsed Days: 28
Plan Fractions Treated to Date: 4
Plan Prescribed Dose Per Fraction: 2 Gy
Plan Total Fractions Prescribed: 5
Plan Total Prescribed Dose: 10 Gy
Reference Point Dosage Given to Date: 8 Gy
Reference Point Session Dosage Given: 2 Gy
Session Number: 19

## 2023-06-05 MED ORDER — TRIAMCINOLONE ACETONIDE 0.5 % EX OINT: 1.0000 | TOPICAL_OINTMENT | Freq: Two times a day (BID) | CUTANEOUS | 2 refills | Status: AC

## 2023-06-05 MED ORDER — RADIAPLEXRX EX GEL: Freq: Once | CUTANEOUS | Status: AC

## 2023-06-05 MED ORDER — GABAPENTIN 300 MG PO CAPS: 300.0000 mg | ORAL_CAPSULE | Freq: Every day | ORAL | 1 refills | Status: AC

## 2023-06-06 ENCOUNTER — Inpatient Hospital Stay (HOSPITAL_BASED_OUTPATIENT_CLINIC_OR_DEPARTMENT_OTHER): Payer: BC Managed Care – PPO | Attending: Hematology and Oncology | Admitting: Hematology and Oncology

## 2023-06-06 ENCOUNTER — Other Ambulatory Visit: Payer: Self-pay

## 2023-06-06 ENCOUNTER — Ambulatory Visit
Admission: RE | Admit: 2023-06-06 | Discharge: 2023-06-06 | Disposition: A | Discharge status: Home / Self Care | Payer: BC Managed Care – PPO | Source: Ambulatory Visit | Attending: Radiation Oncology | Admitting: Radiation Oncology

## 2023-06-06 VITALS — BP 125/51 | HR 69 | Temp 97.3°F | Resp 18 | Ht 60.0 in | Wt 152.2 lb

## 2023-06-06 DIAGNOSIS — D0511 Intraductal carcinoma in situ of right breast: Secondary | ICD-10-CM | POA: Insufficient documentation

## 2023-06-06 DIAGNOSIS — Z510 Encounter for antineoplastic radiation therapy: Secondary | ICD-10-CM | POA: Diagnosis not present

## 2023-06-06 DIAGNOSIS — Z923 Personal history of irradiation: Secondary | ICD-10-CM | POA: Insufficient documentation

## 2023-06-06 DIAGNOSIS — Z79810 Long term (current) use of selective estrogen receptor modulators (SERMs): Secondary | ICD-10-CM | POA: Insufficient documentation

## 2023-06-06 DIAGNOSIS — Z170 Estrogen receptor positive status [ER+]: Secondary | ICD-10-CM | POA: Diagnosis not present

## 2023-06-06 LAB — RAD ONC ARIA SESSION SUMMARY
Course Elapsed Days: 29
Plan Fractions Treated to Date: 5
Plan Prescribed Dose Per Fraction: 2 Gy
Plan Total Fractions Prescribed: 5
Plan Total Prescribed Dose: 10 Gy
Reference Point Dosage Given to Date: 10 Gy
Reference Point Session Dosage Given: 2 Gy
Session Number: 20

## 2023-06-06 MED ORDER — TAMOXIFEN CITRATE 10 MG PO TABS: 5.0000 mg | ORAL_TABLET | Freq: Every day | ORAL | 1 refills | Status: AC

## 2023-06-06 NOTE — Assessment & Plan Note (Signed)
 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 These include but not limited to insomnia, hot flashes, mood changes, vaginal dryness, and weight gain. Although rare, serious side effects including endometrial cancer, risk of blood clots were also discussed. We strongly believe that the benefits far outweigh the risks. Patient understands these risks and consented to starting treatment. Planned treatment duration is 5 years.  Return to clinic in 3 months for survivorship care plan visit

## 2023-06-06 NOTE — Progress Notes (Signed)
 Patient Care Team: Plotnikov, [name], MD as PCP - General Anner Alm ORN, MD as PCP - Cardiology (Cardiology) Rosalynn ORN Ingle, MD (Inactive) as Attending Physician (Obstetrics and Gynecology) Arlana Arnt, MD as Attending Physician (Otolaryngology) Mat Browning, MD as Consulting Physician (Obstetrics and Gynecology) Anner Alm ORN, MD as Consulting Physician (Cardiology) Glean Stephane BROCKS, RN as Oncology Nurse Navigator Tyree Nanetta SAILOR, RN as Oncology Nurse Navigator (Hematology and Oncology) Odean Potts, MD as Consulting Physician (Hematology and Oncology)  DIAGNOSIS:  Encounter Diagnosis  Name Primary?   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 is here for follow up after completing radiation. She reports severe skin reactions to the radiation, described as burns and crawling sensations, particularly at night. These symptoms have been managed with steroid creams and gabapentin , with some relief. The patient also reports significant fatigue, likely due to the combination of radiation side effects and long work hours.  In addition to her cancer treatment, the patient has a history of anxiety and migraines, for which she is currently taking Effexor  and Prozac . She expresses concern about potential interactions between these medications and the proposed tamoxifen  therapy. The patient also inquires about the use of CBD gummies for sleep and relaxation, as well as the application of essential  oils for healing post-radiation.         ALLERGIES:  is allergic to bee pollen, butorphanol, butorphanol tartrate, codeine, codeine sulfate, crestor [rosuvastatin], darvocet [propoxyphene n-acetaminophen ], estrogens, levofloxacin, metformin  and related, penicillamine, penicillins, sulfa antibiotics, and latex.  MEDICATIONS:  Current Outpatient Medications  Medication Sig Dispense Refill   tamoxifen  (NOLVADEX ) 10 MG tablet Take 0.5 tablets (5 mg total) by mouth daily. 90 tablet 1   albuterol  (VENTOLIN  HFA) 108 (90 Base) MCG/ACT inhaler TAKE 2 PUFFS BY MOUTH EVERY 6 HOURS AS NEEDED FOR WHEEZE OR SHORTNESS OF BREATH 54 each 3   ALPRAZolam  (XANAX ) 0.5 MG tablet Take 1 tablet (0.5 mg total) by mouth 2 (two) times daily as needed for anxiety. 60 tablet 3   amphetamine -dextroamphetamine  (ADDERALL) 10 MG tablet Take 1 tablet (10 mg total) by mouth 2 (two) times daily. (Patient taking differently: Take 10 mg by mouth 2 (two) times daily. Takes 1/2 tab daily) 60 tablet 0   aspirin  EC 81 MG tablet Take 81 mg by mouth every evening.     cetirizine (ZYRTEC) 10 MG tablet Take 10 mg by mouth daily.     Cholecalciferol (VITAMIN D3) 50 MCG (2000 UT) capsule Take 1 capsule (2,000 Units total) by mouth daily. 100 capsule 3   cyanocobalamin  (VITAMIN B12) 1000 MCG/ML injection INJECT 1 ML INTO MUSCLE OR SUBCUTANEOUSLY EVERY 2 WEEKS (LIFETIME) 6 mL 3   FLUoxetine  (PROZAC ) 40 MG capsule TAKE 1 CAPSULE BY MOUTH EVERY DAY 90 capsule 1   fluticasone  (FLONASE ) 50 MCG/ACT nasal spray SPRAY 2 SPRAYS INTO EACH NOSTRIL EVERY DAY 48 mL 3   gabapentin  (NEURONTIN ) 100 MG capsule Take 1-2 capsules (100-200 mg total) by mouth at bedtime. (Patient taking differently: Take 100-200 mg by  mouth daily as needed.) 180 capsule 1   gabapentin  (NEURONTIN ) 300 MG capsule Take 1 capsule (300 mg total) by mouth at bedtime. 30 capsule 1   JARDIANCE  10 MG TABS tablet TAKE 1 TABLET BY MOUTH EVERY DAY 90 tablet 1   losartan  (COZAAR ) 50 MG  tablet TAKE 2 TABLETS (100MG ) BY MOUTH EVERY DAY 180 tablet 1   Multiple Vitamins-Minerals (PRESERVISION AREDS 2) CAPS Take 1 capsule by mouth at bedtime.     Omega-3 Fatty Acids (FISH OIL) 1000 MG CAPS Take 1,000 mg by mouth daily.     ondansetron  (ZOFRAN ) 4 MG tablet Take 1 tablet (4 mg total) by mouth every 8 (eight) hours as needed for nausea or vomiting. 20 tablet 1   pantoprazole  (PROTONIX ) 40 MG tablet Take 1 tablet (40 mg total) by mouth daily. 90 tablet 3   Probiotic Product (PROBIOTIC ADVANCED PO) Take by mouth daily.     Semaglutide , 1 MG/DOSE, 4 MG/3ML SOPN Inject 1 mg as directed once a week. 9 mL 1   SYRINGE-NEEDLE, DISP, 3 ML (BD ECLIPSE SYRINGE) 25G X 1 3 ML MISC Use sq for B12 inj 50 each 3   topiramate  (TOPAMAX ) 200 MG tablet TAKE 1/2-1 TABLETS BY MOUTH DAILY 60 tablet 2   triamcinolone  ointment (KENALOG ) 0.5 % Apply 1 Application topically 2 (two) times daily. 30 g 2   venlafaxine  XR (EFFEXOR -XR) 37.5 MG 24 hr capsule TAKE 1 CAPSULE BY MOUTH DAILY WITH BREAKFAST. 90 capsule 2   vitamin C (ASCORBIC ACID) 500 MG tablet Take 500 mg by mouth daily.     vitamin E 1000 UNIT capsule Take 1,000 Units by mouth daily.     Zinc 50 MG CAPS Take by mouth.     No current facility-administered medications for this visit.    PHYSICAL EXAMINATION: ECOG PERFORMANCE STATUS: 1 - Symptomatic but completely ambulatory  Vitals:   06/06/23 0825  BP: (!) 125/51  Pulse: 69  Resp: 18  Temp: (!) 97.3 F (36.3 C)  SpO2: 100%   Filed Weights   06/06/23 0825  Weight: 152 lb 3.2 oz (69 kg)    Physical Exam          (exam performed in the presence of a chaperone)  LABORATORY DATA:  I have reviewed the data as listed    Latest Ref Rng & Units 03/27/2023   10:33 AM 11/03/2022    8:35 AM 06/05/2022   10:52 AM  CMP  Glucose 70 - 99 mg/dL 890  92  885   BUN 6 - 20 mg/dL 14  26  20    Creatinine 0.44 - 1.00 mg/dL 9.33  9.22  9.29   Sodium 135 - 145 mmol/L 139  140  140   Potassium 3.5 -  5.1 mmol/L 4.4  4.9  4.2   Chloride 98 - 111 mmol/L 109  107  110   CO2 22 - 32 mmol/L 25  27  23    Calcium 8.9 - 10.3 mg/dL 8.8  9.0  9.2   Total Protein 6.0 - 8.3 g/dL  7.1  7.2   Total Bilirubin 0.2 - 1.2 mg/dL  0.4  0.3   Alkaline Phos 39 - 117 U/L  70  77   AST 0 - 37 U/L  18  17   ALT 0 - 35 U/L  17  18     Lab Results  Component Value Date   WBC 8.7 05/24/2021   HGB 14.6 05/24/2021   HCT 43.5  05/24/2021   MCV 89.8 05/24/2021   PLT 193.0 05/24/2021   NEUTROABS 5.8 05/24/2021    ASSESSMENT & PLAN:  Ductal carcinoma in situ of right breast 03/28/2023: Right lumpectomy: 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 These include but not limited to insomnia, hot flashes, mood changes, vaginal dryness, and weight gain. Although rare, serious side effects including endometrial cancer, risk of blood clots were also discussed. We strongly believe that the benefits far outweigh the risks. Patient understands these risks and consented to starting treatment. Planned treatment duration is 5 years.  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 Managed with steroid cream and gabapentin  for nerve pain. -Continue gabapentin  and steroid cream as prescribed. -Consider use of CBD gummies for relaxation and sleep aid.  Breast Cancer Completed radiation therapy. Discussed the initiation of tamoxifen  for hormone receptor-positive breast cancer. Discussed potential side effects and benefits of tamoxifen ,  including hot flashes, mood swings, muscle cramps, vaginal dryness, and potential for blood clots. Also discussed potential benefits including prevention of osteoporosis and decrease in cholesterol. -Start low-dose tamoxifen  (5mg  daily) in two weeks after recovery from radiation dermatitis. -Consider discontinuation of Prozac  due to potential interaction with tamoxifen , reducing its efficacy. Continue Effexor .  Survivorship Care Plan Discussed the importance of a survivorship care plan post-treatment, including regular mammograms, diet, and supplements. -Schedule a three-month survivorship care plan visit with the nurse practitioner. -Plan for annual follow-up visits for the next five years.          No orders of the defined types were placed in this encounter.  The patient has a good understanding of the overall plan. she agrees with it. she will call with any problems that may develop before the next visit here. Total time spent: 30 mins including face to face time and time spent for planning, charting and co-ordination of care   Naomi MARLA Chad, MD 06/06/23

## 2023-06-07 NOTE — Radiation Completion Notes (Addendum)
  Radiation Oncology         [phone] ________________________________  Name: Amy Lang MRN: [id number]  Date of Service: 06/06/2023  DOB: 04/15/1966  End of Treatment Note  Diagnosis: High Grade, ER positive DCIS of the right breast.   Intent: Curative     ==========DELIVERED PLANS==========  First Treatment Date: 2023-05-08 Last Treatment Date: 2023-06-06   Plan Name: Breast_R Site: Breast, Right Technique: 3D Mode: Photon Dose Per Fraction: 2.67 Gy Prescribed Dose (Delivered / Prescribed): 40.05 Gy / 40.05 Gy Prescribed Fxs (Delivered / Prescribed): 15 / 15   Plan Name: Breast_R_Bst Site: Breast, Right Technique: 3D Mode: Photon Dose Per Fraction: 2 Gy Prescribed Dose (Delivered / Prescribed): 10 Gy / 10 Gy Prescribed Fxs (Delivered / Prescribed): 5 / 5     ==========ON TREATMENT VISIT DATES========== 2023-05-09, 2023-05-15, 2023-05-28, 2023-06-05   See weekly On Treatment Notes in Epic for details in the Media tab (listed as Progress notes on the On Treatment Visit Dates listed above). The patient tolerated radiation. She developed fatigue and anticipated skin changes in the treatment field. She noted pain and itching toward the conclusion of therapy treated with topical steroid cream and oral pain medication.  The patient will receive a call in about one month from the radiation oncology department. She will continue follow up with Dr. Gudena as well.      Donald KYM Husband, PAC

## 2023-06-14 DIAGNOSIS — Z85828 Personal history of other malignant neoplasm of skin: Secondary | ICD-10-CM | POA: Diagnosis not present

## 2023-06-14 DIAGNOSIS — L821 Other seborrheic keratosis: Secondary | ICD-10-CM | POA: Diagnosis not present

## 2023-06-14 DIAGNOSIS — D2261 Melanocytic nevi of right upper limb, including shoulder: Secondary | ICD-10-CM | POA: Diagnosis not present

## 2023-06-14 DIAGNOSIS — D225 Melanocytic nevi of trunk: Secondary | ICD-10-CM | POA: Diagnosis not present

## 2023-06-14 DIAGNOSIS — L570 Actinic keratosis: Secondary | ICD-10-CM | POA: Diagnosis not present

## 2023-06-29 ENCOUNTER — Telehealth: Payer: Self-pay | Admitting: *Deleted

## 2023-06-29 NOTE — Telephone Encounter (Signed)
CALLED PATIENT TO ASK ABOUT RESCHEDULING FU APPT. ON 07-09-23 DUE TO DR. KINARD BEING IN THE OR, RESCHEDULED FOR 07-23-23 @ 10:30 AM, LVM FOR A RETURN CALL

## 2023-07-03 ENCOUNTER — Telehealth: Payer: Self-pay | Admitting: *Deleted

## 2023-07-03 NOTE — Telephone Encounter (Signed)
 RETURNED PATIENT'S PHONE CALL, SPOKE WITH PATIENT. ?

## 2023-07-09 ENCOUNTER — Ambulatory Visit: Payer: Self-pay | Admitting: Radiation Oncology

## 2023-07-23 ENCOUNTER — Ambulatory Visit: Payer: BC Managed Care – PPO | Admitting: Radiation Oncology

## 2023-08-10 ENCOUNTER — Encounter: Payer: Self-pay | Admitting: Radiation Oncology

## 2023-08-12 NOTE — Progress Notes (Signed)
 Radiation Oncology         [phone] ________________________________  Name: Amy Lang MRN: [id number]  Date: 08/13/2023  DOB: 03/30/1966  Follow-Up Visit Note  CC: Plotnikov, Georgina Quint, MD  Emelia Loron, MD  No diagnosis found.  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: s/p right lumpectomy without nodal biopsies   Interval Since Last Radiation: 2 months and 9 days   Radiation Treatment Dates: First Treatment Date: 2023-05-08 -- Last Treatment Date: 2023-06-06  Site/Dose/Technique/Mode:   Site: Breast, Right Technique: 3D Mode: Photon Dose Per Fraction: 2.67 Gy Prescribed Dose (Delivered / Prescribed): 40.05 Gy / 40.05 Gy Prescribed Fxs (Delivered / Prescribed): 15 / 15   Site: Breast, Right - Boost  Technique: 3D Mode: Photon Dose Per Fraction: 2 Gy Prescribed Dose (Delivered / Prescribed): 10 Gy / 10 Gy Prescribed Fxs (Delivered / Prescribed): 5 / 5  Narrative:  The patient returns today for her first routine follow-up visit after completing radiation therapy. Radiation side effects encountered by the patient included fatigue, anticipated skin changes in the treatment field, and pain and itching toward the conclusion of therapy which was treated with topical steroid cream and oral pain medication.   She most recently followed up with Dr. Pamelia Hoit on 06/06/23 which was also the date of her final radiation treatment. During that visit, the patient was noted to report severe skin reactions from radiation therapy including burning sensations to the skin and significant fatigue. In terms of antiestrogen treatment, the patient consented to try low-dose tamoxifen (5mg ) at that time. She was also advised to discontinue her prozac to avoid any potential interactions with tamoxifen. (To allow her more recovery time from her radiation treatment side effects, Dr. Pamelia Hoit recommended that she  wait two weeks to initiate tamoxifen from the time of her appointment).   ***                 Allergies:  is allergic to bee pollen, butorphanol, butorphanol tartrate, codeine, codeine sulfate, crestor [rosuvastatin], darvocet [propoxyphene n-acetaminophen], estrogens, levofloxacin, metformin and related, penicillamine, penicillins, sulfa antibiotics, and latex.  Meds: Current Outpatient Medications  Medication Sig Dispense Refill   albuterol (VENTOLIN HFA) 108 (90 Base) MCG/ACT inhaler TAKE 2 PUFFS BY MOUTH EVERY 6 HOURS AS NEEDED FOR WHEEZE OR SHORTNESS OF BREATH 54 each 3   ALPRAZolam (XANAX) 0.5 MG tablet Take 1 tablet (0.5 mg total) by mouth 2 (two) times daily as needed for anxiety. 60 tablet 3   amphetamine-dextroamphetamine (ADDERALL) 10 MG tablet Take 1 tablet (10 mg total) by mouth 2 (two) times daily. (Patient taking differently: Take 10 mg by mouth 2 (two) times daily. Takes 1/2 tab daily) 60 tablet 0   aspirin EC 81 MG tablet Take 81 mg by mouth every evening.     cetirizine (ZYRTEC) 10 MG tablet Take 10 mg by mouth daily.     Cholecalciferol (VITAMIN D3) 50 MCG (2000 UT) capsule Take 1 capsule (2,000 Units total) by mouth daily. 100 capsule 3   cyanocobalamin (VITAMIN B12) 1000 MCG/ML injection INJECT 1 ML INTO MUSCLE OR SUBCUTANEOUSLY EVERY 2 WEEKS (LIFETIME) 6 mL 3   FLUoxetine (PROZAC) 40 MG capsule TAKE 1 CAPSULE BY MOUTH EVERY DAY 90 capsule 1   fluticasone (FLONASE) 50 MCG/ACT nasal spray SPRAY 2 SPRAYS INTO EACH NOSTRIL EVERY DAY 48 mL 3   gabapentin (NEURONTIN) 100 MG capsule Take 1-2 capsules (100-200  mg total) by mouth at bedtime. (Patient taking differently: Take 100-200 mg by mouth daily as needed.) 180 capsule 1   gabapentin (NEURONTIN) 300 MG capsule Take 1 capsule (300 mg total) by mouth at bedtime. 30 capsule 1   JARDIANCE 10 MG TABS tablet TAKE 1 TABLET BY MOUTH EVERY DAY 90 tablet 1   losartan (COZAAR) 50 MG tablet TAKE 2 TABLETS (100MG ) BY MOUTH EVERY DAY 180  tablet 1   Multiple Vitamins-Minerals (PRESERVISION AREDS 2) CAPS Take 1 capsule by mouth at bedtime.     Omega-3 Fatty Acids (FISH OIL) 1000 MG CAPS Take 1,000 mg by mouth daily.     ondansetron (ZOFRAN) 4 MG tablet Take 1 tablet (4 mg total) by mouth every 8 (eight) hours as needed for nausea or vomiting. 20 tablet 1   pantoprazole (PROTONIX) 40 MG tablet Take 1 tablet (40 mg total) by mouth daily. 90 tablet 3   Probiotic Product (PROBIOTIC ADVANCED PO) Take by mouth daily.     Semaglutide, 1 MG/DOSE, 4 MG/3ML SOPN Inject 1 mg as directed once a week. 9 mL 1   SYRINGE-NEEDLE, DISP, 3 ML (BD ECLIPSE SYRINGE) 25G X 1" 3 ML MISC Use sq for B12 inj 50 each 3   tamoxifen (NOLVADEX) 10 MG tablet Take 0.5 tablets (5 mg total) by mouth daily. 90 tablet 1   topiramate (TOPAMAX) 200 MG tablet TAKE 1/2-1 TABLETS BY MOUTH DAILY 60 tablet 2   triamcinolone ointment (KENALOG) 0.5 % Apply 1 Application topically 2 (two) times daily. 30 g 2   venlafaxine XR (EFFEXOR-XR) 37.5 MG 24 hr capsule TAKE 1 CAPSULE BY MOUTH DAILY WITH BREAKFAST. 90 capsule 2   vitamin C (ASCORBIC ACID) 500 MG tablet Take 500 mg by mouth daily.     vitamin E 1000 UNIT capsule Take 1,000 Units by mouth daily.     Zinc 50 MG CAPS Take by mouth.     No current facility-administered medications for this encounter.    Physical Findings: The patient is in no acute distress. Patient is alert and oriented.  vitals were not taken for this visit. .  No significant changes. Lungs are clear to auscultation bilaterally. Heart has regular rate and rhythm. No palpable cervical, supraclavicular, or axillary adenopathy. Abdomen soft, non-tender, normal bowel sounds.  Left Breast: no palpable mass, nipple discharge or bleeding. Right Breast: *** no palpable mass, nipple discharge or bleeding.   Lab Findings: Lab Results  Component Value Date   WBC 8.7 05/24/2021   HGB 14.6 05/24/2021   HCT 43.5 05/24/2021   MCV 89.8 05/24/2021   PLT 193.0  05/24/2021    Radiographic Findings: No results found.  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  ***  Plan:  ***   *** minutes of total time was spent for this patient encounter, including preparation, face-to-face counseling with the patient and coordination of care, physical exam, and documentation of the encounter. ____________________________________  Billie Lade, PhD, MD  This document serves as a record of services personally performed by Antony Blackbird, MD. It was created on his behalf by Neena Rhymes, a trained medical scribe. The creation of this record is based on the scribe's personal observations and the provider's statements to them. This document has been checked and approved by the attending provider.

## 2023-08-13 ENCOUNTER — Ambulatory Visit
Admission: RE | Admit: 2023-08-13 | Discharge: 2023-08-13 | Disposition: A | Discharge status: Home / Self Care | Payer: BC Managed Care – PPO | Source: Ambulatory Visit | Attending: Radiation Oncology | Admitting: Radiation Oncology

## 2023-08-13 ENCOUNTER — Encounter: Payer: Self-pay | Admitting: Radiation Oncology

## 2023-08-13 VITALS — BP 114/52 | HR 81 | Temp 97.5°F | Resp 18 | Ht 60.0 in | Wt 153.4 lb

## 2023-08-13 DIAGNOSIS — Z79899 Other long term (current) drug therapy: Secondary | ICD-10-CM | POA: Diagnosis not present

## 2023-08-13 DIAGNOSIS — Z170 Estrogen receptor positive status [ER+]: Secondary | ICD-10-CM | POA: Insufficient documentation

## 2023-08-13 DIAGNOSIS — D0511 Intraductal carcinoma in situ of right breast: Secondary | ICD-10-CM | POA: Insufficient documentation

## 2023-08-13 DIAGNOSIS — Z79810 Long term (current) use of selective estrogen receptor modulators (SERMs): Secondary | ICD-10-CM | POA: Diagnosis not present

## 2023-08-13 DIAGNOSIS — Z923 Personal history of irradiation: Secondary | ICD-10-CM | POA: Diagnosis not present

## 2023-08-13 DIAGNOSIS — M25511 Pain in right shoulder: Secondary | ICD-10-CM | POA: Insufficient documentation

## 2023-08-13 DIAGNOSIS — Z7982 Long term (current) use of aspirin: Secondary | ICD-10-CM | POA: Insufficient documentation

## 2023-08-13 HISTORY — DX: Personal history of irradiation: Z92.3

## 2023-08-13 NOTE — Progress Notes (Signed)
 Amy Lang is here today for follow up post radiation to the breast.   Breast Side:Right   They completed their radiation on: 06/06/23   Does the patient complain of any of the following: Post radiation skin issues: No Breast Tenderness: Yes Breast Swelling: No Lymphadema: No Range of Motion limitations: Reports pulling to right axilla when lifting arm.  Fatigue post radiation: Yes Appetite good/fair/poor: Good  Additional comments if applicable: Patient reports upper right side back pain and deep shooting pains to right breast.   BP (!) 114/52 (BP Location: Left Arm, Patient Position: Sitting)   Pulse 81   Temp (!) 97.5 F (36.4 C) (Temporal)   Resp 18   Ht 5' (1.524 m)   Wt 153 lb 6 oz (69.6 kg)   SpO2 99%   BMI 29.95 kg/m

## 2023-08-20 ENCOUNTER — Other Ambulatory Visit: Payer: Self-pay | Admitting: Internal Medicine

## 2023-08-26 NOTE — Therapy (Signed)
 OUTPATIENT PHYSICAL THERAPY  UPPER EXTREMITY ONCOLOGY EVALUATION  Patient Name: Amy Lang MRN: [id number] DOB:1966/01/11, 58 y.o., female Today's Date: 08/27/2023  END OF SESSION:  PT End of Session - 08/27/23 1451     Visit Number 1    Number of Visits 7    Date for PT Re-Evaluation 10/08/23    Authorization Type none needed    PT Start Time 1400    PT Stop Time 1449    PT Time Calculation (min) 49 min    Activity Tolerance Patient tolerated treatment well    Behavior During Therapy [facility] for tasks assessed/performed             Past Medical History:  Diagnosis Date   Allergy    Anxiety    Asthma    Depression    Diabetes (HCC)    Dyslipidemia    History of radiation therapy    Right breast-05/08/23-06/06/23- Dr. Antony Blackbird   Hypertension    IBS (irritable bowel syndrome)    Lactose intolerance    Migraines    Obesity    PONV (postoperative nausea and vomiting)    Shortness of breath    Vaginal cancer (HCC)    Vitamin B 12 deficiency    Vitamin D deficiency    Past Surgical History:  Procedure Laterality Date   ABDOMINAL HYSTERECTOMY     still has bilateral ovaries   BREAST BIOPSY Right 03/02/2023   MM RT BREAST BX W LOC DEV 1ST LESION IMAGE BX SPEC STEREO GUIDE 03/02/2023 [facility]   BREAST BIOPSY  03/27/2023   MM RT RADIOACTIVE SEED LOC MAMMO GUIDE 03/27/2023 [facility]   BREAST EXCISIONAL BIOPSY Right 08/27/2017   BREAST LUMPECTOMY WITH RADIOACTIVE SEED LOCALIZATION Right 08/28/2017   Procedure: BREAST LUMPECTOMY WITH RADIOACTIVE SEED LOCALIZATION;  Surgeon: Harriette Bouillon, MD;  Location: [facility];  Service: General;  Laterality: Right;   BREAST LUMPECTOMY WITH RADIOACTIVE SEED LOCALIZATION Right 03/28/2023   Procedure: RIGHT BREAST SEED GUIDED LUMPECTOMY;  Surgeon: Emelia Loron, MD;  Location: [facility];  Service: General;  Laterality: Right;  LMA   CHOLECYSTECTOMY  04-04-11   single site    COLONOSCOPY W/ BIOPSIES  01/24/2013   ESOPHAGOGASTRODUODENOSCOPY     KNEE SURGERY     LAPAROTOMY  05/12/2012   Procedure: LAPAROTOMY;  Surgeon: Jeani Hawking, MD;  Location: [facility];  Service: Gynecology;;  laparotomy Maura Crandall salpingectomy-oopharectomy   NM MYOCAR PERF WALL MOTION  09/06/2009   normal   TRANSTHORACIC ECHOCARDIOGRAM  March 2014   Normal regional wall motion. EF 55%. Mild MR   Patient Active Problem List   Diagnosis Date Noted   Ductal carcinoma in situ of right breast 03/19/2023   Fluttering sensation of heart 06/14/2022   GERD (gastroesophageal reflux disease) 12/26/2021   COVID-19 06/07/2021   Acute bronchitis 06/07/2021   Chest pain, atypical 05/24/2021   Flank pain 05/24/2021   Sprain of left ankle 05/02/2021   Peroneal tendinitis 04/01/2021   Plantar fasciitis of left foot 04/01/2021   Insomnia disorder 06/23/2019   Sebaceous cyst 06/23/2019   Prediabetes 03/24/2019   Acute sinusitis 03/18/2018   CFS (chronic fatigue syndrome) 02/06/2018   Exercise-induced asthma 02/06/2018   Type 2 diabetes mellitus without complication, without long-term current use of insulin (HCC) 02/06/2018   Vitamin D deficiency 02/06/2018   Metabolic syndrome 12/30/2017   Bilateral carpal tunnel syndrome 12/03/2017   Well adult exam 09/11/2016   Adenopathy 01/13/2015   Trigeminal  neuralgia of left side of face 12/07/2014   Abdominal pain, epigastric 05/14/2014   Nausea with vomiting 05/14/2014   Eye exam abnormal, was told she had bled behind eye 08/22/2013   Obesity (BMI 30-39.9) 02/17/2013   Dizziness 02/17/2013   Family history of coronary artery disease 02/17/2013   IBS (irritable bowel syndrome)    Essential hypertension    Hyperlipidemia    Diarrhea - suspect IBS 12/23/2012   Hematochezia 12/23/2012   B12 deficiency 04/15/2012   Hot flashes 04/15/2012   ALLERGIC RHINITIS 12/02/2009   Asthma 12/02/2009   Anxiety disorder 06/30/2009   Depression 06/30/2009    LIPOMAS, MULTIPLE 06/29/2009   GRIEF REACTION 06/29/2009   Migraine 06/29/2009   DYSPNEA 06/29/2009    PCP: Loura Pardon, MD  REFERRING PROVIDER: Bryan Lemma, PA-C  REFERRING DIAG: DiagnosisD05.11 (ICD-10-CM) - Ductal carcinoma in situ of right breast  THERAPY DIAG:  Ductal carcinoma in situ of right breast  Lymphedema, not elsewhere classified  Acute pain of right shoulder  ONSET DATE: 03/28/23  Rationale for Evaluation and Treatment: Rehabilitation  SUBJECTIVE:                                                                                                                                                                                           SUBJECTIVE STATEMENT: I am having pain in the Right shoulder and muscle tightness.  It hurts when I hold it out to the side and up.  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  S/p Rt lumpectomy without SLNB on 03/28/23. Completed radiation 06/06/23.  Arm has stayed tender from Covid vaccines.    PAIN:  Are you having pain? Yes NPRS scale: 4-5/10 Pain location: Rt pectoralis  Pain orientation: Right  PAIN TYPE: tight Pain description: intermittent  Aggravating factors: using it at work Relieving factors: I use heat on it (Cautioned against this)   PRECAUTIONS: None  RED FLAGS: None   WEIGHT BEARING RESTRICTIONS: No  FALLS:  Has patient fallen in last 6 months? No  LIVING ENVIRONMENT: Lives with: lives with their family and lives with their spouse  OCCUPATION: full time hairdresser   LEISURE: walk a lot   HAND DOMINANCE: right   PRIOR LEVEL OF FUNCTION: Independent  PATIENT GOALS: decrease the shoulder pain    OBJECTIVE: Note: Objective measures were completed at Evaluation unless otherwise noted.  COGNITION: Overall cognitive status: Within functional limits for tasks assessed   PALPATION: No ttp rotator cuff or biceps, +1 ttp Rt UT more posteriorly, Rt GH  AP more stiff compared to the Left  OBSERVATIONS / OTHER ASSESSMENTS: Rt shoulder more protracted and forward in supine and in seated  POSTURE: rounded shoulders   UPPER EXTREMITY AROM/PROM:  A/PROM RIGHT   eval   Shoulder extension 45  Shoulder flexion 165  Shoulder abduction 165  Shoulder internal rotation   Shoulder external rotation 80 - pn    (Blank rows = not tested)  A/PROM LEFT   eval  Shoulder extension 60  Shoulder flexion 165  Shoulder abduction 170  Shoulder internal rotation   Shoulder external rotation 90    (Blank rows = not tested)  CERVICAL AROM: All within normal limits:   UPPER EXTREMITY STRENGTH:  5/5 bil pain with resisted flexion and abduction   QUICK DASH SURVEY: 18%                                                                                                                           TREATMENT DATE:  08/27/23 Eval performed Discussed POC, radiation fibrosis, and reasons for therapy , discussed DN Performed HEP education with pt performing each - see HEP section   PATIENT EDUCATION:  Education details: per today's note Person educated: Patient Education method: Programmer, multimedia, Demonstration, Tactile cues, Verbal cues, and Handouts Education comprehension: verbalized understanding, returned demonstration, and needs further education  HOME EXERCISE PROGRAM: HEP not copied in time: Included: doorway stretch both arms, single arm wall stretch, pectoralis minor stretch in seated with both hands behind the head, triceps stretch  ASSESSMENT:  CLINICAL IMPRESSION: Patient is a 59 y.o. female who was seen today for physical therapy evaluation and treatment for her Rt shoulder pain after lumpectomy and radiation.  She had no lymph nodes removed.  She works as a Producer, television/film/video and get Rt axilla and shoulder pain while working and using the arm a lot at home.  She had pretty bad skin reaction to radiation which lead to fibrosis and stiffness.  Her  shoulder joint and rotator cuff are fine and she has no signs of impingement.  She would benefit from pectoralis and latissimus and chest opening exercises with postural strengthening to improve tolerance to her work activities.  She would also benefit from dry needling for her more chronic Rt UT and posterior shoulder blade region pain and tension.    OBJECTIVE IMPAIRMENTS: decreased activity tolerance, decreased knowledge of condition, increased fascial restrictions, and pain.   ACTIVITY LIMITATIONS: carrying, lifting, and reach over head  PARTICIPATION LIMITATIONS: community activity and occupation  PERSONAL FACTORS: 1 comorbidity: radiation hx  are also affecting patient's functional outcome.   REHAB POTENTIAL: Excellent  CLINICAL DECISION MAKING: Stable/uncomplicated  EVALUATION COMPLEXITY: Low  GOALS: Goals reviewed with patient? Yes  SHORT TERM GOALS = LTGs: Target date: 10/08/23  Pt will be ind with final stretches to improve Rt upper quadrant tolerance to work  Baseline: Goal status: INITIAL  2.  Pt will report return to baseline work status including some mild UT and posterior shoulder pain but without Rt axillary pain  and stretching  Baseline:  Goal status: INITIAL  3.  Pt will report pain in the arm and chest of 2/10 or less at worst  Baseline: 5/10 Goal status: INITIAL   PLAN:  PT FREQUENCY: 1x/week due to work   PT DURATION: 6 weeks  PLANNED INTERVENTIONS: [id number]-Therapeutic exercises, [id number]- Self Care, [id number]- Manual therapy, Patient/Family education, Joint mobilization, Therapeutic exercises, Therapeutic activity, Neuromuscular re-education, Gait training, and Self Care, dry needling   PLAN FOR NEXT SESSION: review exercises, start to add some bands for posterior strength, STM Rt pect/lat, DN: UT posterior shoulder blade region (no lymphedema risk)   Idamae Lusher, PT 08/27/2023, 2:52 PM

## 2023-08-27 ENCOUNTER — Encounter: Payer: Self-pay | Admitting: Rehabilitation

## 2023-08-27 ENCOUNTER — Ambulatory Visit: Attending: Radiology | Admitting: Rehabilitation

## 2023-08-27 ENCOUNTER — Other Ambulatory Visit: Payer: Self-pay

## 2023-08-27 DIAGNOSIS — I890 Lymphedema, not elsewhere classified: Secondary | ICD-10-CM | POA: Insufficient documentation

## 2023-08-27 DIAGNOSIS — M25511 Pain in right shoulder: Secondary | ICD-10-CM | POA: Insufficient documentation

## 2023-08-27 DIAGNOSIS — D0511 Intraductal carcinoma in situ of right breast: Secondary | ICD-10-CM | POA: Diagnosis not present

## 2023-09-03 ENCOUNTER — Encounter: Payer: BC Managed Care – PPO | Admitting: Adult Health

## 2023-09-03 ENCOUNTER — Ambulatory Visit: Attending: Radiology

## 2023-09-03 DIAGNOSIS — D0511 Intraductal carcinoma in situ of right breast: Secondary | ICD-10-CM

## 2023-09-03 DIAGNOSIS — Z01419 Encounter for gynecological examination (general) (routine) without abnormal findings: Secondary | ICD-10-CM | POA: Diagnosis not present

## 2023-09-03 DIAGNOSIS — I890 Lymphedema, not elsewhere classified: Secondary | ICD-10-CM

## 2023-09-03 DIAGNOSIS — Z6827 Body mass index (BMI) 27.0-27.9, adult: Secondary | ICD-10-CM | POA: Diagnosis not present

## 2023-09-03 DIAGNOSIS — M25511 Pain in right shoulder: Secondary | ICD-10-CM | POA: Diagnosis not present

## 2023-09-03 DIAGNOSIS — Z1272 Encounter for screening for malignant neoplasm of vagina: Secondary | ICD-10-CM | POA: Diagnosis not present

## 2023-09-03 NOTE — Therapy (Signed)
 OUTPATIENT PHYSICAL THERAPY  UPPER EXTREMITY ONCOLOGY TREATMENT  Patient Name: Amy Lang MRN: [id number] DOB:02-18-1966, 58 y.o., female Today's Date: 09/03/2023  END OF SESSION:  PT End of Session - 09/03/23 1403     Visit Number 2    Number of Visits 7    Date for PT Re-Evaluation 10/08/23    Authorization Type none needed    PT Start Time 1402    PT Stop Time 1504    PT Time Calculation (min) 62 min    Activity Tolerance Patient tolerated treatment well    Behavior During Therapy [facility] for tasks assessed/performed             Past Medical History:  Diagnosis Date   Allergy    Anxiety    Asthma    Depression    Diabetes (HCC)    Dyslipidemia    History of radiation therapy    Right breast-05/08/23-06/06/23- Dr. Antony Blackbird   Hypertension    IBS (irritable bowel syndrome)    Lactose intolerance    Migraines    Obesity    PONV (postoperative nausea and vomiting)    Shortness of breath    Vaginal cancer (HCC)    Vitamin B 12 deficiency    Vitamin D deficiency    Past Surgical History:  Procedure Laterality Date   ABDOMINAL HYSTERECTOMY     still has bilateral ovaries   BREAST BIOPSY Right 03/02/2023   MM RT BREAST BX W LOC DEV 1ST LESION IMAGE BX SPEC STEREO GUIDE 03/02/2023 [facility]   BREAST BIOPSY  03/27/2023   MM RT RADIOACTIVE SEED LOC MAMMO GUIDE 03/27/2023 [facility]   BREAST EXCISIONAL BIOPSY Right 08/27/2017   BREAST LUMPECTOMY WITH RADIOACTIVE SEED LOCALIZATION Right 08/28/2017   Procedure: BREAST LUMPECTOMY WITH RADIOACTIVE SEED LOCALIZATION;  Surgeon: Harriette Bouillon, MD;  Location: [facility];  Service: General;  Laterality: Right;   BREAST LUMPECTOMY WITH RADIOACTIVE SEED LOCALIZATION Right 03/28/2023   Procedure: RIGHT BREAST SEED GUIDED LUMPECTOMY;  Surgeon: Emelia Loron, MD;  Location: [facility];  Service: General;  Laterality: Right;  LMA   CHOLECYSTECTOMY  04-04-11   single site    COLONOSCOPY W/ BIOPSIES  01/24/2013   ESOPHAGOGASTRODUODENOSCOPY     KNEE SURGERY     LAPAROTOMY  05/12/2012   Procedure: LAPAROTOMY;  Surgeon: Jeani Hawking, MD;  Location: [facility];  Service: Gynecology;;  laparotomy Maura Crandall salpingectomy-oopharectomy   NM MYOCAR PERF WALL MOTION  09/06/2009   normal   TRANSTHORACIC ECHOCARDIOGRAM  March 2014   Normal regional wall motion. EF 55%. Mild MR   Patient Active Problem List   Diagnosis Date Noted   Ductal carcinoma in situ of right breast 03/19/2023   Fluttering sensation of heart 06/14/2022   GERD (gastroesophageal reflux disease) 12/26/2021   COVID-19 06/07/2021   Acute bronchitis 06/07/2021   Chest pain, atypical 05/24/2021   Flank pain 05/24/2021   Sprain of left ankle 05/02/2021   Peroneal tendinitis 04/01/2021   Plantar fasciitis of left foot 04/01/2021   Insomnia disorder 06/23/2019   Sebaceous cyst 06/23/2019   Prediabetes 03/24/2019   Acute sinusitis 03/18/2018   CFS (chronic fatigue syndrome) 02/06/2018   Exercise-induced asthma 02/06/2018   Type 2 diabetes mellitus without complication, without long-term current use of insulin (HCC) 02/06/2018   Vitamin D deficiency 02/06/2018   Metabolic syndrome 12/30/2017   Bilateral carpal tunnel syndrome 12/03/2017   Well adult exam 09/11/2016   Adenopathy 01/13/2015   Trigeminal  neuralgia of left side of face 12/07/2014   Abdominal pain, epigastric 05/14/2014   Nausea with vomiting 05/14/2014   Eye exam abnormal, was told she had bled behind eye 08/22/2013   Obesity (BMI 30-39.9) 02/17/2013   Dizziness 02/17/2013   Family history of coronary artery disease 02/17/2013   IBS (irritable bowel syndrome)    Essential hypertension    Hyperlipidemia    Diarrhea - suspect IBS 12/23/2012   Hematochezia 12/23/2012   B12 deficiency 04/15/2012   Hot flashes 04/15/2012   ALLERGIC RHINITIS 12/02/2009   Asthma 12/02/2009   Anxiety disorder 06/30/2009   Depression 06/30/2009    LIPOMAS, MULTIPLE 06/29/2009   GRIEF REACTION 06/29/2009   Migraine 06/29/2009   DYSPNEA 06/29/2009    PCP: Loura Pardon, MD  REFERRING PROVIDER: Bryan Lemma, PA-C  REFERRING DIAG: DiagnosisD05.11 (ICD-10-CM) - Ductal carcinoma in situ of right breast  THERAPY DIAG:  Ductal carcinoma in situ of right breast  Lymphedema, not elsewhere classified  Acute pain of right shoulder  ONSET DATE: 03/28/23  Rationale for Evaluation and Treatment: Rehabilitation  SUBJECTIVE:                                                                                                                                                                                           SUBJECTIVE STATEMENT: I've been doing the exercises and they do seem to be helping.    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  S/p Rt lumpectomy without SLNB on 03/28/23. Completed radiation 06/06/23.  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 No not currently  PRECAUTIONS: None  RED FLAGS: None   WEIGHT BEARING RESTRICTIONS: No  FALLS:  Has patient fallen in last 6 months? No  LIVING ENVIRONMENT: Lives with: lives with their family and lives with their spouse  OCCUPATION: full time hairdresser   LEISURE: walk a lot   HAND DOMINANCE: right   PRIOR LEVEL OF FUNCTION: Independent  PATIENT GOALS: decrease the shoulder pain    OBJECTIVE: Note: Objective measures were completed at Evaluation unless otherwise noted.  COGNITION: Overall cognitive status: Within functional limits for tasks assessed   PALPATION: No ttp rotator cuff or biceps, +1 ttp Rt UT more posteriorly, Rt GH AP more stiff compared to the Left  OBSERVATIONS / OTHER ASSESSMENTS: Rt shoulder more protracted and forward in supine and in seated  POSTURE: rounded shoulders   UPPER EXTREMITY AROM/PROM:  A/PROM RIGHT    eval   Shoulder extension 45  Shoulder flexion 165  Shoulder abduction 165  Shoulder internal rotation   Shoulder external rotation 80 - pn    (Blank rows = not tested)  A/PROM LEFT   eval  Shoulder extension 60  Shoulder flexion 165  Shoulder abduction 170  Shoulder internal rotation   Shoulder external rotation 90    (Blank rows = not tested)  CERVICAL AROM: All within normal limits:   UPPER EXTREMITY STRENGTH:  5/5 bil pain with resisted flexion and abduction   QUICK DASH SURVEY: 18%                                                                                                                           TREATMENT DATE:  09/03/23: Therapeutic Exercises Pulleys into flex and abd x 2 mins each  Supine over half foam roll for following: Bil UE horz abd x 10, then bil UE scaption into a "V" x 10, and last bil UE abd in a "snow angel" x 5 with 5 sec holds Supine scapular series with yellow theraband x 10 each returning therapist demo for each Manual Therapy P/ROM to Rt shoulder into flex, abd and D2 to tolerance and with scapular depression throughout STM to Rt pect insertion where pt palpably tight and at Rt axillary scar tissue   08/27/23 Eval performed Discussed POC, radiation fibrosis, and reasons for therapy , discussed DN Performed HEP education with pt performing each - see HEP section   PATIENT EDUCATION:  Education details: Supine scapular series with yellow theraband Person educated: Patient Education method: Explanation, Demonstration, Tactile cues, Verbal cues, and Handouts Education comprehension: verbalized understanding, returned demonstration, and needs further education  HOME EXERCISE PROGRAM: HEP not copied in time: Included: doorway stretch both arms, single arm wall stretch, pectoralis minor stretch in seated with both hands behind the head, triceps stretch Supine scapular series  ASSESSMENT:  CLINICAL IMPRESSION: Pt is reporting noting some  improvements since starting care. She has been compliant with her HEP and repots Rt shoulder pain is improving. Today progressed pt to include A/ROM stretching over foam roll and postural strength with theraband. Pt reports feeling good stretches during session and her P/ROM was improved by end of session after manual therapy. 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 So was able to have one of our pelvic floor PT's, Lowella Dandy, come speak to pt during session today (while this therapist worked on manual therapy) and answer her questions about pelvic PT. Pt is very interested in pursuing this option and plans to speak to her GYN Dr. Vincente Poli about referral for this. Pt was very pleased at end of session and reports being grateful that she may find relief from her chronic symptoms.    OBJECTIVE IMPAIRMENTS: decreased activity tolerance, decreased knowledge of condition, increased fascial  restrictions, and pain.   ACTIVITY LIMITATIONS: carrying, lifting, and reach over head  PARTICIPATION LIMITATIONS: community activity and occupation  PERSONAL FACTORS: 1 comorbidity: radiation hx  are also affecting patient's functional outcome.   REHAB POTENTIAL: Excellent  CLINICAL DECISION MAKING: Stable/uncomplicated  EVALUATION COMPLEXITY: Low  GOALS: Goals reviewed with patient? Yes  SHORT TERM GOALS = LTGs: Target date: 10/08/23  Pt will be ind with final stretches to improve Rt upper quadrant tolerance to work  Baseline: Goal status: INITIAL  2.  Pt will report return to baseline work status including some mild UT and posterior shoulder pain but without Rt axillary pain and stretching  Baseline:  Goal status: INITIAL  3.  Pt will report pain in the arm and chest of 2/10 or less at worst  Baseline: 5/10 Goal status:  INITIAL   PLAN:  PT FREQUENCY: 1x/week due to work   PT DURATION: 6 weeks  PLANNED INTERVENTIONS: 97110-Therapeutic exercises, 97535- Self Care, 96045- Manual therapy, Patient/Family education, Joint mobilization, Therapeutic exercises, Therapeutic activity, Neuromuscular re-education, Gait training, and Self Care, dry needling   PLAN FOR NEXT SESSION: review exercises, review supine scapular strength for posterior strength, STM Rt pect/lat, DN: UT posterior shoulder blade region (no lymphedema risk)   Hermenia Bers, PTA 09/03/2023, 4:37 PM   Over Head Pull: Narrow and Wide Grip   Cancer Rehab [phone]   On back, knees bent, feet flat, band across thighs, elbows straight but relaxed. Pull hands apart (start). Keeping elbows straight, bring arms up and over head, hands toward floor. Keep pull steady on band. Hold momentarily. Return slowly, keeping pull steady, back to start. Then do same with a wider grip on the band (past shoulder width) Repeat _5-10__ times. Band color __yellow____   Side Pull: Double Arm   On back, knees bent, feet flat. Arms perpendicular to body, shoulder level, elbows straight but relaxed. Pull arms out to sides, elbows straight. Resistance band comes across collarbones, hands toward floor. Hold momentarily. Slowly return to starting position. Repeat _5-10__ times. Band color _yellow____   Sword   On back, knees bent, feet flat, left hand on left hip, right hand above left. Pull right arm DIAGONALLY (hip to shoulder) across chest. Bring right arm along head toward floor. Hold momentarily. Slowly return to starting position. Repeat _5-10__ times. Do with left arm. Band color _yellow_____   Shoulder Rotation: Double Arm   On back, knees bent, feet flat, elbows tucked at sides, bent 90, hands palms up. Pull hands apart and down toward floor, keeping elbows near sides. Hold momentarily. Slowly return to starting position. Repeat _5-10__ times. Band  color __yellow____

## 2023-09-03 NOTE — Patient Instructions (Addendum)
 [name]

## 2023-09-10 ENCOUNTER — Encounter: Payer: Self-pay | Admitting: Rehabilitative and Restorative Service Providers"

## 2023-09-10 ENCOUNTER — Ambulatory Visit: Admitting: Rehabilitative and Restorative Service Providers"

## 2023-09-10 DIAGNOSIS — I890 Lymphedema, not elsewhere classified: Secondary | ICD-10-CM

## 2023-09-10 DIAGNOSIS — M25511 Pain in right shoulder: Secondary | ICD-10-CM

## 2023-09-10 DIAGNOSIS — D0511 Intraductal carcinoma in situ of right breast: Secondary | ICD-10-CM

## 2023-09-10 NOTE — Therapy (Signed)
 OUTPATIENT PHYSICAL THERAPY  UPPER EXTREMITY ONCOLOGY TREATMENT  Patient Name: Amy Lang MRN: [id number] DOB:[birth year], 58 y.o., female Today's Date: 09/10/2023  END OF SESSION:  PT End of Session - 09/10/23 0806     Visit Number 3    Date for PT Re-Evaluation 10/08/23    Authorization Type BC/BS [employer]    PT Start Time 0800    PT Stop Time 0840    PT Time Calculation (min) 40 min    Activity Tolerance Patient tolerated treatment well    Behavior During Therapy [facility] for tasks assessed/performed             Past Medical History:  Diagnosis Date   Allergy    Anxiety    Asthma    Depression    Diabetes (HCC)    Dyslipidemia    History of radiation therapy    Right breast-05/08/23-06/06/23- Dr. Retta Caster   Hypertension    IBS (irritable bowel syndrome)    Lactose intolerance    Migraines    Obesity    PONV (postoperative nausea and vomiting)    Shortness of breath    Vaginal cancer (HCC)    Vitamin B 12 deficiency    Vitamin D deficiency    Past Surgical History:  Procedure Laterality Date   ABDOMINAL HYSTERECTOMY     still has bilateral ovaries   BREAST BIOPSY Right 03/02/2023   MM RT BREAST BX W LOC DEV 1ST LESION IMAGE BX SPEC STEREO GUIDE 03/02/2023 [facility]   BREAST BIOPSY  03/27/2023   MM RT RADIOACTIVE SEED LOC MAMMO GUIDE 03/27/2023 [facility]   BREAST EXCISIONAL BIOPSY Right 08/27/2017   BREAST LUMPECTOMY WITH RADIOACTIVE SEED LOCALIZATION Right 08/28/2017   Procedure: BREAST LUMPECTOMY WITH RADIOACTIVE SEED LOCALIZATION;  Surgeon: Sim Dryer, MD;  Location: [facility];  Service: General;  Laterality: Right;   BREAST LUMPECTOMY WITH RADIOACTIVE SEED LOCALIZATION Right 03/28/2023   Procedure: RIGHT BREAST SEED GUIDED LUMPECTOMY;  Surgeon: Enid Harry, MD;  Location: [facility];  Service: General;  Laterality: Right;  LMA   CHOLECYSTECTOMY  04-04-11   single site   COLONOSCOPY W/  BIOPSIES  01/24/2013   ESOPHAGOGASTRODUODENOSCOPY     KNEE SURGERY     LAPAROTOMY  05/12/2012   Procedure: LAPAROTOMY;  Surgeon: Martine Sleek, MD;  Location: [facility];  Service: Gynecology;;  laparotomy Roxann Copper salpingectomy-oopharectomy   NM MYOCAR PERF WALL MOTION  09/06/2009   normal   TRANSTHORACIC ECHOCARDIOGRAM  March 2014   Normal regional wall motion. EF 55%. Mild MR   Patient Active Problem List   Diagnosis Date Noted   Ductal carcinoma in situ of right breast 03/19/2023   Fluttering sensation of heart 06/14/2022   GERD (gastroesophageal reflux disease) 12/26/2021   COVID-19 06/07/2021   Acute bronchitis 06/07/2021   Chest pain, atypical 05/24/2021   Flank pain 05/24/2021   Sprain of left ankle 05/02/2021   Peroneal tendinitis 04/01/2021   Plantar fasciitis of left foot 04/01/2021   Insomnia disorder 06/23/2019   Sebaceous cyst 06/23/2019   Prediabetes 03/24/2019   Acute sinusitis 03/18/2018   CFS (chronic fatigue syndrome) 02/06/2018   Exercise-induced asthma 02/06/2018   Type 2 diabetes mellitus without complication, without long-term current use of insulin (HCC) 02/06/2018   Vitamin D deficiency 02/06/2018   Metabolic syndrome 12/30/2017   Bilateral carpal tunnel syndrome 12/03/2017   Well adult exam 09/11/2016   Adenopathy 01/13/2015   Trigeminal neuralgia of left side of face 12/07/2014  Abdominal pain, epigastric 05/14/2014   Nausea with vomiting 05/14/2014   Eye exam abnormal, was told she had bled behind eye 08/22/2013   Obesity (BMI 30-39.9) 02/17/2013   Dizziness 02/17/2013   Family history of coronary artery disease 02/17/2013   IBS (irritable bowel syndrome)    Essential hypertension    Hyperlipidemia    Diarrhea - suspect IBS 12/23/2012   Hematochezia 12/23/2012   B12 deficiency 04/15/2012   Hot flashes 04/15/2012   ALLERGIC RHINITIS 12/02/2009   Asthma 12/02/2009   Anxiety disorder 06/30/2009   Depression 06/30/2009   LIPOMAS, MULTIPLE  06/29/2009   GRIEF REACTION 06/29/2009   Migraine 06/29/2009   DYSPNEA 06/29/2009    PCP: Adaline Ada, MD  REFERRING PROVIDER: Julio Ohm, PA-C  REFERRING DIAG: DiagnosisD05.11 (ICD-10-CM) - Ductal carcinoma in situ of right breast  THERAPY DIAG:  Ductal carcinoma in situ of right breast  Lymphedema, not elsewhere classified  Acute pain of right shoulder  ONSET DATE: 03/28/23  Rationale for Evaluation and Treatment: Rehabilitation  SUBJECTIVE:                                                                                                                                                                                           SUBJECTIVE STATEMENT: I've been doing the exercises and they do seem to be helping.    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  S/p Rt lumpectomy without SLNB on 03/28/23. Completed radiation 06/06/23.  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 No not currently  PRECAUTIONS: None  RED FLAGS: None   WEIGHT BEARING RESTRICTIONS: No  FALLS:  Has patient fallen in last 6 months? No  LIVING ENVIRONMENT: Lives with: lives with their family and lives with their spouse  OCCUPATION: full time hairdresser   LEISURE: walk a lot   HAND DOMINANCE: right   PRIOR LEVEL OF FUNCTION: Independent  PATIENT GOALS: decrease the shoulder pain    OBJECTIVE: Note: Objective measures were completed at Evaluation unless otherwise noted.  COGNITION: Overall cognitive status: Within functional limits for tasks assessed   PALPATION: No ttp rotator cuff or biceps, +1 ttp Rt UT more posteriorly, Rt GH AP more stiff compared to the Left  OBSERVATIONS / OTHER ASSESSMENTS: Rt shoulder more protracted and forward in supine and in seated  POSTURE: rounded shoulders   UPPER EXTREMITY AROM/PROM:  A/PROM RIGHT   eval    Shoulder extension 45  Shoulder flexion 165  Shoulder abduction 165  Shoulder  internal rotation   Shoulder external rotation 80 - pn    (Blank rows = not tested)  A/PROM LEFT   eval  Shoulder extension 60  Shoulder flexion 165  Shoulder abduction 170  Shoulder internal rotation   Shoulder external rotation 90    (Blank rows = not tested)  CERVICAL AROM: All within normal limits:   UPPER EXTREMITY STRENGTH:  5/5 bil pain with resisted flexion and abduction   QUICK DASH SURVEY: Eval: 18%                                                                                                                           TREATMENT DATE:  09/10/2023: Shoulder pulleys for flexion and abduction x2 min each Supine shoulder flexion with 2# weight on dowel rod 2x10 Supine shoulder chest press with 2# weight on dowel rod 2x10 Supine shoulder ER with red tband 2x10 Supine shoulder horizontal abduction with red tband 2x10 Seated scalene stretch 2x20 sec bilat Trigger Point Dry Needling Initial Treatment: Pt instructed on Dry Needling rational, procedures, and possible side effects. Pt instructed to expect mild to moderate muscle soreness later in the day and/or into the next day.  Pt instructed in methods to reduce muscle soreness. Pt instructed to continue prescribed HEP. Because Dry Needling was performed over or adjacent to a lung field, pt was educated on S/S of pneumothorax and to seek immediate medical attention should they occur.  Patient was educated on signs and symptoms of infection and other risk factors and advised to seek medical attention should they occur.  Patient verbalized understanding of these instructions and education.  Patient Verbal Consent Given: Yes Education Handout Provided: Yes Muscles Treated: right upper trap, right cervical multifidi, right rhomboids Electrical Stimulation Performed: No Treatment Response/Outcome: Utilized skilled palpation to identify bony  landmarks and trigger points.  Able to illicit twitch response and muscle elongation.  Soft tissue mobilization with massage cream following to further promote tissue elongation.     09/03/23: Therapeutic Exercises Pulleys into flex and abd x 2 mins each  Supine over half foam roll for following: Bil UE horz abd x 10, then bil UE scaption into a "V" x 10, and last bil UE abd in a "snow angel" x 5 with 5 sec holds Supine scapular series with yellow theraband x 10 each returning therapist demo for each Manual Therapy P/ROM to Rt shoulder into flex, abd and D2 to tolerance and with scapular depression throughout STM to Rt pect insertion where pt palpably tight and at Rt axillary scar tissue   08/27/23 Eval performed Discussed POC, radiation fibrosis, and reasons for therapy , discussed DN Performed HEP education with pt performing each - see HEP section   PATIENT EDUCATION:  Education details: Supine scapular series with yellow theraband Person educated: Patient Education method: Explanation, Demonstration, Tactile cues, Verbal cues, and Handouts Education comprehension: verbalized understanding, returned demonstration, and needs further education  HOME EXERCISE PROGRAM: Access Code: [email or web address] URL:  [email or web address] Date: 09/10/2023 Prepared by: Chaneta Comer Shakeira Rhee  Exercises - Supine Shoulder Flexion Extension AAROM with Dowel  - 1 x daily - 7 x weekly - 2 sets - 10 reps - Supine Shoulder Horizontal Abduction with Resistance  - 1 x daily - 7 x weekly - 2 sets - 10 reps - Supine Shoulder External Rotation with Resistance  - 1 x daily - 7 x weekly - 2 sets - 10 reps - Standing Shoulder Single Arm PNF D2 Flexion with Resistance  - 1 x daily - 7 x weekly - 3 sets - 10 reps - Doorway Pec Stretch at 90 Degrees Abduction  - 1 x daily - 7 x weekly - 2 reps - 20 sec hold - Standing Pec Stretch at Wall  - 1 x daily - 7 x weekly - 2 reps - 20 sec hold - Seated Chest Stretch with  Hands Behind Head  - 1 x daily - 7 x weekly - 2 reps - 20 sec hold - Standing Overhead Triceps Stretch  - 1 x daily - 7 x weekly - 2 reps - 20 sec hold - Seated Scalenes Stretch  - 1 x daily - 7 x weekly - 2 reps - 20 sec hold  Supine scapular series  ASSESSMENT:  CLINICAL IMPRESSION: Ms Huseby presents to skilled PT reporting that she has been doing her exercises.  Patient provided with updated HEP transferred to MedBridge to allow her to utilize the app.  Patient with good participation throughout.  Patient educated on dry needling and she was agreeable to treatment.  Patient with good response to dry needling and reported good twitch response.  Patient with further reports of decreased tension and improved feelings of muscle looseness with manual therapy for soft tissue mobilization at the end of the session.  Patient educated about the weight of her purse and how lightening the weight would be more beneficial, she verbalizes understanding and that she has another smaller purse already ordered. Patient continues to require skilled PT to progress towards goal related activities.   OBJECTIVE IMPAIRMENTS: decreased activity tolerance, decreased knowledge of condition, increased fascial restrictions, and pain.   ACTIVITY LIMITATIONS: carrying, lifting, and reach over head  PARTICIPATION LIMITATIONS: community activity and occupation  PERSONAL FACTORS: 1 comorbidity: radiation hx  are also affecting patient's functional outcome.   REHAB POTENTIAL: Excellent  CLINICAL DECISION MAKING: Stable/uncomplicated  EVALUATION COMPLEXITY: Low  GOALS: Goals reviewed with patient? Yes  SHORT TERM GOALS = LTGs: Target date: 10/08/23  Pt will be ind with final stretches to improve Rt upper quadrant tolerance to work  Baseline: Goal status: Ongoing  2.  Pt will report return to baseline work status including some mild UT and posterior shoulder pain but without Rt axillary pain and stretching   Baseline:  Goal status: INITIAL  3.  Pt will report pain in the arm and chest of 2/10 or less at worst  Baseline: 5/10 Goal status: INITIAL   PLAN:  PT FREQUENCY: 1x/week due to work   PT DURATION: 6 weeks  PLANNED INTERVENTIONS: 97110-Therapeutic exercises, 97535- Self Care, 16109- Manual therapy, Patient/Family education, Joint mobilization, Therapeutic exercises, Therapeutic activity, Neuromuscular re-education, Gait training, and Self Care, dry needling   PLAN FOR NEXT SESSION: review exercises, review supine scapular strength for posterior strength, STM Rt pect/lat, DN: UT posterior shoulder blade region (no lymphedema risk)     Robyne Christen, PT, DPT 09/10/23, 9:31 AM  [facility] [address] [place],  [place] [address] Phone # [phone] Fax [phone]

## 2023-09-12 ENCOUNTER — Other Ambulatory Visit: Payer: Self-pay | Admitting: Internal Medicine

## 2023-09-17 ENCOUNTER — Encounter: Payer: Self-pay | Admitting: Adult Health

## 2023-09-17 ENCOUNTER — Inpatient Hospital Stay: Payer: BC Managed Care – PPO | Attending: Radiation Oncology | Admitting: Adult Health

## 2023-09-17 ENCOUNTER — Other Ambulatory Visit: Payer: Self-pay | Admitting: Internal Medicine

## 2023-09-17 ENCOUNTER — Encounter: Payer: Self-pay | Admitting: Rehabilitation

## 2023-09-17 ENCOUNTER — Ambulatory Visit: Admitting: Rehabilitation

## 2023-09-17 VITALS — BP 112/54 | HR 67 | Temp 97.5°F | Resp 17 | Wt 150.0 lb

## 2023-09-17 DIAGNOSIS — Z801 Family history of malignant neoplasm of trachea, bronchus and lung: Secondary | ICD-10-CM | POA: Insufficient documentation

## 2023-09-17 DIAGNOSIS — Z79810 Long term (current) use of selective estrogen receptor modulators (SERMs): Secondary | ICD-10-CM | POA: Diagnosis not present

## 2023-09-17 DIAGNOSIS — D0511 Intraductal carcinoma in situ of right breast: Secondary | ICD-10-CM | POA: Diagnosis not present

## 2023-09-17 DIAGNOSIS — Z803 Family history of malignant neoplasm of breast: Secondary | ICD-10-CM | POA: Diagnosis not present

## 2023-09-17 DIAGNOSIS — M25511 Pain in right shoulder: Secondary | ICD-10-CM | POA: Diagnosis not present

## 2023-09-17 DIAGNOSIS — Z170 Estrogen receptor positive status [ER+]: Secondary | ICD-10-CM | POA: Diagnosis not present

## 2023-09-17 DIAGNOSIS — I890 Lymphedema, not elsewhere classified: Secondary | ICD-10-CM | POA: Diagnosis not present

## 2023-09-17 DIAGNOSIS — Z79899 Other long term (current) drug therapy: Secondary | ICD-10-CM | POA: Diagnosis not present

## 2023-09-17 DIAGNOSIS — Z923 Personal history of irradiation: Secondary | ICD-10-CM | POA: Insufficient documentation

## 2023-09-17 DIAGNOSIS — Z1722 Progesterone receptor negative status: Secondary | ICD-10-CM | POA: Diagnosis not present

## 2023-09-17 NOTE — Progress Notes (Unsigned)
 SURVIVORSHIP VISIT:  BRIEF ONCOLOGIC HISTORY:  Oncology History  Ductal carcinoma in situ of right breast  03/28/2023 Surgery   Right lumpectomy: High-grade DCIS with necrosis and calcifications discontinuously involving fibrotic breast tissue, no invasive cancer, margins negative, ER 40%, PR 0%    05/09/2023 - 06/06/2023 Radiation Therapy   Site: Breast, Right Technique: 3D Mode: Photon Dose Per Fraction: 2.67 Gy Prescribed Dose (Delivered / Prescribed): 40.05 Gy / 40.05 Gy Prescribed Fxs (Delivered / Prescribed): 15 / 15   Site: Breast, Right - Boost  Technique: 3D Mode: Photon Dose Per Fraction: 2 Gy Prescribed Dose (Delivered / Prescribed): 10 Gy / 10 Gy Prescribed Fxs (Delivered / Prescribed): 5 / 5   05/2023 -  Anti-estrogen oral therapy   Tamoxifen  x 5 years     INTERVAL HISTORY:  Amy Lang to review her survivorship care plan detailing her treatment course for breast cancer, as well as monitoring long-term side effects of that treatment, education regarding health maintenance, screening, and overall wellness and health promotion.     Overall, Amy Lang reports feeling quite well.  She is doing quite well today.  She is taking tamoxifen  daily and is struggling with vaginal wetness and discharge that are significantly impacting her quality of life.    REVIEW OF SYSTEMS:  Review of Systems  Constitutional:  Negative for appetite change, chills, fatigue, fever and unexpected weight change.  HENT:   Negative for hearing loss, lump/mass and trouble swallowing.   Eyes:  Negative for eye problems and icterus.  Respiratory:  Negative for chest tightness, cough and shortness of breath.   Cardiovascular:  Negative for chest pain, leg swelling and palpitations.  Gastrointestinal:  Negative for abdominal distention, abdominal pain, constipation, diarrhea, nausea and vomiting.  Endocrine: Negative for hot flashes.  Genitourinary:  Positive for pelvic pain and vaginal discharge.  Negative for difficulty urinating.   Musculoskeletal:  Negative for arthralgias.  Skin:  Negative for itching and rash.  Neurological:  Negative for dizziness, extremity weakness, headaches and numbness.  Hematological:  Negative for adenopathy. Does not bruise/bleed easily.  Psychiatric/Behavioral:  Negative for depression. The patient is not nervous/anxious.   Breast: Denies any new nodularity, masses, tenderness, nipple changes, or nipple discharge.       PAST MEDICAL/SURGICAL HISTORY:  Past Medical History:  Diagnosis Date   Allergy    Anxiety    Asthma    Depression    Diabetes (HCC)    Dyslipidemia    History of radiation therapy    Right breast-05/08/23-06/06/23- Dr. Retta Caster   Hypertension    IBS (irritable bowel syndrome)    Lactose intolerance    Migraines    Obesity    PONV (postoperative nausea and vomiting)    Shortness of breath    Vaginal cancer (HCC)    Vitamin B 12 deficiency    Vitamin D  deficiency    Past Surgical History:  Procedure Laterality Date   ABDOMINAL HYSTERECTOMY     still has bilateral ovaries   BREAST BIOPSY Right 03/02/2023   MM RT BREAST BX W LOC DEV 1ST LESION IMAGE BX SPEC STEREO GUIDE 03/02/2023 [facility]   BREAST BIOPSY  03/27/2023   MM RT RADIOACTIVE SEED LOC MAMMO GUIDE 03/27/2023 [facility]   BREAST EXCISIONAL BIOPSY Right 08/27/2017   BREAST LUMPECTOMY WITH RADIOACTIVE SEED LOCALIZATION Right 08/28/2017   Procedure: BREAST LUMPECTOMY WITH RADIOACTIVE SEED LOCALIZATION;  Surgeon: Sim Dryer, MD;  Location: [facility];  Service: General;  Laterality:  Right;   BREAST LUMPECTOMY WITH RADIOACTIVE SEED LOCALIZATION Right 03/28/2023   Procedure: RIGHT BREAST SEED GUIDED LUMPECTOMY;  Surgeon: Enid Harry, MD;  Location:  SURGERY CENTER;  Service: General;  Laterality: Right;  LMA   CHOLECYSTECTOMY  04-04-11   single site   COLONOSCOPY W/ BIOPSIES  01/24/2013    ESOPHAGOGASTRODUODENOSCOPY     KNEE SURGERY     LAPAROTOMY  05/12/2012   Procedure: LAPAROTOMY;  Surgeon: Martine Sleek, MD;  Location: [facility];  Service: Gynecology;;  laparotomy Roxann Copper salpingectomy-oopharectomy   NM MYOCAR PERF WALL MOTION  09/06/2009   normal   TRANSTHORACIC ECHOCARDIOGRAM  March 2014   Normal regional wall motion. EF 55%. Mild MR     ALLERGIES:  Allergies  Allergen Reactions   Bee Pollen Anaphylaxis   Butorphanol    Butorphanol Tartrate Other (See Comments)    REACTION: hallucinations ( stadol)   Codeine    Codeine Sulfate Hives and Nausea And Vomiting   Crestor [Rosuvastatin] Other (See Comments)    Body aches   Darvocet [Propoxyphene N-Acetaminophen ] Hives and Nausea And Vomiting   Estrogens    Levofloxacin Other (See Comments)    abdominal pain   Metformin  And Related     diarrhea   Penicillamine    Penicillins Other (See Comments)    Seizure    Sulfa Antibiotics Hives, Nausea And Vomiting and Other (See Comments)   Latex Rash    Blisters and redness with bandaids-also sensitivity to latex gloves     CURRENT MEDICATIONS:  Outpatient Encounter Medications as of 09/17/2023  Medication Sig   albuterol  (VENTOLIN  HFA) 108 (90 Base) MCG/ACT inhaler TAKE 2 PUFFS BY MOUTH EVERY 6 HOURS AS NEEDED FOR WHEEZE OR SHORTNESS OF BREATH   ALPRAZolam  (XANAX ) 0.5 MG tablet Take 1 tablet (0.5 mg total) by mouth 2 (two) times daily as needed for anxiety.   amphetamine -dextroamphetamine  (ADDERALL) 10 MG tablet Take 1 tablet (10 mg total) by mouth 2 (two) times daily. (Patient not taking: Reported on 08/13/2023)   aspirin  EC 81 MG tablet Take 81 mg by mouth every evening.   cetirizine (ZYRTEC) 10 MG tablet Take 10 mg by mouth daily.   Cholecalciferol (VITAMIN D3) 50 MCG (2000 UT) capsule Take 1 capsule (2,000 Units total) by mouth daily.   cyanocobalamin  (VITAMIN B12) 1000 MCG/ML injection INJECT 1 ML INTO MUSCLE OR SUBCUTANEOUSLY EVERY 2 WEEKS (LIFETIME)    empagliflozin  (JARDIANCE ) 10 MG TABS tablet TAKE 1 TABLET BY MOUTH EVERY DAY   FLUoxetine  (PROZAC ) 40 MG capsule TAKE 1 CAPSULE BY MOUTH EVERY DAY   fluticasone  (FLONASE ) 50 MCG/ACT nasal spray SPRAY 2 SPRAYS INTO EACH NOSTRIL EVERY DAY   gabapentin  (NEURONTIN ) 100 MG capsule Take 1-2 capsules (100-200 mg total) by mouth at bedtime. (Patient taking differently: Take 100-200 mg by mouth daily as needed.)   gabapentin  (NEURONTIN ) 300 MG capsule Take 1 capsule (300 mg total) by mouth at bedtime.   losartan  (COZAAR ) 50 MG tablet TAKE 2 TABLETS (100MG ) BY MOUTH EVERY DAY   Multiple Vitamins-Minerals (PRESERVISION AREDS 2) CAPS Take 1 capsule by mouth at bedtime.   Omega-3 Fatty Acids (FISH OIL) 1000 MG CAPS Take 1,000 mg by mouth daily.   ondansetron  (ZOFRAN ) 4 MG tablet Take 1 tablet (4 mg total) by mouth every 8 (eight) hours as needed for nausea or vomiting.   pantoprazole  (PROTONIX ) 40 MG tablet Take 1 tablet (40 mg total) by mouth daily.   Probiotic Product (PROBIOTIC ADVANCED PO) Take by mouth  daily.   Semaglutide , 1 MG/DOSE, 4 MG/3ML SOPN Inject 1 mg as directed once a week.   SYRINGE-NEEDLE, DISP, 3 ML (BD ECLIPSE SYRINGE) 25G X 1" 3 ML MISC Use sq for B12 inj   tamoxifen  (NOLVADEX ) 10 MG tablet Take 0.5 tablets (5 mg total) by mouth daily.   topiramate  (TOPAMAX ) 200 MG tablet TAKE 1/2-1 TABLETS BY MOUTH DAILY   triamcinolone  ointment (KENALOG ) 0.5 % Apply 1 Application topically 2 (two) times daily.   venlafaxine  XR (EFFEXOR -XR) 37.5 MG 24 hr capsule TAKE 1 CAPSULE BY MOUTH DAILY WITH BREAKFAST.   vitamin C (ASCORBIC ACID) 500 MG tablet Take 500 mg by mouth daily.   vitamin E 1000 UNIT capsule Take 1,000 Units by mouth daily.   Zinc 50 MG CAPS Take by mouth.   No facility-administered encounter medications on file as of 09/17/2023.     ONCOLOGIC FAMILY HISTORY:  Family History  Problem Relation Age of Onset   Heart disease Mother    COPD Mother    Heart attack Mother 13   Diabetes  Mother    Hypertension Mother    Hyperlipidemia Mother    Kidney disease Mother    Thyroid  disease Mother    Lung cancer Father    Hypertension Father    Hyperlipidemia Father    Heart disease Maternal Grandmother    Breast cancer Maternal Grandmother 29   Heart disease Maternal Grandfather    Heart disease Paternal Grandmother    Heart disease Paternal Grandfather    Colon cancer Neg Hx    Esophageal cancer Neg Hx    Rectal cancer Neg Hx    Stomach cancer Neg Hx      SOCIAL HISTORY:  Social History   Socioeconomic History   Marital status: Married    Spouse name: Dayanne Yiu   Number of children: Not on file   Years of education: Not on file   Highest education level: Not on file  Occupational History   Not on file  Tobacco Use   Smoking status: Never   Smokeless tobacco: Never   Tobacco comments:    Regular Exercise - No  Vaping Use   Vaping status: Never Used  Substance and Sexual Activity   Alcohol use: No   Drug use: No   Sexual activity: Yes    Comment: hysterectomy  Other Topics Concern   Not on file  Social History Narrative   [name] with no children. Never smoked. Occasional alcohol.   Works as a Scientist, research (medical) at [employer]. Is on her feet but 6 days per week.   She does a treadmill at home and walking outside of these 3-4 times a week.   Social Drivers of Corporate investment banker Strain: Not on file  Food Insecurity: No Food Insecurity (03/19/2023)   Hunger Vital Sign    Worried About Running Out of Food in the Last Year: Never true    Ran Out of Food in the Last Year: Never true  Transportation Needs: No Transportation Needs (03/19/2023)   PRAPARE - Administrator, Civil Service (Medical): No    Lack of Transportation (Non-Medical): No  Physical Activity: Not on file  Stress: Not on file  Social Connections: Not on file  Intimate Partner Violence: Not At Risk (03/19/2023)   Humiliation, Afraid, Rape, and  Kick questionnaire    Fear of Current or Ex-Partner: No    Emotionally Abused: No    Physically Abused: No  Sexually Abused: No     OBSERVATIONS/OBJECTIVE:  BP (!) 112/54 (BP Location: Left Arm, Patient Position: Sitting)   Pulse 67   Temp (!) 97.5 F (36.4 C) (Temporal)   Resp 17   Wt 150 lb (68 kg)   SpO2 100%   BMI 29.29 kg/m  GENERAL: Patient is a well appearing female in no acute distress HEENT:  Sclerae anicteric.  Oropharynx clear and moist. No ulcerations or evidence of oropharyngeal candidiasis. Neck is supple.  NODES:  No cervical, supraclavicular, or axillary lymphadenopathy palpated.  BREAST EXAM:  right breast s/p lumpectomy and radiation, no sign of local recurrence, left breast benign.   LUNGS:  Clear to auscultation bilaterally.  No wheezes or rhonchi. HEART:  Regular rate and rhythm. No murmur appreciated. ABDOMEN:  Soft, nontender.  Positive, normoactive bowel sounds. No organomegaly palpated. MSK:  No focal spinal tenderness to palpation. Full range of motion bilaterally in the upper extremities. EXTREMITIES:  No peripheral edema.   SKIN:  Clear with no obvious rashes or skin changes. No nail dyscrasia. NEURO:  Nonfocal. Well oriented.  Appropriate affect.   LABORATORY DATA:  None for this visit.  DIAGNOSTIC IMAGING:  None for this visit.      ASSESSMENT AND PLAN:  Ms.. Amy Lang is a pleasant 58 y.o. female with Stage 0 right breast DCIS, ER+/PR-, diagnosed in 02/2023, treated with lumpectomy, adjuvant radiation therapy, and anti-estrogen therapy with Tamoxifen  beginning in 05/2023.  She presents to the Survivorship Clinic for our initial meeting and routine follow-up post-completion of treatment for breast cancer.    1. Stage 0 right breast cancer:  Ms. Spellman is continuing to recover from definitive treatment for breast cancer. She will follow-up with her medical oncologist, Dr. Lee Public in 6 months with history and physical exam per surveillance  protocol. Her mammogram is due 01/2024; orders placed today.   Today, a comprehensive survivorship care plan and treatment summary was reviewed with the patient today detailing her breast cancer diagnosis, treatment course, potential late/long-term effects of treatment, appropriate follow-up care with recommendations for the future, and patient education resources.  A copy of this summary, along with a letter will be sent to the patient's primary care provider via mail/fax/In Basket message after today's visit.    2. Vaginal discharge: I recommended she stop taking tamoxifen  x 2 weeks so we can understand if the discharge improves.  We discussed aromatase inhibitors as a possibility for antiestrogen therapy to reduce her breast cancer recurrence risk.    3. Bone health:  She was given education on specific activities to promote bone health.  4. Cancer screening:  Due to Ms. Skilton's history and her age, she should receive screening for skin cancers, colon cancer, and gynecologic cancers.  The information and recommendations are listed on the patient's comprehensive care plan/treatment summary and were reviewed in detail with the patient.    5. Health maintenance and wellness promotion: Ms. Friddle was encouraged to consume 5-7 servings of fruits and vegetables per day. We reviewed the "Nutrition Rainbow" handout.  She was also encouraged to engage in moderate to vigorous exercise for 30 minutes per day most days of the week.  She was instructed to limit her alcohol consumption and continue to abstain from tobacco use.     6. Support services/counseling: It is not uncommon for this period of the patient's cancer care trajectory to be one of many emotions and stressors.   She was given information regarding our available services and encouraged to  contact me with any questions or for help enrolling in any of our support group/programs.    Follow up instructions:    -Return to cancer center in 6 months  for f/u with Dr. Lee Public  -Mammogram due in 01/2024 -RTC in 2 weeks virtual or in person  -She is welcome to return back to the Survivorship Clinic at any time; no additional follow-up needed at this time.  -Consider referral back to survivorship as a long-term survivor for continued surveillance  The patient was provided an opportunity to ask questions and all were answered. The patient agreed with the plan and demonstrated an understanding of the instructions.   Total encounter time:45 minutes*in face-to-face visit time, chart review, lab review, care coordination, order entry, and documentation of the encounter time.    Alwin Baars, NP 09/17/23 12:58 PM Medical Oncology and Hematology [facility] [address] [place], [place] [address] Tel. [phone]    Fax. [phone]  *Total Encounter Time as defined by the Centers for Medicare and Medicaid Services includes, in addition to the face-to-face time of a patient visit (documented in the note above) non-face-to-face time: obtaining and reviewing outside history, ordering and reviewing medications, tests or procedures, care coordination (communications with other health care professionals or caregivers) and documentation in the medical record.

## 2023-09-17 NOTE — Therapy (Signed)
 OUTPATIENT PHYSICAL THERAPY  UPPER EXTREMITY ONCOLOGY TREATMENT  Patient Name: Amy Lang MRN: [id number] DOB:[birth year], 58 y.o., female Today's Date: 09/17/2023  END OF SESSION:  PT End of Session - 09/17/23 1001     Visit Number 4    Number of Visits 7    Date for PT Re-Evaluation 10/08/23    PT Start Time 1003    PT Stop Time 1045    PT Time Calculation (min) 42 min    Activity Tolerance Patient tolerated treatment well    Behavior During Therapy [facility] for tasks assessed/performed             Past Medical History:  Diagnosis Date   Allergy    Anxiety    Asthma    Depression    Diabetes (HCC)    Dyslipidemia    History of radiation therapy    Right breast-05/08/23-06/06/23- Dr. Retta Caster   Hypertension    IBS (irritable bowel syndrome)    Lactose intolerance    Migraines    Obesity    PONV (postoperative nausea and vomiting)    Shortness of breath    Vaginal cancer (HCC)    Vitamin B 12 deficiency    Vitamin D  deficiency    Past Surgical History:  Procedure Laterality Date   ABDOMINAL HYSTERECTOMY     still has bilateral ovaries   BREAST BIOPSY Right 03/02/2023   MM RT BREAST BX W LOC DEV 1ST LESION IMAGE BX SPEC STEREO GUIDE 03/02/2023 [facility]   BREAST BIOPSY  03/27/2023   MM RT RADIOACTIVE SEED LOC MAMMO GUIDE 03/27/2023 [facility]   BREAST EXCISIONAL BIOPSY Right 08/27/2017   BREAST LUMPECTOMY WITH RADIOACTIVE SEED LOCALIZATION Right 08/28/2017   Procedure: BREAST LUMPECTOMY WITH RADIOACTIVE SEED LOCALIZATION;  Surgeon: Sim Dryer, MD;  Location: [facility];  Service: General;  Laterality: Right;   BREAST LUMPECTOMY WITH RADIOACTIVE SEED LOCALIZATION Right 03/28/2023   Procedure: RIGHT BREAST SEED GUIDED LUMPECTOMY;  Surgeon: Enid Harry, MD;  Location: [facility];  Service: General;  Laterality: Right;  LMA   CHOLECYSTECTOMY  04-04-11   single site   COLONOSCOPY W/ BIOPSIES  01/24/2013    ESOPHAGOGASTRODUODENOSCOPY     KNEE SURGERY     LAPAROTOMY  05/12/2012   Procedure: LAPAROTOMY;  Surgeon: Martine Sleek, MD;  Location: [facility];  Service: Gynecology;;  laparotomy Roxann Copper salpingectomy-oopharectomy   NM MYOCAR PERF WALL MOTION  09/06/2009   normal   TRANSTHORACIC ECHOCARDIOGRAM  March 2014   Normal regional wall motion. EF 55%. Mild MR   Patient Active Problem List   Diagnosis Date Noted   Ductal carcinoma in situ of right breast 03/19/2023   Fluttering sensation of heart 06/14/2022   GERD (gastroesophageal reflux disease) 12/26/2021   COVID-19 06/07/2021   Acute bronchitis 06/07/2021   Chest pain, atypical 05/24/2021   Flank pain 05/24/2021   Sprain of left ankle 05/02/2021   Peroneal tendinitis 04/01/2021   Plantar fasciitis of left foot 04/01/2021   Insomnia disorder 06/23/2019   Sebaceous cyst 06/23/2019   Prediabetes 03/24/2019   Acute sinusitis 03/18/2018   CFS (chronic fatigue syndrome) 02/06/2018   Exercise-induced asthma 02/06/2018   Type 2 diabetes mellitus without complication, without long-term current use of insulin  (HCC) 02/06/2018   Vitamin D  deficiency 02/06/2018   Metabolic syndrome 12/30/2017   Bilateral carpal tunnel syndrome 12/03/2017   Well adult exam 09/11/2016   Adenopathy 01/13/2015   Trigeminal neuralgia of left side of face 12/07/2014  Abdominal pain, epigastric 05/14/2014   Nausea with vomiting 05/14/2014   Eye exam abnormal, was told she had bled behind eye 08/22/2013   Obesity (BMI 30-39.9) 02/17/2013   Dizziness 02/17/2013   Family history of coronary artery disease 02/17/2013   IBS (irritable bowel syndrome)    Essential hypertension    Hyperlipidemia    Diarrhea - suspect IBS 12/23/2012   Hematochezia 12/23/2012   B12 deficiency 04/15/2012   Hot flashes 04/15/2012   ALLERGIC RHINITIS 12/02/2009   Asthma 12/02/2009   Anxiety disorder 06/30/2009   Depression 06/30/2009   LIPOMAS, MULTIPLE 06/29/2009   GRIEF  REACTION 06/29/2009   Migraine 06/29/2009   DYSPNEA 06/29/2009    PCP: Adaline Ada, MD  REFERRING PROVIDER: Julio Ohm, PA-C  REFERRING DIAG: DiagnosisD05.11 (ICD-10-CM) - Ductal carcinoma in situ of right breast  THERAPY DIAG:  Ductal carcinoma in situ of right breast  Lymphedema, not elsewhere classified  Acute pain of right shoulder  ONSET DATE: 03/28/23  Rationale for Evaluation and Treatment: Rehabilitation  SUBJECTIVE:                                                                                                                                                                                           SUBJECTIVE STATEMENT: I think the needling helped.  I am a bit more relaxed.    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  S/p Rt lumpectomy without SLNB on 03/28/23. Completed radiation 06/06/23.  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 No not currently  PRECAUTIONS: None  RED FLAGS: None   WEIGHT BEARING RESTRICTIONS: No  FALLS:  Has patient fallen in last 6 months? No  LIVING ENVIRONMENT: Lives with: lives with their family and lives with their spouse  OCCUPATION: full time hairdresser   LEISURE: walk a lot   HAND DOMINANCE: right   PRIOR LEVEL OF FUNCTION: Independent  PATIENT GOALS: decrease the shoulder pain    OBJECTIVE: Note: Objective measures were completed at Evaluation unless otherwise noted.  COGNITION: Overall cognitive status: Within functional limits for tasks assessed   PALPATION: No ttp rotator cuff or biceps, +1 ttp Rt UT more posteriorly, Rt GH AP more stiff compared to the Left  OBSERVATIONS / OTHER ASSESSMENTS: Rt shoulder more protracted and forward in supine and in seated  POSTURE: rounded shoulders   UPPER EXTREMITY AROM/PROM:  A/PROM RIGHT   eval   Shoulder extension 45  Shoulder  flexion 165  Shoulder abduction 165  Shoulder  internal rotation   Shoulder external rotation 80 - pn    (Blank rows = not tested)  A/PROM LEFT   eval  Shoulder extension 60  Shoulder flexion 165  Shoulder abduction 170  Shoulder internal rotation   Shoulder external rotation 90    (Blank rows = not tested)  CERVICAL AROM: All within normal limits:   UPPER EXTREMITY STRENGTH:  5/5 bil pain with resisted flexion and abduction   QUICK DASH SURVEY: Eval: 18%                                                                                                                           TREATMENT DATE:  09/17/23 Pulleys into flex and abd x 2 mins each  Supine over half foam roll for following: Bil UE horz abd x 10, then bil UE scaption into a "V" x 10, and last bil UE abd in a "snow angel" x 5 with 5 sec holds Supine scapular series with yellow theraband x 10 each returning therapist demo for each Supine shoulder flexion with 2# weight on dowel rod 2x10  Supine shoulder chest press with 2# weight on dowel rod 2x10 Standing row yellow x 15  Doorway stretch 2x20"  Manual Therapy STM to Rt pect insertion where pt palpably tight and at Rt axillary scar tissue  09/10/2023: Shoulder pulleys for flexion and abduction x2 min each Supine shoulder flexion with 2# weight on dowel rod 2x10 Supine shoulder chest press with 2# weight on dowel rod 2x10 Supine shoulder ER with red tband 2x10 Supine shoulder horizontal abduction with red tband 2x10 Seated scalene stretch 2x20 sec bilat Trigger Point Dry Needling Initial Treatment: Pt instructed on Dry Needling rational, procedures, and possible side effects. Pt instructed to expect mild to moderate muscle soreness later in the day and/or into the next day.  Pt instructed in methods to reduce muscle soreness. Pt instructed to continue prescribed HEP. Because Dry Needling was performed over or adjacent to a lung field, pt was educated on S/S of  pneumothorax and to seek immediate medical attention should they occur.  Patient was educated on signs and symptoms of infection and other risk factors and advised to seek medical attention should they occur.  Patient verbalized understanding of these instructions and education.  Patient Verbal Consent Given: Yes Education Handout Provided: Yes Muscles Treated: right upper trap, right cervical multifidi, right rhomboids Electrical Stimulation Performed: No Treatment Response/Outcome: Utilized skilled palpation to identify bony landmarks and trigger points.  Able to illicit twitch response and muscle elongation.  Soft tissue mobilization with massage cream following to further promote tissue elongation.   09/03/23: Therapeutic Exercises Pulleys into flex and abd x 2 mins each  Supine over half foam roll for following: Bil UE horz abd x 10, then bil UE scaption into a "V" x 10, and last bil UE abd in a "snow angel" x 5 with 5 sec holds Supine scapular series with yellow theraband x 10  each returning therapist demo for each Manual Therapy P/ROM to Rt shoulder into flex, abd and D2 to tolerance and with scapular depression throughout STM to Rt pect insertion where pt palpably tight and at Rt axillary scar tissue  08/27/23 Eval performed Discussed POC, radiation fibrosis, and reasons for therapy , discussed DN Performed HEP education with pt performing each - see HEP section   PATIENT EDUCATION:  Education details: Supine scapular series with yellow theraband Person educated: Patient Education method: Explanation, Demonstration, Tactile cues, Verbal cues, and Handouts Education comprehension: verbalized understanding, returned demonstration, and needs further education  HOME EXERCISE PROGRAM: Access Code: [email or web address] URL: [email or web address] Date: 09/10/2023 Prepared by: Chaneta Comer Menke  Exercises - Supine Shoulder Flexion Extension AAROM with Dowel  - 1 x daily - 7 x weekly -  2 sets - 10 reps - Supine Shoulder Horizontal Abduction with Resistance  - 1 x daily - 7 x weekly - 2 sets - 10 reps - Supine Shoulder External Rotation with Resistance  - 1 x daily - 7 x weekly - 2 sets - 10 reps - Standing Shoulder Single Arm PNF D2 Flexion with Resistance  - 1 x daily - 7 x weekly - 3 sets - 10 reps - Doorway Pec Stretch at 90 Degrees Abduction  - 1 x daily - 7 x weekly - 2 reps - 20 sec hold - Standing Pec Stretch at Wall  - 1 x daily - 7 x weekly - 2 reps - 20 sec hold - Seated Chest Stretch with Hands Behind Head  - 1 x daily - 7 x weekly - 2 reps - 20 sec hold - Standing Overhead Triceps Stretch  - 1 x daily - 7 x weekly - 2 reps - 20 sec hold - Seated Scalenes Stretch  - 1 x daily - 7 x weekly - 2 reps - 20 sec hold  Supine scapular series  ASSESSMENT:  CLINICAL IMPRESSION: No significant tightness with MT today.  Does note tightness in the pectoralis with end range flexion and abduction.  Continued with POC.    OBJECTIVE IMPAIRMENTS: decreased activity tolerance, decreased knowledge of condition, increased fascial restrictions, and pain.   ACTIVITY LIMITATIONS: carrying, lifting, and reach over head  PARTICIPATION LIMITATIONS: community activity and occupation  PERSONAL FACTORS: 1 comorbidity: radiation hx  are also affecting patient's functional outcome.   REHAB POTENTIAL: Excellent  CLINICAL DECISION MAKING: Stable/uncomplicated  EVALUATION COMPLEXITY: Low  GOALS: Goals reviewed with patient? Yes  SHORT TERM GOALS = LTGs: Target date: 10/08/23  Pt will be ind with final stretches to improve Rt upper quadrant tolerance to work  Baseline: Goal status: Ongoing  2.  Pt will report return to baseline work status including some mild UT and posterior shoulder pain but without Rt axillary pain and stretching  Baseline:  Goal status: INITIAL  3.  Pt will report pain in the arm and chest of 2/10 or less at worst  Baseline: 5/10 Goal status:  INITIAL   PLAN:  PT FREQUENCY: 1x/week due to work   PT DURATION: 6 weeks  PLANNED INTERVENTIONS: 97110-Therapeutic exercises, 97535- Self Care, [id number]- Manual therapy, Patient/Family education, Joint mobilization, Therapeutic exercises, Therapeutic activity, Neuromuscular re-education, Gait training, and Self Care, dry needling   PLAN FOR NEXT SESSION: review exercises, review supine scapular strength for posterior strength, STM Rt pect/lat, DN: UT posterior shoulder blade region (no lymphedema risk)     Judy Null, PT  09/17/23, 10:46 AM  [facility] [address],  [address] [place], [place] [address] Phone # [phone] Fax [phone]

## 2023-09-21 DIAGNOSIS — N942 Vaginismus: Secondary | ICD-10-CM | POA: Diagnosis not present

## 2023-09-24 ENCOUNTER — Encounter

## 2023-09-28 ENCOUNTER — Ambulatory Visit: Attending: Radiology | Admitting: Physical Therapy

## 2023-09-28 DIAGNOSIS — M6281 Muscle weakness (generalized): Secondary | ICD-10-CM | POA: Insufficient documentation

## 2023-09-28 DIAGNOSIS — M25511 Pain in right shoulder: Secondary | ICD-10-CM | POA: Diagnosis not present

## 2023-09-28 DIAGNOSIS — R252 Cramp and spasm: Secondary | ICD-10-CM | POA: Diagnosis not present

## 2023-09-28 DIAGNOSIS — D0511 Intraductal carcinoma in situ of right breast: Secondary | ICD-10-CM | POA: Insufficient documentation

## 2023-09-28 NOTE — Therapy (Signed)
 OUTPATIENT PHYSICAL THERAPY  UPPER EXTREMITY ONCOLOGY TREATMENT  Patient Name: Amy Lang MRN: [id number] DOB:[birth year], 58 y.o., female Today's Date: 09/28/2023  END OF SESSION:  PT End of Session - 09/28/23 1015     Visit Number 5    Number of Visits 7    Date for PT Re-Evaluation 10/08/23    Authorization Type BC/BS Commercial    PT Start Time 1015    PT Stop Time 1058    PT Time Calculation (min) 43 min    Activity Tolerance Patient tolerated treatment well             Past Medical History:  Diagnosis Date   Allergy    Anxiety    Asthma    Depression    Diabetes (HCC)    Dyslipidemia    History of radiation therapy    Right breast-05/08/23-06/06/23- Dr. Retta Caster   Hypertension    IBS (irritable bowel syndrome)    Lactose intolerance    Migraines    Obesity    PONV (postoperative nausea and vomiting)    Shortness of breath    Vaginal cancer (HCC)    Vitamin B 12 deficiency    Vitamin D  deficiency    Past Surgical History:  Procedure Laterality Date   ABDOMINAL HYSTERECTOMY     still has bilateral ovaries   BREAST BIOPSY Right 03/02/2023   MM RT BREAST BX W LOC DEV 1ST LESION IMAGE BX SPEC STEREO GUIDE 03/02/2023 [facility]   BREAST BIOPSY  03/27/2023   MM RT RADIOACTIVE SEED LOC MAMMO GUIDE 03/27/2023 [facility]   BREAST EXCISIONAL BIOPSY Right 08/27/2017   BREAST LUMPECTOMY WITH RADIOACTIVE SEED LOCALIZATION Right 08/28/2017   Procedure: BREAST LUMPECTOMY WITH RADIOACTIVE SEED LOCALIZATION;  Surgeon: Sim Dryer, MD;  Location: [facility];  Service: General;  Laterality: Right;   BREAST LUMPECTOMY WITH RADIOACTIVE SEED LOCALIZATION Right 03/28/2023   Procedure: RIGHT BREAST SEED GUIDED LUMPECTOMY;  Surgeon: Enid Harry, MD;  Location:  SURGERY CENTER;  Service: General;  Laterality: Right;  LMA   CHOLECYSTECTOMY  04-04-11   single site   COLONOSCOPY W/ BIOPSIES  01/24/2013    ESOPHAGOGASTRODUODENOSCOPY     KNEE SURGERY     LAPAROTOMY  05/12/2012   Procedure: LAPAROTOMY;  Surgeon: Martine Sleek, MD;  Location: [facility];  Service: Gynecology;;  laparotomy Roxann Copper salpingectomy-oopharectomy   NM MYOCAR PERF WALL MOTION  09/06/2009   normal   TRANSTHORACIC ECHOCARDIOGRAM  March 2014   Normal regional wall motion. EF 55%. Mild MR   Patient Active Problem List   Diagnosis Date Noted   Ductal carcinoma in situ of right breast 03/19/2023   Fluttering sensation of heart 06/14/2022   GERD (gastroesophageal reflux disease) 12/26/2021   COVID-19 06/07/2021   Acute bronchitis 06/07/2021   Chest pain, atypical 05/24/2021   Flank pain 05/24/2021   Sprain of left ankle 05/02/2021   Peroneal tendinitis 04/01/2021   Plantar fasciitis of left foot 04/01/2021   Insomnia disorder 06/23/2019   Sebaceous cyst 06/23/2019   Prediabetes 03/24/2019   Acute sinusitis 03/18/2018   CFS (chronic fatigue syndrome) 02/06/2018   Exercise-induced asthma 02/06/2018   Type 2 diabetes mellitus without complication, without long-term current use of insulin  (HCC) 02/06/2018   Vitamin D  deficiency 02/06/2018   Metabolic syndrome 12/30/2017   Bilateral carpal tunnel syndrome 12/03/2017   Well adult exam 09/11/2016   Adenopathy 01/13/2015   Trigeminal neuralgia of left side of face 12/07/2014   Abdominal  pain, epigastric 05/14/2014   Nausea with vomiting 05/14/2014   Eye exam abnormal, was told she had bled behind eye 08/22/2013   Obesity (BMI 30-39.9) 02/17/2013   Dizziness 02/17/2013   Family history of coronary artery disease 02/17/2013   IBS (irritable bowel syndrome)    Essential hypertension    Hyperlipidemia    Diarrhea - suspect IBS 12/23/2012   Hematochezia 12/23/2012   B12 deficiency 04/15/2012   Hot flashes 04/15/2012   ALLERGIC RHINITIS 12/02/2009   Asthma 12/02/2009   Anxiety disorder 06/30/2009   Depression 06/30/2009   LIPOMAS, MULTIPLE 06/29/2009   GRIEF  REACTION 06/29/2009   Migraine 06/29/2009   DYSPNEA 06/29/2009    PCP: Adaline Ada, MD  REFERRING PROVIDER: Julio Ohm, PA-C  REFERRING DIAG: DiagnosisD05.11 (ICD-10-CM) - Ductal carcinoma in situ of right breast  THERAPY DIAG:  Acute pain of right shoulder  Ductal carcinoma in situ of right breast  ONSET DATE: 03/28/23  Rationale for Evaluation and Treatment: Rehabilitation  SUBJECTIVE:                                                                                                                                                                                           SUBJECTIVE STATEMENT: States she did fine with the DN, not sore.  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 Pain anterior and superior shoulder with arms out to the sides.    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  S/p Rt lumpectomy without SLNB on 03/28/23. Completed radiation 06/06/23.  Arm has stayed tender from Covid vaccines; when I was 58 I had vaginal cancer in the walls of my vagina HPV  PAIN:  Are you having pain? No not currently but notices with working  PRECAUTIONS: None  RED FLAGS: None   WEIGHT BEARING RESTRICTIONS: No  FALLS:  Has patient fallen in last 6 months? No  LIVING ENVIRONMENT: Lives with: lives with their family and lives with their spouse  OCCUPATION: full time hairdresser   LEISURE: walk a lot   HAND DOMINANCE: right   PRIOR LEVEL OF FUNCTION: Independent  PATIENT GOALS: decrease the shoulder pain    OBJECTIVE: Note: Objective measures were completed at Evaluation unless otherwise noted.  COGNITION: Overall cognitive status: Within functional limits for tasks assessed   PALPATION: No ttp rotator cuff or biceps, +1 ttp Rt UT more posteriorly, Rt GH AP more stiff compared to the Left  OBSERVATIONS / OTHER ASSESSMENTS: Rt shoulder more protracted and forward in supine and in seated  POSTURE:  rounded shoulders   UPPER EXTREMITY AROM/PROM:  A/PROM RIGHT   eval   Shoulder extension 45  Shoulder flexion 165  Shoulder abduction 165  Shoulder internal rotation   Shoulder external rotation 80 - pn    (Blank rows = not tested)  A/PROM LEFT   eval  Shoulder extension 60  Shoulder flexion 165  Shoulder abduction 170  Shoulder internal rotation   Shoulder external rotation 90    (Blank rows = not tested)  CERVICAL AROM: All within normal limits:   UPPER EXTREMITY STRENGTH:  5/5 bil pain with resisted flexion and abduction   QUICK DASH SURVEY: Eval: 18%                                                                                                                           TREATMENT DATE:  09/27/2023: Seated Upper trap stretch 30 sec with manual overpressure 3x (added to HEP) Seated levator scap stretch 30 sec with manual overpressure 3x (added to HEP) Pectoral stretch on wall with head rotation away Doorway flexion stretch 30 sec hold (added to HEP) Trigger Point Dry Needling Subsequent Treatment: Pt instructed on Dry Needling rational, procedures, and possible side effects. Pt instructed to expect mild to moderate muscle soreness later in the day and/or into the next day.  Pt instructed in methods to reduce muscle soreness. Pt instructed to continue prescribed HEP. Because Dry Needling was performed over or adjacent to a lung field, pt was educated on S/S of pneumothorax and to seek immediate medical attention should they occur.  Patient was educated on signs and symptoms of infection and other risk factors and advised to seek medical attention should they occur.  Patient verbalized understanding of these instructions and education.  Patient Verbal Consent Given: Yes Education Handout Provided: Yes Muscles Treated: right upper trap, right levator scap, right supraspinatus, right infraspinatus, right teres, right pectorals (distal insertion) Electrical Stimulation  Performed: No Treatment Response/Outcome: Utilized skilled palpation to identify bony landmarks and trigger points.  Able to illicit twitch response and muscle elongation.  Soft tissue mobilization with massage cream following to further promote tissue elongation. 09/17/23 Pulleys into flex and abd x 2 mins each  Supine over half foam roll for following: Bil UE horz abd x 10, then bil UE scaption into a "V" x 10, and last bil UE abd in a "snow angel" x 5 with 5 sec holds Supine scapular series with yellow theraband x 10 each returning therapist demo for each Supine shoulder flexion with 2# weight on dowel rod 2x10  Supine shoulder chest press with 2# weight on dowel rod 2x10 Standing row yellow x 15  Doorway stretch 2x20"  Manual Therapy STM to Rt pect insertion where pt palpably tight and at Rt axillary scar tissue  09/10/2023: Shoulder pulleys for flexion and abduction x2 min each Supine shoulder flexion with 2# weight on dowel rod 2x10 Supine shoulder chest press with 2# weight on dowel rod 2x10 Supine shoulder ER with red tband 2x10 Supine shoulder  horizontal abduction with red tband 2x10 Seated scalene stretch 2x20 sec bilat Trigger Point Dry Needling Initial Treatment: Pt instructed on Dry Needling rational, procedures, and possible side effects. Pt instructed to expect mild to moderate muscle soreness later in the day and/or into the next day.  Pt instructed in methods to reduce muscle soreness. Pt instructed to continue prescribed HEP. Because Dry Needling was performed over or adjacent to a lung field, pt was educated on S/S of pneumothorax and to seek immediate medical attention should they occur.  Patient was educated on signs and symptoms of infection and other risk factors and advised to seek medical attention should they occur.  Patient verbalized understanding of these instructions and education.  Patient Verbal Consent Given: Yes Education Handout Provided: Yes Muscles  Treated: right upper trap, right cervical multifidi, right rhomboids Electrical Stimulation Performed: No Treatment Response/Outcome: Utilized skilled palpation to identify bony landmarks and trigger points.  Able to illicit twitch response and muscle elongation.  Soft tissue mobilization with massage cream following to further promote tissue elongation.     PATIENT EDUCATION:  Education details: Supine scapular series with yellow theraband Person educated: Patient Education method: Explanation, Demonstration, Tactile cues, Verbal cues, and Handouts Education comprehension: verbalized understanding, returned demonstration, and needs further education  HOME EXERCISE PROGRAM: Access Code: [email or web address] URL: [email or web address] Date: 09/28/2023 Prepared by: Darien Eden  Exercises - Supine Shoulder Flexion Extension AAROM with Dowel  - 1 x daily - 7 x weekly - 2 sets - 10 reps - Supine Shoulder Horizontal Abduction with Resistance  - 1 x daily - 7 x weekly - 2 sets - 10 reps - Supine Shoulder External Rotation with Resistance  - 1 x daily - 7 x weekly - 2 sets - 10 reps - Standing Shoulder Single Arm PNF D2 Flexion with Resistance  - 1 x daily - 7 x weekly - 3 sets - 10 reps - Doorway Pec Stretch at 90 Degrees Abduction  - 1 x daily - 7 x weekly - 2 reps - 20 sec hold - Standing Pec Stretch at Wall  - 1 x daily - 7 x weekly - 2 reps - 20 sec hold - Seated Chest Stretch with Hands Behind Head  - 1 x daily - 7 x weekly - 2 reps - 20 sec hold - Standing Overhead Triceps Stretch  - 1 x daily - 7 x weekly - 2 reps - 20 sec hold - Seated Scalenes Stretch  - 1 x daily - 7 x weekly - 2 reps - 20 sec hold - Seated Cervical Sidebending Stretch  - 1 x daily - 7 x weekly - 1 sets - 3 reps - 30 hold - Seated Levator Scapulae Stretch  - 1 x daily - 7 x weekly - 1 sets - 3 reps - 30 hold - Standing Shoulder Flexion Stretch on Wall  - 1 x daily - 7 x weekly - 1 sets - 3 reps - 30  hold  ASSESSMENT:  CLINICAL IMPRESSION: The patient benefits  from dry needling and manual therapy to stimulate underlying myofascial trigger points and muscular tissue for management of neuromusculoskeletal pain and address movement impairments.  Much improved soft tissue mobility and decreased tender point size and number following treatment session.  The patient was encouraged in regular performance of HEP post DN including soft tissue lengthening and strengthening exercises to enhance long term benefit.     OBJECTIVE IMPAIRMENTS: decreased activity tolerance, decreased knowledge of condition, increased fascial restrictions,  and pain.   ACTIVITY LIMITATIONS: carrying, lifting, and reach over head  PARTICIPATION LIMITATIONS: community activity and occupation  PERSONAL FACTORS: 1 comorbidity: radiation hx  are also affecting patient's functional outcome.   REHAB POTENTIAL: Excellent  CLINICAL DECISION MAKING: Stable/uncomplicated  EVALUATION COMPLEXITY: Low  GOALS: Goals reviewed with patient? Yes  SHORT TERM GOALS = LTGs: Target date: 10/08/23  Pt will be ind with final stretches to improve Rt upper quadrant tolerance to work  Baseline: Goal status: Ongoing  2.  Pt will report return to baseline work status including some mild UT and posterior shoulder pain but without Rt axillary pain and stretching  Baseline:  Goal status: INITIAL  3.  Pt will report pain in the arm and chest of 2/10 or less at worst  Baseline: 5/10 Goal status: INITIAL   PLAN:  PT FREQUENCY: 1x/week due to work   PT DURATION: 6 weeks  PLANNED INTERVENTIONS: 97110-Therapeutic exercises, 97535- Self Care, [id number]- Manual therapy, Patient/Family education, Joint mobilization, Therapeutic exercises, Therapeutic activity, Neuromuscular re-education, Gait training, and Self Care, dry needling   PLAN FOR NEXT SESSION: review exercises, review supine scapular strength for posterior strength, STM Rt  pect/lat, DN: UT posterior shoulder blade region (no lymphedema risk)    Darien Eden, PT 09/28/23 12:16 PM Phone: [phone] Fax: [phone]   [facility] [address], [address] [place], [place] [address] Phone # [phone] Fax [phone]

## 2023-10-01 ENCOUNTER — Ambulatory Visit: Admitting: Rehabilitation

## 2023-10-02 ENCOUNTER — Inpatient Hospital Stay: Attending: Radiation Oncology | Admitting: Adult Health

## 2023-10-02 ENCOUNTER — Encounter: Payer: Self-pay | Admitting: Adult Health

## 2023-10-02 DIAGNOSIS — D0511 Intraductal carcinoma in situ of right breast: Secondary | ICD-10-CM | POA: Diagnosis not present

## 2023-10-02 DIAGNOSIS — Z170 Estrogen receptor positive status [ER+]: Secondary | ICD-10-CM

## 2023-10-02 NOTE — Assessment & Plan Note (Signed)
 03/28/2023: Right lumpectomy: High-grade DCIS with necrosis and calcifications discontinuously involving fibrotic breast tissue, no invasive cancer, margins negative, ER 40%, PR 0%  06/06/2023: Completed adjuvant radiation  Treatment plan: Adjuvant antiestrogen therapy with tamoxifen  5mg  daily  Vaginal dryness/irritation: This is much improved with vitamin E suppositories and she will continue this Continuing tamoxifen  at 5 mg daily Mammogram in September 2025 F/u with Dr. Gudena in 02/2024

## 2023-10-02 NOTE — Progress Notes (Signed)
 [facility] Cancer Follow up:    Plotnikov, Oakley Bellman, MD [address] [place] [address]   DIAGNOSIS:  Cancer Staging  Ductal carcinoma in situ of right breast Staging form: Breast, AJCC 8th Edition - Pathologic stage from 03/28/2023: Stage 0 (pTis (DCIS), pN0, cM0, ER+) - Signed by Percival Brace, NP on 09/17/2023 Stage prefix: Initial diagnosis  I connected with Amy Lang  on 10/02/23 at  9:20 AM EDT by telephone and verified that I am speaking with the correct person using two identifiers.  I discussed the limitations, risks, security and privacy concerns of performing an evaluation and management service by telephone and the availability of in person appointments.  I also discussed with the patient that there may be a patient responsible charge related to this service. The patient expressed understanding and agreed to proceed.  Patient location: Home Provider location: [facility] office  SUMMARY OF ONCOLOGIC HISTORY: Oncology History  Ductal carcinoma in situ of right breast  03/28/2023 Surgery   Right lumpectomy: High-grade DCIS with necrosis and calcifications discontinuously involving fibrotic breast tissue, no invasive cancer, margins negative, ER 40%, PR 0%    03/28/2023 Cancer Staging   Staging form: Breast, AJCC 8th Edition - Pathologic stage from 03/28/2023: Stage 0 (pTis (DCIS), pN0, cM0, ER+) - Signed by Percival Brace, NP on 09/17/2023 Stage prefix: Initial diagnosis   05/09/2023 - 06/06/2023 Radiation Therapy   Site: Breast, Right Technique: 3D Mode: Photon Dose Per Fraction: 2.67 Gy Prescribed Dose (Delivered / Prescribed): 40.05 Gy / 40.05 Gy Prescribed Fxs (Delivered / Prescribed): 15 / 15   Site: Breast, Right - Boost  Technique: 3D Mode: Photon Dose Per Fraction: 2 Gy Prescribed Dose (Delivered / Prescribed): 10 Gy / 10 Gy Prescribed Fxs (Delivered / Prescribed): 5 / 5   05/2023 -  Anti-estrogen oral therapy    Tamoxifen  x 5 years     CURRENT THERAPY:  INTERVAL HISTORY:  Discussed the use of AI scribe software for clinical note transcription with the patient, who gave verbal consent to proceed.  Amy Lang 58 y.o. female returns for follow-up and evaluation after being seen on September 17, 2018 for an struggling with vaginal wetness and discharge that were significantly impacting her quality of life while taking tamoxifen .  Patient is doing better by using vitamin e suppositories and her discharge and irritation has significantly improved.  She would prefer to stay on the tamoxifen  5mg  daily rather than switching to an aromatase inhibitor that can lead to vaginal dryness and atrophy.   Patient Active Problem List   Diagnosis Date Noted   Ductal carcinoma in situ of right breast 03/19/2023   Fluttering sensation of heart 06/14/2022   GERD (gastroesophageal reflux disease) 12/26/2021   COVID-19 06/07/2021   Acute bronchitis 06/07/2021   Chest pain, atypical 05/24/2021   Flank pain 05/24/2021   Sprain of left ankle 05/02/2021   Peroneal tendinitis 04/01/2021   Plantar fasciitis of left foot 04/01/2021   Insomnia disorder 06/23/2019   Sebaceous cyst 06/23/2019   Prediabetes 03/24/2019   Acute sinusitis 03/18/2018   CFS (chronic fatigue syndrome) 02/06/2018   Exercise-induced asthma 02/06/2018   Type 2 diabetes mellitus without complication, without long-term current use of insulin  (HCC) 02/06/2018   Vitamin D  deficiency 02/06/2018   Metabolic syndrome 12/30/2017   Bilateral carpal tunnel syndrome 12/03/2017   Well adult exam 09/11/2016   Adenopathy 01/13/2015   Trigeminal neuralgia of left side of face 12/07/2014  Abdominal pain, epigastric 05/14/2014   Nausea with vomiting 05/14/2014   Eye exam abnormal, was told she had bled behind eye 08/22/2013   Obesity (BMI 30-39.9) 02/17/2013   Dizziness 02/17/2013   Family history of coronary artery disease 02/17/2013   IBS (irritable  bowel syndrome)    Essential hypertension    Hyperlipidemia    Diarrhea - suspect IBS 12/23/2012   Hematochezia 12/23/2012   B12 deficiency 04/15/2012   Hot flashes 04/15/2012   ALLERGIC RHINITIS 12/02/2009   Asthma 12/02/2009   Anxiety disorder 06/30/2009   Depression 06/30/2009   LIPOMAS, MULTIPLE 06/29/2009   GRIEF REACTION 06/29/2009   Migraine 06/29/2009   DYSPNEA 06/29/2009    is allergic to bee pollen, butorphanol, butorphanol tartrate, codeine, codeine sulfate, crestor [rosuvastatin], darvocet [propoxyphene n-acetaminophen ], estrogens, levofloxacin, metformin  and related, penicillamine, penicillins, sulfa antibiotics, and latex.  MEDICAL HISTORY: Past Medical History:  Diagnosis Date   Allergy    Anxiety    Asthma    Depression    Diabetes (HCC)    Dyslipidemia    History of radiation therapy    Right breast-05/08/23-06/06/23- Dr. Retta Caster   Hypertension    IBS (irritable bowel syndrome)    Lactose intolerance    Migraines    Obesity    PONV (postoperative nausea and vomiting)    Shortness of breath    Vaginal cancer (HCC)    Vitamin B 12 deficiency    Vitamin D  deficiency     SURGICAL HISTORY: Past Surgical History:  Procedure Laterality Date   ABDOMINAL HYSTERECTOMY     still has bilateral ovaries   BREAST BIOPSY Right 03/02/2023   MM RT BREAST BX W LOC DEV 1ST LESION IMAGE BX SPEC STEREO GUIDE 03/02/2023 [facility]   BREAST BIOPSY  03/27/2023   MM RT RADIOACTIVE SEED LOC MAMMO GUIDE 03/27/2023 [facility]   BREAST EXCISIONAL BIOPSY Right 08/27/2017   BREAST LUMPECTOMY WITH RADIOACTIVE SEED LOCALIZATION Right 08/28/2017   Procedure: BREAST LUMPECTOMY WITH RADIOACTIVE SEED LOCALIZATION;  Surgeon: Sim Dryer, MD;  Location: [facility];  Service: General;  Laterality: Right;   BREAST LUMPECTOMY WITH RADIOACTIVE SEED LOCALIZATION Right 03/28/2023   Procedure: RIGHT BREAST SEED GUIDED LUMPECTOMY;  Surgeon: Enid Harry, MD;  Location: [facility];  Service: General;  Laterality: Right;  LMA   CHOLECYSTECTOMY  04-04-11   single site   COLONOSCOPY W/ BIOPSIES  01/24/2013   ESOPHAGOGASTRODUODENOSCOPY     KNEE SURGERY     LAPAROTOMY  05/12/2012   Procedure: LAPAROTOMY;  Surgeon: Martine Sleek, MD;  Location: [facility];  Service: Gynecology;;  laparotomy Roxann Copper salpingectomy-oopharectomy   NM MYOCAR PERF WALL MOTION  09/06/2009   normal   TRANSTHORACIC ECHOCARDIOGRAM  March 2014   Normal regional wall motion. EF 55%. Mild MR    SOCIAL HISTORY: Social History   Socioeconomic History   Marital status: Married    Spouse name: Calixta Staebler   Number of children: Not on file   Years of education: Not on file   Highest education level: Not on file  Occupational History   Not on file  Tobacco Use   Smoking status: Never   Smokeless tobacco: Never   Tobacco comments:    Regular Exercise - No  Vaping Use   Vaping status: Never Used  Substance and Sexual Activity   Alcohol use: No   Drug use: No   Sexual activity: Yes    Comment: hysterectomy  Other Topics Concern  Not on file  Social History Narrative   [name] with no children. Never smoked. Occasional alcohol.   Works as a Scientist, research (medical) at [employer]. Is on her feet but 6 days per week.   She does a treadmill at home and walking outside of these 3-4 times a week.   Social Drivers of Corporate investment banker Strain: Not on file  Food Insecurity: No Food Insecurity (03/19/2023)   Hunger Vital Sign    Worried About Running Out of Food in the Last Year: Never true    Ran Out of Food in the Last Year: Never true  Transportation Needs: No Transportation Needs (03/19/2023)   PRAPARE - Administrator, Civil Service (Medical): No    Lack of Transportation (Non-Medical): No  Physical Activity: Not on file  Stress: Not on file  Social Connections: Not on file  Intimate Partner Violence:  Not At Risk (03/19/2023)   Humiliation, Afraid, Rape, and Kick questionnaire    Fear of Current or Ex-Partner: No    Emotionally Abused: No    Physically Abused: No    Sexually Abused: No    FAMILY HISTORY: Family History  Problem Relation Age of Onset   Heart disease Mother    COPD Mother    Heart attack Mother 36   Diabetes Mother    Hypertension Mother    Hyperlipidemia Mother    Kidney disease Mother    Thyroid  disease Mother    Lung cancer Father    Hypertension Father    Hyperlipidemia Father    Heart disease Maternal Grandmother    Breast cancer Maternal Grandmother 62   Heart disease Maternal Grandfather    Heart disease Paternal Grandmother    Heart disease Paternal Grandfather    Colon cancer Neg Hx    Esophageal cancer Neg Hx    Rectal cancer Neg Hx    Stomach cancer Neg Hx     Review of Systems  Constitutional:  Negative for appetite change, chills, fatigue, fever and unexpected weight change.  HENT:   Negative for hearing loss, lump/mass and trouble swallowing.   Eyes:  Negative for eye problems and icterus.  Respiratory:  Negative for chest tightness, cough and shortness of breath.   Cardiovascular:  Negative for chest pain, leg swelling and palpitations.  Gastrointestinal:  Negative for abdominal distention, abdominal pain, constipation, diarrhea, nausea and vomiting.  Endocrine: Negative for hot flashes.  Genitourinary:  Negative for difficulty urinating.   Musculoskeletal:  Negative for arthralgias.  Skin:  Negative for itching and rash.  Neurological:  Negative for dizziness, extremity weakness, headaches and numbness.  Hematological:  Negative for adenopathy. Does not bruise/bleed easily.  Psychiatric/Behavioral:  Negative for depression. The patient is not nervous/anxious.       PHYSICAL EXAMINATION  Patient sounds well, in no apparent distress.  Mood and behavior are normal.    ASSESSMENT and THERAPY PLAN:   Ductal carcinoma in situ of  right breast 03/28/2023: Right lumpectomy: High-grade DCIS with necrosis and calcifications discontinuously involving fibrotic breast tissue, no invasive cancer, margins negative, ER 40%, PR 0%  06/06/2023: Completed adjuvant radiation  Treatment plan: Adjuvant antiestrogen therapy with tamoxifen  5mg  daily  Vaginal dryness/irritation: This is much improved with vitamin E suppositories and she will continue this Continuing tamoxifen  at 5 mg daily Mammogram in September 2025 F/u with Dr. Gudena in 02/2024     The patient was provided an opportunity to ask questions and all were answered. The  patient agreed with the plan and demonstrated an understanding of the instructions.   The patient was advised to call back or seek an in-person evaluation if the symptoms worsen or if the condition fails to improve as anticipated.   I provided 10 minutes of non face-to-face telephone visit time during this encounter, and > 50% was spent counseling as documented under my assessment & plan.    Alwin Baars, NP 10/02/23 9:59 AM Medical Oncology and Hematology [facility] [address] [place], [place] [address] Tel. [phone]    Fax. [phone]  *Total Encounter Time as defined by the Centers for Medicare and Medicaid Services includes, in addition to the face-to-face time of a patient visit (documented in the note above) non-face-to-face time: obtaining and reviewing outside history, ordering and reviewing medications, tests or procedures, care coordination (communications with other health care professionals or caregivers) and documentation in the medical record.

## 2023-10-08 ENCOUNTER — Ambulatory Visit

## 2023-10-08 DIAGNOSIS — M25511 Pain in right shoulder: Secondary | ICD-10-CM

## 2023-10-08 DIAGNOSIS — D0511 Intraductal carcinoma in situ of right breast: Secondary | ICD-10-CM

## 2023-10-08 DIAGNOSIS — R252 Cramp and spasm: Secondary | ICD-10-CM | POA: Diagnosis not present

## 2023-10-08 DIAGNOSIS — M6281 Muscle weakness (generalized): Secondary | ICD-10-CM

## 2023-10-08 NOTE — Therapy (Signed)
 OUTPATIENT PHYSICAL THERAPY  UPPER EXTREMITY ONCOLOGY TREATMENT  Patient Name: Amy Lang MRN: [id number] DOB:09-11-1965, 58 y.o., female Today's Date: 10/08/2023  END OF SESSION:  PT End of Session - 10/08/23 1220     Visit Number 6    Date for PT Re-Evaluation 11/19/23    Authorization Type BC/BS Commercial    PT Start Time 1132    PT Stop Time 1219    PT Time Calculation (min) 47 min    Activity Tolerance Patient tolerated treatment well    Behavior During Therapy [facility] for tasks assessed/performed              Past Medical History:  Diagnosis Date   Allergy    Anxiety    Asthma    Depression    Diabetes (HCC)    Dyslipidemia    History of radiation therapy    Right breast-05/08/23-06/06/23- Dr. Retta Caster   Hypertension    IBS (irritable bowel syndrome)    Lactose intolerance    Migraines    Obesity    PONV (postoperative nausea and vomiting)    Shortness of breath    Vaginal cancer (HCC)    Vitamin B 12 deficiency    Vitamin D  deficiency    Past Surgical History:  Procedure Laterality Date   ABDOMINAL HYSTERECTOMY     still has bilateral ovaries   BREAST BIOPSY Right 03/02/2023   MM RT BREAST BX W LOC DEV 1ST LESION IMAGE BX SPEC STEREO GUIDE 03/02/2023 [facility]   BREAST BIOPSY  03/27/2023   MM RT RADIOACTIVE SEED LOC MAMMO GUIDE 03/27/2023 [facility]   BREAST EXCISIONAL BIOPSY Right 08/27/2017   BREAST LUMPECTOMY WITH RADIOACTIVE SEED LOCALIZATION Right 08/28/2017   Procedure: BREAST LUMPECTOMY WITH RADIOACTIVE SEED LOCALIZATION;  Surgeon: Sim Dryer, MD;  Location: [facility];  Service: General;  Laterality: Right;   BREAST LUMPECTOMY WITH RADIOACTIVE SEED LOCALIZATION Right 03/28/2023   Procedure: RIGHT BREAST SEED GUIDED LUMPECTOMY;  Surgeon: Enid Harry, MD;  [facility];  Service: General;  Laterality: Right;  LMA   CHOLECYSTECTOMY  04-04-11   single site   COLONOSCOPY W/  BIOPSIES  01/24/2013   ESOPHAGOGASTRODUODENOSCOPY     KNEE SURGERY     LAPAROTOMY  05/12/2012   Procedure: LAPAROTOMY;  Surgeon: Martine Sleek, MD;  Location: [facility];  Service: Gynecology;;  laparotomy Roxann Copper salpingectomy-oopharectomy   NM MYOCAR PERF WALL MOTION  09/06/2009   normal   TRANSTHORACIC ECHOCARDIOGRAM  March 2014   Normal regional wall motion. EF 55%. Mild MR   Patient Active Problem List   Diagnosis Date Noted   Ductal carcinoma in situ of right breast 03/19/2023   Fluttering sensation of heart 06/14/2022   GERD (gastroesophageal reflux disease) 12/26/2021   COVID-19 06/07/2021   Acute bronchitis 06/07/2021   Chest pain, atypical 05/24/2021   Flank pain 05/24/2021   Sprain of left ankle 05/02/2021   Peroneal tendinitis 04/01/2021   Plantar fasciitis of left foot 04/01/2021   Insomnia disorder 06/23/2019   Sebaceous cyst 06/23/2019   Prediabetes 03/24/2019   Acute sinusitis 03/18/2018   CFS (chronic fatigue syndrome) 02/06/2018   Exercise-induced asthma 02/06/2018   Type 2 diabetes mellitus without complication, without long-term current use of insulin  (HCC) 02/06/2018   Vitamin D  deficiency 02/06/2018   Metabolic syndrome 12/30/2017   Bilateral carpal tunnel syndrome 12/03/2017   Well adult exam 09/11/2016   Adenopathy 01/13/2015   Trigeminal neuralgia of left side of face  12/07/2014   Abdominal pain, epigastric 05/14/2014   Nausea with vomiting 05/14/2014   Eye exam abnormal, was told she had bled behind eye 08/22/2013   Obesity (BMI 30-39.9) 02/17/2013   Dizziness 02/17/2013   Family history of coronary artery disease 02/17/2013   IBS (irritable bowel syndrome)    Essential hypertension    Hyperlipidemia    Diarrhea - suspect IBS 12/23/2012   Hematochezia 12/23/2012   B12 deficiency 04/15/2012   Hot flashes 04/15/2012   ALLERGIC RHINITIS 12/02/2009   Asthma 12/02/2009   Anxiety disorder 06/30/2009   Depression 06/30/2009   LIPOMAS, MULTIPLE  06/29/2009   GRIEF REACTION 06/29/2009   Migraine 06/29/2009   DYSPNEA 06/29/2009    PCP: Adaline Ada, MD  REFERRING PROVIDER: Julio Ohm, PA-C  REFERRING DIAG: DiagnosisD05.11 (ICD-10-CM) - Ductal carcinoma in situ of right breast  THERAPY DIAG:  Acute pain of right shoulder - Plan: PT plan of care cert/re-cert  Ductal carcinoma in situ of right breast - Plan: PT plan of care cert/re-cert  Cramp and spasm - Plan: PT plan of care cert/re-cert  Muscle weakness (generalized) - Plan: PT plan of care cert/re-cert  ONSET DATE: 03/28/23  Rationale for Evaluation and Treatment: Rehabilitation  SUBJECTIVE:                                                                                                                                                                                           SUBJECTIVE STATEMENT: 50-60% overall improvement in symptoms since the start of care.   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  S/p Rt lumpectomy without SLNB on 03/28/23. Completed radiation 06/06/23.  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 No not currently but notices with working 0-4/10 Rt upper quarter  Work aggravates, Tylenol  relieves  PRECAUTIONS: None  RED FLAGS: None   WEIGHT BEARING RESTRICTIONS: No  FALLS:  Has patient fallen in last 6 months? No  LIVING ENVIRONMENT: Lives with: lives with their family and lives with their spouse  OCCUPATION: full time hairdresser   LEISURE: walk a lot   HAND DOMINANCE: right   PRIOR LEVEL OF FUNCTION: Independent  PATIENT GOALS: decrease the shoulder pain    OBJECTIVE: Note: Objective measures were completed at Evaluation unless otherwise noted.  COGNITION: Overall cognitive status: Within functional limits for tasks assessed   PALPATION: No ttp rotator cuff or biceps, +1 ttp Rt UT more  posteriorly, Rt GH AP more stiff compared to the Left  OBSERVATIONS /  OTHER ASSESSMENTS: Rt shoulder more protracted and forward in supine and in seated  POSTURE: rounded shoulders   UPPER EXTREMITY AROM/PROM:  A/PROM RIGHT   eval   Shoulder extension 45  Shoulder flexion 165  Shoulder abduction 165  Shoulder internal rotation   Shoulder external rotation 80 - pn    (Blank rows = not tested)  A/PROM LEFT   eval  Shoulder extension 60  Shoulder flexion 165  Shoulder abduction 170  Shoulder internal rotation   Shoulder external rotation 90    (Blank rows = not tested)  CERVICAL AROM: All within normal limits:   UPPER EXTREMITY STRENGTH:  5/5 bil pain with resisted flexion and abduction   QUICK DASH SURVEY: Eval: 18%                                                                                                                           TREATMENT DATE:  10/08/2023: Pulleys into flex and abd x 2 mins each  Supine over half foam roll for following: Bil UE horz abd x 10, then bil UE scaption into a "V" x 10, and last bil UE abd in a "snow angel" x 5 with 5 sec holds Trigger Point Dry Needling Subsequent Treatment: Pt instructed on Dry Needling rational, procedures, and possible side effects. Pt instructed to expect mild to moderate muscle soreness later in the day and/or into the next day.  Pt instructed in methods to reduce muscle soreness. Pt instructed to continue prescribed HEP. Because Dry Needling was performed over or adjacent to a lung field, pt was educated on S/S of pneumothorax and to seek immediate medical attention should they occur.  Patient was educated on signs and symptoms of infection and other risk factors and advised to seek medical attention should they occur.  Patient verbalized understanding of these instructions and education.  Patient Verbal Consent Given: Yes Education Handout Provided: Yes Muscles Treated: right upper trap, right levator scap,  right supraspinatus, right infraspinatus, right teres, right pectorals (distal insertion) Electrical Stimulation Performed: No Treatment Response/Outcome: Utilized skilled palpation to identify bony landmarks and trigger points.  Able to illicit twitch response and muscle elongation.  Soft tissue mobilization with massage cream following to further promote tissue elongation.  09/27/2023: Seated Upper trap stretch 30 sec with manual overpressure 3x (added to HEP) Seated levator scap stretch 30 sec with manual overpressure 3x (added to HEP) Pectoral stretch on wall with head rotation away Doorway flexion stretch 30 sec hold (added to HEP) Trigger Point Dry Needling Subsequent Treatment: Pt instructed on Dry Needling rational, procedures, and possible side effects. Pt instructed to expect mild to moderate muscle soreness later in the day and/or into the next day.  Pt instructed in methods to reduce muscle soreness. Pt instructed to continue prescribed HEP. Because Dry Needling was performed over or adjacent to a lung field, pt was educated on S/S of pneumothorax and to seek immediate medical attention should they occur.  Patient  was educated on signs and symptoms of infection and other risk factors and advised to seek medical attention should they occur.  Patient verbalized understanding of these instructions and education.  Patient Verbal Consent Given: Yes Education Handout Provided: Yes Muscles Treated: right upper trap, right levator scap, right supraspinatus, right infraspinatus, right teres, right pectorals (distal insertion) Electrical Stimulation Performed: No Treatment Response/Outcome: Utilized skilled palpation to identify bony landmarks and trigger points.  Able to illicit twitch response and muscle elongation.  Soft tissue mobilization with massage cream following to further promote tissue elongation. 09/17/23 Pulleys into flex and abd x 2 mins each  Supine over half foam roll for  following: Bil UE horz abd x 10, then bil UE scaption into a "V" x 10, and last bil UE abd in a "snow angel" x 5 with 5 sec holds Supine scapular series with yellow theraband x 10 each returning therapist demo for each Supine shoulder flexion with 2# weight on dowel rod 2x10  Supine shoulder chest press with 2# weight on dowel rod 2x10 Standing row yellow x 15  Doorway stretch 2x20"  Manual Therapy STM to Rt pect insertion where pt palpably tight and at Rt axillary scar tissue    PATIENT EDUCATION:  Education details: Supine scapular series with yellow theraband Person educated: Patient Education method: Explanation, Demonstration, Tactile cues, Verbal cues, and Handouts Education comprehension: verbalized understanding, returned demonstration, and needs further education  HOME EXERCISE PROGRAM: Access Code: [email or web address] URL: [email or web address] Date: 09/28/2023 Prepared by: Darien Eden  Exercises - Supine Shoulder Flexion Extension AAROM with Dowel  - 1 x daily - 7 x weekly - 2 sets - 10 reps - Supine Shoulder Horizontal Abduction with Resistance  - 1 x daily - 7 x weekly - 2 sets - 10 reps - Supine Shoulder External Rotation with Resistance  - 1 x daily - 7 x weekly - 2 sets - 10 reps - Standing Shoulder Single Arm PNF D2 Flexion with Resistance  - 1 x daily - 7 x weekly - 3 sets - 10 reps - Doorway Pec Stretch at 90 Degrees Abduction  - 1 x daily - 7 x weekly - 2 reps - 20 sec hold - Standing Pec Stretch at Wall  - 1 x daily - 7 x weekly - 2 reps - 20 sec hold - Seated Chest Stretch with Hands Behind Head  - 1 x daily - 7 x weekly - 2 reps - 20 sec hold - Standing Overhead Triceps Stretch  - 1 x daily - 7 x weekly - 2 reps - 20 sec hold - Seated Scalenes Stretch  - 1 x daily - 7 x weekly - 2 reps - 20 sec hold - Seated Cervical Sidebending Stretch  - 1 x daily - 7 x weekly - 1 sets - 3 reps - 30 hold - Seated Levator Scapulae Stretch  - 1 x daily - 7 x weekly - 1  sets - 3 reps - 30 hold - Standing Shoulder Flexion Stretch on Wall  - 1 x daily - 7 x weekly - 1 sets - 3 reps - 30 hold  ASSESSMENT:  CLINICAL IMPRESSION: Pt reports 50-60% overall reduction in pain since the start of care.  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  She is working to [employer] and strengthen to maximize her function with these tasks.  Good response to dry needling with improved tissue mobility and twitch response and  PT added Lt treatment today.  Patient will benefit from skilled PT to address the below impairments and improve overall function.     OBJECTIVE IMPAIRMENTS: decreased activity tolerance, decreased knowledge of condition, increased fascial restrictions, and pain.   ACTIVITY LIMITATIONS: carrying, lifting, and reach over head  PARTICIPATION LIMITATIONS: community activity and occupation  PERSONAL FACTORS: 1 comorbidity: radiation hx are also affecting patient's functional outcome.   REHAB POTENTIAL: Excellent  CLINICAL DECISION MAKING: Stable/uncomplicated  EVALUATION COMPLEXITY: Low  GOALS: Goals reviewed with patient? Yes  SHORT TERM GOALS = LTGs: Target date: 11/19/2023     Pt will be ind with final stretches to improve Rt upper quadrant tolerance to work  Baseline: Goal status: Ongoing  2.  Pt will report return to baseline work status including some mild UT and posterior shoulder pain but without Rt axillary pain and stretching  Baseline:  Goal status: in progress   3.  Pt will report pain in the arm and chest of 2/10 or less at worst  Baseline: 0-4/10 (10/07/13 Goal status: in progress  4. Report > or = to 75% reduction in Rt UE pain with work tasks   Baseline: 50-60% reduction  Goal Status: INITIAL  PLAN:  PT FREQUENCY: 1x/week due to work   PT DURATION: 6 weeks  PLANNED INTERVENTIONS: 97110-Therapeutic exercises, 97535- Self Care, [id number]- Manual therapy, Patient/Family education, Joint  mobilization, Therapeutic exercises, Therapeutic activity, Neuromuscular re-education, Gait training, and Self Care, dry needling   PLAN FOR NEXT SESSION: Pt will see ortho for dry needling for up to 4 weeks and will start pelvic floor in June.  Advance HEP as needed   Luella Sager, PT 10/08/23 12:26 PM    [facility] [address], [address] [place], [place] [address] Phone # [phone] Fax [phone]

## 2023-10-30 ENCOUNTER — Telehealth: Payer: Self-pay

## 2023-10-30 ENCOUNTER — Other Ambulatory Visit (HOSPITAL_COMMUNITY): Payer: Self-pay

## 2023-10-30 NOTE — Telephone Encounter (Signed)
 Pharmacy Patient Advocate Encounter   Received notification from CoverMyMeds that prior authorization for Ozempic  2 is required/requested.   Insurance verification completed.   The patient is insured through [facility] ADVANTAGE/RX ADVANCE .   Per test claim: PA required; PA submitted to above mentioned insurance via CoverMyMeds Key/confirmation #/EOC [id number] Status is pending

## 2023-11-01 ENCOUNTER — Other Ambulatory Visit (HOSPITAL_COMMUNITY): Payer: Self-pay

## 2023-11-01 NOTE — Telephone Encounter (Addendum)
 Noted.  Thanks    Pharmacy Patient Advocate Encounter  Received notification from [facility] ADVANTAGE/RX ADVANCE that Prior Authorization for Ozempic  4 has been APPROVED from 10/29/23 to 10/29/24. Ran test claim, Copay is $964.07 due to patient deductible. This test claim was processed through [facility]- copay amounts may vary at other pharmacies due to pharmacy/plan contracts, or as the patient moves through the different stages of their insurance plan.   PA #/Case ID/Reference #: [id number]

## 2023-11-05 ENCOUNTER — Ambulatory Visit

## 2023-11-08 NOTE — Telephone Encounter (Signed)
 Called and spoke with patient. Informed her of copay amount and she informed me that if this was the case she would not be able to keep the ozempic  as her type 2 diabetes therapy med. She will contact insurance as well

## 2023-11-09 ENCOUNTER — Other Ambulatory Visit: Payer: Self-pay

## 2023-11-09 ENCOUNTER — Encounter: Payer: Self-pay | Admitting: Physical Therapy

## 2023-11-09 ENCOUNTER — Ambulatory Visit: Attending: Obstetrics and Gynecology | Admitting: Physical Therapy

## 2023-11-09 DIAGNOSIS — M6281 Muscle weakness (generalized): Secondary | ICD-10-CM | POA: Diagnosis not present

## 2023-11-09 DIAGNOSIS — M25511 Pain in right shoulder: Secondary | ICD-10-CM | POA: Diagnosis not present

## 2023-11-09 DIAGNOSIS — D0511 Intraductal carcinoma in situ of right breast: Secondary | ICD-10-CM | POA: Diagnosis not present

## 2023-11-09 DIAGNOSIS — R252 Cramp and spasm: Secondary | ICD-10-CM | POA: Diagnosis not present

## 2023-11-09 NOTE — Therapy (Addendum)
 OUTPATIENT PHYSICAL THERAPY FEMALE PELVIC EVALUATION/ discharge   Patient Name: Amy Lang MRN: [id number] DOB:02/21/1966, 58 y.o., female Today's Date: 11/09/2023  END OF SESSION:  PT End of Session - 11/09/23 0949     Visit Number 7    Authorization Type BCBS [id number]  No Auth Required  $40 Copay  VL- 60 visits- pelvic    PT Start Time 0940    PT Stop Time 1047    PT Time Calculation (min) 67 min    Activity Tolerance Patient tolerated treatment well    Behavior During Therapy WFL for tasks assessed/performed;Anxious          Past Medical History:  Diagnosis Date   Allergy    Anxiety    Asthma    Depression    Diabetes (HCC)    Dyslipidemia    History of radiation therapy    Right breast-05/08/23-06/06/23- Dr. Lynwood Nasuti   Hypertension    IBS (irritable bowel syndrome)    Lactose intolerance    Migraines    Obesity    PONV (postoperative nausea and vomiting)    Shortness of breath    Vaginal cancer (HCC)    Vitamin B 12 deficiency    Vitamin D  deficiency    Past Surgical History:  Procedure Laterality Date   ABDOMINAL HYSTERECTOMY     still has bilateral ovaries   BREAST BIOPSY Right 03/02/2023   MM RT BREAST BX W LOC DEV 1ST LESION IMAGE BX SPEC STEREO GUIDE 03/02/2023 [facility]   BREAST BIOPSY  03/27/2023   MM RT RADIOACTIVE SEED LOC MAMMO GUIDE 03/27/2023 [facility]   BREAST EXCISIONAL BIOPSY Right 08/27/2017   BREAST LUMPECTOMY WITH RADIOACTIVE SEED LOCALIZATION Right 08/28/2017   Procedure: BREAST LUMPECTOMY WITH RADIOACTIVE SEED LOCALIZATION;  Surgeon: Vanderbilt Ned, MD;  Location: [facility];  Service: General;  Laterality: Right;   BREAST LUMPECTOMY WITH RADIOACTIVE SEED LOCALIZATION Right 03/28/2023   Procedure: RIGHT BREAST SEED GUIDED LUMPECTOMY;  Surgeon: Ebbie Cough, MD;  Location: [facility];  Service: General;  Laterality: Right;  LMA   CHOLECYSTECTOMY  04-04-11   single site   COLONOSCOPY  W/ BIOPSIES  01/24/2013   ESOPHAGOGASTRODUODENOSCOPY     KNEE SURGERY     LAPAROTOMY  05/12/2012   Procedure: LAPAROTOMY;  Surgeon: Rosaline LITTIE Cobble, MD;  Location: [facility];  Service: Gynecology;;  laparotomy Joe salpingectomy-oopharectomy   NM MYOCAR PERF WALL MOTION  09/06/2009   normal   TRANSTHORACIC ECHOCARDIOGRAM  March 2014   Normal regional wall motion. EF 55%. Mild MR   Patient Active Problem List   Diagnosis Date Noted   Ductal carcinoma in situ of right breast 03/19/2023   Fluttering sensation of heart 06/14/2022   GERD (gastroesophageal reflux disease) 12/26/2021   COVID-19 06/07/2021   Acute bronchitis 06/07/2021   Chest pain, atypical 05/24/2021   Flank pain 05/24/2021   Sprain of left ankle 05/02/2021   Peroneal tendinitis 04/01/2021   Plantar fasciitis of left foot 04/01/2021   Insomnia disorder 06/23/2019   Sebaceous cyst 06/23/2019   Prediabetes 03/24/2019   Acute sinusitis 03/18/2018   CFS (chronic fatigue syndrome) 02/06/2018   Exercise-induced asthma 02/06/2018   Type 2 diabetes mellitus without complication, without long-term current use of insulin  (HCC) 02/06/2018   Vitamin D  deficiency 02/06/2018   Metabolic syndrome 12/30/2017   Bilateral carpal tunnel syndrome 12/03/2017   Well adult exam 09/11/2016   Adenopathy 01/13/2015   Trigeminal neuralgia of left side of face  12/07/2014   Abdominal pain, epigastric 05/14/2014   Nausea with vomiting 05/14/2014   Eye exam abnormal, was told she had bled behind eye 08/22/2013   Obesity (BMI 30-39.9) 02/17/2013   Dizziness 02/17/2013   Family history of coronary artery disease 02/17/2013   IBS (irritable bowel syndrome)    Essential hypertension    Hyperlipidemia    Diarrhea - suspect IBS 12/23/2012   Hematochezia 12/23/2012   B12 deficiency 04/15/2012   Hot flashes 04/15/2012   ALLERGIC RHINITIS 12/02/2009   Asthma 12/02/2009   Anxiety disorder 06/30/2009   Depression 06/30/2009   LIPOMAS,  MULTIPLE 06/29/2009   GRIEF REACTION 06/29/2009   Migraine 06/29/2009   DYSPNEA 06/29/2009   PCP: Garald [name], MD PCP - General  REFERRING PROVIDER: Mat Browning, MD  REFERRING DIAG: N94.10 (ICD-10-CM) - Unspecified dyspareunia  THERAPY DIAG:  Cramp and spasm  Muscle weakness (generalized)  Rationale for Evaluation and Treatment: Rehabilitation  ONSET DATE: 18 years ago  SUBJECTIVE:                                                                                                                                                                                           SUBJECTIVE STATEMENT: Pt reports that she had cancer in the walls of her vagina and had Almetta Planas done 7 times and it did not help She reports that she has done everything, all types of creams and everything.  Had breast cancer, radiation finished in January. She has had hard time with right shoulder and has hot sensations. Has been coming to PT for dry needling and breast cancer.  She was treated with chemo for 3 weeks every day for 12 hrs/ day. She had hysterectomy 58 yo , ovaries at 58 yo.  She has dilators from dr Celesta- she gave them to her 8-9 years ago and pt tried to use them but it was painful Pt reports that tissue tears with pelvic exam, she speculum hurts, she bleeds for 3 days.  She went to see Dr Celesta 5 weeks ago, she recommended coconut oil, pt doing some Aquafor as well at times.     PAIN:  Are you having pain? Yes right shoulder, right breast at times- burning pain, sees PT for dry needling  NPRS scale: 5/10 Pain location: Internal, Deep, Vaginal, and superficial with pelvic exams  Pain type: burning Pain description: burning   Aggravating factors: pelvic exams, dilators in the past Relieving factors: no pelvic exams  PRECAUTIONS: None  RED FLAGS: None   WEIGHT BEARING RESTRICTIONS: No  FALLS:  Has patient fallen in last 6 months? No  OCCUPATION:  hairdresser  ACTIVITY LEVEL : active, on her feet at work all day  PLOF: Independent  PATIENT GOALS: to have pain free intercourse again  PERTINENT HISTORY:  Endometriosis Hysterectomy Breast cancer Vaginal cancer  Sexual abuse: No  BOWEL MOVEMENT: no issues   URINATION: no issues  INTERCOURSE: has not had intercourse in 4 years, trying to put a suppository is an act of congress- vitamin E   Ability to have vaginal penetration No  Pain with intercourse: unable  DrynessYes  Climax: no- cannot stand to touch it Marinoff Scale:  Laxative:  PREGNANCY: no pregnancies, no deliveries   PROLAPSE: None   OBJECTIVE:  Note: Objective measures were completed at Evaluation unless otherwise noted.  DIAGNOSTIC FINDINGS:  None recently  PATIENT SURVEYS:    POPIQ-7 19  COGNITION: Overall cognitive status: Within functional limits for tasks assessed     SENSATION: Light touch: Appears intact  LUMBAR SPECIAL TESTS:  Straight leg raise test: Positive for hamstring tightness more on Lt  GAIT: Assistive device utilized: None Comments: within functional limitations   POSTURE: No Significant postural limitations, rounded shoulders, and forward head   LUMBARAROM/PROM: full   LOWER EXTREMITY ROM: bilateral hamstring tightness, otherwise within functional limitations    LOWER EXTREMITY MMT: 4/5 bilateral hips within functional limitations  PALPATION:   General: upper chest breathing strategies  Pelvic Alignment: even  Abdominal: some abdominal umbilical scar tightness                External Perineal Exam: mild dryness present                             Internal Pelvic Floor: very tight and tender superficial layers- bulbocavernosus bilaterally, able to insert index finger internally all the way to the end of vaginal canal  pt reported 4/10 pain and burning initially- very tight introitus   Patient confirms identification and approves PT to assess internal  pelvic floor and treatment Yes Patient confirms identification and approves PT to assess internal pelvic floor and treatment Yes No emotional/communication barriers or cognitive limitation. Patient is motivated to learn. Patient understands and agrees with treatment goals and plan. PT explains patient will be examined in standing, sitting, and lying down to see how their muscles and joints work. When they are ready, they will be asked to remove their underwear so PT can examine their perineum. The patient is also given the option of providing their own chaperone as one is not provided in our facility. The patient also has the right and is explained the right to defer or refuse any part of the evaluation or treatment including the internal exam. With the patient's consent, PT will use one gloved finger to gently assess the muscles of the pelvic floor, seeing how well it contracts and relaxes and if there is muscle symmetry. After, the patient will get dressed and PT and patient will discuss exam findings and plan of care. PT and patient discuss plan of care, schedule, attendance policy and HEP activities.     PELVIC MMT:   MMT eval  Vaginal 3/5  Internal Anal Sphincter   External Anal Sphincter   Puborectalis   Diastasis Recti 1 finger throughout  (Blank rows = not tested)        TONE: Very high  PROLAPSE: None noticed  TODAY'S TREATMENT:  DATE: 11/09/2023  EVAL EVAL Examination completed, findings reviewed, pt educated on POC, HEP, and female pelvic floor anatomy, reasoning with pelvic floor assessment internally with pt consent. Pt motivated to participate in PT and agreeable to attempt recommendations.     PATIENT EDUCATION/ there acts:   Education details: Pt was educated on relevant anatomy, exam findings, HEP, expectations of PT, lubricants and vaginal  moisturizers, slippery stuff, samples of vulva balm and good clean love given, Educated on vulvovaginal massage and dilators from intimate rose or similar- the smallest set to start with   Person educated: Patient Education method: Explanation, Demonstration, Tactile cues, Verbal cues, and Handouts Education comprehension: verbalized understanding, returned demonstration, verbal cues required, tactile cues required, and needs further education  HOME EXERCISE PROGRAM: Access Code: [email or web address] URL: [email or web address] Date: 11/09/2023 Prepared by: Cori Destine Ambroise  Exercises - Seated Hamstring Stretch  - 1 x daily - 7 x weekly - 3 sets - 10 reps - Diaphragmatic Breathing in Child's Pose with Pelvic Floor Relaxation  - 1 x daily - 7 x weekly - 3 sets - 10 reps - Diaphragmatic Breathing at 90/90 Supported  - 1 x daily - 7 x weekly - 3 sets - 10 reps - Yoga Squat for Pelvic Floor Relaxation  - 1 x daily - 7 x weekly - 3 sets - 10 reps - Cat Cow  - 1 x daily - 7 x weekly - 3 sets - 10 reps - Supine Lower Trunk Rotation  - 1 x daily - 7 x weekly - 3 sets - 10 reps  ASSESSMENT:  CLINICAL IMPRESSION: Patient is a 58  y.o. F who was seen today for physical therapy evaluation and treatment for dyspareunia. She has tight and tender pelvic floor - superficial muscles the most. Some stinging and burning with gentle internal assessment. She has bilateral hamstring tightness and somewhat restricted abdominal scar ( afire 4 endo surgeries) as well. She did well with education on lubricants, moisturizers, dilators and vulvovaginal massage to reduce tightness and pain with intercourse. Pt has not been sexually active with her husband for 4 years and has had pain and tightness in her vaginal for 12 since her cancer treatment. She will benefit from PT to reduce dyspareunia.   OBJECTIVE IMPAIRMENTS: decreased knowledge of use of DME, decreased ROM, increased muscle spasms, and pain.   ACTIVITY  LIMITATIONS: intercourse  PARTICIPATION LIMITATIONS: interpersonal relationship  PERSONAL FACTORS: Time since onset of injury/illness/exacerbation are also affecting patient's functional outcome.   REHAB POTENTIAL: Good  CLINICAL DECISION MAKING: Evolving/moderate complexity  EVALUATION COMPLEXITY: Moderate   GOALS: Goals reviewed with patient? Yes  SHORT TERM GOALS: Target date: 12/07/2023    Pt will be independent with dilator program and progressions to decrease vaginal sensitization and return to pain free vaginal penetration.    Baseline: Goal status: INITIAL  2.  Pt will be independent with HEP.   Baseline:  Goal status: INITIAL  LONG TERM GOALS: Target date: 05/10/2024  Pt will report 0/10 pain with vaginal penetration in order to improve intimate relationship with husband.    Baseline:  Goal status: INITIAL  2.  Pt will be independent with advanced HEP.   Baseline:  Goal status: INITIAL  3.  Pt will be educated on how to massage her pelvic floor muscles in order to work on decreased pain with intercourse             Baseline:  Goal status: INITIAL  4.  Pt will be educated on  vaginal moisturizers to improve vaginal tissue health and improve lengthening of the tissue for reduced pain with intercourse  Baseline:  Goal status: INITIAL  5.  Pt will report reduced PFIQ to max 10 Baseline: 19 Goal status: INITIAL    PLAN:  PT FREQUENCY: 1/ week  PT DURATION: 6 months  PLANNED INTERVENTIONS: 97110-Therapeutic exercises, 97530- Therapeutic activity, 97112- Neuromuscular re-education, 97535- Self Care, [id number]- Manual therapy, 20560 (1-2 muscles), 20561 (3+ muscles)- Dry Needling, Patient/Family education, Taping, Joint mobilization, Joint manipulation, Spinal manipulation, Spinal mobilization, Scar mobilization, Cryotherapy, Moist heat, and Biofeedback  PLAN FOR NEXT SESSION: give pt her exercise printout, cont exercises, dilator protocol, cont  downtraining   Maryan Sivak, PT 11/09/2023, 10:52 AM    PHYSICAL THERAPY DISCHARGE SUMMARY  Visits from Start of Care: 1   Patient agrees to discharge. Patient goals were not sure if met. Patient is being discharged due to the patient's request. Patient has a herniated disc and treating that.                                   02/25/2024 Cori Florida, PT, DPT  [facility] [address] [place], [place] [address] Phone # [phone] Fax [phone]

## 2023-11-14 ENCOUNTER — Encounter: Admitting: Physical Therapy

## 2023-11-15 ENCOUNTER — Other Ambulatory Visit: Payer: Self-pay | Admitting: Internal Medicine

## 2023-11-19 ENCOUNTER — Encounter: Payer: Self-pay | Admitting: Physical Therapy

## 2023-11-19 ENCOUNTER — Ambulatory Visit: Admitting: Physical Therapy

## 2023-11-19 DIAGNOSIS — R252 Cramp and spasm: Secondary | ICD-10-CM

## 2023-11-19 DIAGNOSIS — M25511 Pain in right shoulder: Secondary | ICD-10-CM | POA: Diagnosis not present

## 2023-11-19 DIAGNOSIS — M6281 Muscle weakness (generalized): Secondary | ICD-10-CM

## 2023-11-19 DIAGNOSIS — D0511 Intraductal carcinoma in situ of right breast: Secondary | ICD-10-CM | POA: Diagnosis not present

## 2023-11-19 NOTE — Therapy (Signed)
 OUTPATIENT PHYSICAL THERAPY  UPPER EXTREMITY ONCOLOGY TREATMENT/ RE-CERTIFICATION   Patient Name: Amy Lang MRN: [id number] DOB:02/05/1966, 58 y.o., female Today's Date: 11/19/2023  END OF SESSION:  PT End of Session - 11/19/23 1028     Visit Number 8    Date for PT Re-Evaluation 12/24/23    Authorization Type BCBS [id number]  No Auth Required  $40 Copay  VL- 60 visits- pelvic    PT Start Time 0932    PT Stop Time 1013    PT Time Calculation (min) 41 min    Activity Tolerance Patient tolerated treatment well    Behavior During Therapy WFL for tasks assessed/performed            Past Medical History:  Diagnosis Date   Allergy    Anxiety    Asthma    Depression    Diabetes (HCC)    Dyslipidemia    History of radiation therapy    Right breast-05/08/23-06/06/23- Dr. Lynwood Nasuti   Hypertension    IBS (irritable bowel syndrome)    Lactose intolerance    Migraines    Obesity    PONV (postoperative nausea and vomiting)    Shortness of breath    Vaginal cancer (HCC)    Vitamin B 12 deficiency    Vitamin D  deficiency    Past Surgical History:  Procedure Laterality Date   ABDOMINAL HYSTERECTOMY     still has bilateral ovaries   BREAST BIOPSY Right 03/02/2023   MM RT BREAST BX W LOC DEV 1ST LESION IMAGE BX SPEC STEREO GUIDE 03/02/2023 [facility]   BREAST BIOPSY  03/27/2023   MM RT RADIOACTIVE SEED LOC MAMMO GUIDE 03/27/2023 [facility]   BREAST EXCISIONAL BIOPSY Right 08/27/2017   BREAST LUMPECTOMY WITH RADIOACTIVE SEED LOCALIZATION Right 08/28/2017   Procedure: BREAST LUMPECTOMY WITH RADIOACTIVE SEED LOCALIZATION;  Surgeon: Vanderbilt Ned, MD;  Location: [facility];  Service: General;  Laterality: Right;   BREAST LUMPECTOMY WITH RADIOACTIVE SEED LOCALIZATION Right 03/28/2023   Procedure: RIGHT BREAST SEED GUIDED LUMPECTOMY;  Surgeon: Ebbie Cough, MD;  Location: [facility];  Service: General;  Laterality: Right;  LMA    CHOLECYSTECTOMY  04-04-11   single site   COLONOSCOPY W/ BIOPSIES  01/24/2013   ESOPHAGOGASTRODUODENOSCOPY     KNEE SURGERY     LAPAROTOMY  05/12/2012   Procedure: LAPAROTOMY;  Surgeon: Rosaline LITTIE Cobble, MD;  Location: [facility];  Service: Gynecology;;  laparotomy Joe salpingectomy-oopharectomy   NM MYOCAR PERF WALL MOTION  09/06/2009   normal   TRANSTHORACIC ECHOCARDIOGRAM  March 2014   Normal regional wall motion. EF 55%. Mild MR   Patient Active Problem List   Diagnosis Date Noted   Ductal carcinoma in situ of right breast 03/19/2023   Fluttering sensation of heart 06/14/2022   GERD (gastroesophageal reflux disease) 12/26/2021   COVID-19 06/07/2021   Acute bronchitis 06/07/2021   Chest pain, atypical 05/24/2021   Flank pain 05/24/2021   Sprain of left ankle 05/02/2021   Peroneal tendinitis 04/01/2021   Plantar fasciitis of left foot 04/01/2021   Insomnia disorder 06/23/2019   Sebaceous cyst 06/23/2019   Prediabetes 03/24/2019   Acute sinusitis 03/18/2018   CFS (chronic fatigue syndrome) 02/06/2018   Exercise-induced asthma 02/06/2018   Type 2 diabetes mellitus without complication, without long-term current use of insulin  (HCC) 02/06/2018   Vitamin D  deficiency 02/06/2018   Metabolic syndrome 12/30/2017   Bilateral carpal tunnel syndrome 12/03/2017   Well adult exam 09/11/2016  Adenopathy 01/13/2015   Trigeminal neuralgia of left side of face 12/07/2014   Abdominal pain, epigastric 05/14/2014   Nausea with vomiting 05/14/2014   Eye exam abnormal, was told she had bled behind eye 08/22/2013   Obesity (BMI 30-39.9) 02/17/2013   Dizziness 02/17/2013   Family history of coronary artery disease 02/17/2013   IBS (irritable bowel syndrome)    Essential hypertension    Hyperlipidemia    Diarrhea - suspect IBS 12/23/2012   Hematochezia 12/23/2012   B12 deficiency 04/15/2012   Hot flashes 04/15/2012   ALLERGIC RHINITIS 12/02/2009   Asthma 12/02/2009   Anxiety disorder  06/30/2009   Depression 06/30/2009   LIPOMAS, MULTIPLE 06/29/2009   GRIEF REACTION 06/29/2009   Migraine 06/29/2009   DYSPNEA 06/29/2009    PCP: Zoanne Noel, MD  REFERRING PROVIDER: Leeroy Due, PA-C  REFERRING DIAG: DiagnosisD05.11 (ICD-10-CM) - Ductal carcinoma in situ of right breast  THERAPY DIAG:  Cramp and spasm  Muscle weakness (generalized)  Acute pain of right shoulder  Ductal carcinoma in situ of right breast  ONSET DATE: 03/28/23  Rationale for Evaluation and Treatment: Rehabilitation  SUBJECTIVE:                                                                                                                                                                                           SUBJECTIVE STATEMENT: Patient reports her shoulder is a little angry today. She is moving out of her house currently so she has been more active. Pain 5/10.  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  S/p Rt lumpectomy without SLNB on 03/28/23. Completed radiation 06/06/23.  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 No not currently but notices with working 0-4/10 Rt upper quarter  Work aggravates, Tylenol  relieves  PRECAUTIONS: None  RED FLAGS: None   WEIGHT BEARING RESTRICTIONS: No  FALLS:  Has patient fallen in last 6 months? No  LIVING ENVIRONMENT: Lives with: lives with their family and lives with their spouse  OCCUPATION: full time hairdresser   LEISURE: walk a lot   HAND DOMINANCE: right   PRIOR LEVEL OF FUNCTION: Independent  PATIENT GOALS: decrease the shoulder pain    OBJECTIVE: Note: Objective measures were completed at Evaluation unless otherwise noted.  COGNITION: Overall cognitive status: Within functional limits for tasks assessed   PALPATION: No ttp rotator cuff or biceps, +1 ttp Rt UT more posteriorly, Rt GH AP  more stiff compared to the Left  OBSERVATIONS / OTHER ASSESSMENTS:  Rt shoulder more protracted and forward in supine and in seated  POSTURE: rounded shoulders   UPPER EXTREMITY AROM/PROM:  A/PROM RIGHT   eval   Shoulder extension 45  Shoulder flexion 165  Shoulder abduction 165  Shoulder internal rotation   Shoulder external rotation 80 - pn    (Blank rows = not tested)  A/PROM LEFT   eval  Shoulder extension 60  Shoulder flexion 165  Shoulder abduction 170  Shoulder internal rotation   Shoulder external rotation 90    (Blank rows = not tested)  CERVICAL AROM: All within normal limits:   UPPER EXTREMITY STRENGTH:  5/5 bil pain with resisted flexion and abduction   QUICK DASH SURVEY: Eval: 18%                                                                                                                           TREATMENT DATE:  11/22/2023: Pulleys into flex and abd x 2 mins each  Supine over half foam roll for following: Bil UE horz abd x 10, then bil UE scaption into a V x 10, and last bil UE abd in a snow angel x 5 with 5 sec holds Trigger Point Dry Needling  Subsequent Treatment: Instructions reviewed, if requested by the patient, prior to subsequent dry needling treatment.   Patient Verbal Consent Given: Yes Education Handout Provided: Previously Provided Muscles Treated: All on right: upper trap, supraspinatus, infraspinatus, teres, pectoralis Electrical Stimulation Performed: No Treatment Response/Outcome: Utilized skilled palpation to identify trigger points.  During dry needling able to palpate muscle twitch and muscle elongation  Soft tissue mobilization performed to further promote tissue elongation and decreased pain.          10/08/2023: Pulleys into flex and abd x 2 mins each  Supine over half foam roll for following: Bil UE horz abd x 10, then bil UE scaption into a V x 10, and last bil UE abd in a snow angel x 5 with 5 sec holds Trigger  Point Dry Needling Subsequent Treatment: Pt instructed on Dry Needling rational, procedures, and possible side effects. Pt instructed to expect mild to moderate muscle soreness later in the day and/or into the next day.  Pt instructed in methods to reduce muscle soreness. Pt instructed to continue prescribed HEP. Because Dry Needling was performed over or adjacent to a lung field, pt was educated on S/S of pneumothorax and to seek immediate medical attention should they occur.  Patient was educated on signs and symptoms of infection and other risk factors and advised to seek medical attention should they occur.  Patient verbalized understanding of these instructions and education.  Patient Verbal Consent Given: Yes Education Handout Provided: Yes Muscles Treated: right upper trap, right levator scap, right supraspinatus, right infraspinatus, right teres, right pectorals (distal insertion) Electrical Stimulation Performed: No Treatment Response/Outcome: Utilized skilled palpation to identify bony landmarks and trigger points.  Able to illicit twitch response and muscle elongation.  Soft  tissue mobilization with massage cream following to further promote tissue elongation.  09/27/2023: Seated Upper trap stretch 30 sec with manual overpressure 3x (added to HEP) Seated levator scap stretch 30 sec with manual overpressure 3x (added to HEP) Pectoral stretch on wall with head rotation away Doorway flexion stretch 30 sec hold (added to HEP) Trigger Point Dry Needling Subsequent Treatment: Pt instructed on Dry Needling rational, procedures, and possible side effects. Pt instructed to expect mild to moderate muscle soreness later in the day and/or into the next day.  Pt instructed in methods to reduce muscle soreness. Pt instructed to continue prescribed HEP. Because Dry Needling was performed over or adjacent to a lung field, pt was educated on S/S of pneumothorax and to seek immediate medical attention  should they occur.  Patient was educated on signs and symptoms of infection and other risk factors and advised to seek medical attention should they occur.  Patient verbalized understanding of these instructions and education.  Patient Verbal Consent Given: Yes Education Handout Provided: Yes Muscles Treated: right upper trap, right levator scap, right supraspinatus, right infraspinatus, right teres, right pectorals (distal insertion) Electrical Stimulation Performed: No Treatment Response/Outcome: Utilized skilled palpation to identify bony landmarks and trigger points.  Able to illicit twitch response and muscle elongation.  Soft tissue mobilization with massage cream following to further promote tissue elongation. 09/17/23 Pulleys into flex and abd x 2 mins each  Supine over half foam roll for following: Bil UE horz abd x 10, then bil UE scaption into a V x 10, and last bil UE abd in a snow angel x 5 with 5 sec holds Supine scapular series with yellow theraband x 10 each returning therapist demo for each Supine shoulder flexion with 2# weight on dowel rod 2x10  Supine shoulder chest press with 2# weight on dowel rod 2x10 Standing row yellow x 15  Doorway stretch 2x20  Manual Therapy STM to Rt pect insertion where pt palpably tight and at Rt axillary scar tissue    PATIENT EDUCATION:  Education details: Supine scapular series with yellow theraband Person educated: Patient Education method: Explanation, Demonstration, Tactile cues, Verbal cues, and Handouts Education comprehension: verbalized understanding, returned demonstration, and needs further education  HOME EXERCISE PROGRAM: Access Code: [email or web address] URL: [email or web address] Date: 09/28/2023 Prepared by: Glade Pesa  Exercises - Supine Shoulder Flexion Extension AAROM with Dowel  - 1 x daily - 7 x weekly - 2 sets - 10 reps - Supine Shoulder Horizontal Abduction with Resistance  - 1 x daily - 7 x weekly - 2  sets - 10 reps - Supine Shoulder External Rotation with Resistance  - 1 x daily - 7 x weekly - 2 sets - 10 reps - Standing Shoulder Single Arm PNF D2 Flexion with Resistance  - 1 x daily - 7 x weekly - 3 sets - 10 reps - Doorway Pec Stretch at 90 Degrees Abduction  - 1 x daily - 7 x weekly - 2 reps - 20 sec hold - Standing Pec Stretch at Wall  - 1 x daily - 7 x weekly - 2 reps - 20 sec hold - Seated Chest Stretch with Hands Behind Head  - 1 x daily - 7 x weekly - 2 reps - 20 sec hold - Standing Overhead Triceps Stretch  - 1 x daily - 7 x weekly - 2 reps - 20 sec hold - Seated Scalenes Stretch  - 1 x daily - 7 x weekly - 2 reps - 20  sec hold - Seated Cervical Sidebending Stretch  - 1 x daily - 7 x weekly - 1 sets - 3 reps - 30 hold - Seated Levator Scapulae Stretch  - 1 x daily - 7 x weekly - 1 sets - 3 reps - 30 hold - Standing Shoulder Flexion Stretch on Wall  - 1 x daily - 7 x weekly - 1 sets - 3 reps - 30 hold  ASSESSMENT:  CLINICAL IMPRESSION: Patient reports she has been more painful recently. She is currently moving out of her house so she has been very active along with working. She has been having medial elbow pain recently. Discussed the potential cause of her symotoms. Patient responded well to dry needling. Palpable muscle twitch and elongation was felt. Patient will benefit from skilled PT to address the below impairments and improve overall function.     OBJECTIVE IMPAIRMENTS: decreased activity tolerance, decreased knowledge of condition, increased fascial restrictions, and pain.   ACTIVITY LIMITATIONS: carrying, lifting, and reach over head  PARTICIPATION LIMITATIONS: community activity and occupation  PERSONAL FACTORS: 1 comorbidity: radiation hx are also affecting patient's functional outcome.   REHAB POTENTIAL: Excellent  CLINICAL DECISION MAKING: Stable/uncomplicated  EVALUATION COMPLEXITY: Low  GOALS: Goals reviewed with patient? Yes  SHORT TERM GOALS = LTGs:  Target date: 12/24/2023     Pt will be ind with final stretches to improve Rt upper quadrant tolerance to work  Baseline: Goal status: In progress 11/19/2023  2.  Pt will report return to baseline work status including some mild UT and posterior shoulder pain but without Rt axillary pain and stretching  Baseline:  Goal status: in progress 11/19/2023  3.  Pt will report pain in the arm and chest of 2/10 or less at worst  Baseline: 0-4/10 (10/07/13 Goal status: in progress 11/19/2023 4. Report > or = to 75% reduction in Rt UE pain with work tasks   Baseline: 50-60% reduction  Goal Status: n progress 11/19/2023  PLAN:  PT FREQUENCY: 1x/week due to work   PT DURATION: other: 5 weeks  PLANNED INTERVENTIONS: 97110-Therapeutic exercises, 97535- Self Care, [id number]- Manual therapy, Patient/Family education, Joint mobilization, Therapeutic exercises, Therapeutic activity, Neuromuscular re-education, Gait training, and Self Care, dry needling   PLAN FOR NEXT SESSION: Pt will see ortho for dry needling for up to 4 weeks and will start pelvic floor in June.  Advance HEP as needed     Kristeen Sar, PT 11/19/23 10:38 AM [facility] [address] [place], [place] [address] Phone # [phone] Fax [phone]

## 2023-11-26 ENCOUNTER — Ambulatory Visit (INDEPENDENT_AMBULATORY_CARE_PROVIDER_SITE_OTHER): Admitting: Internal Medicine

## 2023-11-26 ENCOUNTER — Encounter: Payer: Self-pay | Admitting: Internal Medicine

## 2023-11-26 VITALS — BP 110/68 | HR 71 | Temp 98.2°F | Ht 60.0 in | Wt 147.0 lb

## 2023-11-26 DIAGNOSIS — G9332 Myalgic encephalomyelitis/chronic fatigue syndrome: Secondary | ICD-10-CM

## 2023-11-26 DIAGNOSIS — R4189 Other symptoms and signs involving cognitive functions and awareness: Secondary | ICD-10-CM | POA: Diagnosis not present

## 2023-11-26 DIAGNOSIS — E119 Type 2 diabetes mellitus without complications: Secondary | ICD-10-CM

## 2023-11-26 DIAGNOSIS — R232 Flushing: Secondary | ICD-10-CM

## 2023-11-26 DIAGNOSIS — Z0000 Encounter for general adult medical examination without abnormal findings: Secondary | ICD-10-CM | POA: Diagnosis not present

## 2023-11-26 DIAGNOSIS — E669 Obesity, unspecified: Secondary | ICD-10-CM

## 2023-11-26 LAB — LIPID PANEL
Cholesterol: 158 mg/dL (ref 0–200)
HDL: 41.6 mg/dL (ref >39.00)
LDL Cholesterol: 61 mg/dL (ref 0–99)
NonHDL: 116.87
Total CHOL/HDL Ratio: 4
Triglycerides: 279 mg/dL — ABNORMAL HIGH (ref 0.0–149.0)
VLDL: 55.8 mg/dL — ABNORMAL HIGH (ref 0.0–40.0)

## 2023-11-26 LAB — COMPREHENSIVE METABOLIC PANEL WITH GFR
ALT: 16 U/L (ref 0–35)
AST: 17 U/L (ref 0–37)
Albumin: 4.3 g/dL (ref 3.5–5.2)
Alkaline Phosphatase: 73 U/L (ref 39–117)
BUN: 26 mg/dL — ABNORMAL HIGH (ref 6–23)
CO2: 23 meq/L (ref 19–32)
Calcium: 8.9 mg/dL (ref 8.4–10.5)
Chloride: 106 meq/L (ref 96–112)
Creatinine, Ser: 0.66 mg/dL (ref 0.40–1.20)
GFR: 95.56 mL/min (ref >60.00)
Glucose, Bld: 85 mg/dL (ref 70–99)
Potassium: 3.9 meq/L (ref 3.5–5.1)
Sodium: 136 meq/L (ref 135–145)
Total Bilirubin: 0.4 mg/dL (ref 0.2–1.2)
Total Protein: 7.1 g/dL (ref 6.0–8.3)

## 2023-11-26 LAB — MICROALBUMIN / CREATININE URINE RATIO
Creatinine,U: 85.5 mg/dL
Microalb Creat Ratio: UNDETERMINED mg/g (ref 0.0–30.0)
Microalb, Ur: <0.7 mg/dL

## 2023-11-26 LAB — CBC WITH DIFFERENTIAL/PLATELET
Basophils Absolute: 0 10*3/uL (ref 0.0–0.1)
Basophils Relative: 0.4 % (ref 0.0–3.0)
Eosinophils Absolute: 0 10*3/uL (ref 0.0–0.7)
Eosinophils Relative: 0.1 % (ref 0.0–5.0)
HCT: 42.3 % (ref 36.0–46.0)
Hemoglobin: 14 g/dL (ref 12.0–15.0)
Lymphocytes Relative: 30.1 % (ref 12.0–46.0)
Lymphs Abs: 1.6 10*3/uL (ref 0.7–4.0)
MCHC: 33.1 g/dL (ref 30.0–36.0)
MCV: 91.1 fl (ref 78.0–100.0)
Monocytes Absolute: 0.6 10*3/uL (ref 0.1–1.0)
Monocytes Relative: 10.5 % (ref 3.0–12.0)
Neutro Abs: 3.2 10*3/uL (ref 1.4–7.7)
Neutrophils Relative %: 58.9 % (ref 43.0–77.0)
Platelets: 161 10*3/uL (ref 150.0–400.0)
RBC: 4.64 Mil/uL (ref 3.87–5.11)
RDW: 12.8 % (ref 11.5–15.5)
WBC: 5.4 10*3/uL (ref 4.0–10.5)

## 2023-11-26 LAB — URINALYSIS
Bilirubin Urine: NEGATIVE
Hgb urine dipstick: NEGATIVE
Ketones, ur: NEGATIVE
Leukocytes,Ua: NEGATIVE
Nitrite: NEGATIVE
Specific Gravity, Urine: 1.025 (ref 1.000–1.030)
Total Protein, Urine: NEGATIVE
Urine Glucose: >=1000 — AB
Urobilinogen, UA: 0.2 (ref 0.0–1.0)
pH: 6 (ref 5.0–8.0)

## 2023-11-26 LAB — HEMOGLOBIN A1C: Hgb A1c MFr Bld: 5.6 % (ref 4.6–6.5)

## 2023-11-26 LAB — TSH: TSH: 2.04 u[IU]/mL (ref 0.35–5.50)

## 2023-11-26 MED ORDER — ALPRAZOLAM 0.5 MG PO TABS: 0.5000 mg | ORAL_TABLET | Freq: Two times a day (BID) | ORAL | 3 refills | Status: AC | PRN

## 2023-11-26 MED ORDER — LOSARTAN POTASSIUM 50 MG PO TABS: ORAL_TABLET | ORAL | 1 refills | Status: AC

## 2023-11-26 MED ORDER — EMPAGLIFLOZIN 10 MG PO TABS: 10.0000 mg | ORAL_TABLET | Freq: Every day | ORAL | 3 refills | Status: AC

## 2023-11-26 MED ORDER — VENLAFAXINE HCL ER 75 MG PO CP24: 75.0000 mg | ORAL_CAPSULE | Freq: Every day | ORAL | 3 refills | Status: DC

## 2023-11-26 MED ORDER — TOPIRAMATE 200 MG PO TABS: 200.0000 mg | ORAL_TABLET | Freq: Every day | ORAL | 3 refills | Status: AC

## 2023-11-26 MED ORDER — AMPHETAMINE-DEXTROAMPHETAMINE 10 MG PO TABS: 10.0000 mg | ORAL_TABLET | Freq: Two times a day (BID) | ORAL | 0 refills | Status: AC

## 2023-11-26 MED ORDER — SEMAGLUTIDE (2 MG/DOSE) 8 MG/3ML ~~LOC~~ SOPN: PEN_INJECTOR | SUBCUTANEOUS | 3 refills | Status: AC

## 2023-11-26 MED ORDER — SEMAGLUTIDE (2 MG/DOSE) 8 MG/3ML ~~LOC~~ SOPN: PEN_INJECTOR | SUBCUTANEOUS | 5 refills | Status: DC

## 2023-11-26 NOTE — Assessment & Plan Note (Signed)
  We discussed age appropriate health related issues, including available/recomended screening tests and vaccinations. Labs were ordered to be later reviewed . All questions were answered. We discussed one or more of the following - seat belt use, use of sunscreen/sun exposure exercise, safe sex, fall risk reduction, second hand smoke exposure, firearm use and storage, seat belt use, a need for adhering to healthy diet and exercise. Labs were ordered.  All questions were answered. tDAP next is past due (last in 2013) Colonoscopy was due in 2024.  Gwenetta will schedule with Dr. Avram Slack exam every year

## 2023-11-26 NOTE — Assessment & Plan Note (Signed)
 Post- COVID 19. No relation to meds. On Adderall low dose  Potential benefits of a long term amphetamines  use as well as potential risks  and complications were explained to the patient and were aknowledged.

## 2023-11-26 NOTE — Assessment & Plan Note (Signed)
 Ductal carcinoma in situ of right breast Staging form: Breast, AJCC 8th Edition - Pathologic stage from 03/28/2023: Stage 0 (pTis (DCIS), pN0, cM0, ER+) - Signed by Crawford Morna Pickle, NP on 09/17/2023 Stage prefix: Initial diagnosis

## 2023-11-26 NOTE — Assessment & Plan Note (Signed)
 Metformin  500 mg XR a day - pt stopped On Ozempic  - may need a PA On Jardiance , Ozempic   - increase to 2 mg/wk

## 2023-11-26 NOTE — Progress Notes (Signed)
 Subjective:  Patient ID: Amy Lang, female    DOB: 06-Nov-1965  Age: 57 y.o. MRN: [id number]  CC: Annual Exam   HPI Amy Lang presents for DM, breast cancer, well exam  Outpatient Medications Prior to Visit  Medication Sig Dispense Refill   albuterol  (VENTOLIN  HFA) 108 (90 Base) MCG/ACT inhaler TAKE 2 PUFFS BY MOUTH EVERY 6 HOURS AS NEEDED FOR WHEEZE OR SHORTNESS OF BREATH 54 each 3   aspirin  EC 81 MG tablet Take 81 mg by mouth every evening.     cetirizine (ZYRTEC) 10 MG tablet Take 10 mg by mouth daily.     Cholecalciferol (VITAMIN D3) 50 MCG (2000 UT) capsule Take 1 capsule (2,000 Units total) by mouth daily. 100 capsule 3   cyanocobalamin  (VITAMIN B12) 1000 MCG/ML injection INJECT 1 ML INTO MUSCLE OR SUBCUTANEOUSLY EVERY 2 WEEKS (LIFETIME) 6 mL 3   FLUoxetine  (PROZAC ) 40 MG capsule TAKE 1 CAPSULE BY MOUTH EVERY DAY 90 capsule 1   fluticasone  (FLONASE ) 50 MCG/ACT nasal spray SPRAY 2 SPRAYS INTO EACH NOSTRIL EVERY DAY 48 mL 3   gabapentin  (NEURONTIN ) 100 MG capsule Take 1-2 capsules (100-200 mg total) by mouth at bedtime. (Patient taking differently: Take 100-200 mg by mouth daily as needed.) 180 capsule 1   gabapentin  (NEURONTIN ) 300 MG capsule Take 1 capsule (300 mg total) by mouth at bedtime. 30 capsule 1   Multiple Vitamins-Minerals (PRESERVISION AREDS 2) CAPS Take 1 capsule by mouth at bedtime.     Omega-3 Fatty Acids (FISH OIL) 1000 MG CAPS Take 1,000 mg by mouth daily.     ondansetron  (ZOFRAN ) 4 MG tablet Take 1 tablet (4 mg total) by mouth every 8 (eight) hours as needed for nausea or vomiting. 20 tablet 1   pantoprazole  (PROTONIX ) 40 MG tablet Take 1 tablet (40 mg total) by mouth daily. 90 tablet 3   Probiotic Product (PROBIOTIC ADVANCED PO) Take by mouth daily.     SYRINGE-NEEDLE, DISP, 3 ML (BD ECLIPSE SYRINGE) 25G X 1 3 ML MISC Use sq for B12 inj 50 each 3   tamoxifen  (NOLVADEX ) 10 MG tablet Take 0.5 tablets (5 mg total) by mouth daily. 90 tablet 1   triamcinolone   ointment (KENALOG ) 0.5 % Apply 1 Application topically 2 (two) times daily. 30 g 2   venlafaxine  XR (EFFEXOR -XR) 37.5 MG 24 hr capsule TAKE 1 CAPSULE BY MOUTH DAILY WITH BREAKFAST. 90 capsule 2   vitamin C (ASCORBIC ACID) 500 MG tablet Take 500 mg by mouth daily.     vitamin E 1000 UNIT capsule Take 1,000 Units by mouth daily.     Zinc 50 MG CAPS Take by mouth.     ALPRAZolam  (XANAX ) 0.5 MG tablet Take 1 tablet (0.5 mg total) by mouth 2 (two) times daily as needed for anxiety. 60 tablet 3   amphetamine -dextroamphetamine  (ADDERALL) 10 MG tablet Take 1 tablet (10 mg total) by mouth 2 (two) times daily. 60 tablet 0   empagliflozin  (JARDIANCE ) 10 MG TABS tablet TAKE 1 TABLET BY MOUTH EVERY DAY 90 tablet 3   losartan  (COZAAR ) 50 MG tablet TAKE 2 TABLETS (100MG ) BY MOUTH EVERY DAY 180 tablet 1   Semaglutide , 1 MG/DOSE, 4 MG/3ML SOPN Inject 1 mg as directed once a week. 9 mL 1   topiramate  (TOPAMAX ) 200 MG tablet TAKE 1/2-1 TABLETS BY MOUTH DAILY 60 tablet 5   No facility-administered medications prior to visit.    ROS: Review of Systems  Constitutional:  Positive for diaphoresis and  fatigue. Negative for activity change, appetite change, chills and unexpected weight change.  HENT:  Negative for congestion, mouth sores and sinus pressure.   Eyes:  Negative for visual disturbance.  Respiratory:  Negative for cough and chest tightness.   Gastrointestinal:  Negative for abdominal pain and nausea.  Genitourinary:  Negative for difficulty urinating, frequency and vaginal pain.  Musculoskeletal:  Negative for back pain and gait problem.  Skin:  Positive for wound. Negative for pallor and rash.  Neurological:  Negative for dizziness, tremors, weakness, numbness and headaches.  Hematological:  Does not bruise/bleed easily.  Psychiatric/Behavioral:  Positive for decreased concentration, dysphoric mood and sleep disturbance. Negative for agitation, behavioral problems, confusion, self-injury and suicidal  ideas. The patient is nervous/anxious.     Objective:  BP 110/68   Pulse 71   Temp 98.2 F (36.8 C) (Oral)   Ht 5' (1.524 m)   Wt 147 lb (66.7 kg)   SpO2 94%   BMI 28.71 kg/m   BP Readings from Last 3 Encounters:  11/26/23 110/68  09/17/23 (!) 112/54  08/13/23 (!) 114/52    Wt Readings from Last 3 Encounters:  11/26/23 147 lb (66.7 kg)  09/17/23 150 lb (68 kg)  08/13/23 153 lb 6 oz (69.6 kg)    Physical Exam Constitutional:      General: She is not in acute distress.    Appearance: She is well-developed. She is obese.  HENT:     Head: Normocephalic.     Right Ear: External ear normal.     Left Ear: External ear normal.     Nose: Nose normal.   Eyes:     General:        Right eye: No discharge.        Left eye: No discharge.     Conjunctiva/sclera: Conjunctivae normal.     Pupils: Pupils are equal, round, and reactive to light.   Neck:     Thyroid : No thyromegaly.     Vascular: No JVD.     Trachea: No tracheal deviation.   Cardiovascular:     Rate and Rhythm: Normal rate and regular rhythm.     Heart sounds: Normal heart sounds.  Pulmonary:     Effort: No respiratory distress.     Breath sounds: No stridor. No wheezing.  Abdominal:     General: Bowel sounds are normal. There is no distension.     Palpations: Abdomen is soft. There is no mass.     Tenderness: There is no abdominal tenderness. There is no guarding or rebound.   Musculoskeletal:        General: No tenderness.     Cervical back: Normal range of motion and neck supple. No rigidity.  Lymphadenopathy:     Cervical: No cervical adenopathy.   Skin:    Findings: No erythema or rash.   Neurological:     Cranial Nerves: No cranial nerve deficit.     Motor: No abnormal muscle tone.     Coordination: Coordination normal.     Deep Tendon Reflexes: Reflexes normal.   Psychiatric:        Behavior: Behavior normal.        Thought Content: Thought content normal.        Judgment: Judgment  normal.     Lab Results  Component Value Date   WBC 5.4 11/26/2023   HGB 14.0 11/26/2023   HCT 42.3 11/26/2023   PLT 161.0 11/26/2023   GLUCOSE 85 11/26/2023  CHOL 158 11/26/2023   TRIG 279.0 (H) 11/26/2023   HDL 41.60 11/26/2023   LDLDIRECT 96.0 10/01/2017   LDLCALC 61 11/26/2023   ALT 16 11/26/2023   AST 17 11/26/2023   NA 136 11/26/2023   K 3.9 11/26/2023   CL 106 11/26/2023   CREATININE 0.66 11/26/2023   BUN 26 (H) 11/26/2023   CO2 23 11/26/2023   TSH 2.04 11/26/2023   HGBA1C 5.6 11/26/2023   MICROALBUR <0.7 11/26/2023    No results found.  Assessment & Plan:   Problem List Items Addressed This Visit     Hot flashes   Ductal carcinoma in situ of right breast Staging form: Breast, AJCC 8th Edition - Pathologic stage from 03/28/2023: Stage 0 (pTis (DCIS), pN0, cM0, ER+) - Signed by Crawford Morna Pickle, NP on 09/17/2023 Stage prefix: Initial diagnosis      Relevant Medications   losartan  (COZAAR ) 50 MG tablet   Obesity (BMI 30-39.9) (Chronic)   On Ozempic       Relevant Medications   amphetamine -dextroamphetamine  (ADDERALL) 10 MG tablet   empagliflozin  (JARDIANCE ) 10 MG TABS tablet   Semaglutide , 2 MG/DOSE, 8 MG/3ML SOPN   Well adult exam - Primary    We discussed age appropriate health related issues, including available/recomended screening tests and vaccinations. Labs were ordered to be later reviewed . All questions were answered. We discussed one or more of the following - seat belt use, use of sunscreen/sun exposure exercise, safe sex, fall risk reduction, second hand smoke exposure, firearm use and storage, seat belt use, a need for adhering to healthy diet and exercise. Labs were ordered.  All questions were answered. tDAP next is past due (last in 2013) Colonoscopy was due in 2024.  Demetris will schedule with Dr. Avram Slack exam every year       Relevant Orders   TSH (Completed)   Urinalysis (Completed)   CBC with Differential/Platelet  (Completed)   Lipid panel (Completed)   Comprehensive metabolic panel with GFR (Completed)   Hemoglobin A1c (Completed)   Microalbumin / creatinine urine ratio (Completed)   CFS (chronic fatigue syndrome)   Worse after COVID 19. No relation to meds. On Adderall low dose  Potential benefits of a long term amphetamines  use as well as potential risks  and complications were explained to the patient and were aknowledged.       Type 2 diabetes mellitus without complication, without long-term current use of insulin  (HCC)   Metformin  500 mg XR a day - pt stopped On Ozempic  - may need a PA On Jardiance , Ozempic   - increase to 2 mg/wk      Relevant Medications   empagliflozin  (JARDIANCE ) 10 MG TABS tablet   losartan  (COZAAR ) 50 MG tablet   Semaglutide , 2 MG/DOSE, 8 MG/3ML SOPN   Other Relevant Orders   Hemoglobin A1c (Completed)   Microalbumin / creatinine urine ratio (Completed)   Brain fog   Post- COVID 19. No relation to meds. On Adderall low dose  Potential benefits of a long term amphetamines  use as well as potential risks  and complications were explained to the patient and were aknowledged.         Meds ordered this encounter  Medications   ALPRAZolam  (XANAX ) 0.5 MG tablet    Sig: Take 1 tablet (0.5 mg total) by mouth 2 (two) times daily as needed for anxiety.    Dispense:  60 tablet    Refill:  3   amphetamine -dextroamphetamine  (ADDERALL) 10 MG tablet  Sig: Take 1 tablet (10 mg total) by mouth 2 (two) times daily.    Dispense:  60 tablet    Refill:  0   empagliflozin  (JARDIANCE ) 10 MG TABS tablet    Sig: Take 1 tablet (10 mg total) by mouth daily.    Dispense:  90 tablet    Refill:  3   losartan  (COZAAR ) 50 MG tablet    Sig: TAKE 2 TABLETS (100MG ) BY MOUTH EVERY DAY    Dispense:  180 tablet    Refill:  1   topiramate  (TOPAMAX ) 200 MG tablet    Sig: Take 1 tablet (200 mg total) by mouth daily.    Dispense:  90 tablet    Refill:  3   DISCONTD: Semaglutide , 2  MG/DOSE, 8 MG/3ML SOPN    Sig: 2 mg sq weekly    Dispense:  3 mL    Refill:  5   venlafaxine  XR (EFFEXOR  XR) 75 MG 24 hr capsule    Sig: Take 1 capsule (75 mg total) by mouth daily with breakfast.    Dispense:  90 capsule    Refill:  3   Semaglutide , 2 MG/DOSE, 8 MG/3ML SOPN    Sig: 2 mg sq weekly    Dispense:  9 mL    Refill:  3      Follow-up: Return in about 3 months (around 02/26/2024) for a follow-up visit.  Marolyn Noel, MD

## 2023-11-26 NOTE — Assessment & Plan Note (Signed)
 Worse after COVID 19. No relation to meds. On Adderall low dose  Potential benefits of a long term amphetamines  use as well as potential risks  and complications were explained to the patient and were aknowledged.

## 2023-11-26 NOTE — Assessment & Plan Note (Signed)
On Ozempic

## 2023-11-27 ENCOUNTER — Ambulatory Visit: Payer: Self-pay | Attending: Obstetrics and Gynecology

## 2023-11-27 DIAGNOSIS — M6281 Muscle weakness (generalized): Secondary | ICD-10-CM | POA: Diagnosis not present

## 2023-11-27 DIAGNOSIS — I890 Lymphedema, not elsewhere classified: Secondary | ICD-10-CM | POA: Diagnosis not present

## 2023-11-27 DIAGNOSIS — M25611 Stiffness of right shoulder, not elsewhere classified: Secondary | ICD-10-CM | POA: Insufficient documentation

## 2023-11-27 DIAGNOSIS — D0511 Intraductal carcinoma in situ of right breast: Secondary | ICD-10-CM | POA: Diagnosis not present

## 2023-11-27 DIAGNOSIS — M25511 Pain in right shoulder: Secondary | ICD-10-CM | POA: Insufficient documentation

## 2023-11-27 DIAGNOSIS — R252 Cramp and spasm: Secondary | ICD-10-CM | POA: Insufficient documentation

## 2023-11-27 NOTE — Therapy (Signed)
 OUTPATIENT PHYSICAL THERAPY  UPPER EXTREMITY ONCOLOGY TREATMENT/ RE-CERTIFICATION   Patient Name: Amy Lang MRN: [id number] DOB:09/24/1965, 58 y.o., female Today's Date: 11/27/2023  END OF SESSION:  PT End of Session - 11/27/23 1015     Visit Number 9    Date for PT Re-Evaluation 12/24/23    Authorization Type BCBS [id number]  No Auth Required  $40 Copay  VL- 60 visits- pelvic    PT Start Time 1016    PT Stop Time 1100    PT Time Calculation (min) 44 min    Activity Tolerance Patient tolerated treatment well    Behavior During Therapy WFL for tasks assessed/performed            Past Medical History:  Diagnosis Date   Allergy    Anxiety    Asthma    Depression    Diabetes (HCC)    Dyslipidemia    History of radiation therapy    Right breast-05/08/23-06/06/23- Dr. Lynwood Nasuti   Hypertension    IBS (irritable bowel syndrome)    Lactose intolerance    Migraines    Obesity    PONV (postoperative nausea and vomiting)    Shortness of breath    Vaginal cancer (HCC)    Vitamin B 12 deficiency    Vitamin D  deficiency    Past Surgical History:  Procedure Laterality Date   ABDOMINAL HYSTERECTOMY     still has bilateral ovaries   BREAST BIOPSY Right 03/02/2023   MM RT BREAST BX W LOC DEV 1ST LESION IMAGE BX SPEC STEREO GUIDE 03/02/2023 [facility]   BREAST BIOPSY  03/27/2023   MM RT RADIOACTIVE SEED LOC MAMMO GUIDE 03/27/2023 [facility]   BREAST EXCISIONAL BIOPSY Right 08/27/2017   BREAST LUMPECTOMY WITH RADIOACTIVE SEED LOCALIZATION Right 08/28/2017   Procedure: BREAST LUMPECTOMY WITH RADIOACTIVE SEED LOCALIZATION;  Surgeon: Vanderbilt Ned, MD;  Location: [facility];  Service: General;  Laterality: Right;   BREAST LUMPECTOMY WITH RADIOACTIVE SEED LOCALIZATION Right 03/28/2023   Procedure: RIGHT BREAST SEED GUIDED LUMPECTOMY;  Surgeon: Ebbie Cough, MD;  Location: [facility];  Service: General;  Laterality: Right;  LMA    CHOLECYSTECTOMY  04-04-11   single site   COLONOSCOPY W/ BIOPSIES  01/24/2013   ESOPHAGOGASTRODUODENOSCOPY     KNEE SURGERY     LAPAROTOMY  05/12/2012   Procedure: LAPAROTOMY;  Surgeon: Rosaline LITTIE Cobble, MD;  Location: [facility];  Service: Gynecology;;  laparotomy Joe salpingectomy-oopharectomy   NM MYOCAR PERF WALL MOTION  09/06/2009   normal   TRANSTHORACIC ECHOCARDIOGRAM  March 2014   Normal regional wall motion. EF 55%. Mild MR   Patient Active Problem List   Diagnosis Date Noted   Brain fog 11/26/2023   Ductal carcinoma in situ of right breast 03/19/2023   Fluttering sensation of heart 06/14/2022   GERD (gastroesophageal reflux disease) 12/26/2021   COVID-19 06/07/2021   Acute bronchitis 06/07/2021   Chest pain, atypical 05/24/2021   Flank pain 05/24/2021   Sprain of left ankle 05/02/2021   Peroneal tendinitis 04/01/2021   Plantar fasciitis of left foot 04/01/2021   Insomnia disorder 06/23/2019   Sebaceous cyst 06/23/2019   Prediabetes 03/24/2019   Acute sinusitis 03/18/2018   CFS (chronic fatigue syndrome) 02/06/2018   Exercise-induced asthma 02/06/2018   Type 2 diabetes mellitus without complication, without long-term current use of insulin  (HCC) 02/06/2018   Vitamin D  deficiency 02/06/2018   Metabolic syndrome 12/30/2017   Bilateral carpal tunnel syndrome 12/03/2017  Well adult exam 09/11/2016   Adenopathy 01/13/2015   Trigeminal neuralgia of left side of face 12/07/2014   Abdominal pain, epigastric 05/14/2014   Nausea with vomiting 05/14/2014   Eye exam abnormal, was told she had bled behind eye 08/22/2013   Obesity (BMI 30-39.9) 02/17/2013   Dizziness 02/17/2013   Family history of coronary artery disease 02/17/2013   IBS (irritable bowel syndrome)    Essential hypertension    Hyperlipidemia    Diarrhea - suspect IBS 12/23/2012   Hematochezia 12/23/2012   B12 deficiency 04/15/2012   Hot flashes 04/15/2012   ALLERGIC RHINITIS 12/02/2009   Asthma  12/02/2009   Anxiety disorder 06/30/2009   Depression 06/30/2009   LIPOMAS, MULTIPLE 06/29/2009   GRIEF REACTION 06/29/2009   Migraine 06/29/2009   DYSPNEA 06/29/2009    PCP: Zoanne Noel, MD  REFERRING PROVIDER: Leeroy Due, PA-C  REFERRING DIAG: DiagnosisD05.11 (ICD-10-CM) - Ductal carcinoma in situ of right breast  THERAPY DIAG:  Acute pain of right shoulder  Stiffness of right shoulder, not elsewhere classified  Muscle weakness (generalized)  Cramp and spasm  ONSET DATE: 03/28/23  Rationale for Evaluation and Treatment: Rehabilitation  SUBJECTIVE:                                                                                                                                                                                           SUBJECTIVE STATEMENT: Patient reports she is canning today and the right shoulder is sore.   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  S/p Rt lumpectomy without SLNB on 03/28/23. Completed radiation 06/06/23.  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 No  LIVING ENVIRONMENT: Lives with: lives with their family and lives with their spouse  OCCUPATION: full time hairdresser   LEISURE: walk a lot   HAND DOMINANCE: right   PRIOR LEVEL OF FUNCTION: Independent  PATIENT GOALS: decrease the shoulder pain    OBJECTIVE: Note: Objective measures were completed at Evaluation unless otherwise noted.  COGNITION: Overall cognitive status: Within functional limits for tasks assessed   PALPATION: No ttp rotator cuff or biceps, +1 ttp Rt UT more posteriorly, Rt GH AP more stiff compared to the Left  OBSERVATIONS / OTHER  ASSESSMENTS: Rt shoulder more protracted and forward in supine and in seated  POSTURE:  rounded shoulders   UPPER EXTREMITY AROM/PROM:  A/PROM RIGHT   eval   Shoulder extension 45  Shoulder flexion 165  Shoulder abduction 165  Shoulder internal rotation   Shoulder external rotation 80 - pn    (Blank rows = not tested)  A/PROM LEFT   eval  Shoulder extension 60  Shoulder flexion 165  Shoulder abduction 170  Shoulder internal rotation   Shoulder external rotation 90    (Blank rows = not tested)  CERVICAL AROM: All within normal limits:   UPPER EXTREMITY STRENGTH:  5/5 bil pain with resisted flexion and abduction   QUICK DASH SURVEY: Eval: 18%                                                                                                                           TREATMENT DATE:  11/22/2023: Pulleys into flex and abd x 2 mins each  Back against wall: field goal position, arm raises Fwd bent shoulder ext, rows, and horizontal abduction 2 x 10 with 1 lb Side lying shoulder ER  2 x 10 with 1 lb Supine serratus punch with 1 lbs 2 x 10 Trigger Point Dry Needling Subsequent Treatment: Instructions reviewed, if requested by the patient, prior to subsequent dry needling treatment.  Patient Verbal Consent Given: Yes Education Handout Provided: Previously Provided Muscles Treated: All on right: upper trap, supraspinatus, infraspinatus, teres, pectoralis Electrical Stimulation Performed: No Treatment Response/Outcome: Skilled palpation used to identify taut bands and trigger points.  Once identified, dry needling techniques used to treat these areas.  Twitch response ellicited along with palpable elongation of muscle.  Following treatment, patient reported mild soreness.    10/08/2023: Pulleys into flex and abd x 2 mins each  Supine over half foam roll for following: Bil UE horz abd x 10, then bil UE scaption into a V x 10, and last bil UE abd in a snow angel x 5 with 5 sec  holds Trigger Point Dry Needling Subsequent Treatment: Pt instructed on Dry Needling rational, procedures, and possible side effects. Pt instructed to expect mild to moderate muscle soreness later in the day and/or into the next day.  Pt instructed in methods to reduce muscle soreness. Pt instructed to continue prescribed HEP. Because Dry Needling was performed over or adjacent to a lung field, pt was educated on S/S of pneumothorax and to seek immediate medical attention should they occur.  Patient was educated on signs and symptoms of infection and other risk factors and advised to seek medical attention should they occur.  Patient verbalized understanding of these instructions and education.  Patient Verbal Consent Given: Yes Education Handout Provided: Yes Muscles Treated: right upper trap, right levator scap, right supraspinatus, right infraspinatus, right teres, right pectorals (distal insertion) Electrical Stimulation Performed: No Treatment Response/Outcome: Utilized skilled palpation to identify bony landmarks and trigger points.  Able to illicit twitch response and muscle elongation.  Soft tissue mobilization with massage cream following to further promote tissue  elongation.  09/27/2023: Seated Upper trap stretch 30 sec with manual overpressure 3x (added to HEP) Seated levator scap stretch 30 sec with manual overpressure 3x (added to HEP) Pectoral stretch on wall with head rotation away Doorway flexion stretch 30 sec hold (added to HEP) Trigger Point Dry Needling Subsequent Treatment: Pt instructed on Dry Needling rational, procedures, and possible side effects. Pt instructed to expect mild to moderate muscle soreness later in the day and/or into the next day.  Pt instructed in methods to reduce muscle soreness. Pt instructed to continue prescribed HEP. Because Dry Needling was performed over or adjacent to a lung field, pt was educated on S/S of pneumothorax and to seek immediate  medical attention should they occur.  Patient was educated on signs and symptoms of infection and other risk factors and advised to seek medical attention should they occur.  Patient verbalized understanding of these instructions and education.  Patient Verbal Consent Given: Yes Education Handout Provided: Yes Muscles Treated: right upper trap, right levator scap, right supraspinatus, right infraspinatus, right teres, right pectorals (distal insertion) Electrical Stimulation Performed: No Treatment Response/Outcome: Utilized skilled palpation to identify bony landmarks and trigger points.  Able to illicit twitch response and muscle elongation.  Soft tissue mobilization with massage cream following to further promote tissue elongation.  PATIENT EDUCATION:  Education details: Supine scapular series with yellow theraband Person educated: Patient Education method: Explanation, Demonstration, Tactile cues, Verbal cues, and Handouts Education comprehension: verbalized understanding, returned demonstration, and needs further education  HOME EXERCISE PROGRAM: Access Code: [email or web address] URL: [email or web address] Date: 09/28/2023 Prepared by: Glade Pesa  Exercises - Supine Shoulder Flexion Extension AAROM with Dowel  - 1 x daily - 7 x weekly - 2 sets - 10 reps - Supine Shoulder Horizontal Abduction with Resistance  - 1 x daily - 7 x weekly - 2 sets - 10 reps - Supine Shoulder External Rotation with Resistance  - 1 x daily - 7 x weekly - 2 sets - 10 reps - Standing Shoulder Single Arm PNF D2 Flexion with Resistance  - 1 x daily - 7 x weekly - 3 sets - 10 reps - Doorway Pec Stretch at 90 Degrees Abduction  - 1 x daily - 7 x weekly - 2 reps - 20 sec hold - Standing Pec Stretch at Wall  - 1 x daily - 7 x weekly - 2 reps - 20 sec hold - Seated Chest Stretch with Hands Behind Head  - 1 x daily - 7 x weekly - 2 reps - 20 sec hold - Standing Overhead Triceps Stretch  - 1 x daily - 7 x weekly  - 2 reps - 20 sec hold - Seated Scalenes Stretch  - 1 x daily - 7 x weekly - 2 reps - 20 sec hold - Seated Cervical Sidebending Stretch  - 1 x daily - 7 x weekly - 1 sets - 3 reps - 30 hold - Seated Levator Scapulae Stretch  - 1 x daily - 7 x weekly - 1 sets - 3 reps - 30 hold - Standing Shoulder Flexion Stretch on Wall  - 1 x daily - 7 x weekly - 1 sets - 3 reps - 30 hold  ASSESSMENT:  CLINICAL IMPRESSION: Chanin is progressing appropriately.  She is in the middle of moving out of her home due to some reconstruction work that will be done.  She is also continuing to work as a Producer, television/film/video which is likely contributing to her pain.  She  is well motivated and should continue to do well.  Patient will benefit from continued skilled PT to address the below impairments and improve overall function.     OBJECTIVE IMPAIRMENTS: decreased activity tolerance, decreased knowledge of condition, increased fascial restrictions, and pain.   ACTIVITY LIMITATIONS: carrying, lifting, and reach over head  PARTICIPATION LIMITATIONS: community activity and occupation  PERSONAL FACTORS: 1 comorbidity: radiation hx are also affecting patient's functional outcome.   REHAB POTENTIAL: Excellent  CLINICAL DECISION MAKING: Stable/uncomplicated  EVALUATION COMPLEXITY: Low  GOALS: Goals reviewed with patient? Yes  SHORT TERM GOALS = LTGs: Target date: 12/24/2023     Pt will be ind with final stretches to improve Rt upper quadrant tolerance to work  Baseline: Goal status: In progress 11/19/2023  2.  Pt will report return to baseline work status including some mild UT and posterior shoulder pain but without Rt axillary pain and stretching  Baseline:  Goal status: in progress 11/19/2023  3.  Pt will report pain in the arm and chest of 2/10 or less at worst  Baseline: 0-4/10 (10/07/13 Goal status: in progress 11/19/2023 4. Report > or = to 75% reduction in Rt UE pain with work tasks   Baseline: 50-60%  reduction  Goal Status: n progress 11/19/2023  PLAN:  PT FREQUENCY: 1x/week due to work   PT DURATION: other: 5 weeks  PLANNED INTERVENTIONS: 97110-Therapeutic exercises, 97535- Self Care, [id number]- Manual therapy, Patient/Family education, Joint mobilization, Therapeutic exercises, Therapeutic activity, Neuromuscular re-education, Gait training, and Self Care, dry needling   PLAN FOR NEXT SESSION: Assess response to new exercises added last visit.  (Prone shoulder series)  If no significant soreness, add these to HEP if no issues.  Pt will see ortho for dry needling for up to 4 weeks and will start pelvic floor in June.  Advance HEP as needed   Delon B. Oyuki Hogan, PT 11/27/23 11:16 AM [facility] [address] [place], [place] [address] Phone # [phone] Fax [phone]

## 2023-11-28 ENCOUNTER — Ambulatory Visit: Payer: Self-pay | Admitting: Internal Medicine

## 2023-12-07 NOTE — Therapy (Signed)
 OUTPATIENT PHYSICAL THERAPY  UPPER EXTREMITY ONCOLOGY TREATMENT/ RE-CERTIFICATION   Patient Name: Amy Lang MRN: [id number] DOB:Dec 15, 1965, 58 y.o., female Today's Date: 12/10/2023  END OF SESSION:  PT End of Session - 12/10/23 1105     Visit Number 10    Date for PT Re-Evaluation 12/24/23    Authorization Type BCBS [id number]  No Auth Required  $40 Copay  VL- 60 visits- pelvic    PT Start Time 1106    PT Stop Time 1148    PT Time Calculation (min) 42 min    Activity Tolerance Patient tolerated treatment well    Behavior During Therapy WFL for tasks assessed/performed             Past Medical History:  Diagnosis Date   Allergy    Anxiety    Asthma    Depression    Diabetes (HCC)    Dyslipidemia    History of radiation therapy    Right breast-05/08/23-06/06/23- Dr. Lynwood Nasuti   Hypertension    IBS (irritable bowel syndrome)    Lactose intolerance    Migraines    Obesity    PONV (postoperative nausea and vomiting)    Shortness of breath    Vaginal cancer (HCC)    Vitamin B 12 deficiency    Vitamin D  deficiency    Past Surgical History:  Procedure Laterality Date   ABDOMINAL HYSTERECTOMY     still has bilateral ovaries   BREAST BIOPSY Right 03/02/2023   MM RT BREAST BX W LOC DEV 1ST LESION IMAGE BX SPEC STEREO GUIDE 03/02/2023 [facility]   BREAST BIOPSY  03/27/2023   MM RT RADIOACTIVE SEED LOC MAMMO GUIDE 03/27/2023 [facility]   BREAST EXCISIONAL BIOPSY Right 08/27/2017   BREAST LUMPECTOMY WITH RADIOACTIVE SEED LOCALIZATION Right 08/28/2017   Procedure: BREAST LUMPECTOMY WITH RADIOACTIVE SEED LOCALIZATION;  Surgeon: Vanderbilt Ned, MD;  Location: [facility];  Service: General;  Laterality: Right;   BREAST LUMPECTOMY WITH RADIOACTIVE SEED LOCALIZATION Right 03/28/2023   Procedure: RIGHT BREAST SEED GUIDED LUMPECTOMY;  Surgeon: Ebbie Cough, MD;  Location: [facility];  Service: General;  Laterality: Right;  LMA    CHOLECYSTECTOMY  04-04-11   single site   COLONOSCOPY W/ BIOPSIES  01/24/2013   ESOPHAGOGASTRODUODENOSCOPY     KNEE SURGERY     LAPAROTOMY  05/12/2012   Procedure: LAPAROTOMY;  Surgeon: Rosaline LITTIE Cobble, MD;  Location: [facility];  Service: Gynecology;;  laparotomy Joe salpingectomy-oopharectomy   NM MYOCAR PERF WALL MOTION  09/06/2009   normal   TRANSTHORACIC ECHOCARDIOGRAM  March 2014   Normal regional wall motion. EF 55%. Mild MR   Patient Active Problem List   Diagnosis Date Noted   Brain fog 11/26/2023   Ductal carcinoma in situ of right breast 03/19/2023   Fluttering sensation of heart 06/14/2022   GERD (gastroesophageal reflux disease) 12/26/2021   COVID-19 06/07/2021   Acute bronchitis 06/07/2021   Chest pain, atypical 05/24/2021   Flank pain 05/24/2021   Sprain of left ankle 05/02/2021   Peroneal tendinitis 04/01/2021   Plantar fasciitis of left foot 04/01/2021   Insomnia disorder 06/23/2019   Sebaceous cyst 06/23/2019   Prediabetes 03/24/2019   Acute sinusitis 03/18/2018   CFS (chronic fatigue syndrome) 02/06/2018   Exercise-induced asthma 02/06/2018   Type 2 diabetes mellitus without complication, without long-term current use of insulin  (HCC) 02/06/2018   Vitamin D  deficiency 02/06/2018   Metabolic syndrome 12/30/2017   Bilateral carpal tunnel syndrome 12/03/2017  Well adult exam 09/11/2016   Adenopathy 01/13/2015   Trigeminal neuralgia of left side of face 12/07/2014   Abdominal pain, epigastric 05/14/2014   Nausea with vomiting 05/14/2014   Eye exam abnormal, was told she had bled behind eye 08/22/2013   Obesity (BMI 30-39.9) 02/17/2013   Dizziness 02/17/2013   Family history of coronary artery disease 02/17/2013   IBS (irritable bowel syndrome)    Essential hypertension    Hyperlipidemia    Diarrhea - suspect IBS 12/23/2012   Hematochezia 12/23/2012   B12 deficiency 04/15/2012   Hot flashes 04/15/2012   ALLERGIC RHINITIS 12/02/2009   Asthma  12/02/2009   Anxiety disorder 06/30/2009   Depression 06/30/2009   LIPOMAS, MULTIPLE 06/29/2009   GRIEF REACTION 06/29/2009   Migraine 06/29/2009   DYSPNEA 06/29/2009    PCP: Zoanne Noel, MD  REFERRING PROVIDER: Leeroy Due, PA-C  REFERRING DIAG: DiagnosisD05.11 (ICD-10-CM) - Ductal carcinoma in situ of right breast  THERAPY DIAG:  Acute pain of right shoulder  Stiffness of right shoulder, not elsewhere classified  Muscle weakness (generalized)  Cramp and spasm  Ductal carcinoma in situ of right breast  Lymphedema, not elsewhere classified  ONSET DATE: 03/28/23  Rationale for Evaluation and Treatment: Rehabilitation  SUBJECTIVE:                                                                                                                                                                                           SUBJECTIVE STATEMENT: My shoulder is still sore because we are still moving.   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  S/p Rt lumpectomy without SLNB on 03/28/23. Completed radiation 06/06/23.  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 No  LIVING ENVIRONMENT: Lives with: lives with their family and lives with their spouse  OCCUPATION: full time hairdresser   LEISURE: walk a lot   HAND DOMINANCE: right   PRIOR LEVEL OF FUNCTION: Independent  PATIENT GOALS: decrease the shoulder pain    OBJECTIVE: Note: Objective measures were completed at Evaluation unless otherwise noted.  COGNITION: Overall cognitive status: Within functional limits for tasks assessed   PALPATION: No ttp rotator cuff or biceps, +1 ttp Rt UT more posteriorly, Rt  GH AP more stiff compared to the Left  OBSERVATIONS / OTHER ASSESSMENTS: Rt shoulder  more protracted and forward in supine and in seated  POSTURE: rounded shoulders   UPPER EXTREMITY AROM/PROM:  A/PROM RIGHT   eval   Shoulder extension 45  Shoulder flexion 165  Shoulder abduction 165  Shoulder internal rotation   Shoulder external rotation 80 - pn    (Blank rows = not tested)  A/PROM LEFT   eval  Shoulder extension 60  Shoulder flexion 165  Shoulder abduction 170  Shoulder internal rotation   Shoulder external rotation 90    (Blank rows = not tested)  CERVICAL AROM: All within normal limits:   UPPER EXTREMITY STRENGTH:  5/5 bil pain with resisted flexion and abduction   QUICK DASH SURVEY: Eval: 18%                                                                                                                           TREATMENT DATE:  12/10/2023: Pulleys into flex and abd x 2 mins each  Fwd bent shoulder ext, rows, and horizontal abduction 2 x 10 with 1 lb  Side lying shoulder ER  2 x 10 with 1 lb Supine serratus punch with 1 lbs 2 x 10  Trigger Point Dry Needling Subsequent Treatment: Instructions reviewed, if requested by the patient, prior to subsequent dry needling treatment.  Patient Verbal Consent Given: Yes Education Handout Provided: Previously Provided Muscles Treated:  All on right: upper trap, infraspinatus, teres, lats, rhomboids, pectoralis Electrical Stimulation Performed: No Treatment Response/Outcome: Skilled palpation used to identify taut bands and trigger points.  Once identified, dry needling techniques used to treat these areas.  Twitch response ellicited along with palpable elongation of muscle.  Following treatment, patient reported mild soreness.   11/22/2023: Pulleys into flex and abd x 2 mins each  Back against wall: field goal position, arm raises Fwd bent shoulder ext, rows, and horizontal abduction 2 x 10 with 1 lb  Side lying shoulder  ER  2 x 10 with 1 lb Supine serratus punch with 1 lbs 2 x 10 Trigger Point Dry Needling Subsequent Treatment: Instructions reviewed, if requested by the patient, prior to subsequent dry needling treatment.  Patient Verbal Consent Given: Yes Education Handout Provided: Previously Provided Muscles Treated: All on right: upper trap, supraspinatus, infraspinatus, teres, pectoralis Electrical Stimulation Performed: No Treatment Response/Outcome: Skilled palpation used to identify taut bands and trigger points.  Once identified, dry needling techniques used to treat these areas.  Twitch response ellicited along with palpable elongation of muscle.  Following treatment, patient reported mild soreness.    10/08/2023: Pulleys into flex and abd x 2 mins each  Supine over half foam roll for following: Bil UE horz abd x 10, then bil UE scaption into a V x 10, and last bil UE abd in a snow angel x 5 with 5 sec holds Trigger Point Dry Needling Subsequent Treatment: Pt instructed on Dry Needling rational, procedures, and possible side effects. Pt instructed to expect mild to moderate muscle soreness later  in the day and/or into the next day.  Pt instructed in methods to reduce muscle soreness. Pt instructed to continue prescribed HEP. Because Dry Needling was performed over or adjacent to a lung field, pt was educated on S/S of pneumothorax and to seek immediate medical attention should they occur.  Patient was educated on signs and symptoms of infection and other risk factors and advised to seek medical attention should they occur.  Patient verbalized understanding of these instructions and education.  Patient Verbal Consent Given: Yes Education Handout Provided: Yes Muscles Treated: right upper trap, right levator scap, right supraspinatus, right infraspinatus, right teres, right pectorals (distal insertion) Electrical Stimulation Performed: No Treatment Response/Outcome: Utilized skilled palpation to  identify bony landmarks and trigger points.  Able to illicit twitch response and muscle elongation.  Soft tissue mobilization with massage cream following to further promote tissue elongation.  09/27/2023: Seated Upper trap stretch 30 sec with manual overpressure 3x (added to HEP) Seated levator scap stretch 30 sec with manual overpressure 3x (added to HEP) Pectoral stretch on wall with head rotation away Doorway flexion stretch 30 sec hold (added to HEP) Trigger Point Dry Needling Subsequent Treatment: Pt instructed on Dry Needling rational, procedures, and possible side effects. Pt instructed to expect mild to moderate muscle soreness later in the day and/or into the next day.  Pt instructed in methods to reduce muscle soreness. Pt instructed to continue prescribed HEP. Because Dry Needling was performed over or adjacent to a lung field, pt was educated on S/S of pneumothorax and to seek immediate medical attention should they occur.  Patient was educated on signs and symptoms of infection and other risk factors and advised to seek medical attention should they occur.  Patient verbalized understanding of these instructions and education.  Patient Verbal Consent Given: Yes Education Handout Provided: Yes Muscles Treated: right upper trap, right levator scap, right supraspinatus, right infraspinatus, right teres, right pectorals (distal insertion) Electrical Stimulation Performed: No Treatment Response/Outcome: Utilized skilled palpation to identify bony landmarks and trigger points.  Able to illicit twitch response and muscle elongation.  Soft tissue mobilization with massage cream following to further promote tissue elongation.  PATIENT EDUCATION:  Education details: Supine scapular series with yellow theraband Person educated: Patient Education method: Explanation, Demonstration, Tactile cues, Verbal cues, and Handouts Education comprehension: verbalized understanding, returned  demonstration, and needs further education  HOME EXERCISE PROGRAM: Access Code: [email or web address] URL: [email or web address] Date: 09/28/2023 Prepared by: Glade Pesa  Exercises - Supine Shoulder Flexion Extension AAROM with Dowel  - 1 x daily - 7 x weekly - 2 sets - 10 reps - Supine Shoulder Horizontal Abduction with Resistance  - 1 x daily - 7 x weekly - 2 sets - 10 reps - Supine Shoulder External Rotation with Resistance  - 1 x daily - 7 x weekly - 2 sets - 10 reps - Standing Shoulder Single Arm PNF D2 Flexion with Resistance  - 1 x daily - 7 x weekly - 3 sets - 10 reps - Doorway Pec Stretch at 90 Degrees Abduction  - 1 x daily - 7 x weekly - 2 reps - 20 sec hold - Standing Pec Stretch at Wall  - 1 x daily - 7 x weekly - 2 reps - 20 sec hold - Seated Chest Stretch with Hands Behind Head  - 1 x daily - 7 x weekly - 2 reps - 20 sec hold - Standing Overhead Triceps Stretch  - 1 x daily - 7 x weekly -  2 reps - 20 sec hold - Seated Scalenes Stretch  - 1 x daily - 7 x weekly - 2 reps - 20 sec hold - Seated Cervical Sidebending Stretch  - 1 x daily - 7 x weekly - 1 sets - 3 reps - 30 hold - Seated Levator Scapulae Stretch  - 1 x daily - 7 x weekly - 1 sets - 3 reps - 30 hold - Standing Shoulder Flexion Stretch on Wall  - 1 x daily - 7 x weekly - 1 sets - 3 reps - 30 hold  ASSESSMENT:  CLINICAL IMPRESSION:  Patient reports significant hematoma after last DN session, but fully resolved except for mild bruise today (supraspinatus/UT area). Reviewed new bent over exercises for correct form and added to HEP. She did need verbal and tactile cues for correct form. She continues to have significant trigger points in her infraspinatus and rhomboids. Improved in pecs and UT today. She continues to demonstrate potential for improvement and would benefit from continued skilled therapy to address impairments.       OBJECTIVE IMPAIRMENTS: decreased activity tolerance, decreased knowledge of  condition, increased fascial restrictions, and pain.   ACTIVITY LIMITATIONS: carrying, lifting, and reach over head  PARTICIPATION LIMITATIONS: community activity and occupation  PERSONAL FACTORS: 1 comorbidity: radiation hx are also affecting patient's functional outcome.   REHAB POTENTIAL: Excellent  CLINICAL DECISION MAKING: Stable/uncomplicated  EVALUATION COMPLEXITY: Low  GOALS: Goals reviewed with patient? Yes  SHORT TERM GOALS = LTGs: Target date: 12/24/2023     Pt will be ind with final stretches to improve Rt upper quadrant tolerance to work  Baseline: Goal status: In progress 11/19/2023  2.  Pt will report return to baseline work status including some mild UT and posterior shoulder pain but without Rt axillary pain and stretching  Baseline:  Goal status: in progress 11/19/2023  3.  Pt will report pain in the arm and chest of 2/10 or less at worst  Baseline: 0-4/10 (10/07/13 Goal status: in progress 11/19/2023 4. Report > or = to 75% reduction in Rt UE pain with work tasks   Baseline: 50-60% reduction  Goal Status: n progress 11/19/2023  PLAN:  PT FREQUENCY: 1x/week due to work   PT DURATION: other: 5 weeks  PLANNED INTERVENTIONS: 97110-Therapeutic exercises, 97535- Self Care, [id number]- Manual therapy, Patient/Family education, Joint mobilization, Therapeutic exercises, Therapeutic activity, Neuromuscular re-education, Gait training, and Self Care, dry needling   PLAN FOR NEXT SESSION: Assess response to DN. Progress TE as tolerated.  Pt will see ortho for dry needling for up to 4 weeks and will start pelvic floor in June.  Advance HEP as needed   Mliss Cummins, PT 12/10/23 2:11 PM [facility] [address], [address] [place], [place] [address] Phone # [phone] Fax [phone]

## 2023-12-10 ENCOUNTER — Encounter: Payer: Self-pay | Admitting: Physical Therapy

## 2023-12-10 ENCOUNTER — Ambulatory Visit: Admitting: Physical Therapy

## 2023-12-10 DIAGNOSIS — D0511 Intraductal carcinoma in situ of right breast: Secondary | ICD-10-CM

## 2023-12-10 DIAGNOSIS — M25611 Stiffness of right shoulder, not elsewhere classified: Secondary | ICD-10-CM | POA: Diagnosis not present

## 2023-12-10 DIAGNOSIS — M25511 Pain in right shoulder: Secondary | ICD-10-CM

## 2023-12-10 DIAGNOSIS — I890 Lymphedema, not elsewhere classified: Secondary | ICD-10-CM | POA: Diagnosis not present

## 2023-12-10 DIAGNOSIS — M6281 Muscle weakness (generalized): Secondary | ICD-10-CM | POA: Diagnosis not present

## 2023-12-10 DIAGNOSIS — R252 Cramp and spasm: Secondary | ICD-10-CM

## 2023-12-17 DIAGNOSIS — L814 Other melanin hyperpigmentation: Secondary | ICD-10-CM | POA: Diagnosis not present

## 2023-12-17 DIAGNOSIS — I788 Other diseases of capillaries: Secondary | ICD-10-CM | POA: Diagnosis not present

## 2023-12-17 DIAGNOSIS — Z85828 Personal history of other malignant neoplasm of skin: Secondary | ICD-10-CM | POA: Diagnosis not present

## 2023-12-17 DIAGNOSIS — D485 Neoplasm of uncertain behavior of skin: Secondary | ICD-10-CM | POA: Diagnosis not present

## 2023-12-17 DIAGNOSIS — L570 Actinic keratosis: Secondary | ICD-10-CM | POA: Diagnosis not present

## 2023-12-17 DIAGNOSIS — L821 Other seborrheic keratosis: Secondary | ICD-10-CM | POA: Diagnosis not present

## 2023-12-24 ENCOUNTER — Ambulatory Visit: Admitting: Physical Therapy

## 2023-12-28 ENCOUNTER — Ambulatory Visit: Admitting: Physical Therapy

## 2024-01-07 ENCOUNTER — Ambulatory Visit: Admitting: Physical Therapy

## 2024-01-14 ENCOUNTER — Ambulatory Visit: Admitting: Physical Therapy

## 2024-01-21 ENCOUNTER — Encounter: Admitting: Physical Therapy

## 2024-02-07 ENCOUNTER — Encounter: Payer: Self-pay | Admitting: Radiation Oncology

## 2024-02-07 ENCOUNTER — Telehealth: Payer: Self-pay

## 2024-02-07 ENCOUNTER — Telehealth: Payer: Self-pay | Admitting: Radiation Oncology

## 2024-02-07 ENCOUNTER — Ambulatory Visit
Admission: RE | Admit: 2024-02-07 | Discharge: 2024-02-07 | Disposition: A | Discharge status: Home / Self Care | Source: Ambulatory Visit | Attending: Radiation Oncology | Admitting: Radiation Oncology

## 2024-02-07 VITALS — BP 126/64 | HR 77 | Temp 98.5°F | Resp 19 | Ht 60.0 in | Wt 149.2 lb

## 2024-02-07 DIAGNOSIS — R0602 Shortness of breath: Secondary | ICD-10-CM

## 2024-02-07 DIAGNOSIS — Z170 Estrogen receptor positive status [ER+]: Secondary | ICD-10-CM | POA: Diagnosis not present

## 2024-02-07 DIAGNOSIS — D0511 Intraductal carcinoma in situ of right breast: Secondary | ICD-10-CM | POA: Diagnosis not present

## 2024-02-07 NOTE — Progress Notes (Signed)
 Radiation Oncology         [phone] ________________________________  Name: Amy Lang MRN: [id number]  Date: 02/07/2024  DOB: 11/13/1965  Follow-Up Visit Note  CC: Plotnikov, [name], MD  Ebbie Cough, MD    ICD-10-CM   1. Ductal carcinoma in situ of right breast  D05.11        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: s/p right lumpectomy without nodal biopsies and radiation treatment  Interval Since Last Radiation: 2 months and 9 days   Radiation Treatment Dates: First Treatment Date: 2023-05-08 -- Last Treatment Date: 2023-06-06  Site/Dose/Technique/Mode:   Site: Breast, Right Technique: 3D Mode: Photon Dose Per Fraction: 2.67 Gy Prescribed Dose (Delivered / Prescribed): 40.05 Gy / 40.05 Gy Prescribed Fxs (Delivered / Prescribed): 15 / 15   Site: Breast, Right - Boost  Technique: 3D Mode: Photon Dose Per Fraction: 2 Gy Prescribed Dose (Delivered / Prescribed): 10 Gy / 10 Gy Prescribed Fxs (Delivered / Prescribed): 5 / 5  Narrative:  The patient returns today for follow-up due to right-sided chest and pack pain with difficulty breathing.   Patient states she woke up on Sunday morning with right sided chest pain. The pain starts at her right midline and radiates to her spine. She states the pain is deep in nature and has not responded to Tylenol . 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 Pain is aggravated with activity. She has received her shingles vaccine and has no known shingles infections. She denies any trauma to her chest/back. She denies any cough, fevers, or chills.                  Allergies:  is allergic to bee pollen, butorphanol, butorphanol tartrate, codeine, codeine sulfate, crestor [rosuvastatin], darvocet [propoxyphene n-acetaminophen ], estrogens, levofloxacin, metformin  and related, penicillamine, penicillins,  sulfa antibiotics, and latex.  Meds: Current Outpatient Medications  Medication Sig Dispense Refill   albuterol  (VENTOLIN  HFA) 108 (90 Base) MCG/ACT inhaler TAKE 2 PUFFS BY MOUTH EVERY 6 HOURS AS NEEDED FOR WHEEZE OR SHORTNESS OF BREATH 54 each 3   ALPRAZolam  (XANAX ) 0.5 MG tablet Take 1 tablet (0.5 mg total) by mouth 2 (two) times daily as needed for anxiety. 60 tablet 3   amphetamine -dextroamphetamine  (ADDERALL) 10 MG tablet Take 1 tablet (10 mg total) by mouth 2 (two) times daily. 60 tablet 0   aspirin  EC 81 MG tablet Take 81 mg by mouth every evening.     cetirizine (ZYRTEC) 10 MG tablet Take 10 mg by mouth daily.     Cholecalciferol (VITAMIN D3) 50 MCG (2000 UT) capsule Take 1 capsule (2,000 Units total) by mouth daily. 100 capsule 3   cyanocobalamin  (VITAMIN B12) 1000 MCG/ML injection INJECT 1 ML INTO MUSCLE OR SUBCUTANEOUSLY EVERY 2 WEEKS (LIFETIME) 6 mL 3   empagliflozin  (JARDIANCE ) 10 MG TABS tablet Take 1 tablet (10 mg total) by mouth daily. 90 tablet 3   FLUoxetine  (PROZAC ) 40 MG capsule TAKE 1 CAPSULE BY MOUTH EVERY DAY 90 capsule 1   fluticasone  (FLONASE ) 50 MCG/ACT nasal spray SPRAY 2 SPRAYS INTO EACH NOSTRIL EVERY DAY 48 mL 3   gabapentin  (NEURONTIN ) 100 MG capsule Take 1-2 capsules (100-200 mg total) by mouth at bedtime. (Patient taking differently: Take 100-200 mg by mouth daily as needed.) 180 capsule 1   gabapentin  (NEURONTIN ) 300 MG capsule Take 1 capsule (300  mg total) by mouth at bedtime. 30 capsule 1   losartan  (COZAAR ) 50 MG tablet TAKE 2 TABLETS (100MG ) BY MOUTH EVERY DAY 180 tablet 1   Multiple Vitamins-Minerals (PRESERVISION AREDS 2) CAPS Take 1 capsule by mouth at bedtime.     Omega-3 Fatty Acids (FISH OIL) 1000 MG CAPS Take 1,000 mg by mouth daily.     ondansetron  (ZOFRAN ) 4 MG tablet Take 1 tablet (4 mg total) by mouth every 8 (eight) hours as needed for nausea or vomiting. 20 tablet 1   pantoprazole  (PROTONIX ) 40 MG tablet Take 1 tablet (40 mg total) by mouth  daily. 90 tablet 3   Probiotic Product (PROBIOTIC ADVANCED PO) Take by mouth daily.     Semaglutide , 2 MG/DOSE, 8 MG/3ML SOPN 2 mg sq weekly 9 mL 3   SYRINGE-NEEDLE, DISP, 3 ML (BD ECLIPSE SYRINGE) 25G X 1 3 ML MISC Use sq for B12 inj 50 each 3   tamoxifen  (NOLVADEX ) 10 MG tablet Take 0.5 tablets (5 mg total) by mouth daily. 90 tablet 1   topiramate  (TOPAMAX ) 200 MG tablet Take 1 tablet (200 mg total) by mouth daily. 90 tablet 3   triamcinolone  ointment (KENALOG ) 0.5 % Apply 1 Application topically 2 (two) times daily. 30 g 2   venlafaxine  XR (EFFEXOR  XR) 75 MG 24 hr capsule Take 1 capsule (75 mg total) by mouth daily with breakfast. 90 capsule 3   venlafaxine  XR (EFFEXOR -XR) 37.5 MG 24 hr capsule TAKE 1 CAPSULE BY MOUTH DAILY WITH BREAKFAST. 90 capsule 2   vitamin C (ASCORBIC ACID) 500 MG tablet Take 500 mg by mouth daily.     vitamin E 1000 UNIT capsule Take 1,000 Units by mouth daily.     Zinc 50 MG CAPS Take by mouth.     No current facility-administered medications for this encounter.    Physical Findings: The patient is in no acute distress. Patient is alert and oriented.  height is 5' (1.524 m) and weight is 149 lb 3.2 oz (67.7 kg). Her oral temperature is 98.5 F (36.9 C). Her blood pressure is 126/64 and her pulse is 77. Her respiration is 19 and oxygen saturation is 100%. .  No significant changes. Lungs are clear to auscultation bilaterally. Heart has regular rate and rhythm. No palpable cervical, supraclavicular, or axillary adenopathy. Abdomen soft, non-tender, normal bowel sounds.   No concerning skin lesions. Good ROM in right shoulder. Pain with deep palpation to the right side of the chest wall.    Lab Findings: Lab Results  Component Value Date   WBC 5.4 11/26/2023   HGB 14.0 11/26/2023   HCT 42.3 11/26/2023   MCV 91.1 11/26/2023   PLT 161.0 11/26/2023    Radiographic Findings: No results found.  Impression/Plan: Stage 0 (cTis (DCIS), cN0, cM0) Right Breast,  High grade ductal carcinoma in situ, ER+ / PR- / Her2 not assessed: s/p right lumpectomy without nodal biopsies and adjuvant radiation completed on 06/06/2023  Patient presents today with new onset right sided chest pain. Pain seems to be musculoskeletal in nature. We will get a CT of the chest for further evaluation. Recommend increasing her Gabapentin  from 100 to 300 mg and using Advil  PRN to help with pain control. Patient expressed understanding and is in agreement with the stated plan.    We personally spent 30 minutes in this encounter including chart review, reviewing radiological studies, meeting face-to-face with the patient, entering orders and completing documentation.  ____________________________________   Leeroy Due, PA-C  Lynwood CHARM Nasuti, PhD, MD   [facility]  Radiation Oncology Direct Dial: [phone]  Fax: [phone] [email or web address]    This document serves as a record of services personally performed by Lynwood Nasuti, MD and Leeroy Due, PA-C. It was created on his behalf by Dorthy Fuse, a trained medical scribe. The creation of this record is based on the scribe's personal observations and the provider's statements to them. This document has been checked and approved by the attending provider.

## 2024-02-07 NOTE — Progress Notes (Signed)
 Amy Lang is here today for follow up post radiation to the breast.   Breast Side:Right    They completed their radiation on: 06/06/23   Does the patient complain of any of the following: Post radiation skin issues: No Breast Tenderness: Yes Breast Swelling: No Lymphadema: No Range of Motion limitations: No Fatigue post radiation: Yes Appetite good/fair/poor: Good  Additional comments if applicable : Patient reports severe pain to right chest and back. Reports shortness of breath with movement.    BP 126/64 (BP Location: Left Arm, Patient Position: Sitting)   Pulse 77   Temp 98.5 F (36.9 C) (Oral)   Resp 19   Ht 5' (1.524 m)   Wt 149 lb 3.2 oz (67.7 kg)   SpO2 100%   BMI 29.14 kg/m

## 2024-02-07 NOTE — Telephone Encounter (Signed)
 Received VM from pt advising of right sided chest and back pain with difficulty breathing. Called patient back and encouraged her to report to the ED for evaluation.

## 2024-02-07 NOTE — Telephone Encounter (Signed)
 Received VM from pt advising of R side chest and back pain with difficulty breathing. Call escalated to nurse Eudora who will advise with Dr. Shannon and call pt.

## 2024-02-08 ENCOUNTER — Telehealth: Payer: Self-pay | Admitting: *Deleted

## 2024-02-08 NOTE — Telephone Encounter (Signed)
 CALLED PATIENT TO INFORM OF CT FOR 02-11-24- ARRIVAL TIME- 6:45 AM @ [facility], NO RESTRICTIONS TO SCAN, PATIENT TO RECEIVE RESULTS FROM PROVIDER ON 02-11-24 @ 2:45 PM VIA TELEPHONE FOR RESULTS, SPOKE WITH PATIENT AND SHE IS AWARE OF THESE APPTS. AND THE INSTRUCTIONS

## 2024-02-10 NOTE — Progress Notes (Signed)
 Radiation Oncology         [phone] ________________________________  Name: Amy Lang MRN: [id number]  Date: 02/11/2024  DOB: [birth year]  Follow-Up Visit Note  CC: Plotnikov, [name], MD  Ebbie Cough, MD    ICD-10-CM   1. 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: s/p right lumpectomy without nodal biopsies and radiation treatment   Interval Since Last Radiation: 8 months and 7 days   Radiation Treatment Dates: First Treatment Date: 2023-05-08 -- Last Treatment Date: 2023-06-06   Site/Dose/Technique/Mode:  Site: Breast, Right Technique: 3D Mode: Photon Dose Per Fraction: 2.67 Gy Prescribed Dose (Delivered / Prescribed): 40.05 Gy / 40.05 Gy Prescribed Fxs (Delivered / Prescribed): 15 / 15   Site: Breast, Right - Boost  Technique: 3D Mode: Photon Dose Per Fraction: 2 Gy Prescribed Dose (Delivered / Prescribed): 10 Gy / 10 Gy Prescribed Fxs (Delivered / Prescribed): 5 / 5  Narrative:  The patient is being contacted today for a telephone follow-up visit to review recent imaging. She was last seen here for a follow-up visit on 02/07/24 due to new right-sided chest and back pain with difficulty breathing. Although her pain seemed to be musculoskeletal in etiology, a chest CT was recommended for further evaluation of her symptoms which we will review together today.   She presented for her scheduled chest CT earlier today. CT findings demonstrated: no acute findings in the chest. A 3 mm LUL perifissural nodule was noted and noted to be likely benign in nature.   Since we last saw the patient she denies any improvement in her symptoms. She notes that lifting her shoulder exacerbates the pain. She denies any new skin lesions. She has been taking 300 mg Gabapentin  and Ibuprofen  PRN, as well as applying ice to the area of pain.                                   Allergies:  is allergic to bee pollen, butorphanol, butorphanol tartrate, codeine, codeine sulfate, crestor [rosuvastatin], darvocet [propoxyphene n-acetaminophen ], estrogens, levofloxacin, metformin  and related, penicillamine, penicillins, sulfa antibiotics, and latex.  Meds: Current Outpatient Medications  Medication Sig Dispense Refill   albuterol  (VENTOLIN  HFA) 108 (90 Base) MCG/ACT inhaler TAKE 2 PUFFS BY MOUTH EVERY 6 HOURS AS NEEDED FOR WHEEZE OR SHORTNESS OF BREATH 54 each 3   ALPRAZolam  (XANAX ) 0.5 MG tablet Take 1 tablet (0.5 mg total) by mouth 2 (two) times daily as needed for anxiety. 60 tablet 3   amphetamine -dextroamphetamine  (ADDERALL) 10 MG tablet Take 1 tablet (10 mg total) by mouth 2 (two) times daily. 60 tablet 0   aspirin  EC 81 MG tablet Take 81 mg by mouth every evening.     cetirizine (ZYRTEC) 10 MG tablet Take 10 mg by mouth daily.     Cholecalciferol (VITAMIN D3) 50 MCG (2000 UT) capsule Take 1 capsule (2,000 Units total) by mouth daily. 100 capsule 3   cyanocobalamin  (VITAMIN B12) 1000 MCG/ML injection INJECT 1 ML INTO MUSCLE OR SUBCUTANEOUSLY EVERY 2 WEEKS (LIFETIME) 6 mL 3   empagliflozin  (JARDIANCE ) 10 MG TABS tablet Take 1 tablet (10 mg total) by mouth daily. 90 tablet 3   FLUoxetine  (PROZAC ) 40 MG capsule TAKE 1 CAPSULE BY MOUTH EVERY DAY 90 capsule  1   fluticasone  (FLONASE ) 50 MCG/ACT nasal spray SPRAY 2 SPRAYS INTO EACH NOSTRIL EVERY DAY 48 mL 3   gabapentin  (NEURONTIN ) 100 MG capsule Take 1-2 capsules (100-200 mg total) by mouth at bedtime. (Patient taking differently: Take 100-200 mg by mouth daily as needed.) 180 capsule 1   gabapentin  (NEURONTIN ) 300 MG capsule Take 1 capsule (300 mg total) by mouth at bedtime. 30 capsule 1   losartan  (COZAAR ) 50 MG tablet TAKE 2 TABLETS (100MG ) BY MOUTH EVERY DAY 180 tablet 1   Multiple Vitamins-Minerals (PRESERVISION AREDS 2) CAPS Take 1 capsule by mouth at bedtime.     Omega-3 Fatty Acids (FISH  OIL) 1000 MG CAPS Take 1,000 mg by mouth daily.     ondansetron  (ZOFRAN ) 4 MG tablet Take 1 tablet (4 mg total) by mouth every 8 (eight) hours as needed for nausea or vomiting. 20 tablet 1   pantoprazole  (PROTONIX ) 40 MG tablet Take 1 tablet (40 mg total) by mouth daily. 90 tablet 3   Probiotic Product (PROBIOTIC ADVANCED PO) Take by mouth daily.     Semaglutide , 2 MG/DOSE, 8 MG/3ML SOPN 2 mg sq weekly 9 mL 3   SYRINGE-NEEDLE, DISP, 3 ML (BD ECLIPSE SYRINGE) 25G X 1 3 ML MISC Use sq for B12 inj 50 each 3   tamoxifen  (NOLVADEX ) 10 MG tablet Take 0.5 tablets (5 mg total) by mouth daily. 90 tablet 1   topiramate  (TOPAMAX ) 200 MG tablet Take 1 tablet (200 mg total) by mouth daily. 90 tablet 3   triamcinolone  ointment (KENALOG ) 0.5 % Apply 1 Application topically 2 (two) times daily. 30 g 2   venlafaxine  XR (EFFEXOR  XR) 75 MG 24 hr capsule Take 1 capsule (75 mg total) by mouth daily with breakfast. 90 capsule 3   venlafaxine  XR (EFFEXOR -XR) 37.5 MG 24 hr capsule TAKE 1 CAPSULE BY MOUTH DAILY WITH BREAKFAST. 90 capsule 2   vitamin C (ASCORBIC ACID) 500 MG tablet Take 500 mg by mouth daily.     vitamin E 1000 UNIT capsule Take 1,000 Units by mouth daily.     Zinc 50 MG CAPS Take by mouth.     No current facility-administered medications for this encounter.    Physical Findings: The patient is in no acute distress. Patient is alert and oriented.  vitals were not taken for this visit. SABRA Deferred, due to nature of the telephone visit.   Lab Findings: Lab Results  Component Value Date   WBC 5.4 11/26/2023   HGB 14.0 11/26/2023   HCT 42.3 11/26/2023   MCV 91.1 11/26/2023   PLT 161.0 11/26/2023    Radiographic Findings: CT Chest W Contrast Result Date: 02/11/2024 CLINICAL DATA:  Shortness of breath. Breast cancer. Chest pain. * Tracking Code: BO * EXAM: CT CHEST WITH CONTRAST TECHNIQUE: Multidetector CT imaging of the chest was performed during intravenous contrast administration. RADIATION  DOSE REDUCTION: This exam was performed according to the departmental dose-optimization program which includes automated exposure control, adjustment of the mA and/or kV according to patient size and/or use of iterative reconstruction technique. CONTRAST:  75mL OMNIPAQUE  IOHEXOL  300 MG/ML  SOLN COMPARISON:  No comparison studies available. FINDINGS: Cardiovascular: The heart size is normal. No substantial pericardial effusion. No thoracic aortic aneurysm. No substantial atherosclerotic calcification of the thoracic aorta. Mediastinum/Nodes: No mediastinal lymphadenopathy. There is no hilar lymphadenopathy. The esophagus has normal imaging features. There is no axillary lymphadenopathy. Lungs/Pleura: 3 mm left upper lobe perifissural nodule is compatible with a subpleural lymph node. Lungs  otherwise clear. Upper Abdomen: Visualized portion of the upper abdomen shows no acute findings. Musculoskeletal: No worrisome lytic or sclerotic osseous abnormality. IMPRESSION: 1. No acute findings in the chest. Specifically, no findings to explain the patient's history of shortness of breath and chest pain. 2. 3 mm left upper lobe perifissural nodule is almost assuredly a benign subpleural lymph node. Given the history of breast cancer, consider follow-up CT chest in 3 months to ensure stability. Electronically Signed   By: Camellia Candle M.D.   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 Pain is exacerbated with movement and appears to be musculoskeletal in nature. Recommended follow-up with PCP for further evaluation as this is unlikely a result of her radiation treatment. Discussed reasons to visit the ER such as shortness of breath and constant chest pain.Patient expressed  understanding and is in agreement with this stated plan.    20 minutes of total time was spent for this patient encounter, including preparation, face-to-face counseling with the patient and coordination of care, physical exam, and documentation of the encounter. ____________________________________   Leeroy Due, PA-C    This document serves as a record of services personally performed by Leeroy Due, PA-C. It was created on his behalf by Dorthy Fuse, a trained medical scribe. The creation of this record is based on the scribe's personal observations and the provider's statements to them. This document has been checked and approved by the attending provider.

## 2024-02-11 ENCOUNTER — Ambulatory Visit
Admission: RE | Admit: 2024-02-11 | Discharge: 2024-02-11 | Disposition: A | Discharge status: Home / Self Care | Source: Ambulatory Visit | Attending: Radiation Oncology | Admitting: Radiation Oncology

## 2024-02-11 ENCOUNTER — Ambulatory Visit (HOSPITAL_COMMUNITY)
Admission: RE | Admit: 2024-02-11 | Discharge: 2024-02-11 | Disposition: A | Discharge status: Home / Self Care | Source: Ambulatory Visit | Attending: Radiology | Admitting: Radiology

## 2024-02-11 DIAGNOSIS — R0602 Shortness of breath: Secondary | ICD-10-CM | POA: Insufficient documentation

## 2024-02-11 DIAGNOSIS — D0511 Intraductal carcinoma in situ of right breast: Secondary | ICD-10-CM | POA: Insufficient documentation

## 2024-02-11 DIAGNOSIS — R911 Solitary pulmonary nodule: Secondary | ICD-10-CM | POA: Diagnosis not present

## 2024-02-11 DIAGNOSIS — R079 Chest pain, unspecified: Secondary | ICD-10-CM | POA: Diagnosis not present

## 2024-02-11 LAB — POCT I-STAT CREATININE: Creatinine, Ser: 0.7 mg/dL (ref 0.44–1.00)

## 2024-02-11 MED ORDER — IOHEXOL 300 MG/ML  SOLN
75.0000 mL | Freq: Once | INTRAMUSCULAR | Status: AC | PRN
  Administered 2024-02-11: 75 mL via INTRAVENOUS

## 2024-02-11 NOTE — Progress Notes (Signed)
 Amy Lang is here today for follow up post radiation to the breast.   Breast Side:Right    They completed their radiation on: 06/06/23   Does the patient complain of any of the following: Post radiation skin issues: No Breast Tenderness: Yes Breast Swelling: No Lymphadema: No Range of Motion limitations: No Fatigue post radiation: Yes Appetite good/fair/poor: Good  Additional comments if applicable : Patient reports pain to right chest and back. Reports shortness of breath with movement.    Patient to receive CT results.

## 2024-02-13 ENCOUNTER — Ambulatory Visit: Admitting: Internal Medicine

## 2024-02-13 ENCOUNTER — Encounter: Payer: Self-pay | Admitting: Internal Medicine

## 2024-02-13 ENCOUNTER — Ambulatory Visit: Payer: Self-pay

## 2024-02-13 VITALS — BP 146/76 | HR 69 | Temp 98.5°F | Ht 60.0 in | Wt 147.8 lb

## 2024-02-13 DIAGNOSIS — G8929 Other chronic pain: Secondary | ICD-10-CM

## 2024-02-13 DIAGNOSIS — E538 Deficiency of other specified B group vitamins: Secondary | ICD-10-CM

## 2024-02-13 DIAGNOSIS — E119 Type 2 diabetes mellitus without complications: Secondary | ICD-10-CM

## 2024-02-13 DIAGNOSIS — Z23 Encounter for immunization: Secondary | ICD-10-CM | POA: Diagnosis not present

## 2024-02-13 DIAGNOSIS — M7918 Myalgia, other site: Secondary | ICD-10-CM | POA: Diagnosis not present

## 2024-02-13 DIAGNOSIS — M546 Pain in thoracic spine: Secondary | ICD-10-CM

## 2024-02-13 DIAGNOSIS — Z7985 Long-term (current) use of injectable non-insulin antidiabetic drugs: Secondary | ICD-10-CM

## 2024-02-13 DIAGNOSIS — D0511 Intraductal carcinoma in situ of right breast: Secondary | ICD-10-CM

## 2024-02-13 DIAGNOSIS — Z7984 Long term (current) use of oral hypoglycemic drugs: Secondary | ICD-10-CM

## 2024-02-13 DIAGNOSIS — F413 Other mixed anxiety disorders: Secondary | ICD-10-CM

## 2024-02-13 MED ORDER — VORTIOXETINE HBR 10 MG PO TABS: 10.0000 mg | ORAL_TABLET | Freq: Every day | ORAL | 5 refills | Status: AC

## 2024-02-13 MED ORDER — CELECOXIB 200 MG PO CAPS
200.0000 mg | ORAL_CAPSULE | Freq: Two times a day (BID) | ORAL | 1 refills | Status: DC | PRN
Start: 2024-02-13 — End: 2024-04-12

## 2024-02-13 MED ORDER — OXYCODONE-ACETAMINOPHEN 5-325 MG PO TABS: 1.0000 | ORAL_TABLET | Freq: Three times a day (TID) | ORAL | 0 refills | Status: AC | PRN

## 2024-02-13 NOTE — Assessment & Plan Note (Signed)
 D/c Effexor  - problems w/drug interactions Start trintellix 

## 2024-02-13 NOTE — Telephone Encounter (Signed)
 FYI Only or Action Required?: Action required by provider: clinical question for provider. And request for appointment  Patient was last seen in primary care on 02/13/2024 by Plotnikov, [name], MD.  Called Nurse Triage reporting Advice Only.  Symptoms began n/a.  Interventions attempted: Other: n/a.  Symptoms are: n/a.  Triage Disposition: Call PCP When Office is Open  Patient/caregiver understands and will follow disposition?: Yes   Copied from CRM [id number]. Topic: Clinical - Red Word Triage >> Feb 13, 2024 10:22 AM Richerd A wrote: [facility] that prompted transfer to Nurse Triage: Patient had appt today and left appt regarding pain in her back. Reason for Disposition  [1] Caller has NON-URGENT medicine question about med that PCP prescribed AND [2] triager unable to answer question  Answer Assessment - Initial Assessment Questions Patient just left appointment with Dr. Garald and states that he offered her an injection for her pain.    No mention of injection from recent office visit note.   Contacted CAL, Erin, to verify process of scheduling injection  Sending this CRM for follow up  Protocols used: Medication Question Call-A-AH

## 2024-02-13 NOTE — Assessment & Plan Note (Signed)
 On B12

## 2024-02-13 NOTE — Assessment & Plan Note (Addendum)
 R side - severe,  pain in the R shoulder, chronic worse  x2 wks, started during  XRT treatment (Dr. Shannon wants you to get her an MRI. Patient here for pain management. Pain under right side of arm radiates to her back. Pain started ever since she had radiation done and it was coming and going, but this time it has not stopped. Pain Scale: 8. ) Pain is worse after activity/work.  Pt had a CT Sports med ref Celebrex  po Perciocet low dose prn D/c Effexor . Start Trintellix 

## 2024-02-13 NOTE — Assessment & Plan Note (Addendum)
 R side - severe,  pain in the R shoulder, chronic worse  x2 wks, started during  XRT treatment (Dr. Shannon wants to get her an MRI. Patient here for pain management. Pain under right side of arm radiates to her back. Pain started ever since she had radiation done and it was coming and going, but this time it has not stopped. Pain Scale: 8. ) Pain is worse after activity/work.  Pt had a CT Sports med ref Celebrex  po Perciocet low dose prn D/c Effexor . Start Trintellix 

## 2024-02-13 NOTE — Addendum Note (Signed)
 Addended by: BEVELY CURTISTINE PARAS on: 02/13/2024 09:47 AM   Modules accepted: Orders

## 2024-02-13 NOTE — Assessment & Plan Note (Addendum)
  On Jardiance , Ozempic   -  2 mg/2 wk

## 2024-02-13 NOTE — Assessment & Plan Note (Signed)
 MRI ordered per Dr Colton sugggestion

## 2024-02-13 NOTE — Progress Notes (Signed)
 Subjective:  Patient ID: Amy Lang, female    DOB: 1965-09-06  Age: 58 y.o. MRN: [id number]  CC: Pain Management (Dr. Shannon wants you to get her an MRI. Patient here for pain management. Pain under right side of arm radiates to her back. Pain started ever since she had radiation done and it was coming and going, but this time it has not stopped. Pain Scale: 8. )   HPI Amy Lang presents for Pain Management - pain in the R shoulder, chronic worse  x2 wks, started during  XRT treatment (Dr. Shannon wants you to get her an MRI. Patient here for pain management. Pain under right side of arm radiates to her back. Pain started ever since she had radiation done and it was coming and going, but this time it has not stopped. Pain Scale: 8. ) Pain is worse after activity/work.  Pt had a CT  Outpatient Medications Prior to Visit  Medication Sig Dispense Refill   albuterol  (VENTOLIN  HFA) 108 (90 Base) MCG/ACT inhaler TAKE 2 PUFFS BY MOUTH EVERY 6 HOURS AS NEEDED FOR WHEEZE OR SHORTNESS OF BREATH 54 each 3   ALPRAZolam  (XANAX ) 0.5 MG tablet Take 1 tablet (0.5 mg total) by mouth 2 (two) times daily as needed for anxiety. 60 tablet 3   amphetamine -dextroamphetamine  (ADDERALL) 10 MG tablet Take 1 tablet (10 mg total) by mouth 2 (two) times daily. 60 tablet 0   aspirin  EC 81 MG tablet Take 81 mg by mouth every evening.     cetirizine (ZYRTEC) 10 MG tablet Take 10 mg by mouth daily.     Cholecalciferol (VITAMIN D3) 50 MCG (2000 UT) capsule Take 1 capsule (2,000 Units total) by mouth daily. 100 capsule 3   cyanocobalamin  (VITAMIN B12) 1000 MCG/ML injection INJECT 1 ML INTO MUSCLE OR SUBCUTANEOUSLY EVERY 2 WEEKS (LIFETIME) 6 mL 3   empagliflozin  (JARDIANCE ) 10 MG TABS tablet Take 1 tablet (10 mg total) by mouth daily. 90 tablet 3   fluticasone  (FLONASE ) 50 MCG/ACT nasal spray SPRAY 2 SPRAYS INTO EACH NOSTRIL EVERY DAY 48 mL 3   gabapentin  (NEURONTIN ) 300 MG capsule Take 1 capsule (300 mg total) by mouth  at bedtime. 30 capsule 1   losartan  (COZAAR ) 50 MG tablet TAKE 2 TABLETS (100MG ) BY MOUTH EVERY DAY 180 tablet 1   Multiple Vitamins-Minerals (PRESERVISION AREDS 2) CAPS Take 1 capsule by mouth at bedtime.     Omega-3 Fatty Acids (FISH OIL) 1000 MG CAPS Take 1,000 mg by mouth daily.     ondansetron  (ZOFRAN ) 4 MG tablet Take 1 tablet (4 mg total) by mouth every 8 (eight) hours as needed for nausea or vomiting. 20 tablet 1   pantoprazole  (PROTONIX ) 40 MG tablet Take 1 tablet (40 mg total) by mouth daily. 90 tablet 3   Probiotic Product (PROBIOTIC ADVANCED PO) Take by mouth daily.     Semaglutide , 2 MG/DOSE, 8 MG/3ML SOPN 2 mg sq weekly 9 mL 3   SYRINGE-NEEDLE, DISP, 3 ML (BD ECLIPSE SYRINGE) 25G X 1 3 ML MISC Use sq for B12 inj 50 each 3   tamoxifen  (NOLVADEX ) 10 MG tablet Take 0.5 tablets (5 mg total) by mouth daily. 90 tablet 1   topiramate  (TOPAMAX ) 200 MG tablet Take 1 tablet (200 mg total) by mouth daily. 90 tablet 3   triamcinolone  ointment (KENALOG ) 0.5 % Apply 1 Application topically 2 (two) times daily. 30 g 2   vitamin C (ASCORBIC ACID) 500 MG tablet Take 500 mg  by mouth daily.     vitamin E 1000 UNIT capsule Take 1,000 Units by mouth daily.     Zinc 50 MG CAPS Take by mouth.     venlafaxine  XR (EFFEXOR -XR) 37.5 MG 24 hr capsule TAKE 1 CAPSULE BY MOUTH DAILY WITH BREAKFAST. 90 capsule 2   gabapentin  (NEURONTIN ) 100 MG capsule Take 1-2 capsules (100-200 mg total) by mouth at bedtime. (Patient taking differently: Take 100-200 mg by mouth daily as needed.) 180 capsule 1   FLUoxetine  (PROZAC ) 40 MG capsule TAKE 1 CAPSULE BY MOUTH EVERY DAY (Patient not taking: Reported on 02/13/2024) 90 capsule 1   venlafaxine  XR (EFFEXOR  XR) 75 MG 24 hr capsule Take 1 capsule (75 mg total) by mouth daily with breakfast. (Patient not taking: Reported on 02/13/2024) 90 capsule 3   No facility-administered medications prior to visit.    ROS: Review of Systems  Constitutional:  Negative for activity change,  appetite change, chills, fatigue and unexpected weight change.  HENT:  Negative for congestion, mouth sores and sinus pressure.   Eyes:  Negative for visual disturbance.  Respiratory:  Negative for cough and chest tightness.   Gastrointestinal:  Negative for abdominal pain and nausea.  Genitourinary:  Negative for difficulty urinating, frequency and vaginal pain.  Musculoskeletal:  Positive for arthralgias, back pain and myalgias. Negative for gait problem.  Skin:  Negative for pallor and rash.  Neurological:  Negative for dizziness, tremors, weakness, numbness and headaches.  Psychiatric/Behavioral:  Negative for confusion, sleep disturbance and suicidal ideas. The patient is nervous/anxious.     Objective:  BP (!) 146/76   Pulse 69   Temp 98.5 F (36.9 C) (Oral)   Ht 5' (1.524 m)   Wt 147 lb 12.8 oz (67 kg)   SpO2 99%   BMI 28.87 kg/m   BP Readings from Last 3 Encounters:  02/13/24 (!) 146/76  02/07/24 126/64  11/26/23 110/68    Wt Readings from Last 3 Encounters:  02/13/24 147 lb 12.8 oz (67 kg)  02/07/24 149 lb 3.2 oz (67.7 kg)  11/26/23 147 lb (66.7 kg)    Physical Exam Constitutional:      General: She is not in acute distress.    Appearance: She is well-developed.  HENT:     Head: Normocephalic.     Right Ear: External ear normal.     Left Ear: External ear normal.     Nose: Nose normal.  Eyes:     General:        Right eye: No discharge.        Left eye: No discharge.     Conjunctiva/sclera: Conjunctivae normal.     Pupils: Pupils are equal, round, and reactive to light.  Neck:     Thyroid : No thyromegaly.     Vascular: No JVD.     Trachea: No tracheal deviation.  Cardiovascular:     Rate and Rhythm: Normal rate and regular rhythm.     Heart sounds: Normal heart sounds.  Pulmonary:     Effort: No respiratory distress.     Breath sounds: No stridor. No wheezing.  Abdominal:     General: Bowel sounds are normal. There is no distension.      Palpations: Abdomen is soft. There is no mass.     Tenderness: There is no abdominal tenderness. There is no guarding or rebound.  Musculoskeletal:        General: Tenderness present.     Cervical back: Normal range of motion and  neck supple. No rigidity.     Right lower leg: No edema.     Left lower leg: No edema.  Lymphadenopathy:     Cervical: No cervical adenopathy.  Skin:    Findings: No erythema or rash.  Neurological:     Cranial Nerves: No cranial nerve deficit.     Motor: No abnormal muscle tone.     Coordination: Coordination normal.     Deep Tendon Reflexes: Reflexes normal.  Psychiatric:        Behavior: Behavior normal.        Thought Content: Thought content normal.        Judgment: Judgment normal.   R rhomboid area is very tender to palpation; R chest wall is slightly tender R shoulder w/mild ROM  Lab Results  Component Value Date   WBC 5.4 11/26/2023   HGB 14.0 11/26/2023   HCT 42.3 11/26/2023   PLT 161.0 11/26/2023   GLUCOSE 85 11/26/2023   CHOL 158 11/26/2023   TRIG 279.0 (H) 11/26/2023   HDL 41.60 11/26/2023   LDLDIRECT 96.0 10/01/2017   LDLCALC 61 11/26/2023   ALT 16 11/26/2023   AST 17 11/26/2023   NA 136 11/26/2023   K 3.9 11/26/2023   CL 106 11/26/2023   CREATININE 0.70 02/11/2024   BUN 26 (H) 11/26/2023   CO2 23 11/26/2023   TSH 2.04 11/26/2023   HGBA1C 5.6 11/26/2023   MICROALBUR <0.7 11/26/2023    CT Chest W Contrast Result Date: 02/11/2024 CLINICAL DATA:  Shortness of breath. Breast cancer. Chest pain. * Tracking Code: BO * EXAM: CT CHEST WITH CONTRAST TECHNIQUE: Multidetector CT imaging of the chest was performed during intravenous contrast administration. RADIATION DOSE REDUCTION: This exam was performed according to the departmental dose-optimization program which includes automated exposure control, adjustment of the mA and/or kV according to patient size and/or use of iterative reconstruction technique. CONTRAST:  75mL OMNIPAQUE   IOHEXOL  300 MG/ML  SOLN COMPARISON:  No comparison studies available. FINDINGS: Cardiovascular: The heart size is normal. No substantial pericardial effusion. No thoracic aortic aneurysm. No substantial atherosclerotic calcification of the thoracic aorta. Mediastinum/Nodes: No mediastinal lymphadenopathy. There is no hilar lymphadenopathy. The esophagus has normal imaging features. There is no axillary lymphadenopathy. Lungs/Pleura: 3 mm left upper lobe perifissural nodule is compatible with a subpleural lymph node. Lungs otherwise clear. Upper Abdomen: Visualized portion of the upper abdomen shows no acute findings. Musculoskeletal: No worrisome lytic or sclerotic osseous abnormality. IMPRESSION: 1. No acute findings in the chest. Specifically, no findings to explain the patient's history of shortness of breath and chest pain. 2. 3 mm left upper lobe perifissural nodule is almost assuredly a benign subpleural lymph node. Given the history of breast cancer, consider follow-up CT chest in 3 months to ensure stability. Electronically Signed   By: Camellia Candle M.D.   On: 02/11/2024 08:18    Assessment & Plan:   Problem List Items Addressed This Visit     Anxiety disorder   D/c Effexor  - problems w/drug interactions Start trintellix       Relevant Medications   vortioxetine  HBr (TRINTELLIX ) 10 MG TABS tablet   B12 deficiency (Chronic)   On B12      Ductal carcinoma in situ of right breast   MRI ordered per Dr Colton sugggestion      Relevant Orders   MR THORACIC SPINE W WO CONTRAST   Rhomboid muscle pain   R side - severe,  pain in the R shoulder, chronic worse  x2 wks, started during  XRT treatment (Dr. Shannon wants to get her an MRI. Patient here for pain management. Pain under right side of arm radiates to her back. Pain started ever since she had radiation done and it was coming and going, but this time it has not stopped. Pain Scale: 8. ) Pain is worse after activity/work.  Pt had a  CT Sports med ref Celebrex  po Perciocet low dose prn D/c Effexor . Start Trintellix       Relevant Medications   oxyCODONE -acetaminophen  (PERCOCET/ROXICET) 5-325 MG tablet   celecoxib  (CELEBREX ) 200 MG capsule   Other Relevant Orders   Ambulatory referral to Sports Medicine   Thoracic back pain - Primary   R side - severe,  pain in the R shoulder, chronic worse  x2 wks, started during  XRT treatment (Dr. Shannon wants you to get her an MRI. Patient here for pain management. Pain under right side of arm radiates to her back. Pain started ever since she had radiation done and it was coming and going, but this time it has not stopped. Pain Scale: 8. ) Pain is worse after activity/work.  Pt had a CT Sports med ref Celebrex  po Perciocet low dose prn D/c Effexor . Start Trintellix       Relevant Medications   oxyCODONE -acetaminophen  (PERCOCET/ROXICET) 5-325 MG tablet   celecoxib  (CELEBREX ) 200 MG capsule   Other Relevant Orders   MR THORACIC SPINE W WO CONTRAST   Ambulatory referral to Sports Medicine   Type 2 diabetes mellitus without complication, without long-term current use of insulin  (HCC)    On Jardiance , Ozempic   -  2 mg/2 wk         Meds ordered this encounter  Medications   vortioxetine  HBr (TRINTELLIX ) 10 MG TABS tablet    Sig: Take 1 tablet (10 mg total) by mouth daily.    Dispense:  30 tablet    Refill:  5   oxyCODONE -acetaminophen  (PERCOCET/ROXICET) 5-325 MG tablet    Sig: Take 1 tablet by mouth every 8 (eight) hours as needed for severe pain (pain score 7-10).    Dispense:  15 tablet    Refill:  0   celecoxib  (CELEBREX ) 200 MG capsule    Sig: Take 1 capsule (200 mg total) by mouth 2 (two) times daily as needed.    Dispense:  60 capsule    Refill:  1      Follow-up: Return in about 4 weeks (around 03/12/2024) for a follow-up visit.  Marolyn Noel, MD

## 2024-02-14 ENCOUNTER — Telehealth: Payer: Self-pay | Admitting: Radiology

## 2024-02-14 NOTE — Telephone Encounter (Signed)
 Copied from CRM [id number]. Topic: Clinical - Medication Question >> Feb 14, 2024  9:08 AM Revonda D wrote: Reason for CRM: Pt stated that she saw Dr.Plotnikov yesterday and was told that she would be contacted to schedule an appt for a steroid shot but hasn't been contacted as of yet. Pt also stated that she needs a PA for the anxiety medication that Dr.Plotnikov stated he would start to prescribed. Pt would like a callback with an update.

## 2024-02-15 ENCOUNTER — Other Ambulatory Visit (HOSPITAL_COMMUNITY): Payer: Self-pay

## 2024-02-15 ENCOUNTER — Ambulatory Visit: Payer: Self-pay | Admitting: Internal Medicine

## 2024-02-15 ENCOUNTER — Telehealth: Payer: Self-pay

## 2024-02-15 ENCOUNTER — Ambulatory Visit
Admission: RE | Admit: 2024-02-15 | Discharge: 2024-02-15 | Disposition: A | Discharge status: Home / Self Care | Source: Ambulatory Visit | Attending: Internal Medicine | Admitting: Internal Medicine

## 2024-02-15 DIAGNOSIS — G8929 Other chronic pain: Secondary | ICD-10-CM

## 2024-02-15 DIAGNOSIS — M47814 Spondylosis without myelopathy or radiculopathy, thoracic region: Secondary | ICD-10-CM | POA: Diagnosis not present

## 2024-02-15 DIAGNOSIS — E113293 Type 2 diabetes mellitus with mild nonproliferative diabetic retinopathy without macular edema, bilateral: Secondary | ICD-10-CM | POA: Diagnosis not present

## 2024-02-15 DIAGNOSIS — H2513 Age-related nuclear cataract, bilateral: Secondary | ICD-10-CM | POA: Diagnosis not present

## 2024-02-15 DIAGNOSIS — D0511 Intraductal carcinoma in situ of right breast: Secondary | ICD-10-CM

## 2024-02-15 DIAGNOSIS — H353111 Nonexudative age-related macular degeneration, right eye, early dry stage: Secondary | ICD-10-CM | POA: Diagnosis not present

## 2024-02-15 LAB — HM DIABETES EYE EXAM

## 2024-02-15 MED ORDER — GADOPICLENOL 0.5 MMOL/ML IV SOLN
7.5000 mL | Freq: Once | INTRAVENOUS | Status: AC | PRN
  Administered 2024-02-15: 7.5 mL via INTRAVENOUS

## 2024-02-15 NOTE — Telephone Encounter (Signed)
 Copied from CRM [id number]. Topic: Clinical - Medication Question >> Feb 14, 2024  9:08 AM Revonda D wrote: Reason for CRM: Pt stated that she saw Dr.Plotnikov yesterday and was told that she would be contacted to schedule an appt for a steroid shot but hasn't been contacted as of yet. Pt also stated that she needs a PA for the anxiety medication that Dr.Plotnikov stated he would start to prescribed. Pt would like a callback with an update. >> Feb 15, 2024  2:39 PM Thersia C wrote: Patient is calling back in to followup regarding steroid shot and PA for medication

## 2024-02-15 NOTE — Telephone Encounter (Signed)
 Returned patients call regarding steroid injection and PA for Trintellix . Patient is scheduled with Sports Medicine, and will wait until her visit with them next Friday, to see if they will give her the steroid injection there instead of her coming back to see Dr.P for the injection. Will reach out to us  if she needs anything else regarding injection. Advised patient I will send a message to the PA team for her medication, and we should have a decision from Insurance on Monday if it is initiated today. Patient gave verbal understanding.

## 2024-02-18 ENCOUNTER — Other Ambulatory Visit (HOSPITAL_COMMUNITY): Payer: Self-pay

## 2024-02-18 ENCOUNTER — Ambulatory Visit: Payer: Self-pay | Admitting: Physical Therapy

## 2024-02-18 ENCOUNTER — Encounter: Admitting: Physical Therapy

## 2024-02-18 NOTE — Telephone Encounter (Signed)
 Pharmacy Patient Advocate Encounter  Received notification from [facility] that Prior Authorization for Trintellix  (formerly Brintellix) 10MG  tablets  has been APPROVED from 02/16/2024 to 02/15/2026. Ran test claim, Copay is $60. This test claim was processed through [facility]- copay amounts may vary at other pharmacies due to pharmacy/plan contracts, or as the patient moves through the different stages of their insurance plan.   PA #/Case ID/Reference #: [id number]

## 2024-02-18 NOTE — Telephone Encounter (Signed)
 Spoke with patient, let her know PA was approved. She got the report of her MRI, wants to know if she needs to keep appointment with Sports Medicine or cancel it? For the back surgeon, she wants to see Dr.Elsner.  Has questions on if her Effexor  will interfere with Celebrex ? She is still kind of hesitant to start Trintellix . Advised patient to let us  know of any side effects or bad reactions to the trintellix  if she begins taking it. She is afraid of some of the potential side effects.

## 2024-02-19 NOTE — Addendum Note (Signed)
 Addended by: Cai Flott V on: 02/19/2024 07:30 AM   Modules accepted: Orders

## 2024-02-19 NOTE — Telephone Encounter (Signed)
 This is the message I sent to [name] -the message was attached to her MRI report.   Is it okay to refer?  Thanks -------------------------------------------  Dear Amy Lang, Your thoracic spine MRI shows that you have a bulging disc on the right side at the level of thoracic spine vertebrae #9-10 putting pressure on the right ventral spinal cord.  This condition could produce pain in the lower thoracic back and chest on the right side.  It is unlikely to be causing pain in the right shoulder and the right upper back.  Please continue with Celebrex .  We should have you see a spine surgeon/orthopedic surgeon for a consultation.  Let me know if you are okay with this plan.  Hope you are feeling some better! Sincerely, AP

## 2024-02-19 NOTE — Telephone Encounter (Signed)
 Please keep the appointment with Sports medicine. I will refer to see Dr. Colon Thank you

## 2024-02-19 NOTE — Telephone Encounter (Signed)
Spoke with patient, gave a verbal understanding °

## 2024-02-21 NOTE — Progress Notes (Unsigned)
 Ben Jackson D.[facility] [address] [place] [address] Phone: [phone]   Assessment and Plan:     1. Chronic bilateral thoracic back pain (Primary) 2. 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  Most consistent with right central disc protrusion at T9-10 with mass effect on right ventral cord causing pain, radicular symptoms, compensatory pain - Reviewed thoracic MRI from 02/15/2024 with patient.  Discussed right sided central disc protrusion at T9-10 with massive cord effect on right ventral cord.  Additional finding of moderate left foraminal narrowing at T7-8 likely representing an incidental finding but could be contributing to back pain - Recommend epidural CSI to right sided T9-10 - Continue HEP and physical therapy - May continue Celebrex  200 mg daily until injection.  Would then use Celebrex  200 mg daily as needed after epidural CSI - Use Tylenol  500 to 1000 mg tablets 2-3 times a day for day-to-day pain relief   Pertinent previous records reviewed include thoracic MRI 02/15/2024   Follow Up: 2 weeks after epidural CSI to review benefit.  Could consider additional epidural if necessary.  Could discuss OMT   Subjective:   I, Madeline Pho, am serving as a Neurosurgeon for Doctor Morene Mace  Chief Complaint: thoracic pain   HPI:   02/22/2024 Patient is a 58 year old female with thoracic back pain. 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 She has a hx of radiation and states that's when the pain started end of November. Did have a CT scan. Pain is thoracic back that radiates up to traps and shoulders. Has done PT . She has been lifting heavy. Pain has decreased as of 3 weeks ago. Decreased ROM. Celebrex  has been helping 200 mg  1x a day.    Relevant Historical Information:  Hypertension, GERD, prediabetes  Additional pertinent review of systems negative.   Current Outpatient Medications:    albuterol  (VENTOLIN  HFA) 108 (90 Base) MCG/ACT inhaler, TAKE 2 PUFFS BY MOUTH EVERY 6 HOURS AS NEEDED FOR WHEEZE OR SHORTNESS OF BREATH, Disp: 54 each, Rfl: 3   ALPRAZolam  (XANAX ) 0.5 MG tablet, Take 1 tablet (0.5 mg total) by mouth 2 (two) times daily as needed for anxiety., Disp: 60 tablet, Rfl: 3   amphetamine -dextroamphetamine  (ADDERALL) 10 MG tablet, Take 1 tablet (10 mg total) by mouth 2 (two) times daily., Disp: 60 tablet, Rfl: 0   aspirin  EC 81 MG tablet, Take 81 mg by mouth every evening., Disp: , Rfl:    celecoxib  (CELEBREX ) 200 MG capsule, Take 1 capsule (200 mg total) by mouth 2 (two) times daily as needed., Disp: 60 capsule, Rfl: 1   cetirizine (ZYRTEC) 10 MG tablet, Take 10 mg by mouth daily., Disp: , Rfl:    Cholecalciferol (VITAMIN D3) 50 MCG (2000 UT) capsule, Take 1 capsule (2,000 Units total) by mouth daily., Disp: 100 capsule, Rfl: 3   cyanocobalamin  (VITAMIN B12) 1000 MCG/ML injection, INJECT 1 ML INTO MUSCLE OR SUBCUTANEOUSLY EVERY 2 WEEKS (LIFETIME), Disp: 6 mL, Rfl: 3   empagliflozin  (JARDIANCE ) 10 MG TABS tablet, Take 1 tablet (10 mg total) by mouth daily., Disp: 90 tablet, Rfl: 3   fluticasone  (FLONASE ) 50 MCG/ACT nasal spray, SPRAY 2 SPRAYS INTO EACH NOSTRIL EVERY DAY, Disp: 48 mL, Rfl: 3   gabapentin  (NEURONTIN ) 100 MG capsule, Take 1-2 capsules (100-200 mg total) by mouth at bedtime. (  Patient taking differently: Take 100-200 mg by mouth daily as needed.), Disp: 180 capsule, Rfl: 1   gabapentin  (NEURONTIN ) 300 MG capsule, Take 1 capsule (300 mg total) by mouth at bedtime., Disp: 30 capsule, Rfl: 1   losartan  (COZAAR ) 50 MG tablet, TAKE 2 TABLETS (100MG ) BY MOUTH EVERY DAY, Disp: 180 tablet, Rfl: 1   Multiple Vitamins-Minerals (PRESERVISION AREDS 2) CAPS, Take 1 capsule by mouth at bedtime., Disp: , Rfl:    Omega-3 Fatty Acids (FISH OIL) 1000 MG CAPS,  Take 1,000 mg by mouth daily., Disp: , Rfl:    ondansetron  (ZOFRAN ) 4 MG tablet, Take 1 tablet (4 mg total) by mouth every 8 (eight) hours as needed for nausea or vomiting., Disp: 20 tablet, Rfl: 1   oxyCODONE -acetaminophen  (PERCOCET/ROXICET) 5-325 MG tablet, Take 1 tablet by mouth every 8 (eight) hours as needed for severe pain (pain score 7-10)., Disp: 15 tablet, Rfl: 0   pantoprazole  (PROTONIX ) 40 MG tablet, Take 1 tablet (40 mg total) by mouth daily., Disp: 90 tablet, Rfl: 3   Probiotic Product (PROBIOTIC ADVANCED PO), Take by mouth daily., Disp: , Rfl:    Semaglutide , 2 MG/DOSE, 8 MG/3ML SOPN, 2 mg sq weekly, Disp: 9 mL, Rfl: 3   SYRINGE-NEEDLE, DISP, 3 ML (BD ECLIPSE SYRINGE) 25G X 1 3 ML MISC, Use sq for B12 inj, Disp: 50 each, Rfl: 3   tamoxifen  (NOLVADEX ) 10 MG tablet, Take 0.5 tablets (5 mg total) by mouth daily., Disp: 90 tablet, Rfl: 1   topiramate  (TOPAMAX ) 200 MG tablet, Take 1 tablet (200 mg total) by mouth daily., Disp: 90 tablet, Rfl: 3   triamcinolone  ointment (KENALOG ) 0.5 %, Apply 1 Application topically 2 (two) times daily., Disp: 30 g, Rfl: 2   vitamin C (ASCORBIC ACID) 500 MG tablet, Take 500 mg by mouth daily., Disp: , Rfl:    vitamin E 1000 UNIT capsule, Take 1,000 Units by mouth daily., Disp: , Rfl:    vortioxetine  HBr (TRINTELLIX ) 10 MG TABS tablet, Take 1 tablet (10 mg total) by mouth daily., Disp: 30 tablet, Rfl: 5   Zinc 50 MG CAPS, Take by mouth., Disp: , Rfl:    Objective:     Vitals:   02/22/24 1321  Pulse: 69  SpO2: 99%  Weight: 150 lb (68 kg)  Height: 5' (1.524 m)      Body mass index is 29.29 kg/m.    Physical Exam:    Gen: Appears well, nad, nontoxic and pleasant Psych: Alert and oriented, appropriate mood and affect Neuro: sensation intact, strength is 5/5 in upper and lower extremities, muscle tone wnl Skin: no susupicious lesions or rashes  Back - Normal skin, Spine with normal alignment and no deformity.     tenderness to T7-T10 vertebral  process palpation.   Right thoracic paraspinous muscles are   tender and without spasm TTP right trapezius, rhomboid Gait normal    Electronically signed by:  Odis Mace D.[facility] 1:53 PM 02/22/24

## 2024-02-22 ENCOUNTER — Ambulatory Visit: Admitting: Sports Medicine

## 2024-02-22 VITALS — HR 69 | Ht 60.0 in | Wt 150.0 lb

## 2024-02-22 DIAGNOSIS — M5124 Other intervertebral disc displacement, thoracic region: Secondary | ICD-10-CM

## 2024-02-22 DIAGNOSIS — M4804 Spinal stenosis, thoracic region: Secondary | ICD-10-CM

## 2024-02-22 DIAGNOSIS — G8929 Other chronic pain: Secondary | ICD-10-CM

## 2024-02-22 NOTE — Patient Instructions (Signed)
 Epidural right T9-10  Continue Celebrex  until injection   Follow up 2 weeks after injection to discuss results

## 2024-02-25 ENCOUNTER — Ambulatory Visit
Admission: RE | Admit: 2024-02-25 | Discharge: 2024-02-25 | Disposition: A | Discharge status: Home / Self Care | Source: Ambulatory Visit | Attending: Adult Health | Admitting: Adult Health

## 2024-02-25 DIAGNOSIS — R928 Other abnormal and inconclusive findings on diagnostic imaging of breast: Secondary | ICD-10-CM | POA: Diagnosis not present

## 2024-02-25 DIAGNOSIS — R92323 Mammographic fibroglandular density, bilateral breasts: Secondary | ICD-10-CM | POA: Diagnosis not present

## 2024-02-25 DIAGNOSIS — D0511 Intraductal carcinoma in situ of right breast: Secondary | ICD-10-CM

## 2024-02-25 NOTE — Telephone Encounter (Signed)
 Amy Lang saw Dr. Leonce for the shot. Trintellix  was prescribed for anxiety by myself on the day of the visit.  Thanks

## 2024-03-03 DIAGNOSIS — M5414 Radiculopathy, thoracic region: Secondary | ICD-10-CM | POA: Diagnosis not present

## 2024-03-03 DIAGNOSIS — E1169 Type 2 diabetes mellitus with other specified complication: Secondary | ICD-10-CM | POA: Diagnosis not present

## 2024-03-03 DIAGNOSIS — Z6828 Body mass index (BMI) 28.0-28.9, adult: Secondary | ICD-10-CM | POA: Diagnosis not present

## 2024-03-10 ENCOUNTER — Inpatient Hospital Stay: Admission: RE | Admit: 2024-03-10 | Source: Ambulatory Visit

## 2024-03-10 ENCOUNTER — Encounter: Admitting: Physical Therapy

## 2024-03-17 ENCOUNTER — Inpatient Hospital Stay: Attending: Hematology and Oncology | Admitting: Hematology and Oncology

## 2024-03-17 VITALS — BP 126/70 | HR 66 | Temp 97.8°F | Resp 18 | Ht 60.0 in | Wt 145.0 lb

## 2024-03-17 DIAGNOSIS — Z923 Personal history of irradiation: Secondary | ICD-10-CM | POA: Diagnosis not present

## 2024-03-17 DIAGNOSIS — Z79810 Long term (current) use of selective estrogen receptor modulators (SERMs): Secondary | ICD-10-CM | POA: Insufficient documentation

## 2024-03-17 DIAGNOSIS — D0511 Intraductal carcinoma in situ of right breast: Secondary | ICD-10-CM | POA: Insufficient documentation

## 2024-03-17 NOTE — Progress Notes (Signed)
 Patient Care Team: Plotnikov, [name], MD as PCP - General Anner Alm ORN, MD as PCP - Cardiology (Cardiology) Arlana Arnt, MD as Attending Physician (Otolaryngology) Mat Browning, MD as Consulting Physician (Obstetrics and Gynecology) Anner Alm ORN, MD as Consulting Physician (Cardiology) Odean Potts, MD as Consulting Physician (Hematology and Oncology)  DIAGNOSIS:  Encounter Diagnosis  Name Primary?   Ductal carcinoma in situ of right breast Yes    SUMMARY OF ONCOLOGIC HISTORY: Oncology History  Ductal carcinoma in situ of right breast  03/28/2023 Surgery   Right lumpectomy: High-grade DCIS with necrosis and calcifications discontinuously involving fibrotic breast tissue, no invasive cancer, margins negative, ER 40%, PR 0%    03/28/2023 Cancer Staging   Staging form: Breast, AJCC 8th Edition - Pathologic stage from 03/28/2023: Stage 0 (pTis (DCIS), pN0, cM0, ER+) - Signed by Crawford Morna Pickle, NP on 09/17/2023 Stage prefix: Initial diagnosis   05/09/2023 - 06/06/2023 Radiation Therapy   Site: Breast, Right Technique: 3D Mode: Photon Dose Per Fraction: 2.67 Gy Prescribed Dose (Delivered / Prescribed): 40.05 Gy / 40.05 Gy Prescribed Fxs (Delivered / Prescribed): 15 / 15   Site: Breast, Right - Boost  Technique: 3D Mode: Photon Dose Per Fraction: 2 Gy Prescribed Dose (Delivered / Prescribed): 10 Gy / 10 Gy Prescribed Fxs (Delivered / Prescribed): 5 / 5   05/2023 -  Anti-estrogen oral therapy   Tamoxifen  x 5 years     CHIEF COMPLIANT: Follow-up on tamoxifen  therapy  HISTORY OF PRESENT ILLNESS:   History of Present Illness Amy Lang is a 58 year old female with breast cancer who presents for follow-up regarding tamoxifen  therapy and back pain management.  She has been on tamoxifen  since January following breast cancer surgery in October of the previous year. 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 She is grateful for being cancer-free. Persistent soreness in the breast occurs, particularly when lying on her side, but there is no significant pain. Her recent mammogram on September 29th showed no new issues, and breast density has decreased from category C to B.  She experiences significant back pain due to a bulging disc at levels nine and ten, with arthritis at levels seven and eight. The pain was severe enough to cause vomiting and collapse. An MRI ruled out broken ribs or a blood clot. 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  She is currently taking tamoxifen  and Celebrex  and has discontinued aspirin .     ALLERGIES:  is allergic to bee pollen, butorphanol, butorphanol tartrate, codeine, codeine sulfate, crestor [rosuvastatin], darvocet [propoxyphene n-acetaminophen ], estrogens, levofloxacin, metformin  and related, penicillamine, penicillins, sulfa antibiotics, and latex.  MEDICATIONS:  Current Outpatient Medications  Medication Sig Dispense Refill   albuterol  (VENTOLIN  HFA) 108 (90 Base) MCG/ACT inhaler TAKE 2 PUFFS BY MOUTH EVERY 6 HOURS AS NEEDED FOR WHEEZE OR SHORTNESS OF BREATH 54 each 3   ALPRAZolam  (XANAX ) 0.5 MG tablet Take 1 tablet (0.5 mg total) by mouth 2 (two) times daily as needed for anxiety. 60 tablet 3   amphetamine -dextroamphetamine  (ADDERALL) 10 MG tablet Take 1 tablet (10 mg total) by mouth 2 (two) times daily. 60 tablet 0   aspirin  EC 81 MG tablet Take 81 mg by mouth every evening.     celecoxib  (CELEBREX ) 200 MG capsule Take 1 capsule (200  mg total) by mouth 2 (two) times daily as needed. 60 capsule 1   cetirizine (ZYRTEC) 10 MG tablet Take 10 mg by mouth daily.     Cholecalciferol (VITAMIN D3) 50 MCG (2000 UT) capsule Take 1 capsule (2,000 Units total) by mouth daily. 100 capsule  3   cyanocobalamin  (VITAMIN B12) 1000 MCG/ML injection INJECT 1 ML INTO MUSCLE OR SUBCUTANEOUSLY EVERY 2 WEEKS (LIFETIME) 6 mL 3   empagliflozin  (JARDIANCE ) 10 MG TABS tablet Take 1 tablet (10 mg total) by mouth daily. 90 tablet 3   fluticasone  (FLONASE ) 50 MCG/ACT nasal spray SPRAY 2 SPRAYS INTO EACH NOSTRIL EVERY DAY 48 mL 3   gabapentin  (NEURONTIN ) 100 MG capsule Take 1-2 capsules (100-200 mg total) by mouth at bedtime. (Patient taking differently: Take 100-200 mg by mouth daily as needed.) 180 capsule 1   gabapentin  (NEURONTIN ) 300 MG capsule Take 1 capsule (300 mg total) by mouth at bedtime. 30 capsule 1   losartan  (COZAAR ) 50 MG tablet TAKE 2 TABLETS (100MG ) BY MOUTH EVERY DAY 180 tablet 1   Multiple Vitamins-Minerals (PRESERVISION AREDS 2) CAPS Take 1 capsule by mouth at bedtime.     Omega-3 Fatty Acids (FISH OIL) 1000 MG CAPS Take 1,000 mg by mouth daily.     ondansetron  (ZOFRAN ) 4 MG tablet Take 1 tablet (4 mg total) by mouth every 8 (eight) hours as needed for nausea or vomiting. 20 tablet 1   oxyCODONE -acetaminophen  (PERCOCET/ROXICET) 5-325 MG tablet Take 1 tablet by mouth every 8 (eight) hours as needed for severe pain (pain score 7-10). 15 tablet 0   pantoprazole  (PROTONIX ) 40 MG tablet Take 1 tablet (40 mg total) by mouth daily. 90 tablet 3   Probiotic Product (PROBIOTIC ADVANCED PO) Take by mouth daily.     Semaglutide , 2 MG/DOSE, 8 MG/3ML SOPN 2 mg sq weekly 9 mL 3   SYRINGE-NEEDLE, DISP, 3 ML (BD ECLIPSE SYRINGE) 25G X 1 3 ML MISC Use sq for B12 inj 50 each 3   tamoxifen  (NOLVADEX ) 10 MG tablet Take 0.5 tablets (5 mg total) by mouth daily. 90 tablet 1   topiramate  (TOPAMAX ) 200 MG tablet Take 1 tablet (200 mg total) by mouth daily. 90 tablet 3   triamcinolone  ointment (KENALOG ) 0.5 % Apply 1 Application topically 2 (two) times daily. 30 g 2   vitamin C (ASCORBIC ACID) 500 MG tablet Take 500 mg by mouth daily.     vitamin E 1000 UNIT capsule Take 1,000 Units by mouth daily.      vortioxetine  HBr (TRINTELLIX ) 10 MG TABS tablet Take 1 tablet (10 mg total) by mouth daily. 30 tablet 5   Zinc 50 MG CAPS Take by mouth.     No current facility-administered medications for this visit.    PHYSICAL EXAMINATION: ECOG PERFORMANCE STATUS: 1 - Symptomatic but completely ambulatory  Vitals:   03/17/24 1054  BP: 126/70  Pulse: 66  Resp: 18  Temp: 97.8 F (36.6 C)  SpO2: 100%   Filed Weights   03/17/24 1054  Weight: 145 lb (65.8 kg)      LABORATORY DATA:  I have reviewed the data as listed    Latest Ref Rng & Units 02/11/2024    7:29 AM 11/26/2023    3:08 PM 03/27/2023   10:33 AM  CMP  Glucose 70 - 99 mg/dL  85  890   BUN 6 - 23 mg/dL  26  14   Creatinine 9.55 - 1.00 mg/dL 9.29  9.33  9.33  Sodium 135 - 145 mEq/L  136  139   Potassium 3.5 - 5.1 mEq/L  3.9  4.4   Chloride 96 - 112 mEq/L  106  109   CO2 19 - 32 mEq/L  23  25   Calcium 8.4 - 10.5 mg/dL  8.9  8.8   Total Protein 6.0 - 8.3 g/dL  7.1    Total Bilirubin 0.2 - 1.2 mg/dL  0.4    Alkaline Phos 39 - 117 U/L  73    AST 0 - 37 U/L  17    ALT 0 - 35 U/L  16      Lab Results  Component Value Date   WBC 5.4 11/26/2023   HGB 14.0 11/26/2023   HCT 42.3 11/26/2023   MCV 91.1 11/26/2023   PLT 161.0 11/26/2023   NEUTROABS 3.2 11/26/2023    ASSESSMENT & PLAN:  Ductal carcinoma in situ of right breast 03/28/2023: Right lumpectomy: High-grade DCIS with necrosis and calcifications discontinuously involving fibrotic breast tissue, no invasive cancer, margins negative, ER 40%, PR 0%  06/06/2023: Completed adjuvant radiation   Treatment plan: Adjuvant antiestrogen therapy with tamoxifen  5 mg daily x 5 years Tamoxifen  toxicities: Moderate hot flashes: She is able to tolerate them and manage them fairly well.  Breast cancer surveillance: Breast exam 03/17/2024: Benign Mammogram 02/25/2024: Benign breast density category B MRI thoracic spine: Right central disc protrusion T9-T10 with mass effect on the  right ventral spinal cord, left foraminal narrowing T7-T8 ------------------------------------- Assessment and Plan Assessment & Plan Breast cancer survivorship post-ductal carcinoma in situ Continues tamoxifen  since January post-surgery. Mammogram on September 29th showed no new issues, decreased breast density from category C to B. No tamoxifen  adherence issues. - Continue tamoxifen  therapy for five years. - Schedule annual follow-up appointments.  Hot flashes secondary to tamoxifen  therapy Experiences daily hot flashes, tolerates well, considers minor issue.  Chronic right breast soreness post-treatment Persistent soreness, no significant pain, recent mammogram showed no new issues.  Back pain with bulging disc and arthritis Severe back pain due to bulging disc and arthritis, managed with Celebrex . MRI confirmed diagnosis. Scheduled for injection, no surgery required. - Administer scheduled injection for pain relief. - Continue Celebrex  for pain management.      No orders of the defined types were placed in this encounter.  The patient has a good understanding of the overall plan. she agrees with it. she will call with any problems that may develop before the next visit here.  I personally spent a total of 30 minutes in the care of the patient today including preparing to see the patient, getting/reviewing separately obtained history, performing a medically appropriate exam/evaluation, counseling and educating, placing orders, referring and communicating with other health care professionals, documenting clinical information in the EHR, independently interpreting results, communicating results, and coordinating care.   Viinay K Yamato Kopf, MD 03/17/24

## 2024-03-17 NOTE — Assessment & Plan Note (Signed)
 03/28/2023: Right lumpectomy: High-grade DCIS with necrosis and calcifications discontinuously involving fibrotic breast tissue, no invasive cancer, margins negative, ER 40%, PR 0%  06/06/2023: Completed adjuvant radiation   Treatment plan: Adjuvant antiestrogen therapy with tamoxifen  5 mg daily x 5 years Tamoxifen  toxicities:  Breast cancer surveillance: Breast exam 03/17/2024: Benign Mammogram 02/25/2024: Benign breast density category B MRI thoracic spine: Right central disc protrusion T9-T10 with mass effect on the right ventral spinal cord, left foraminal narrowing T7-T8

## 2024-03-20 NOTE — Discharge Instructions (Signed)
 Post Procedure Spinal Discharge Instruction Sheet  You may resume a regular diet and any medications that you routinely take (including pain medications) unless otherwise noted by MD.    No driving day of procedure.  Light activity throughout the rest of the day.  Do not do any strenuous work, exercise, bending or lifting.  The day following the procedure, you can resume normal physical activity but you should refrain from exercising or physical therapy for at least three days thereafter.  You may apply ice to the injection site, 20 minutes on, 20 minutes off, as needed. Do not apply ice directly to skin.    Common Side Effects:  Headaches- take your usual medications as directed by your physician.  Increase your fluid intake.  Caffeinated beverages may be helpful.  Lie flat in bed until your headache resolves.  Restlessness or inability to sleep- you may have trouble sleeping for the next few days.  Ask your referring physician if you need any medication for sleep.  Facial flushing or redness- should subside within a few days.  Increased pain- a temporary increase in pain a day or two following your procedure is not unusual.  Take your pain medication as prescribed by your referring physician.  Leg cramps  Please contact our office at [phone] for the following symptoms: Fever greater than 100 degrees. Headaches unresolved with medication after 2-3 days. Increased swelling, pain, or redness at injection site.   Thank you for visiting [facility] today!   You May Resume Your Aspirin  Today

## 2024-03-21 ENCOUNTER — Inpatient Hospital Stay
Admission: RE | Admit: 2024-03-21 | Discharge: 2024-03-21 | Disposition: A | Discharge status: Home / Self Care | Source: Ambulatory Visit | Attending: Sports Medicine | Admitting: Sports Medicine

## 2024-04-09 NOTE — Discharge Instructions (Signed)

## 2024-04-11 ENCOUNTER — Ambulatory Visit
Admission: RE | Admit: 2024-04-11 | Discharge: 2024-04-11 | Disposition: A | Discharge status: Home / Self Care | Source: Ambulatory Visit | Attending: Sports Medicine | Admitting: Sports Medicine

## 2024-04-11 DIAGNOSIS — M5124 Other intervertebral disc displacement, thoracic region: Secondary | ICD-10-CM

## 2024-04-11 DIAGNOSIS — G8929 Other chronic pain: Secondary | ICD-10-CM

## 2024-04-11 DIAGNOSIS — M4804 Spinal stenosis, thoracic region: Secondary | ICD-10-CM

## 2024-04-11 MED ORDER — TRIAMCINOLONE ACETONIDE 40 MG/ML IJ SUSP (RADIOLOGY)
60.0000 mg | Freq: Once | INTRAMUSCULAR | Status: AC
  Administered 2024-04-11: 60 mg via EPIDURAL

## 2024-04-11 MED ORDER — IOPAMIDOL (ISOVUE-M 300) INJECTION 61%
1.0000 mL | Freq: Once | INTRAMUSCULAR | Status: AC
  Administered 2024-04-11: 1 mL via EPIDURAL

## 2024-04-12 ENCOUNTER — Other Ambulatory Visit: Payer: Self-pay | Admitting: Internal Medicine

## 2024-05-06 ENCOUNTER — Other Ambulatory Visit: Payer: Self-pay

## 2024-05-06 ENCOUNTER — Encounter: Payer: Self-pay | Admitting: Internal Medicine

## 2024-05-06 DIAGNOSIS — M5414 Radiculopathy, thoracic region: Secondary | ICD-10-CM | POA: Insufficient documentation

## 2024-06-16 ENCOUNTER — Other Ambulatory Visit: Payer: Self-pay | Admitting: Internal Medicine

## 2025-03-23 ENCOUNTER — Inpatient Hospital Stay: Admitting: Hematology and Oncology
